# Patient Record
Sex: Female | Born: 1935
Health system: Southern US, Community
[De-identification: ages and names within clinical notes are randomized; demographics above are authoritative.]

## PROBLEM LIST (undated history)

## (undated) DIAGNOSIS — C50412 Malignant neoplasm of upper-outer quadrant of left female breast: Secondary | ICD-10-CM

## (undated) DIAGNOSIS — C50919 Malignant neoplasm of unspecified site of unspecified female breast: Secondary | ICD-10-CM

## (undated) DIAGNOSIS — Z923 Personal history of irradiation: Secondary | ICD-10-CM

## (undated) DIAGNOSIS — H3552 Pigmentary retinal dystrophy: Secondary | ICD-10-CM

## (undated) DIAGNOSIS — I2510 Atherosclerotic heart disease of native coronary artery without angina pectoris: Secondary | ICD-10-CM

## (undated) DIAGNOSIS — R7303 Prediabetes: Secondary | ICD-10-CM

## (undated) DIAGNOSIS — Z951 Presence of aortocoronary bypass graft: Secondary | ICD-10-CM

## (undated) DIAGNOSIS — I341 Nonrheumatic mitral (valve) prolapse: Secondary | ICD-10-CM

## (undated) DIAGNOSIS — H548 Legal blindness, as defined in USA: Secondary | ICD-10-CM

## (undated) DIAGNOSIS — C801 Malignant (primary) neoplasm, unspecified: Secondary | ICD-10-CM

## (undated) DIAGNOSIS — E785 Hyperlipidemia, unspecified: Secondary | ICD-10-CM

## (undated) DIAGNOSIS — I10 Essential (primary) hypertension: Secondary | ICD-10-CM

## (undated) HISTORY — DX: Nonrheumatic mitral (valve) prolapse: I34.1

## (undated) HISTORY — DX: Pigmentary retinal dystrophy: H35.52

## (undated) HISTORY — DX: Essential (primary) hypertension: I10

## (undated) HISTORY — DX: Hyperlipidemia, unspecified: E78.5

## (undated) HISTORY — DX: Personal history of irradiation: Z92.3

## (undated) HISTORY — DX: Presence of aortocoronary bypass graft: Z95.1

## (undated) HISTORY — PX: BREAST SURGERY: SHX581

## (undated) HISTORY — DX: Malignant neoplasm of upper-outer quadrant of left female breast: C50.412

## (undated) HISTORY — DX: Atherosclerotic heart disease of native coronary artery without angina pectoris: I25.10

## (undated) HISTORY — PX: CARDIAC SURGERY: SHX584

## (undated) HISTORY — PX: DOPPLER ECHOCARDIOGRAPHY: SHX263

## (undated) HISTORY — PX: BRAIN SURGERY: SHX531

## (undated) HISTORY — PX: TRANSTHORACIC ECHOCARDIOGRAM: SHX275

---

## 2002-04-27 DIAGNOSIS — Z951 Presence of aortocoronary bypass graft: Secondary | ICD-10-CM

## 2002-04-27 DIAGNOSIS — I2510 Atherosclerotic heart disease of native coronary artery without angina pectoris: Secondary | ICD-10-CM

## 2002-04-27 HISTORY — DX: Presence of aortocoronary bypass graft: Z95.1

## 2002-04-27 HISTORY — DX: Atherosclerotic heart disease of native coronary artery without angina pectoris: I25.10

## 2002-07-21 HISTORY — PX: CORONARY ARTERY BYPASS GRAFT: SHX141

## 2003-08-28 ENCOUNTER — Emergency Department (HOSPITAL_COMMUNITY): Admission: EM | Admit: 2003-08-28 | Discharge: 2003-08-28 | Payer: Self-pay | Admitting: *Deleted

## 2003-10-04 ENCOUNTER — Emergency Department (HOSPITAL_COMMUNITY): Admission: EM | Admit: 2003-10-04 | Discharge: 2003-10-04 | Payer: Self-pay | Admitting: Emergency Medicine

## 2004-02-13 ENCOUNTER — Encounter: Admission: RE | Admit: 2004-02-13 | Discharge: 2004-02-13 | Payer: Self-pay | Admitting: Internal Medicine

## 2004-05-04 ENCOUNTER — Inpatient Hospital Stay (HOSPITAL_COMMUNITY): Admission: EM | Admit: 2004-05-04 | Discharge: 2004-05-05 | Payer: Self-pay | Admitting: Emergency Medicine

## 2004-09-26 ENCOUNTER — Encounter: Admission: RE | Admit: 2004-09-26 | Discharge: 2004-09-26 | Payer: Self-pay | Admitting: Internal Medicine

## 2004-10-01 ENCOUNTER — Encounter: Admission: RE | Admit: 2004-10-01 | Discharge: 2004-10-01 | Payer: Self-pay | Admitting: Internal Medicine

## 2004-10-03 ENCOUNTER — Encounter: Admission: RE | Admit: 2004-10-03 | Discharge: 2004-10-03 | Payer: Self-pay | Admitting: Otolaryngology

## 2005-01-12 ENCOUNTER — Encounter: Admission: RE | Admit: 2005-01-12 | Discharge: 2005-01-12 | Payer: Self-pay | Admitting: Surgery

## 2005-01-14 ENCOUNTER — Ambulatory Visit (HOSPITAL_BASED_OUTPATIENT_CLINIC_OR_DEPARTMENT_OTHER): Admission: RE | Admit: 2005-01-14 | Discharge: 2005-01-14 | Payer: Self-pay | Admitting: Surgery

## 2005-01-14 ENCOUNTER — Ambulatory Visit (HOSPITAL_COMMUNITY): Admission: RE | Admit: 2005-01-14 | Discharge: 2005-01-14 | Payer: Self-pay | Admitting: Surgery

## 2005-01-14 ENCOUNTER — Encounter (INDEPENDENT_AMBULATORY_CARE_PROVIDER_SITE_OTHER): Payer: Self-pay | Admitting: Specialist

## 2005-01-26 ENCOUNTER — Inpatient Hospital Stay (HOSPITAL_COMMUNITY): Admission: EM | Admit: 2005-01-26 | Discharge: 2005-01-31 | Payer: Self-pay | Admitting: Emergency Medicine

## 2005-01-29 ENCOUNTER — Encounter (INDEPENDENT_AMBULATORY_CARE_PROVIDER_SITE_OTHER): Payer: Self-pay | Admitting: Specialist

## 2005-02-05 HISTORY — PX: CHOLECYSTECTOMY: SHX55

## 2005-04-30 ENCOUNTER — Ambulatory Visit: Payer: Self-pay | Admitting: Internal Medicine

## 2005-05-26 ENCOUNTER — Encounter: Admission: RE | Admit: 2005-05-26 | Discharge: 2005-08-24 | Payer: Self-pay | Admitting: Internal Medicine

## 2005-09-14 ENCOUNTER — Encounter: Admission: RE | Admit: 2005-09-14 | Discharge: 2005-09-14 | Payer: Self-pay | Admitting: Internal Medicine

## 2005-10-22 ENCOUNTER — Encounter: Admission: RE | Admit: 2005-10-22 | Discharge: 2005-10-22 | Payer: Self-pay | Admitting: Internal Medicine

## 2006-01-06 ENCOUNTER — Encounter: Admission: RE | Admit: 2006-01-06 | Discharge: 2006-01-06 | Payer: Self-pay | Admitting: Internal Medicine

## 2006-01-08 ENCOUNTER — Ambulatory Visit: Payer: Self-pay | Admitting: Internal Medicine

## 2006-01-21 ENCOUNTER — Encounter: Admission: RE | Admit: 2006-01-21 | Discharge: 2006-01-21 | Payer: Self-pay | Admitting: Internal Medicine

## 2006-02-19 ENCOUNTER — Ambulatory Visit: Payer: Self-pay | Admitting: Internal Medicine

## 2006-03-11 ENCOUNTER — Observation Stay (HOSPITAL_COMMUNITY): Admission: EM | Admit: 2006-03-11 | Discharge: 2006-03-12 | Payer: Self-pay | Admitting: Emergency Medicine

## 2006-03-11 ENCOUNTER — Encounter (INDEPENDENT_AMBULATORY_CARE_PROVIDER_SITE_OTHER): Payer: Self-pay | Admitting: *Deleted

## 2006-11-19 ENCOUNTER — Encounter (INDEPENDENT_AMBULATORY_CARE_PROVIDER_SITE_OTHER): Payer: Self-pay | Admitting: *Deleted

## 2006-11-19 ENCOUNTER — Ambulatory Visit (HOSPITAL_COMMUNITY): Admission: RE | Admit: 2006-11-19 | Discharge: 2006-11-19 | Payer: Self-pay | Admitting: *Deleted

## 2007-02-16 ENCOUNTER — Encounter: Admission: RE | Admit: 2007-02-16 | Discharge: 2007-02-16 | Payer: Self-pay | Admitting: Internal Medicine

## 2007-02-22 ENCOUNTER — Encounter: Admission: RE | Admit: 2007-02-22 | Discharge: 2007-02-22 | Payer: Self-pay | Admitting: Internal Medicine

## 2008-08-07 ENCOUNTER — Ambulatory Visit (HOSPITAL_COMMUNITY): Admission: RE | Admit: 2008-08-07 | Discharge: 2008-08-07 | Payer: Self-pay | Admitting: *Deleted

## 2009-03-29 ENCOUNTER — Encounter: Admission: RE | Admit: 2009-03-29 | Discharge: 2009-03-29 | Payer: Self-pay | Admitting: Internal Medicine

## 2009-04-27 HISTORY — PX: NM MYOCAR PERF WALL MOTION: HXRAD629

## 2009-05-08 ENCOUNTER — Encounter: Admission: RE | Admit: 2009-05-08 | Discharge: 2009-05-08 | Payer: Self-pay | Admitting: Cardiology

## 2009-05-22 ENCOUNTER — Ambulatory Visit (HOSPITAL_COMMUNITY): Admission: RE | Admit: 2009-05-22 | Discharge: 2009-05-22 | Payer: Self-pay | Admitting: Cardiology

## 2009-05-22 HISTORY — PX: CARDIAC CATHETERIZATION: SHX172

## 2010-05-18 ENCOUNTER — Encounter: Payer: Self-pay | Admitting: Internal Medicine

## 2010-05-18 ENCOUNTER — Encounter: Payer: Self-pay | Admitting: Cardiology

## 2010-09-09 NOTE — Op Note (Signed)
NAMEALIZZA, SACRA                 ACCOUNT NO.:  [id number]   MEDICAL RECORD NO.:  [id number]          PATIENT TYPE:  AMB   LOCATION:  ENDO                         FACILITY:  [facility]   PHYSICIAN:  Georgiana Spinner, M.D.    DATE OF BIRTH:  1932-08-15   DATE OF PROCEDURE:  08/07/2008  DATE OF DISCHARGE:                               OPERATIVE REPORT   PROCEDURE:  Colonoscopy.   INDICATIONS:  Constipation, colon cancer screening.   ANESTHESIA:  Fentanyl 100 mcg, Versed 10 mg.   PROCEDURE:  With the patient mildly sedated in the left lateral  decubitus position, the Pentax videoscopic pediatric colonoscope was  inserted in the rectum and passed under direct vision to the cecum,  identified by ileocecal valve and appendiceal orifice both of which were  photographed.  From this point colonoscope was slowly withdrawn taking  circumferential views of colonic mucosa stopping in the rectum which  appeared normal on direct and showed hemorrhoids on retroflexed view.  The endoscope was straightened and withdrawn.  The patient's vital  signs, pulse oximeter remained stable.  The patient tolerated procedure  well without apparent complications.   FINDINGS:  Internal hemorrhoids otherwise unremarkable colonoscopic  examination to cecum.   PLAN:  Consider repeat examination in 5-10 years.           ______________________________  Georgiana Spinner, M.D.     GMO/MEDQ  D:  08/07/2008  T:  08/07/2008  Job:  [id number]

## 2010-09-09 NOTE — Op Note (Signed)
NAMEFELICITE, Olivia Gray                 ACCOUNT NO.:  [id number]   MEDICAL RECORD NO.:  [id number]          PATIENT TYPE:  AMB   LOCATION:  ENDO                         FACILITY:  [facility]   PHYSICIAN:  Georgiana Spinner, M.D.    DATE OF BIRTH:  1932/09/03   DATE OF PROCEDURE:  11/19/2006  DATE OF DISCHARGE:                               OPERATIVE REPORT   PROCEDURE:  Upper endoscopy with biopsy.   INDICATIONS:  Gastroesophageal reflux disease.   ANESTHESIA:  Fentanyl 50 mcg, Versed 4 mg.   PROCEDURE IN DETAIL:  With the patient mildly sedated in the left  lateral decubitus position, the Pentax videoscopic endoscope was  inserted in the mouth and passed under direct vision through the  esophagus which appeared normal except at the squamocolumnar junction  there was an area of esophagitis, small, that was photographed and  biopsied.  We entered into stomach.  The fundus, body, antrum, duodenal  bulb, and second portion of the duodenum were visualized.  From this  point, the endoscope was slowly withdrawn taking circumferential views  of the duodenal mucosa until the endoscope had been pulled back in the  stomach and placed in retroflexion to view the stomach from below and an  incomplete wrap of the GE junction was seen.  The endoscope was  straightened and withdrawn taking circumferential views of the remaining  gastric and esophageal mucosa.  The patient's vital signs and pulse  oximeter remained stable.  The patient tolerated the procedure well  without apparent complications.   FINDINGS:  Loose wrap of the GE junction indicating laxity of the lower  esophageal sphincter and a small amount of esophagitis, biopsied.  Await  biopsy report.  The patient will call me for results and follow-up with  me as an outpatient.           ______________________________  Georgiana Spinner, M.D.     GMO/MEDQ  D:  11/19/2006  T:  11/19/2006  Job:  [id number]

## 2010-09-12 NOTE — Consult Note (Signed)
NAMEALLEXUS, OVENS                 ACCOUNT NO.:  [id number]   MEDICAL RECORD NO.:  [id number]          PATIENT TYPE:  INP   LOCATION:  1830                         FACILITY:  [facility]   PHYSICIAN:  Shirley Friar, MDDATE OF BIRTH:  October 01, 1932   DATE OF CONSULTATION:  01/26/2005  DATE OF DISCHARGE:                                   CONSULTATION   REFERRING PHYSICIAN:  Dr. Brien Few   REASON FOR CONSULT:  Acute pancreatitis.   HISTORY OF PRESENT ILLNESS:  The patient is a 77 year old black female who  has past medical history of hypertension, hyperlipidemia, coronary artery  disease, who was in her usual state of health until last Wednesday when she  reports having acute, sharp, generalized abdominal pain with associated  nausea and vomiting. She states that the pain would at times come and go and  at other times be severe and constant. She vomited several times that  initial day, and then yesterday she vomited five times approximately 2 hours  after eating a small amount of fluid. She reports going to her regular  doctor last week and being placed on Milk of Magnesia. She had no  improvement in her pain and came into the emergency room last night. She  denies hematemesis, melena, hematochezia, coffee-ground emesis, fevers, or  diarrhea. She denies ever having this type of pain before and denies family  history of pancreatitis.   In the emergency room, she was afebrile on presentation with white blood  count of 9. Her total bilirubin was 3.9, ALP 523, AST 923, and ALT 927, with  an amylase of 2465. She had a CAT scan done with preliminary report showing  mild pancreatitis and gallbladder wall thickening without ductal dilatation.  The patient denies previous history of gallstones and she denies alcohol  use. Also, the patient denies any new medicines except being placed on a  pain medicine which she is not sure of the name of.   PAST MEDICAL HISTORY:  1.  Coronary artery disease.  2.   Hyperlipidemia.  3.  Hypertension.  4.  Status post coronary artery bypass graft in 2004.  5.  History of blindness.  6.  Glaucoma and retinitis pigmentosa.  7.  History of internal carotid stenosis.  8.  History of an intraductal papilloma of the breast removed in 2006.   MEDICINES:  1.  Aspirin 325 mg daily.  2.  Lisinopril 20 mg daily.  3.  Zocor 40 mg daily.  4.  Cartia XT.  5.  Cosopt eyedrops.  6.  Alphagan eyedrops.  7.  Pain medicines (unsure of name).  8.  Vitamin A daily.   ALLERGIES:  No known drug allergies.   FAMILY HISTORY:  Denies family history of pancreatitis.   SOCIAL HISTORY:  Denies tobacco, alcohol, or drugs. Married.   PHYSICAL EXAMINATION:  VITAL SIGNS:  Temperature 98, pulse 69, blood  pressure 141/68, O2 saturation is 96% on room air.  GENERAL:  Alert and oriented, no acute distress, pleasant.  HEENT:  Nonicteric sclerae, oropharynx clear.  CHEST:  Clear to auscultation bilaterally.  CARDIOVASCULAR:  Regular rate and rhythm without murmurs.  ABDOMEN:  Epigastric tenderness and right upper quadrant; otherwise,  nontender, positive guarding in epigastric region, soft, nondistended,  positive bowel sounds.  EXTREMITIES:  No edema.   LABORATORY DATA:  CBC:  White blood count 9.2, hemoglobin 12.9, hematocrit  37.9, platelet count 252. INR 1.0. Sodium 139, potassium 3.6, chloride 106,  CO2 24, BUN 7, creatinine 1.0, glucose 190. Total bilirubin 3.9, ALP 523,  AST 923, ALT 927, total protein 7.5, calcium 3.3, amylase 2465, lipase 3534.  Cardiac enzymes:  Troponin 0.06, CK 66, CK-MB 1.   ASSESSMENT:  A 77 year old black female with past medical history as stated  above presents with acute pancreatitis that is concerning for gallstone  and/or biliary tract pancreatitis. She had two Ranson's criteria on  admission with her age - being over 3, and AST being greater than 250  consistent with mild pancreatitis. She is currently hemodynamically stable.   Other etiologies for this acute pancreatitis include hyperlipidemia,  viruses, drugs (medicines, especially she could have had this from the pain  medicines that she was on since NSAIDs have been associated with acute  pancreatitis; unsure at this time which medicine she was on, she is not  aware of the names), genetic mutations although this would be rare, viruses.  Pancreatic divisum is also a possibility. In order to further evaluate her  pancreatitis, I would recommend an MRCP to evaluate for CBD stones since  this is less invasive compared to ERCP to look for CBD stones, and if CBD  stones are present then we may need to consider earlier ERCP. Also, she  needs a dedicated right upper quadrant ultrasound to evaluate her  gallbladder for stones to help with further management and possible surgery  of her gallbladder. In the meantime, I recommend the following:  1.  Aggressive hydration and bowel rest.  2.  MRCP to evaluate for CBD stones as stated above.  3.  Right upper quadrant ultrasound to evaluate for cholelithiasis since it      is highly likely that she has gallstones and ultrasound is a better test      to look for this over the CAT scan. Would recommend this at some point      during this hospitalization.  4.  Check lipids fasting.  5.  Obtain complete medical history in terms of the pain medicines she      reports being placed on.  6.  Genetic mutations such as the SPINK gene has been associated with severe      acute pancreatitis, although would do this as a diagnosis of exclusion      if all other etiologies are negative and if she has a recurrent episode.   Thank you for this consult and please do not hesitate to call me with any  additional questions, and we will follow closely along with you.      Shirley Friar, MD  Electronically Signed     VCS/MEDQ  D:  01/26/2005  T:  01/26/2005  Job:  [id number]

## 2010-09-12 NOTE — H&P (Signed)
Olivia Gray, Olivia Gray                 ACCOUNT NO.:  [id number]   MEDICAL RECORD NO.:  [id number]          PATIENT TYPE:  INP   LOCATION:  1828                         FACILITY:  [facility]   PHYSICIAN:  Danae Chen, M.D.DATE OF BIRTH:  January 21, 1933   DATE OF ADMISSION:  05/04/2004  DATE OF DISCHARGE:                                HISTORY & PHYSICAL   CHIEF COMPLAINT:  Chest pain.   HISTORY OF PRESENT ILLNESS:  The patient is a very pleasant 77 year old  African-American female with a known history of coronary artery disease  status post bypass surgery in March of 2004 in [place], who presents  today with intermittent substernal chest pain, sharp in nature which began  at rest this afternoon. She did report that it radiated somewhat to her mid  back but did not to her arm or her jaw. Was not associated with nausea,  vomiting, or diaphoresis. The patient reports that when she initially had  her bypass, her symptoms prior to being worked up were fatigue and lethargy  and did not have any overt signs of chest pain that had prompted her  evaluation at that time. She does have high cholesterol but is not diabetic  and does not smoke. Family history is significant for a father that had a  myocardial infarction at age 81. She is currently pain free in the emergency  department. Initial set of enzymes are negative and there are no significant  electrocardiogram changes consistent with ischemia.   PAST MEDICAL HISTORY:  Hypertension, dyslipidemia, coronary artery disease  status post coronary artery bypass grafting in March 2004.   PAST SURGICAL HISTORY:  As above, coronary artery bypass grafting,  catheterization.   MEDICATIONS:  Zocor, baby aspirin, Lisinopril, Cardizem (uncertain of the  exact dosages, did not bring her medicine bottles with her).   ALLERGIES:  No known drug allergies.   REVIEW OF SYSTEMS:  Per the history of present illness. Also no recent  weight gain or  weight loss. No nausea, vomiting, diarrhea. No fever, chills,  cough, shortness of breath. Positive for chest pain.   FAMILY HISTORY:  Father with myocardial infarction and known coronary artery  disease at age 40.   SOCIAL HISTORY:  She does not smoke. No alcohol. No drugs. 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 She moved down here with her husband  from [place] about a year and a half ago. She has family in the area. She has  2 grown children.   PHYSICAL EXAMINATION:  VITAL SIGNS:  Temperature 97.8, blood pressure  144/73, pulse 72, respiratory rate 18. O2 saturation is 98% on room air.  HEENT:  Oropharynx is clear. Pupils are equal and reactive.  NECK:  Supple. There are no bruits appreciated. There is no jugular venous  distention.  LUNGS:  Clear to auscultation bilaterally.  HEART:  Rate is regular. Normal S1 and S2. No murmurs.  ABDOMEN:  Soft with active bowel sounds.  EXTREMITIES:  She has 2+ femoral pulses. 2+ dorsalis pedis pulses. She has  no peripheral edema. Extremities are  warm.  NEUROLOGIC:  No focal neurological deficits are noted. She is alert and  oriented.   LABORATORY DATA:  Sodium 141, potassium 3.7, chloride 114, BUN 10, glucose  84, hemoglobin 13.6, creatinine 0.9. Initial myoglobin, CK MB and troponin  first two sets are negative.   Portable chest x-ray showing minimal left lower lobe atelectasis. No edema.  No cardiomegaly.   Electrocardiogram:  No changes consistent with ischemia. No ST segment  elevation depression.   ASSESSMENT/PLAN:  A 77 year old African-American female with known coronary  artery disease who presents with atypical chest pain, not consistent with  angina but given her symptoms and risk factors, she will be admitted for  observation and further evaluation. I will continue with her cardiac enzymes  and place her on aspirin, beta blocker, oxygen, ace inhibitor and Lovenox.  Will continue her statin drug and also add a  proton pump inhibitor. Will go  ahead and schedule carotid Doppler's for in the morning, as I suspect in  talking with the patient, she had reported that Dr. Lucas Mallow, her cardiologist,  had ordered this but was unable to get it obtained because of some  difficulty with arranging it with the technician. I also suspect that she  will get a Cardiolite for further risk verification to evaluate for the  patency of her bypass. Cardiology has been called and are aware and will  come to see her in the morning. Further management per her clinical course.      RLK/MEDQ  D:  05/04/2004  T:  05/04/2004  Job:  [id number]   cc:   Juline Patch, M.D.  [address]  [place], [place] [address]  Fax: [phone]   Jaclyn Prime. Lucas Mallow, M.D.  [address]  [place]  [place] [address]  Fax: [phone]

## 2010-09-12 NOTE — H&P (Signed)
NAMEJELITZA, Olivia Gray                 ACCOUNT NO.:  [id number]   MEDICAL RECORD NO.:  [id number]          PATIENT TYPE:  INP   LOCATION:  1827                         FACILITY:  [facility]   PHYSICIAN:  Della Goo, M.D. DATE OF BIRTH:  [birth year]   DATE OF ADMISSION:  03/11/2006  DATE OF DISCHARGE:                                HISTORY & PHYSICAL   CHIEF COMPLAINT:  Chest pain.   HISTORY OF PRESENT ILLNESS:  This is a 77 year old female with a history of  coronary artery disease status post CABG x5 in 2004 who also has a history  of type 2 diabetes mellitus who presented to the emergency department  secondary to complaints of intermittent substernal chest pain off and on for  the past few days.  The patient reports the pain was lasting approximately a  few minutes and recurring every few hours.  The pain was described as being  a tightness in her chest, substernal in location, rated at an intensity of  6/10, nonradiating, and not associated with symptoms of nausea, vomiting or  diaphoresis or shortness of breath.  The patient did report the pain did  improve with changes in her position.   PAST MEDICAL HISTORY:  1. Coronary artery disease.  2. Coronary coronary artery bypass grafting x5.  3. Diabetes type 2.  4. Hyperlipidemia.  5. Hypertension.  6. Legal blindness secondary to retinitis pigmentosum.   MEDICATIONS:  The dosages need to be verified.  1. Lisinopril 20 mg one p.o. daily.  2. Simvastatin 40 mg one p.o. daily.  3. Cartia XT 180 mg one p.o. daily.  4. Metformin, dose to be verified.  5. Travatan eye drops, dose to be verified and interval      dosing/administration.  6. The patient was previously on Reglan; this has been discontinued.   ALLERGIES:  NO KNOWN DRUG ALLERGIES.   SOCIAL HISTORY:  Married, nonsmoker, nondrinker.  The patient is also a  [employer] and will not take blood or blood products.   REVIEW OF SYSTEMS:  Pertinents are mentioned  above.   PHYSICAL EXAMINATION:  GENERAL:  A pleasant, pleasant 77 year old well-  nourished, well-developed female in no acute distress.  VITAL SIGNS:  Temperature 98.1, blood pressure 130/68, heart rate 57-68 and  respirations 20-22.  O2 saturation is 99-100%.  HEENT:  Normocephalic and atraumatic.  Pupils are equally round and reactive  to light.  Extraocular muscles are intact.  Oropharynx is clear.  NECK:  Supple.  Full range of motion.  No thyromegaly, adenopathy, or  jugular venous distension.  CARDIOVASCULAR:  Regular rate and rhythm.  No murmurs, gallops or rubs.  LUNGS:  Clear to auscultation bilaterally.  ABDOMEN:  Positive bowel sounds, soft, nontender, nondistended.  EXTREMITIES:  Without cyanosis, clubbing or edema.  NEUROLOGIC:  Grossly nonfocal except for the visual deficits.  GENITOURINARY/RECTAL:  Deferred.   LABORATORY STUDIES:  White blood cell count 5.6, hemoglobin 12.3, hematocrit  36.2, platelets 234, neutrophils 48%, lymphocytes 38%.  Sodium 141,  potassium 3.9, chloride 111, BUN 12, creatinine 1.0, bicarbonate 25.8 and  glucose 100.  Initial cardiac enzymes revealed myoglobin of 75.7, CK-MB 1.2,  troponin less than 0.01.   ELECTROCARDIOGRAM:  Normal sinus rhythm with no acute ST-segment changes.   CHEST X-RAY:  No acute disease.   ASSESSMENT:  A 77 year old female with a history of coronary artery disease  and type 2 diabetes mellitus admitted with complaints of chest pain.   ADMITTING DIAGNOSES:  1. Acute chest pain syndrome.  2. Hypertension.  3. Hyperlipidemia.  4. Type 2 diabetes mellitus.  5. History of retinitis pigmentosum and legal blindness.   PLAN:  The patient will be admitted to a telemetry area for a cardiac rule  out.  She will be monitored for cardiac changes.  She will be placed on beta  blocker therapy, nitrates, oxygen, aspirin, ACE inhibitor and Lovenox  therapy.  She will also be placed on GI prophylaxis.  The patient's  metformin  and Travatan medications need dosage verification.  The patient's  regular physician is Dr. Ricki Miller and cardiologist is Dr. Lucas Mallow.      Della Goo, M.D.  Electronically Signed     HJ/MEDQ  D:  03/11/2006  T:  03/11/2006  Job:  [id number]   cc:   Juline Patch, M.D.  Jaclyn Prime. Lucas Mallow, M.D.

## 2010-09-12 NOTE — H&P (Signed)
NAMEFABIHA, Olivia Gray                 ACCOUNT NO.:  [id number]   MEDICAL RECORD NO.:  [id number]          PATIENT TYPE:  INP   LOCATION:  1830                         FACILITY:  [facility]   PHYSICIAN:  Isidor Holts, M.D.  DATE OF BIRTH:  1933-02-15   DATE OF ADMISSION:  01/25/2005  DATE OF DISCHARGE:                                HISTORY & PHYSICAL   PRIMARY CARE PHYSICIAN:  Juline Patch, M.D.   CHIEF COMPLAINT:  Abdominal pain/vomiting for 5 days.   HISTORY OF PRESENT ILLNESS:  This is a 76 year old female.  For past medical  history, see below.  She developed upper abdominal pain on January 21, 2005.  There were no obvious precipitants, and she vomited x2 on that day.  Since then, abdominal pain has been severe, persistent, and fluctuating.  No  appetite.  Denies fever.  Went to see her PMD on January 22, 2005, had an  abdominal x-ray and was prescribed Milk of Magnesia.  She had frequent  stools on January 23, 2005, and on January 24, 2005.  There was no  improvement in pain.  She vomited x4 on January 25, 2005.  The vomitus  contained food debris.  No coffee grounds, no hematemesis.  Spouse called  emergency medical services.   PAST MEDICAL HISTORY:  1.  Hypertension.  2.  Dyslipidemia.  3.  Coronary artery disease; status post CABG on March 2004.  4.  Glaucoma/retinitis pigmentosa/bilateral blindness.  5.  Left 40% to 60% internal carotid artery stenosis confirmed by Doppler on      April 28, 2004.  6.  Status post bilateral ductal excision, January 14, 2005, by Currie Paris, M.D. for intraduct papilloma.   MEDICATIONS:  1.  Aspirin 325 mg p.o. daily.  2.  Lisinopril 20 mg p.o. daily.  3.  Zocor 40 mg p.o. daily.  4.  Vitamin A 1600 International Units daily.  5.  Cartia XT. Query dosage.  6.  Nutraview.  7.  Cosopt eye drops, one drop each eye b.i.d.  8.  Travatan eye drops, one drop each eye nightly.  9.  Alphagan eye drops, one drop each eye  b.i.d.  10. Unspecified pain medications.   ALLERGIES:  No known drug allergies.   REVIEW OF SYSTEMS:  As in HPI/Chief complaint, otherwise negative.   FAMILY HISTORY:  Noncontributary.   SOCIAL HISTORY:  The patient is married.  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  She  is a nonsmoker, nondrinker, and has no history of drug abuse.  She has two  grown children.   PHYSICAL EXAMINATION:  VITAL SIGNS:  Temperature 97.9, pulse 69 per minute  and regular, respiratory rate 16, blood pressure initially 152/70 mmHg,  rechecked 141/68 mmHg.  Pulse oximetry 96% on room air.  GENERAL:  The patient does not appear to be in obvious acute distress, after  Dilaudid was administered in the emergency department.  She is not short of  breath at rest.  She is alert  and communicative.  HEENT:  No frank pallor, no jaundice.  No conjunctival injection.  She does  appear to have early cataracts bilaterally.  Throat appears quite clear.  NECK:  Supple, JVP not seen.  No palpable lymphadenopathy.  No palpable  goiter.  CHEST:  Clear to auscultation, no wheezes and no crackles.  HEART:  Sounds 1 and 2 heard, normal regular, no murmurs.  Median sternotomy  scar is noted.  BREASTS:  Reveals healed irregular scars.  There is no discharge or redness.  ABDOMEN:  Mildly obese, soft, there is right upper quadrant and epigastric  tenderness, no guarding.  No palpable organomegaly.  Normal bowel sounds.  EXTREMITIES:  No pitting edema, palpable peripheral pulses.  MUSCULOSKELETAL:  Quite unremarkable.  NEUROLOGY:  No focal neurologic deficit on gross examination.   LABORATORY DATA:  CBC; white blood cell count 9.3, hemoglobin 12.9,  hematocrit 37.9, platelets 252.  Electrolytes; sodium 139, potassium 3.6,  chloride 106, CO2 24, BUN 7, creatinine 1.0, glucose 190, AST 923, ALT 927,  alkaline phosphatase 523, total bilirubin 3.9.  Urinalysis is  negative.  Amylase is 2465.   Chest x-ray dated January 25, 2005; no acute findings, normal bowel gas  pattern, no free air.   CT of abdomen/pelvis, January 25, 2005; mild pancreatitis.  There is  gallbladder wall thickening, indeterminate 1.8 x 2.4 cm right adrenal  lesion.   ASSESSMENT:  1.  Acute pancreatitis.  The patient has very high amylase levels.  We shall      therefore admit her to stepdown unit, commence bowel rest, manage with      intravenous fluid hydration, proton pump inhibitor, analgesics, and      antiemetics.  We shall also follow amylase, lipase levels, and LFTs.      The patient has significantly elevated transaminases and alkaline      phosphatase raising the possibility of biliary calculi.  However, there      is no ductal dilatation on abdominal CT scan.  We shall check fasting      lipid levels, hold her statins for the moment, and invite GI input.   1.  Hypertension. Suboptimal control.  We shall manage with Catapres TTS      patch for now, since the patient is going to be NPO.   1.  History of coronary artery disease.  She is asymptomatic from this      viewpoint.  We shall cycle cardiac enzymes for completeness.  Further management will depend on the clinical course.      Isidor Holts, M.D.  Electronically Signed     CO/MEDQ  D:  01/26/2005  T:  01/26/2005  Job:  [id number]   cc:   Juline Patch, M.D.  Fax: [phone]

## 2010-09-12 NOTE — Op Note (Signed)
Olivia Gray, Olivia Gray                 ACCOUNT NO.:  [id number]   MEDICAL RECORD NO.:  [id number]          PATIENT TYPE:  INP   LOCATION:  4734                         FACILITY:  [facility]   PHYSICIAN:  Velora Heckler, MD      DATE OF BIRTH:  06/09/1932   DATE OF PROCEDURE:  01/29/2005  DATE OF DISCHARGE:                                 OPERATIVE REPORT   PREOPERATIVE DIAGNOSIS:  Biliary pancreatitis.   POSTOPERATIVE DIAGNOSES:  1.  Acute cholecystitis.  2.  Cholelithiasis.  3.  Biliary pancreatitis.   PROCEDURE:  Laparoscopic cholecystectomy.   SURGEON:  Velora Heckler, MD   ASSISTANT:  Consuello Bossier, MD   ANESTHESIA:  General.   ESTIMATED BLOOD LOSS:  100 mL   PREPARATION:  Betadine.   COMPLICATIONS:  None.   DRAINS:  A #19 Blake drain to bulb suction.   INDICATIONS:  The patient is a 77 year old black female admitted on the  medical service for abdominal pain, nausea and vomiting.  Laboratory studies  showed markedly elevated liver function test and an elevated serum amylase  of 2465.  CT scan of the abdomen and pelvis demonstrated pancreatitis,  gallbladder wall thickening.  The patient was admitted on the medical  service and seen in consultation by Gastroenterology.  The patient's liver  function test and amylase appear to be improving.  Abdominal pain was no  worse.  She was clinically stable.  General surgery was consulted on the  third hospital day.  The patient was seen and evaluated.  The liver function  tests and amylase continued to improve, and the patient's pain became less.  Therefore, she was prepared and brought to the operating room for  cholecystectomy for presumed biliary pancreatitis.   BODY OF REPORT:  Procedure was done in OR #17 at the [facility].  The patient was brought to the operating room and placed in a  supine position on the operating room table.  Following administration of  general anesthesia, the patient was prepped  and draped in the usual strict  aseptic fashion.  After ascertaining that an adequate level of anesthesia  had been obtained, an infraumbilical incision was made with a #15 blade.  Dissection was carried down to the fascia.  Fascia was incised in the  midline and the peritoneal cavity was entered cautiously.  A 0 Vicryl  pursestring suture was placed in the fascia.  An Hasson cannula was  introduced and secured with a pursestring suture.  The abdomen was  insufflated with carbon dioxide.  A laparoscope was introduced and the  abdomen explored.  Operative ports were placed along the right costal margin  in the midline, midclavicular line, and anterior axillary line.  Gallbladder  was exposed.  Gallbladder was markedly intrahepatic.  It is thick-walled,  edematous and indurated, consistent with acute cholecystitis.  There are  dense adhesions to the undersurface of the gallbladder which are taken down  with gentle blunt dissection.  Dissection was begun at the neck of the  gallbladder.  Cystic duct is dissected out along  its length.  A clip is  placed at the neck of the gallbladder.  Cystic duct is quite thick-walled,  inflamed and friable.  Decision is made not to proceed with cholangiography.  The distal cystic duct is doubly clipped and divided with the scissors.  The  cystic artery is controlled with double Ligaclips and divided.  Gallbladder  is then excised from the gallbladder bed using the hook electrocautery for  hemostasis.  A posterior duct of Luschka is identified and clipped with a  Ligaclip.  It is then divided with the electrocautery.  Gallbladder is  markedly intrahepatic.  This requires dissection involving the parenchyma of  the liver.  Cautery was used for hemostasis.  Gallbladder is completely  excised and placed into an EndoCatch bag.  Right upper quadrant is copiously  irrigated with warm saline and good hemostasis is obtained in the  gallbladder bed with electrocautery.   Surgicel is placed in the gallbladder  bed.  A 19-French Blake drain is brought in from the lateral stab wound and  placed beneath the gallbladder bed for drainage.  It is secured to the skin  with a 3-0 nylon suture.  Abdomen is again irrigated and evacuated.  Good  hemostasis is noted.  Ports are removed under direct vision and good  hemostasis is noted at all port sites.  Gallbladder is removed through the  umbilical port without difficulty and the 0 Vicryl pursestring suture tied  securely.  Pneumoperitoneum is released.  Port sites are anesthetized with  local anesthetic.  Wounds are closed with interrupted 4-0 Vicryl  subcuticular sutures and wounds washed and dried and Benzoin and Steri-  Strips are applied and sterile dressings are applied.  The patient is  awakened from anesthesia and brought to the recovery room in stable  condition.  The patient tolerated the procedure well.      Velora Heckler, MD  Electronically Signed     TMG/MEDQ  D:  01/29/2005  T:  01/30/2005  Job:  [id number]   cc:   Juline Patch, M.D.  Fax: [phone]   Shirley Friar, MD  Fax: [phone]   Llana Aliment. Malon Kindle., M.D.  Fax: [phone]

## 2010-09-12 NOTE — Discharge Summary (Signed)
NAMEKADESHA, VIRRUETA                 ACCOUNT NO.:  [id number]   MEDICAL RECORD NO.:  [id number]          PATIENT TYPE:  INP   LOCATION:  4728                         FACILITY:  [facility]   PHYSICIAN:  Elliot Cousin, M.D.    DATE OF BIRTH:  February 26, 1933   DATE OF ADMISSION:  05/04/2004  DATE OF DISCHARGE:  05/05/2004                                 DISCHARGE SUMMARY   DISCHARGE DIAGNOSES:  1.  Substernal chest pain, thought to be noncardiac in origin.  2.  Left 40-60% internal carotid artery stenosis per carotid Dopplers on      May 05, 2004.   SECONDARY DISCHARGE DIAGNOSES:  1.  Coronary artery disease, status post CABG in March 2004.  2.  Hypertension.  3.  Dyslipidemia.  4.  Glaucoma.  5.  Legal blindness.   DISCHARGE MEDICATIONS:  1.  Zocor 40 mg daily.  2.  Aspirin 325 mg daily.  3.  Lisinopril 10 mg daily.  4.  Cardizem 180 mg daily.  5.  Vitamin A 1-2 tabs daily.  6.  Tritan eye drops 1 ggt. each eye daily.  7.  Cosopt eye drops 1 ggt. each eye b.i.d.   DISCHARGE DISPOSITION:  The patient was discharged to home in improved and  stable condition on May 05, 2004.  The patient was advised to follow up  with Dr. Lucas Mallow on Thursday or Friday of this week.   PROCEDURES PERFORMED:  Carotid Dopplers, May 05, 2004.  No  hemodynamically significant ICA stenosis on the right.  Vertebral artery  flow is antegrade on the right.  Left 40-60% ICA stenosis on the left.  Vertebral artery flow is antegrade.   HISTORY OF PRESENT ILLNESS:  The patient is a 77 year old lady with a past  medical history significant for coronary artery disease, status post CABG in  March 2004, in [place], who presented to the hospital on May 04, 2004, with substernal chest pain.  The pain was sharp in nature.  It  occurred at rest.  She did report that the pain radiated somewhat to her mid  back but not to her left arm or her jaw.  There was no associated nausea,  vomiting, or diaphoresis.   When she was evaluated in the emergency  department, she was chest pain free; however, given her past medical  history, she was admitted for further evaluation and management.   HOSPITAL COURSE:  #1 - SUBSTERNAL CHEST PAIN:  The patient was restarted on  her home medications which consisted of Zocor, aspirin, lisinopril,and  Cardizem.  Her EKG on admission revealed no acute changes.  Her chest x-ray  revealed minimal left lower lobe atelectasis.  There was no evidence of  edema or cardiomegaly.  The patient was started empirically on Protonix.  Cardiac enzymes were ordered q.8h. x 3.  The patient's initial cardiac  markers in the emergency department were completely negative.  The cardiac  panel/cardiac enzymes were also completely negative during the hospital  course.  The patient's CK ranged between 87 and 92, and the troponin I was  0.01.  Her TSH was within normal limits at 0.697.  A follow up EKG revealed  a completely normal sinus rhythm with no abnormalities.   During the hospitalization, the patient had no complaints of chest pain or  shortness of breath.  Her telemetry monitor revealed no arrhythmias and no  abnormalities.  The patient did explain that when the chest pain occurred,  she had just taken 3 Beano on an empty stomach.  Shortly afterwards, she  developed the chest pain.  The patient, however, has not had a stress test  since her CABG in March 2004.  The patient's disposition was discussed with  her cardiologist, Dr. Lucas Mallow.  Dr. Lucas Mallow was in agreement that the patient  needed a Cardiolite; however, it could be deferred to the outpatient  setting.  The patient was discharged to home given no symptoms, a normal  EKG, and normal cardiac enzymes.  The patient was advised to continue her  home medications.  The plan is to schedule the patient for an outpatient  Cardiolite study or stress test per Dr. Lucas Mallow.   #2 - LEFT CAROTID ARTERY STENOSIS:  The patient was evaluated for  carotid  bruits heard on exam.  Carotid Dopplers were ordered and revealed no ICA  stenosis on the right; however, there was a left 40-60% stenosis.  The  vertebral artery flow bilaterally was antegrade.  The patient was advised to  continue aspirin 325 mg daily.   DISCHARGE LABORATORY:  WBC 5.3, hemoglobin 11.5, hematocrit 33.8, platelets  208.  Sodium 141, potassium 3.5, chloride 112, CO2 24, glucose 169 repeated  at 92, BUN 12, creatinine 0.8, calcium 8.4, CK 87, CK-MB 1.7, troponin I  0.01, TSH 0.697.      Deni   DF/MEDQ  D:  05/05/2004  T:  05/05/2004  Job:  [id number]   cc:   Juline Patch, M.D.  [address]  [place], [place] [address]  Fax: [phone]   Jaclyn Prime. Lucas Mallow, M.D.  [address]  [place]  [place] [address]  Fax: [phone]

## 2010-09-12 NOTE — Discharge Summary (Signed)
Olivia Gray, Olivia Gray                 ACCOUNT NO.:  [id number]   MEDICAL RECORD NO.:  [id number]          PATIENT TYPE:  INP   LOCATION:  4734                         FACILITY:  [facility]   PHYSICIAN:  Velora Heckler, MD      DATE OF BIRTH:  1932/06/12   DATE OF ADMISSION:  01/25/2005  DATE OF DISCHARGE:  01/31/2005                                 DISCHARGE SUMMARY   REASON FOR ADMISSION:  Abdominal pain, nausea and vomiting.   HISTORY OF PRESENT ILLNESS:  Aneesha Holloran is a pleasant 77 year old black  female from [place], [place]; admitted on the [employer] [facility] for Dr. Ricki Miller on January 25, 2005 for abdominal pain,  nausea, vomiting and dehydration.  Workup revealed findings with acute  biliary pancreatitis.  The patient was seen in consultation by Dr. Shirley Friar from [facility].  She was also seen in consultation  by General Surgery.  The patient had a gradual clinical improvement with  improved abdominal pain, abdominal distention and laboratory studies.  The  patient was then prepared and taken to the operating room on January 29, 2005  for cholecystectomy.   HOSPITAL COURSE:  The patient was taken to the operating room on January 29, 2005.  She underwent laparoscopic cholecystectomy.  Postoperatively the  patient did well.  She had continued improvement in liver function tests.  Abdominal pain gradually resolved.  The patient was started on a clear  liquid diet and advanced to a low-fat diet, which she tolerated well.  She  was prepared for discharge home on the second postoperative day.   DISCHARGE PLANNING:  The patient is discharged home on January 31, 2005 in  good condition, tolerating a low-fat diet and ambulating with assistance.  The patient will be seen back at my office at [facility] in  two weeks.   DISCHARGE MEDICATIONS:  1.  Vicodin as needed for pain.  2.  Augmentin 875 mg b.i.d. for 5 days.   FINAL DIAGNOSIS:   Acute cholecystitis, cholelithiasis, biliary pancreatitis.   CONDITION ON DISCHARGE:  Improved.      Velora Heckler, MD  Electronically Signed     TMG/MEDQ  D:  01/31/2005  T:  01/31/2005  Job:  [id number]   cc:   Juline Patch, M.D.  Fax: [phone]

## 2010-09-12 NOTE — Assessment & Plan Note (Signed)
[facility]                               PULMONARY OFFICE NOTE   KESHAUNA, DEGRAFFENREID                          MRN:          [id number]  DATE:01/08/2006                            DOB:          06-Jul-1932    PROBLEM:  1. Rhinitis.   HISTORY:  She was last here in January and returns now with her husband,  still complaining of mucus from nose and chest.  Cosopt had irritated her  nose in retrospect, but she says she got little better after that was  stopped.  She does admit occasional reflux and gets some cough, but her  primary complaint remains her perception that she has an annoying watery  nose.  This failed to respond to Flonase, Astelin, or ipratropium.  She  never smoked.  Skin testing had been negative by Dr. Stevphen Rochester in the  past.  Her vision is very poor because of retinitis pigmentosa, but her  husband has not seen any blood or anything that looked purulent.  She is on  lisinopril, and we discussed cough as a side-effect.  She does not think  that is a problem.   MEDICATIONS:  1. Lisinopril 20 mg.  2. Cartia XT 180 mg.  3. Simvastatin 40 mg.  4. Ecotrin 325 mg.  5. Metformin.  6. Ipratropium 0.06% nasal spray, used occasionally.   NO MEDICATION ALLERGY.   OBJECTIVE:  VITAL SIGNS:  Weight 154 pounds, BP 122/80, pulse regular 76,  room air saturation 99%.  HEENT:  Conjunctivae were not injected.  Nasal mucosa looked unremarkable,  but she persisted in rubbing at the end of her nose with a Kleenex through  much of our interview.  I could not see anything external or internal to the  nose that looked uncomfortable, and her pharynx was clear.  There was no  adenopathy.  LUNGS:  Clear.  Heart sounds regular without murmur.  She did not cough  while she was with me.   IMPRESSION:  1. Complaints of nasal congestion and sneezing.  I do not know if this      would be a self-inflicted mechanical irritation by now, or whether she  really is reacting to some stimulus.  We talked about finding a      different chemistry to dry it up some, and I suggested a short trial of      Reglan, as her primary complaint is not itching but drip, unresponsive      to antihistamines, nasal steroids, and the ipratropium.  2. Occasional cough.  There are real possibilities that this might reflect      either her lisinopril or occasional reflux/LPR.  I suggested that they      involve Dr. Ricki Miller and his associates in that consideration.   PLAN:  1. Chest x-ray to exclude pulmonary basis for cough complaint.  2. Try Reglan 5 mg AC and at bedtime for impact on nasal complaints.  3. Schedule return in 6 weeks, earlier p.r.n.  Clinton D. Maple Hudson, MD, [facility], FACP   CDY/MedQ  DD:  01/09/2006  DT:  01/10/2006  Job #:  [id number]   cc:   Juline Patch, M.D.

## 2010-09-12 NOTE — Consult Note (Signed)
NAMESHENOA, HATTABAUGH                 ACCOUNT NO.:  [id number]   MEDICAL RECORD NO.:  [id number]          PATIENT TYPE:  INP   LOCATION:  4735                         FACILITY:  [facility]   PHYSICIAN:  Velora Heckler, MD      DATE OF BIRTH:  May 01, 1932   DATE OF CONSULTATION:  01/27/2005  DATE OF DISCHARGE:                                   CONSULTATION   Ms. Jacobs is a 77 year old female who is admitted on January 25, 2005 with a  five-day history of upper abdominal pain, nausea and vomiting.  She was  admitted on January 25, 2005 with pancreatitis, elevated LFTs and gallbladder  ultrasound revealed sludge and small gallstones as well as gallbladder wall  thickening.  The common bile duct was normal and this was confirmed not only  with ultrasound but with MRCP.  We are consulted regarding cholecystectomy.   Lab studies today showed a white count of 11.6, potassium of 3.3.  Her LFTs  are normalizing and her total bilirubin has decreased from 3.9 to 1.8.  Her  amylase and lipase remain elevated in the 700 range but were substantially  elevated on admission to greater than 2000 each.  The urinalysis is  negative.   PAST MEDICAL HISTORY:  1.  Coronary artery disease.  She underwent bypass surgery two years ago in      [place] but is now being followed by Dr. Tammy Sours in [place].  2.  She has had bilateral ductal excisions of the right breast by Dr. Cicero Duck on January 14, 2005 for intraductal papilloma.  3.  She has a history of dyslipidemia.  4.  Hypertension.  5.  Left ICA stenosis (40-60%).  6.  She is legally blind.  7.  She suffers from glaucoma and retinitis pigmentosa.   ALLERGIES:  NO KNOWN DRUG ALLERGIES.   FAMILY HISTORY:  Dad with coronary artery disease.   MEDICATIONS:  Alphagan, Rocephin, Catapres, Trusopt, Lovenox, Reglan,  Protonix, Timolol and Travatan.   SOCIAL HISTORY:  She is married.  She moved from [place] in February 2005.  She denies any tobacco,  alcohol or illicit drug use.  She has two children.   REVIEW OF SYSTEMS:  Otherwise negative.   PHYSICAL EXAMINATION:  T-max 101, blood pressure 150/82, pulse 81,  respirations 20, O2 saturation 97% on room air.  HEENT:  Grossly normal.  She is legally blind.  CHEST:  Clear to auscultation bilaterally.  HEART:  Regular rate and rhythm.  Soft 1/6 systolic murmur.  She does have a  well-healed midsternal scar from her bypass surgery.  ABDOMEN:  Soft, good bowel sounds.  She does have right upper quadrant  tenderness with some epigastric tenderness.  SKIN:  Warm and dry.  She is alert and oriented.  No lower extremity edema.   ASSESSMENT AND PLAN:  1.  Acute pancreatitis.  2.  Acute cholecystitis.  3.  Coronary artery disease.  4.  Dyslipidemia.  5.  Hypertension.  6.  Legally blind, glaucoma, retinitis pigmentosa.  7.  Leukocytosis secondary  to number one and number two.   At this point, the patient is being treated with IV fluids, IV pain  management, IV antibiotics.  She is being kept NPO.  Her LFTs, lipase and  amylase are trending downwards.  She will need a cholecystectomy during this  admission.  Will ask Dr. Gerrit Friends regarding timing with the operative  schedule.      Guy Franco, P.A.      Velora Heckler, MD  Electronically Signed    LB/MEDQ  D:  01/27/2005  T:  01/27/2005  Job:  [id number]   cc:   Jaclyn Prime. Lucas Mallow, M.D.  Fax: [phone]   Shirley Friar, MD  Fax: [phone]   Velora Heckler, MD  [phone] [place]  [place] [address]

## 2010-09-12 NOTE — Discharge Summary (Signed)
NAMEBARRY, CULVERHOUSE                 ACCOUNT NO.:  [id number]   MEDICAL RECORD NO.:  [id number]          PATIENT TYPE:  OBV   LOCATION:  6715                         FACILITY:  [facility]   PHYSICIAN:  Mobolaji B. Bakare, M.D.DATE OF BIRTH:  1932/11/25   DATE OF ADMISSION:  03/10/2006  DATE OF DISCHARGE:  03/12/2006                                 DISCHARGE SUMMARY   PRIMARY CARE PHYSICIAN:  Juline Patch, M.D.   CARDIOLOGIST:  Jaclyn Prime. Lucas Mallow, M.D.   FINAL DIAGNOSIS:  Chest pain, ruled out for myocardial infarction.   SECONDARY DIAGNOSES:  1. Coronary artery disease, status post bypass surgery in 2004.  2. Diabetes mellitus.  3. Hypertension.  4. Hyperlipidemia.  5. Legal blindness due to retinitis pigmentosa.   BRIEF HISTORY:  Ms. Langenderfer is a 77 year old African-American female with  known cardiac history.  She had bypass surgery in 2004.  She has diabetes  and hypertension and hyperlipidemia.  She presented with intermittent  substernal chest pain for some 3 days prior to hospitalization.  This pain  was reported as lasting very few minutes and occurring with no particular  aggravating factors.  It was substernal and nonradiating.  There was no  associated nausea and vomiting, shortness of breath or diaphoresis.  She was  seen in the emergency room with an initial EKG, which was nonspecific for  acute ischemia.  She was then admitted for further evaluation.  Cardiac  markers at the point of care were negative.   HOSPITAL COURSE:  Ms. Maret was admitted to telemetry.  She did not have any  further chest pain during the course of hospitalization.  She was placed on  cardiac medications and nitroglycerin patch.  The chest pain does not sound  typical for angina.  Nevertheless, the patient is diabetic and has multiple  risk factors.  She does have a history of CAD.  She was ruled out for  myocardial infarction with three negative sets of cardiac enzymes.  She will  need to follow up  with Dr. Lucas Mallow in the outpatient setting for a stress  test.   All other medical conditions were stable.  Blood glucose was well-controlled  during the course of hospitalization with continued previous home  medications.   DISCHARGE CONDITION:  Stable.   VITALS ON DISCHARGE:  Temperature 98.1, pulse of 78, respiratory rate of 18,  blood pressure 116/64 with O2 saturations of 98% on room air.   DISCHARGE MEDICATIONS:  1. Lisinopril 20 mg p.o. daily.  2. Zocor 40 mg p.o. daily.  3. Cartia XT 180 mg p.o. daily.  4. Metformin 500 mg p.o. daily.  5. Travatan 0.004% both eyes h.s.  6. Metoclopramide 5 mg p.o. daily.   FOLLOW-UP:  With Dr. Lucas Mallow in the office next Monday.   RECOMMENDATIONS:  Suggest consideration for possible stress test.   PERTINENT LABORATORY DATA:  TSH 1.333.  Sodium 142, potassium 4.0, chloride  110, CO2 26, glucose 92, BUN 8, creatinine of 0.9, calcium 9.8.  White cells  5.3, hemoglobin 13.3, hematocrit 38.4, MCV 89, platelets 242.  Mobolaji B. Corky Downs, M.D.  Electronically Signed     MBB/MEDQ  D:  03/12/2006  T:  03/12/2006  Job:  [id number]   cc:   Jaclyn Prime. Lucas Mallow, M.D.  Juline Patch, M.D.

## 2010-09-12 NOTE — Op Note (Signed)
NAMESHERIDAN, HEW                 ACCOUNT NO.:  [id number]   MEDICAL RECORD NO.:  [id number]          PATIENT TYPE:  AMB   LOCATION:  [facility]                         FACILITY:  [facility]   PHYSICIAN:  Currie Paris, M.D.DATE OF BIRTH:  1932/11/28   DATE OF PROCEDURE:  01/14/2005  DATE OF DISCHARGE:                                 OPERATIVE REPORT   PREOPERATIVE DIAGNOSES:  Bilateral nipple discharge with likely papillomas.   POSTOPERATIVE DIAGNOSES:  Bilateral nipple discharge with likely papillomas.   OPERATION:  Bilateral ductal excision.   SURGEON:  Dr. Jamey Ripa.   ANESTHESIA:  General.   CLINICAL HISTORY:  This is a 77 year old lady with spontaneous nipple  discharge bilaterally. Cannulation had shown markedly dilated ducts with  what looked like some filling defects in both sides. These appear to be  intraductal masses and we discussed excisional biopsy.   DESCRIPTION OF PROCEDURE:  The patient was seen in the holding area and she  had no further questions. We confirmed that we are doing bilateral central  ductal excisions.   She was taken to the operating room and after satisfactory general  anesthesia had been obtained, both breasts were prepped and draped as in a  sterile field. The right breast was covered with a towel. The time-out  occurred.   I initially tried to cannulate the duct producing the most fluid which was  on the left side in the inferior position but I was unable to do so. There  was an old curvilinear circumareolar scar in the inferior aspect of the  breast. I went ahead and opened that scar and subareolarly there was  markedly dilated ducts filled with fluid and what at least one point looked  like some papillomatous type material. I did a wide central excision  basically disconnecting the nipple proper from the breast tissue and taking  the central breast tissue which contained these markedly dilated ducts out  trying to get as much of this out as  possible without completing a  mastectomy by just a central excision.   I spent some time making sure everything was dry. We infiltrated some  Marcaine to help with postoperative analgesia. I closed the breast somewhat  was some 3-0 Vicryl's. I put a pursestring around the nipple to try to evert  it and then closed the skin was some 4-0 Monocryl subcuticular plus  Dermabond.   Instruments and gloves were changed and the left side covered and the right  side exposed. Over here again I tried to cannulate a duct but was unable to  but made a superior circumareolar incision since that was where the majority  of the problem appeared. Again, there was markedly dilated ducts, what  appeared to be some papillomas material in one of them and several cysts and  I again did a complete central excision.   Again we put Marcaine for analgesia. I made sure everything was dry and  closed in layers with 3-0 Vicryl, pursestring of 4-0 Monocryl underneath the  nipple and 4-0 Monocryl subcuticular. The patient tolerated the procedure  well. There  are no operative complications. All counts were correct.      Currie Paris, M.D.  Electronically Signed     CJS/MEDQ  D:  01/14/2005  T:  01/14/2005  Job:  [id number]   cc:   Juline Patch, M.D.  [address]  [place], [place] [address]  Fax: [phone]   Jaclyn Prime. Lucas Mallow, M.D.  [address]  [place]  [place] [address]  Fax: [phone]

## 2010-09-12 NOTE — Assessment & Plan Note (Signed)
[facility]                               PULMONARY OFFICE NOTE   Olivia, Gray                          MRN:          [id number]  DATE:02/19/2006                            DOB:          12/08/1932    PULMONARY/ALLERGY FOLLOW-UP:   PROBLEM LIST:  1. Nonallergic rhinitis.  2. Cough.   HISTORY:  She and her husband return again saying there has been no change  in her sensation that she has to constantly blow her nose because of  excessive nasal mucus.  Reglan made no difference.  She says it got worse  after she moved here from [place] and was best when she lived in [place].  They are going up there to stay for awhile and will pay attention to  symptom change.   MEDICATIONS:  1. Lisinopril 20 mg.  2. Cartia XT 180 mg.  3. Simvastatin 40 mg.  4. Ecotrin 325 mg.  5. Metformin.  6. Ipratropium 0.06% nasal spray.  7. Reglan is discontinued.   She failed to respond to doxepin as an antihistamine (or as an  antidepressant), to Reglan, Flonase, or oral antihistamines.  She is not  having headache, purulent or bloody discharge, itching or sneezing, just a  sensation that she has to blow her nose.  I was going to refer her for an  ENT opinion and we got set to send her to Dr. Annalee Genta.  She and her  husband recognized that they had been at that office before and did not want  to go back there.  Dr. Thurmon Fair office sent her notes from two visits in  2006 when she was referred to them by Kerry Kass, MD, for the same  complaints of chronic rhinitis and nasal discharge.  A CT scan from June  2006 showed some septal deviation and turbinate hypertrophy but no evidence  of obstruction or infection, nothing to suggest a leak of spinal fluid  apparently.  Exam was unremarkable, and his impression was:  Vasomotor  rhinitis, chronic rhinorrhea, and deviated nasal septum with no surgical  problem.   OBJECTIVE:  VITAL SIGNS:  Weight  162 pounds, BP 110/68, pulse regular at 60,  room air saturation 99%.  GENERAL:  Constantly sniffing and wiping at her nose during our  conversation.  The nasal mucosa actually looks normal, not edematous or  inflamed.  I would not be able to appreciate that there was anything wrong.  Her pharynx looks normal.  There is no adenopathy.  LUNGS:  Clear.  CARDIAC:  Heart sounds regular.   IMPRESSION:  Chronic complaints of nasal congestion, nonallergic rhinitis.  I question of this could be a tic or an obsessive-compulsive situation.  We  had previously discussed whether any of her eye drops could be getting into  her nose and causing some irritation.  That could only be assessed by her  ophthalmologist being willing to take her off or switch around medications  for trial, but again I do not see any obvious irritation.   PLAN:  After extended  discussion, I emphasized:   1. Nasal saline lavage.  2. Try Sinofresh as a nasal lavage fluid if they can find it to use b.i.d.      each nostril.  3. I offered [facility] referral if they wished.  4. Environmental information emphasizing a reduction of dust, pollen and      indoor allergy triggers even though skin testing has not shown her to      be allergic according to testing at Dr. Kerry Kass.  5. Blood is sent for a RAST allergy profile screen.  6. Return p.r.n.     Clinton D. Maple Hudson, MD, [facility], FACP    CDY/MedQ  DD: 02/20/2006  DT: 02/22/2006  Job #: [id number]   cc:   Juline Patch, M.D.

## 2011-01-05 ENCOUNTER — Other Ambulatory Visit (INDEPENDENT_AMBULATORY_CARE_PROVIDER_SITE_OTHER): Payer: Self-pay | Admitting: Otolaryngology

## 2011-01-05 DIAGNOSIS — J329 Chronic sinusitis, unspecified: Secondary | ICD-10-CM

## 2011-01-06 ENCOUNTER — Ambulatory Visit
Admission: RE | Admit: 2011-01-06 | Discharge: 2011-01-06 | Disposition: A | Discharge status: Home / Self Care | Payer: Medicare Other | Source: Ambulatory Visit | Attending: Otolaryngology | Admitting: Otolaryngology

## 2011-01-06 DIAGNOSIS — J329 Chronic sinusitis, unspecified: Secondary | ICD-10-CM

## 2011-02-26 ENCOUNTER — Other Ambulatory Visit: Payer: Self-pay | Admitting: Internal Medicine

## 2011-02-26 DIAGNOSIS — Z1231 Encounter for screening mammogram for malignant neoplasm of breast: Secondary | ICD-10-CM

## 2011-03-27 ENCOUNTER — Ambulatory Visit
Admission: RE | Admit: 2011-03-27 | Discharge: 2011-03-27 | Disposition: A | Discharge status: Home / Self Care | Payer: Medicare Other | Source: Ambulatory Visit | Attending: Internal Medicine | Admitting: Internal Medicine

## 2011-03-27 DIAGNOSIS — Z1231 Encounter for screening mammogram for malignant neoplasm of breast: Secondary | ICD-10-CM

## 2011-07-31 ENCOUNTER — Emergency Department (HOSPITAL_COMMUNITY)
Admission: EM | Admit: 2011-07-31 | Discharge: 2011-07-31 | Disposition: A | Discharge status: Home / Self Care | Payer: Medicare Other | Source: Home / Self Care | Attending: Family Medicine | Admitting: Family Medicine

## 2011-07-31 ENCOUNTER — Emergency Department (HOSPITAL_COMMUNITY)
Admission: EM | Admit: 2011-07-31 | Discharge: 2011-07-31 | Disposition: A | Discharge status: Home / Self Care | Payer: Medicare Other | Attending: Emergency Medicine | Admitting: Emergency Medicine

## 2011-07-31 ENCOUNTER — Encounter (HOSPITAL_COMMUNITY): Payer: Self-pay | Admitting: *Deleted

## 2011-07-31 ENCOUNTER — Other Ambulatory Visit: Payer: Self-pay

## 2011-07-31 ENCOUNTER — Encounter (HOSPITAL_COMMUNITY): Payer: Self-pay

## 2011-07-31 ENCOUNTER — Emergency Department (HOSPITAL_COMMUNITY): Payer: Medicare Other

## 2011-07-31 DIAGNOSIS — R079 Chest pain, unspecified: Secondary | ICD-10-CM | POA: Insufficient documentation

## 2011-07-31 DIAGNOSIS — I2510 Atherosclerotic heart disease of native coronary artery without angina pectoris: Secondary | ICD-10-CM | POA: Insufficient documentation

## 2011-07-31 DIAGNOSIS — E119 Type 2 diabetes mellitus without complications: Secondary | ICD-10-CM | POA: Insufficient documentation

## 2011-07-31 DIAGNOSIS — I10 Essential (primary) hypertension: Secondary | ICD-10-CM | POA: Insufficient documentation

## 2011-07-31 DIAGNOSIS — I209 Angina pectoris, unspecified: Secondary | ICD-10-CM

## 2011-07-31 LAB — POCT I-STAT, CHEM 8
Calcium, Ion: 1.21 mmol/L (ref 1.12–1.32)
Chloride: 110 mEq/L (ref 96–112)
Glucose, Bld: 89 mg/dL (ref 70–99)
Hemoglobin: 13.9 g/dL (ref 12.0–15.0)
Sodium: 143 mEq/L (ref 135–145)

## 2011-07-31 LAB — POCT I-STAT TROPONIN I: Troponin i, poc: 0.01 ng/mL (ref 0.00–0.08)

## 2011-07-31 MED ORDER — ASPIRIN 81 MG PO CHEW
324.0000 mg | CHEWABLE_TABLET | Freq: Once | ORAL | Status: AC
  Administered 2011-07-31: 243 mg via ORAL
  Filled 2011-07-31: qty 4

## 2011-07-31 NOTE — ED Provider Notes (Signed)
History     CSN: [id number]  Arrival date & time 07/31/11  1816   First MD Initiated Contact with Patient 07/31/11 1912      Chief Complaint  Patient presents with  . Chest Pain     HPI Pt is here with complaints of fleeting right and left sided chest pain.  Patient states that the pain lasted seconds.  Not associated with other symptoms.  Patient asymptomatic currently. Pt describes pain as sharp, but lasting only seconds in duration. Pt denies SOB/N/V/D. Pt has history of CAD with CABG.  Past Medical History  Diagnosis Date  . Coronary artery disease   . Diabetes mellitus   . Hypertension     Past Surgical History  Procedure Date  . Coronary artery bypass graft     No family history on file.  History  Substance Use Topics  . Smoking status: Never Smoker   . Smokeless tobacco: Not on file  . Alcohol Use: No    OB History    Grav Para Term Preterm Abortions TAB SAB Ect Mult Living                  Review of Systems  All other systems reviewed and are negative.    Allergies  Review of patient's allergies indicates no known allergies.  Home Medications   Current Outpatient Rx  Name Route Sig Dispense Refill  . ASPIRIN 81 MG PO TABS Oral Take 81 mg by mouth daily.    Marland Kitchen CARVEDILOL 25 MG PO TABS Oral Take 25 mg by mouth 2 (two) times daily with a meal.    . LOSARTAN POTASSIUM 100 MG PO TABS Oral Take 100 mg by mouth daily.    Marland Kitchen METFORMIN HCL ER (MOD) 500 MG PO TB24 Oral Take 500 mg by mouth daily with breakfast.    . SIMVASTATIN 40 MG PO TABS Oral Take 40 mg by mouth every evening.      BP 180/84  Pulse 60  Temp(Src) 98 F (36.7 C) (Oral)  Resp 18  Ht 5\' 4"  (1.626 m)  Wt 152 lb (68.947 kg)  BMI 26.09 kg/m2  SpO2 96%  Physical Exam  Nursing note and vitals reviewed. Constitutional: She is oriented to person, place, and time. She appears well-developed and well-nourished. No distress.  HENT:  Head: Normocephalic and atraumatic.  Eyes: Pupils are  equal, round, and reactive to light.  Neck: Normal range of motion.  Cardiovascular: Normal rate and intact distal pulses.         Date: 07/31/2011  Rate: 63  Rhythm: normal sinus rhythm  QRS Axis: normal  Intervals: normal  ST/T Wave abnormalities: normal  Conduction Disutrbances: none  Narrative Interpretation: unremarkable      Pulmonary/Chest: No respiratory distress.  Abdominal: Normal appearance. She exhibits no distension.  Musculoskeletal: Normal range of motion.  Neurological: She is alert and oriented to person, place, and time. No cranial nerve deficit.  Skin: Skin is warm and dry. No rash noted.  Psychiatric: She has a normal mood and affect. Her behavior is normal.    ED Course  Procedures (including critical care time)   Labs Reviewed  POCT I-STAT, CHEM 8  POCT I-STAT TROPONIN I   Dg Chest 2 View  07/31/2011  *RADIOLOGY REPORT*  Clinical Data: Chest pain  CHEST - 2 VIEW  Comparison: 05/08/2009  Findings:  Borderline cardiomegaly.  The patient is status post median sternotomy.  No acute infiltrate or pleural effusion.  No pulmonary  edema.  Bony thorax is stable.  IMPRESSION: No active disease.  No significant change .  Original Report Authenticated By: Natasha Mead, M.D.     1. Chest pain       MDM          Nelia Shi, MD 07/31/11 2250

## 2011-07-31 NOTE — ED Notes (Signed)
Pt. Sent to [place] from urgent care for spec. Care of chest pain,  Pt. Is in NAD and denies any chest pain at present time. Denies any sob or n/v

## 2011-07-31 NOTE — ED Notes (Signed)
Pt states she had 2 episodes of left sided CP that lasted for "a couple seconds".  One episode was last night and one was this morning.  Denied any n/v, diaphoresis, SOB with the episodes.  States she is not currently having any pain.

## 2011-07-31 NOTE — Discharge Instructions (Signed)
Chest Pain (Nonspecific) It is often hard to give a specific diagnosis for the cause of chest pain. There is always a chance that your pain could be related to something serious, such as a heart attack or a blood clot in the lungs. You need to follow up with your caregiver for further evaluation. CAUSES   Heartburn.   Pneumonia or bronchitis.   Anxiety or stress.   Inflammation around your heart (pericarditis) or lung (pleuritis or pleurisy).   A blood clot in the lung.   A collapsed lung (pneumothorax). It can develop suddenly on its own (spontaneous pneumothorax) or from injury (trauma) to the chest.   Shingles infection (herpes zoster virus).  The chest wall is composed of bones, muscles, and cartilage. Any of these can be the source of the pain.  The bones can be bruised by injury.   The muscles or cartilage can be strained by coughing or overwork.   The cartilage can be affected by inflammation and become sore (costochondritis).  DIAGNOSIS  Lab tests or other studies, such as X-rays, electrocardiography, stress testing, or cardiac imaging, may be needed to find the cause of your pain.  TREATMENT   Treatment depends on what may be causing your chest pain. Treatment may include:   Acid blockers for heartburn.   Anti-inflammatory medicine.   Pain medicine for inflammatory conditions.   Antibiotics if an infection is present.   You may be advised to change lifestyle habits. This includes stopping smoking and avoiding alcohol, caffeine, and chocolate.   You may be advised to keep your head raised (elevated) when sleeping. This reduces the chance of acid going backward from your stomach into your esophagus.   Most of the time, nonspecific chest pain will improve within 2 to 3 days with rest and mild pain medicine.  HOME CARE INSTRUCTIONS   If antibiotics were prescribed, take your antibiotics as directed. Finish them even if you start to feel better.   For the next few  days, avoid physical activities that bring on chest pain. Continue physical activities as directed.   Do not smoke.   Avoid drinking alcohol.   Only take over-the-counter or prescription medicine for pain, discomfort, or fever as directed by your caregiver.   Follow your caregiver's suggestions for further testing if your chest pain does not go away.   Keep any follow-up appointments you made. If you do not go to an appointment, you could develop lasting (chronic) problems with pain. If there is any problem keeping an appointment, you must call to reschedule.  SEEK MEDICAL CARE IF:   You think you are having problems from the medicine you are taking. Read your medicine instructions carefully.   Your chest pain does not go away, even after treatment.   You develop a rash with blisters on your chest.  SEEK IMMEDIATE MEDICAL CARE IF:   You have increased chest pain or pain that spreads to your arm, neck, jaw, back, or abdomen.   You develop shortness of breath, an increasing cough, or you are coughing up blood.   You have severe back or abdominal pain, feel nauseous, or vomit.   You develop severe weakness, fainting, or chills.   You have a fever.  THIS IS AN EMERGENCY. Do not wait to see if the pain will go away. Get medical help at once. Call your local emergency services (911 in [place] Do not drive yourself to the hospital. MAKE SURE YOU:   Understand these instructions.     Will watch your condition.   Will get help right away if you are not doing well or get worse.  Document Released: 01/21/2005 Document Revised: 04/02/2011 Document Reviewed: 11/17/2007 [employer] Patient Information 2012 [employer]. 

## 2011-07-31 NOTE — ED Notes (Signed)
Pt is here with complaints of fleeting right and left sided chest pain.  Pt describes pain as sharp, but lasting only seconds in duration.  Pt denies SOB/N/V/D.  Pt has history of CAD with CABG.

## 2011-07-31 NOTE — ED Provider Notes (Signed)
History     CSN: [id number]  Arrival date & time 07/31/11  1626   First MD Initiated Contact with Patient 07/31/11 1629      Chief Complaint  Patient presents with  . Chest Pain    (Consider location/radiation/quality/duration/timing/severity/associated sxs/prior treatment) HPI Comments: 78 year old female with history of diabetes, hypertension and coronary artery disease status post cardiac bypass surgery. Here complaining of sharp intermittent left side chest pain that radiates to the right side lasting few seconds. Had an episode at 11 pm while in bed last night, repeated this morning without diaphoresis or dizziness. Currently asymptomatic. Denies current chest pain, shortness of breath or nausea. No palpitations, leg swelling or PND.    Past Medical History  Diagnosis Date  . Coronary artery disease   . Diabetes mellitus   . Hypertension     Past Surgical History  Procedure Date  . Coronary artery bypass graft     History reviewed. No pertinent family history.  History  Substance Use Topics  . Smoking status: Never Smoker   . Smokeless tobacco: Not on file  . Alcohol Use: No    OB History    Grav Para Term Preterm Abortions TAB SAB Ect Mult Living                  Review of Systems  Constitutional: Negative for fever, chills, diaphoresis and appetite change.  Respiratory: Negative for cough, shortness of breath and wheezing.   Cardiovascular: Positive for chest pain. Negative for palpitations and leg swelling.  Gastrointestinal: Negative for nausea.  Skin: Negative for rash.  Neurological: Negative for dizziness and syncope.    Allergies  Review of patient's allergies indicates no known allergies.  Home Medications   Current Outpatient Rx  Name Route Sig Dispense Refill  . ASPIRIN 81 MG PO TABS Oral Take 81 mg by mouth daily.    Marland Kitchen CARVEDILOL 25 MG PO TABS Oral Take 25 mg by mouth 2 (two) times daily with a meal.    . LOSARTAN POTASSIUM 100 MG PO TABS  Oral Take 100 mg by mouth daily.    Marland Kitchen METFORMIN HCL 500 MG PO TABS Oral Take 500 mg by mouth 2 (two) times daily with a meal.    . SIMVASTATIN 40 MG PO TABS Oral Take 40 mg by mouth every evening.      BP 179/77  Pulse 70  Temp(Src) 98.2 F (36.8 C) (Oral)  Resp 14  SpO2 99%  Physical Exam  Nursing note and vitals reviewed. Constitutional: She is oriented to person, place, and time. She appears well-developed and well-nourished. No distress.  HENT:  Head: Normocephalic and atraumatic.  Eyes: Conjunctivae are normal. Pupils are equal, round, and reactive to light.  Neck: Neck supple. No JVD present.  Cardiovascular: Normal rate and regular rhythm.  Exam reveals no gallop and no friction rub.   Murmur heard.      3-4/6 SEM. No LEE  Pulmonary/Chest: Breath sounds normal. No respiratory distress. She has no wheezes. She has no rales.  Abdominal: Soft. Bowel sounds are normal. She exhibits no distension. There is no tenderness.       No ascites. No Hepato jugular relfex.  Neurological: She is alert and oriented to person, place, and time.  Skin: Skin is warm. No rash noted. She is not diaphoretic.    ED Course  Procedures (including critical care time)  Labs Reviewed - No data to display No results found.   1. Angina pectoris  MDM  78 year old female with history of diabetes, hypertension and coronary artery disease status post cardiac bypass surgery. Here complaining of intermittent left side chest pain that radiates to the right side lasting few seconds. Had an episode at 11 pm while in bed last night, repeated this morning without diaphoresis or dizziness. Currently asymptomatic. EKG with no acute ischemic changes. Vital signs are stable. Decided to transfer to the emergency department via shuttle for further evaluation and management. Patient cardiology group is [facility] cardiology Dr. Clarene Duke. EKG: Rate 62. NSR. No ST changes. No Q waves.          Sharin Grave, MD 08/02/11 1400

## 2011-07-31 NOTE — ED Notes (Signed)
Pt requesting to speak with physician.

## 2011-07-31 NOTE — ED Notes (Signed)
No new rx, pt voiced understanding to f/u with Dr. Clarene Duke

## 2011-10-28 ENCOUNTER — Other Ambulatory Visit: Payer: Self-pay | Admitting: Internal Medicine

## 2011-10-28 DIAGNOSIS — R443 Hallucinations, unspecified: Secondary | ICD-10-CM

## 2011-10-30 ENCOUNTER — Ambulatory Visit
Admission: RE | Admit: 2011-10-30 | Discharge: 2011-10-30 | Disposition: A | Discharge status: Home / Self Care | Payer: Medicare Other | Source: Ambulatory Visit | Attending: Internal Medicine | Admitting: Internal Medicine

## 2011-10-30 DIAGNOSIS — R443 Hallucinations, unspecified: Secondary | ICD-10-CM

## 2012-02-22 ENCOUNTER — Emergency Department (INDEPENDENT_AMBULATORY_CARE_PROVIDER_SITE_OTHER): Payer: Medicare Other

## 2012-02-22 ENCOUNTER — Encounter (HOSPITAL_COMMUNITY): Payer: Self-pay | Admitting: Emergency Medicine

## 2012-02-22 ENCOUNTER — Emergency Department (INDEPENDENT_AMBULATORY_CARE_PROVIDER_SITE_OTHER)
Admission: EM | Admit: 2012-02-22 | Discharge: 2012-02-22 | Disposition: A | Discharge status: Home / Self Care | Payer: Medicare Other | Source: Home / Self Care | Attending: Emergency Medicine | Admitting: Emergency Medicine

## 2012-02-22 DIAGNOSIS — S43109A Unspecified dislocation of unspecified acromioclavicular joint, initial encounter: Secondary | ICD-10-CM

## 2012-02-22 MED ORDER — ACETAMINOPHEN-CODEINE #3 300-30 MG PO TABS: 1.0000 | ORAL_TABLET | ORAL | Status: DC | PRN

## 2012-02-22 NOTE — ED Notes (Signed)
Thumb spica placed by kristin, emt

## 2012-02-22 NOTE — ED Provider Notes (Signed)
Chief Complaint  Patient presents with  . Shoulder Pain    History of Present Illness:   The patient is a 78 year old female who is visually impaired and she misstepped this morning getting out of bed, losing her balance and falling against a bedside table. She hit her head and her left shoulder. She denies any serious head injury. There was no loss of consciousness and she doesn't have a headache, a bump on her head or a bruise. She does have pain in the shoulder however and a bump on the superior shoulder with pain on any movement of the shoulder. She denies any pain radiating down the arm, numbness, tingling, weakness in the arm. She denies any injury to the neck, facial bones, chest, upper lower back, abdomen, extremities, and she denies any numbness or, tingling, or weakness.  Review of Systems:  Other than noted above, the patient denies any of the following symptoms: Systemic:  No fevers or chills. Eye:  No diplopia or blurred vision. ENT:  No headache, facial pain, or bleeding from the nose or ears.  No loose or broken teeth. Neck:  No neck pain or stiffnes. Resp:  No shortness of breath. Cardiac:  No chest pain. No palpitations, dizziness, syncope or fainting. GI:  No abdominal pain. No nausea, vomiting, or diarrhea. GU:  No blood in urine. M-S:  No extremity pain, swelling, bruising, limited ROM, neck or back pain. Neuro:  No headache, loss of consciousness, seizure activity, dizziness, vertigo, paresthesias, numbness, or weakness.  No difficulty with speech or ambulation.   PMFSH:  Past medical history, family history, social history, meds, and allergies were reviewed.  Physical Exam:   Vital signs:  BP 164/89  Pulse 81  Temp 97.7 F (36.5 C) (Oral)  Resp 16  SpO2 100% General:  Alert, oriented and in no distress. Eye:  PERRL, full EOMs. ENT:  No cranial or facial tenderness to palpation. Neck:  No tenderness to palpation.  Full ROM without pain. Heart:  Regular rhythm.   No extrasystoles, gallops, or murmers. Lungs:  No chest wall tenderness to palpation. Breath sounds clear and equal bilaterally.  No wheezes, rales or rhonchi. Abdomen:  Non tender. Back:  Non tender to palpation.  Full ROM without pain. Extremities:  There is obvious deformity over the a.c. joint with tenderness to palpation. The shoulder had a limited range of motion with pain.  Full ROM of all other joints without pain.  Pulses full.  Brisk capillary refill. Neuro:  Alert and oriented times 3.  Cranial nerves intact.  No muscle weakness.  Sensation intact to light touch.  Gait normal. Skin:  No bruising, abrasions, or lacerations.  Radiology:  Dg Shoulder Left  02/22/2012  *RADIOLOGY REPORT*  Clinical Data: Fall.  Pain.  LEFT SHOULDER - 2+ VIEW  Comparison: None.  Findings: There is a left AC joint separation, likely grade 3.  No fracture visualized.  Degenerative changes in the [facility] and glenohumeral joints.  IMPRESSION: Left AC joint separation.   Original Report Authenticated By: Cyndie Chime, M.D.      The images were independently reviewed by myself I concur with the radiologist's findings.  Course in Urgent Care Center:   The patient was placed in a shoulder immobilizer. She was also given a thumb spica splint for the right hand. She had a long-standing history of right thumb pain and her primary care physician had prescribed a thumb spica, however her husband has not been able to locate one  of these in town and requested one today here.  Assessment:  The encounter diagnosis was AC separation.  Plan:   1.  The following meds were prescribed:   New Prescriptions   ACETAMINOPHEN-CODEINE (TYLENOL #3) 300-30 MG PER TABLET    Take 1-2 tablets by mouth every 4 (four) hours as needed for pain.   2.  The patient was instructed in symptomatic care and handouts were given. 3.  The patient was told to return if becoming worse in any way, if no better in 3 or 4 days, and given some red flag  symptoms that would indicate earlier return.  Follow up:  The patient was told to follow up with Dr. Aldean Baker within the next one to 2 days.    Reuben Likes, MD 02/22/12 1414

## 2012-02-22 NOTE — ED Notes (Signed)
Patient reports she was trying to get out of bed this am, miss judged herself ( and has poor eye site).  Patient fell hitting night stand with left side of head and left shoulder.  Left shoulder has been the primary pain site.  No loc.  Unable to raise left arm above head.  Visibly different appearance to the top of left shoulder versus right shoulder.  Radial pulses 2 plus.

## 2012-04-02 ENCOUNTER — Emergency Department (HOSPITAL_COMMUNITY)
Admission: EM | Admit: 2012-04-02 | Discharge: 2012-04-02 | Disposition: A | Discharge status: Home / Self Care | Payer: Medicare Other | Source: Home / Self Care | Attending: Family Medicine | Admitting: Family Medicine

## 2012-04-02 ENCOUNTER — Encounter (HOSPITAL_COMMUNITY): Payer: Self-pay | Admitting: *Deleted

## 2012-04-02 DIAGNOSIS — W19XXXA Unspecified fall, initial encounter: Secondary | ICD-10-CM

## 2012-04-02 DIAGNOSIS — Y92009 Unspecified place in unspecified non-institutional (private) residence as the place of occurrence of the external cause: Secondary | ICD-10-CM

## 2012-04-02 DIAGNOSIS — S40029A Contusion of unspecified upper arm, initial encounter: Secondary | ICD-10-CM

## 2012-04-02 NOTE — ED Provider Notes (Signed)
History     CSN: [id number]  Arrival date & time 04/02/12  1738   First MD Initiated Contact with Patient 04/02/12 1823      Chief Complaint  Patient presents with  . Fall    (Consider location/radiation/quality/duration/timing/severity/associated sxs/prior treatment) Patient is a 78 y.o. female presenting with fall. The history is provided by the patient and the spouse.  Fall The accident occurred 6 to 12 hours ago. The fall occurred while walking. She landed on carpet. There was no blood loss. The point of impact was the head, right shoulder and right hip. The pain is present in the head and right shoulder. The pain is mild. She was ambulatory at the scene. There was no entrapment after the fall. There was no alcohol use involved in the accident. Associated symptoms include visual change. Pertinent negatives include no headaches and no loss of consciousness. Associated symptoms comments: Severe visual impairment leading to repeated falls..    Past Medical History  Diagnosis Date  . Coronary artery disease   . Diabetes mellitus   . Hypertension     Past Surgical History  Procedure Date  . Coronary artery bypass graft     Family History  Problem Relation Age of Onset  . Family history unknown: Yes    History  Substance Use Topics  . Smoking status: Never Smoker   . Smokeless tobacco: Not on file  . Alcohol Use: No    OB History    Grav Para Term Preterm Abortions TAB SAB Ect Mult Living                  Review of Systems  Constitutional: Negative.   HENT: Negative for neck pain.   Cardiovascular: Negative for chest pain.  Gastrointestinal: Negative.   Neurological: Negative for dizziness, loss of consciousness, syncope, speech difficulty, weakness, light-headedness and headaches.    Allergies  Review of patient's allergies indicates no known allergies.  Home Medications   Current Outpatient Rx  Name  Route  Sig  Dispense  Refill  .  ACETAMINOPHEN-CODEINE #3 300-30 MG PO TABS   Oral   Take 1-2 tablets by mouth every 4 (four) hours as needed for pain.   30 tablet   0   . ASPIRIN 81 MG PO TABS   Oral   Take 81 mg by mouth daily.         Marland Kitchen CARVEDILOL 25 MG PO TABS   Oral   Take 25 mg by mouth 2 (two) times daily with a meal.         . ESCITALOPRAM OXALATE 10 MG PO TABS   Oral   Take 10 mg by mouth daily.         Marland Kitchen GABAPENTIN 300 MG PO CAPS   Oral   Take 300 mg by mouth 3 (three) times daily.         Marland Kitchen LOSARTAN POTASSIUM 100 MG PO TABS   Oral   Take 100 mg by mouth daily.         Marland Kitchen METFORMIN HCL ER (MOD) 500 MG PO TB24   Oral   Take 500 mg by mouth daily with breakfast.         . NORTRIPTYLINE HCL 50 MG PO CAPS   Oral   Take 50 mg by mouth at bedtime.         Marland Kitchen SIMVASTATIN 40 MG PO TABS   Oral   Take 40 mg by mouth every evening.  BP 175/71  Pulse 83  Temp 98 F (36.7 C) (Oral)  Resp 18  SpO2 99%  Physical Exam  Nursing note and vitals reviewed. Constitutional: She is oriented to person, place, and time. She appears well-developed and well-nourished.  HENT:  Head: Normocephalic.  Right Ear: External ear normal.  Left Ear: External ear normal.  Mouth/Throat: Oropharynx is clear and moist.       Right parietal soreness, no visible trauma.  Neck: Normal range of motion. Neck supple.  Pulmonary/Chest: She exhibits no tenderness.  Musculoskeletal: Normal range of motion.       Ambulatory, moving all ext .  Neurological: She is alert and oriented to person, place, and time.  Skin:       Contusion to right upper arm.    ED Course  Procedures (including critical care time)  Labs Reviewed - No data to display No results found.   1. Fall at home       MDM          Linna Hoff, MD 04/02/12 [phone]

## 2012-04-02 NOTE — ED Notes (Signed)
Pt reports falling today and landing on right right side hitting shoulder, knee and head around 130 this afternoon

## 2012-11-20 ENCOUNTER — Encounter: Payer: Self-pay | Admitting: *Deleted

## 2012-11-23 ENCOUNTER — Ambulatory Visit: Payer: Medicare Other | Admitting: Cardiology

## 2012-11-23 ENCOUNTER — Encounter: Payer: Self-pay | Admitting: Cardiology

## 2013-04-27 DIAGNOSIS — I341 Nonrheumatic mitral (valve) prolapse: Secondary | ICD-10-CM | POA: Insufficient documentation

## 2013-04-27 HISTORY — DX: Nonrheumatic mitral (valve) prolapse: I34.1

## 2013-05-22 ENCOUNTER — Other Ambulatory Visit: Payer: Self-pay

## 2013-05-22 MED ORDER — CARVEDILOL 3.125 MG PO TABS: 3.1250 mg | ORAL_TABLET | Freq: Two times a day (BID) | ORAL | Status: DC

## 2013-05-22 NOTE — Telephone Encounter (Signed)
Rx was sent to pharmacy electronically. 

## 2013-06-14 ENCOUNTER — Ambulatory Visit: Payer: Medicare Other | Admitting: Internal Medicine

## 2013-06-29 ENCOUNTER — Other Ambulatory Visit: Payer: Self-pay | Admitting: *Deleted

## 2013-06-29 MED ORDER — CARVEDILOL 3.125 MG PO TABS: 3.1250 mg | ORAL_TABLET | Freq: Two times a day (BID) | ORAL | Status: DC

## 2013-06-29 NOTE — Telephone Encounter (Signed)
Refilled  Patient's carvedilol via e-scribe to [facility]. Only enough prescribed until her scheduled  appointment.

## 2013-06-30 ENCOUNTER — Other Ambulatory Visit: Payer: Self-pay | Admitting: *Deleted

## 2013-06-30 MED ORDER — CARVEDILOL 3.125 MG PO TABS: 3.1250 mg | ORAL_TABLET | Freq: Two times a day (BID) | ORAL | Status: DC

## 2013-06-30 NOTE — Telephone Encounter (Signed)
Rx was sent to pharmacy electronically. 

## 2013-07-10 ENCOUNTER — Encounter: Payer: Self-pay | Admitting: Cardiology

## 2013-07-10 ENCOUNTER — Ambulatory Visit (INDEPENDENT_AMBULATORY_CARE_PROVIDER_SITE_OTHER): Payer: Medicare Other | Admitting: Cardiology

## 2013-07-10 VITALS — BP 140/78 | HR 58 | Ht 64.0 in | Wt 159.0 lb

## 2013-07-10 DIAGNOSIS — I10 Essential (primary) hypertension: Secondary | ICD-10-CM

## 2013-07-10 DIAGNOSIS — Z951 Presence of aortocoronary bypass graft: Secondary | ICD-10-CM | POA: Insufficient documentation

## 2013-07-10 DIAGNOSIS — E785 Hyperlipidemia, unspecified: Secondary | ICD-10-CM

## 2013-07-10 DIAGNOSIS — I059 Rheumatic mitral valve disease, unspecified: Secondary | ICD-10-CM

## 2013-07-10 DIAGNOSIS — I341 Nonrheumatic mitral (valve) prolapse: Secondary | ICD-10-CM | POA: Insufficient documentation

## 2013-07-10 DIAGNOSIS — I2510 Atherosclerotic heart disease of native coronary artery without angina pectoris: Secondary | ICD-10-CM

## 2013-07-10 NOTE — Patient Instructions (Signed)
Your physician has requested that you have an echocardiogram. Echocardiography is a painless test that uses sound waves to create images of your heart. It provides your doctor with information about the size and shape of your heart and how well your heart's chambers and valves are working. This procedure takes approximately one hour. There are no restrictions for this procedure. Will call with results  The current medical regimen is effective;  continue present plan and medications..  Your physician wants you to follow-up in 6 month. You will receive a reminder letter in the mail two months in advance. If you don't receive a letter, please call our office to schedule the follow-up appointment.

## 2013-07-10 NOTE — Progress Notes (Signed)
PATIENTAbbigal Gray MRN: [id number]  DOB: 10/01/1932   DOV:07/12/2013 PCP: Tommy Medal, MD  Clinic Note: Chief Complaint  Patient presents with  . Follow-up    Past Due Follow-up From [facility]   HPI: Olivia Gray is a 80 y.o.  female with a PMH below who presents today for what amounts of the 18 month followup for CAD. She last saw Dr. Chase Picket on the 16th of July 2013. She is quite stable at that time anemia no recommendations for changes. Her most recent evaluation started in January 2011 when she had a Myoview was suggestive of ischemia but not noted to have any fruitful data on cardiac catheterization. She was noted to have mild anterior leaflet prolapse of the mitral valve with no MR. She must have fallen through the cracks for establishing followup after Dr. Rex Kras retired.  Interval History: She presents today, relatively stable from a cardiac standpoint. She is relatively active woman who denies any sensation of chest tightness or pressure with rest or exertion. She has no dyspnea with his activities unless she really pushes it hard. She denies any rapid or irregular heartbeats. No PND, orthopnea or edema. No claudication. No melena, hematochezia which.. Mild indigestion sounding symptoms of belching or burping. No TIA or amaurosis fugax symptoms. No syncope or near syncope.  Past Medical History  Diagnosis Date  . CAD, multiple vessel 2004    CABG (in [place])   . S/P CABG x 4 2004    SVG-LAD, SVG-D1, SVG-OM1, SVG-RPDA. (Native LIMA was 100% occluded)  . Hypertension   . Hyperlipidemia LDL goal <70     On statin.  . Diabetes mellitus type II, controlled     Diet controlled  . Retinitis pigmentosa of both eyes      now essentially legally blind.  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 Mitral valve prolapse 02/07/2008    Mild Anterior leaflet prolapse, with mild MR.      Prior Cardiac Evaluation and Past Surgical History: Past Surgical History  Procedure Laterality Date  . Nm myocar perf wall motion  04/2009    EF 76%; mild ischeia basal inferolateral,mid inferoinlateral regions(s), abn pharmacologic nuc test, deffect indicte new ischemia,LV syst.fx normal --> cath showing no obstructive targets.  . Doppler echocardiography  02/07/2008    EF=>55%; LV normal; mild anterior MDP with mild MR.  . Cardiac catheterization  05/22/2009    all grafts patent ,LV systolic fx normal,small -vessel diabetic disease in native vessels   . Coronary artery bypass graft  07/21/2002    in [place] ; SVG-LAD, SVG-D1, SVG-OM1, SVG RPDA.  Marland Kitchen Cholecystectomy  02/05/2005    No Known Allergies  Current Outpatient Prescriptions  Medication Sig Dispense Refill  . acetaminophen-codeine (TYLENOL #3) 300-30 MG per tablet Take 1-2 tablets by mouth every 4 (four) hours as needed for pain.  30 tablet  0  . aspirin 81 MG tablet Take 81 mg by mouth daily.      Marland Kitchen BIOTIN PO Take 1 tablet by mouth daily.      . carvedilol (COREG) 3.125 MG tablet Take 1 tablet (3.125 mg total) by mouth 2 (two) times daily.  60 tablet  0  . cholecalciferol (VITAMIN D) 1000 UNITS tablet Take 1,000 Units by mouth daily.      Marland Kitchen escitalopram (LEXAPRO) 10 MG tablet Take 10 mg by mouth daily.      Marland Kitchen  ipratropium (ATROVENT) 0.06 % nasal spray Place 1 spray into the nose daily.      Marland Kitchen losartan (COZAAR) 100 MG tablet Take 100 mg by mouth daily.      . simvastatin (ZOCOR) 40 MG tablet Take 20 mg by mouth every evening.       . temazepam (RESTORIL) 15 MG capsule Take 1 capsule by mouth at bedtime as needed.       No current facility-administered medications for this visit.    History   Social History Narrative   She is married with 2 children. Does not smoke or drink alcohol. She does routine exercise roughly 3-4 days a week where she follows a   The video. Weights stomach exercises jumping jacks and other  aerobic type exercises.   ROS: A comprehensive Review of Systems - Negative except Occasional lotion edema there has beenthe most notable symptom is the visual hallucinations that she still sees. However they are notably improved from before.  PHYSICAL EXAM BP 140/78  Pulse 58  Ht 5\' 4"  (1.626 m)  Wt 159 lb (72.122 kg)  BMI 27.28 kg/m2 General appearance: alert, cooperative, appears stated age, no distress and Healthy-appearing. Normal mood and affect. Answers questions appropriately. Neck: no adenopathy, no carotid bruit, no JVD, supple, symmetrical, trachea midline and thyroid not enlarged, symmetric, no tenderness/mass/nodules Lungs: clear to auscultation bilaterally, normal percussion bilaterally and Nonlabored, good air movement. Heart: regular rate and rhythm, S1, S2 normal, no rub or gallop and normal apical impulse; soft systolic click with minimal HSM at apex.  Abdomen: soft, non-tender; bowel sounds normal; no masses,  no organomegaly Extremities: extremities normal, atraumatic, no cyanosis or edema Pulses: 2+ and symmetric Skin: Skin color, texture, turgor normal. No rashes or lesions Neurologic: Alert and oriented X 3, normal strength and tone. Normal symmetric reflexes. Normal coordination and gait   Adult ECG Report  Rate: 58 ;  Rhythm: sinus bradycardia  QRS Axis: 5 ;  PR Interval: 156 ;  QRS Duration: 98 ; QTc: 406 - ms  Voltages: Normal  Conduction Disturbances: none  Other Abnormalities: none   Narrative Interpretation: Essentially normal ECG with no gross abnormalities.  Recent Labs: None available  ASSESSMENT / PLAN: CAD, multiple vessel No real recurrent anginal complaints. She is on beta blocker, ARB as well as aspirin. She is on simvastatin,  At this point in time I am not inclined to order a Myoview followup. She would be due for one prior to a one-year followup for now. In the past did have some mild ischemic changes noted on stress test which was probably  related to microvascular ischemia. Her cardiac catheterization did not show ischemia.  Continue current medications.  S/P CABG x 4 Patent grafts by cath in 2011. This was done the setting of abnormal Myoview. She will be due for routine followup evaluation with Myoview within the year which should be 5 years from her last stress test. Based on her lack of symptoms, I do not see the need for stress test presently.  Mitral valve anterior leaflet prolapse She did have mild prolapse and mild regurgitation on echocardiogram a few years ago. She is not having symptoms, and does not have significant exam findings  To suggest worsening prolapse or regurgitation.  I would like to reassess her EF as well as her mitral valve with an echocardiogram. She has not had one in several years.  Hypertension Borderline control today. With a resting heart rate of 58, and reluctant to increase  her carvedilol dose anymore. She is on the high dose of ARB. This point, I would leave medications alone. I am reluctant to add new medication.  Hyperlipidemia LDL goal <70 On simvastatin. Followed by PCP. Unfortunately, do not have current labs.    Orders Placed This Encounter  Procedures  . EKG 12-Lead  . 2D Echocardiogram without contrast    Standing Status: Future     Number of Occurrences:      Standing Expiration Date: 07/10/2014    Scheduling Instructions:     HX CAD , HX CABG, MV  PROLAPSE    Order Specific Question:  Type of Echo    Answer:  Complete    Order Specific Question:  Where should this test be performed    Answer:  [facility]    Order Specific Question:  Reason for exam-Echo    Answer:  Mitral Valve Disorder  424.0    Order Specific Question:  Reason for exam-Echo    Answer:  CAD Native Vessel  414.01    Order Specific Question:  Reason for exam-Echo    Answer:  Chest Pain  786.50    Followup: 6 months  DAVID W. Ellyn Hack, M.D., M.S. Interventional  Cardiology [facility]

## 2013-07-12 ENCOUNTER — Encounter: Payer: Self-pay | Admitting: Cardiology

## 2013-07-12 NOTE — Assessment & Plan Note (Signed)
Borderline control today. With a resting heart rate of 58, and reluctant to increase her carvedilol dose anymore. She is on the high dose of ARB. This point, I would leave medications alone. I am reluctant to add new medication.

## 2013-07-12 NOTE — Assessment & Plan Note (Addendum)
She did have mild prolapse and mild regurgitation on echocardiogram a few years ago. She is not having symptoms, and does not have significant exam findings  To suggest worsening prolapse or regurgitation.  I would like to reassess her EF as well as her mitral valve with an echocardiogram. She has not had one in several years.

## 2013-07-12 NOTE — Assessment & Plan Note (Signed)
On simvastatin. Followed by PCP. Unfortunately, do not have current labs.

## 2013-07-12 NOTE — Assessment & Plan Note (Signed)
No real recurrent anginal complaints. She is on beta blocker, ARB as well as aspirin. She is on simvastatin,  At this point in time I am not inclined to order a Myoview followup. She would be due for one prior to a one-year followup for now. In the past did have some mild ischemic changes noted on stress test which was probably related to microvascular ischemia. Her cardiac catheterization did not show ischemia.  Continue current medications.

## 2013-07-12 NOTE — Assessment & Plan Note (Signed)
Patent grafts by cath in 2011. This was done the setting of abnormal Myoview. She will be due for routine followup evaluation with Myoview within the year which should be 5 years from her last stress test. Based on her lack of symptoms, I do not see the need for stress test presently.

## 2013-07-28 ENCOUNTER — Other Ambulatory Visit: Payer: Self-pay | Admitting: *Deleted

## 2013-07-28 MED ORDER — CARVEDILOL 3.125 MG PO TABS: 3.1250 mg | ORAL_TABLET | Freq: Two times a day (BID) | ORAL | Status: DC

## 2013-07-28 NOTE — Telephone Encounter (Signed)
Rx refill sent to patients pharmacy  

## 2013-07-31 ENCOUNTER — Ambulatory Visit (HOSPITAL_COMMUNITY)
Admission: RE | Admit: 2013-07-31 | Discharge: 2013-07-31 | Disposition: A | Discharge status: Home / Self Care | Payer: Medicare Other | Source: Ambulatory Visit | Attending: Cardiology | Admitting: Cardiology

## 2013-07-31 DIAGNOSIS — Z951 Presence of aortocoronary bypass graft: Secondary | ICD-10-CM

## 2013-07-31 DIAGNOSIS — I059 Rheumatic mitral valve disease, unspecified: Secondary | ICD-10-CM

## 2013-07-31 DIAGNOSIS — I341 Nonrheumatic mitral (valve) prolapse: Secondary | ICD-10-CM

## 2013-07-31 DIAGNOSIS — I2510 Atherosclerotic heart disease of native coronary artery without angina pectoris: Secondary | ICD-10-CM | POA: Insufficient documentation

## 2013-07-31 NOTE — Progress Notes (Signed)
2D Echo Performed 07/31/2013    Robbie Rideaux, RCS  

## 2013-08-01 NOTE — Progress Notes (Signed)
Quick Note:  Echo results: Good news: Pretty normal echocardiogram and normal pump function with no regional wall motion abnormalities -- signs to suggest heart attack.. EF: 55-60%.  Mild MV Prolapse only with mild MR.   Essentially stable.  Leonie Man, MD     ______

## 2013-08-02 ENCOUNTER — Telehealth: Payer: Self-pay | Admitting: *Deleted

## 2013-08-02 NOTE — Telephone Encounter (Signed)
Spoke to husband. He's taking message for his wife.  Echo Result given . Verbalized understanding Recall appointment for 10 /2015

## 2013-08-02 NOTE — Telephone Encounter (Signed)
Left message to call back .  Concerning echo  result  

## 2013-08-02 NOTE — Telephone Encounter (Signed)
Message copied by Raiford Simmonds on Wed Aug 02, 2013 11:20 AM ------      Message from: Leonie Man      Created: Tue Aug 01, 2013  2:53 PM       Echo results:      Good news: Pretty normal echocardiogram and normal pump function with no  regional wall motion abnormalities -- signs to suggest heart attack..      EF: 55-60%.        Mild MV Prolapse only with mild MR.              Essentially stable.            Leonie Man, MD                         ------

## 2013-08-29 ENCOUNTER — Other Ambulatory Visit: Payer: Self-pay

## 2013-08-29 DIAGNOSIS — Z1231 Encounter for screening mammogram for malignant neoplasm of breast: Secondary | ICD-10-CM

## 2013-09-04 ENCOUNTER — Ambulatory Visit
Admission: RE | Admit: 2013-09-04 | Discharge: 2013-09-04 | Disposition: A | Discharge status: Home / Self Care | Payer: Medicare Other | Source: Ambulatory Visit

## 2013-09-04 ENCOUNTER — Encounter (INDEPENDENT_AMBULATORY_CARE_PROVIDER_SITE_OTHER): Payer: Self-pay

## 2013-09-04 DIAGNOSIS — Z1231 Encounter for screening mammogram for malignant neoplasm of breast: Secondary | ICD-10-CM

## 2013-09-06 ENCOUNTER — Other Ambulatory Visit: Payer: Self-pay | Admitting: Internal Medicine

## 2013-09-06 DIAGNOSIS — R928 Other abnormal and inconclusive findings on diagnostic imaging of breast: Secondary | ICD-10-CM

## 2013-09-14 ENCOUNTER — Ambulatory Visit
Admission: RE | Admit: 2013-09-14 | Discharge: 2013-09-14 | Disposition: A | Discharge status: Home / Self Care | Payer: Medicare Other | Source: Ambulatory Visit | Attending: Internal Medicine | Admitting: Internal Medicine

## 2013-09-14 ENCOUNTER — Encounter (INDEPENDENT_AMBULATORY_CARE_PROVIDER_SITE_OTHER): Payer: Self-pay

## 2013-09-14 DIAGNOSIS — R928 Other abnormal and inconclusive findings on diagnostic imaging of breast: Secondary | ICD-10-CM

## 2013-09-19 ENCOUNTER — Other Ambulatory Visit: Payer: Medicare Other

## 2014-02-06 ENCOUNTER — Ambulatory Visit (INDEPENDENT_AMBULATORY_CARE_PROVIDER_SITE_OTHER): Payer: Medicare Other | Admitting: Cardiology

## 2014-02-06 ENCOUNTER — Encounter: Payer: Self-pay | Admitting: Cardiology

## 2014-02-06 VITALS — BP 138/74 | HR 63 | Ht 64.0 in | Wt 156.0 lb

## 2014-02-06 DIAGNOSIS — I341 Nonrheumatic mitral (valve) prolapse: Secondary | ICD-10-CM

## 2014-02-06 DIAGNOSIS — I10 Essential (primary) hypertension: Secondary | ICD-10-CM

## 2014-02-06 DIAGNOSIS — I2510 Atherosclerotic heart disease of native coronary artery without angina pectoris: Secondary | ICD-10-CM

## 2014-02-06 DIAGNOSIS — E785 Hyperlipidemia, unspecified: Secondary | ICD-10-CM

## 2014-02-06 DIAGNOSIS — Z951 Presence of aortocoronary bypass graft: Secondary | ICD-10-CM

## 2014-02-06 NOTE — Patient Instructions (Addendum)
You are doing GREAT. Keep up the exercises & walking.   Your blood pressure, heart rate & EKG all look good.  I think we can move back to 1 x per year visits.  I will see you back in ~1 year.  Leonie Man, MD   Your physician wants you to follow-up in: 1 year. You will receive a reminder letter in the mail two months in advance. If you don't receive a letter, please call our office to schedule the follow-up appointment.

## 2014-02-06 NOTE — Progress Notes (Signed)
PCP: Tommy Medal, MD  Clinic Note: Chief Complaint  Patient presents with  . 6 MONTH VISIT    NO CHEST PAIN , X1 A FEW WEEKS AGO, NO SOB, NO EDEMA- LEGALLY BLIND    HPI: Olivia Gray is a 80 y.o. female with a PMH below who presents today for six-month followup of her CAD and CABG. I last saw her in March, she was doing quite well from cardiac standpoint. Did not make any changes. She had an echocardiogram done in the interim.  EF 55-60%. Normal wall motion. Gr 1 DD, mild MR with late systolic prolapse. Normal central venous and pulmonary arterial pressures  Past Medical History  Diagnosis Date  . CAD, multiple vessel 2004    Myoview 2011: Mild Basal-mid Inferolateral "ischemia", EF ~76?% -- Anormal, but LOW RISK  . S/P CABG x 4 2004    SVG-LAD, SVG-D1, SVG-OM1, SVG-RPDA. (Native LIMA was 100% occluded)  . Essential hypertension   . Hyperlipidemia LDL goal <70     On statin.  . Diabetes mellitus type II, controlled     Diet controlled  . Retinitis pigmentosa of both eyes      now essentially legally blind.  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 Mitral valve prolapse 02/07/2008; April 2015    a) Mild Anterior MVP, with mild MR.;; b) Echo 07/2013: EF 55-60%, Gr 1 DD, no WMA, Mild central MR with late systolic prolapse     Prior Cardiac Evaluation and Procedure History: Procedure Laterality Date  . Nm myocar perf wall motion  04/2009    EF 76%; mild ischeia basal inferolateral,mid inferoinlateral regions(s), abn pharmacologic nuc test, deffect indicte new ischemia,LV syst.fx normal --> cath showing no obstructive targets.  . Doppler echocardiography  02/07/2008    EF=>55%; LV normal; mild anterior MDP with mild MR.  . Cardiac catheterization  05/22/2009    all grafts patent ,LV systolic fx normal,small -vessel diabetic disease in native vessels   . Coronary  artery bypass graft  07/21/2002    in [place] ; SVG-LAD, SVG-D1, SVG-OM1, SVG RPDA.    Interval History: She presents today doing very well. She had one episode of of fleeting tightness in her chest it lasted less than a minute. This occurred 5 weeks ago. She doesn't recall that it was exacerbated by exertion. She has not had any episodes since or prior to this spell. Otherwise no resting or exertional dyspnea or angina. No PND, orthopnea or significant edema. She does have mild lower extremity edema. No report of any rapid heartbeats, or arrhythmias. No syncope/near syncope, or TIA/amaurosis fugax. No melena, hematochezia, hematuria, or epstaxis. No claudication.  ROS: A comprehensive was performed. Review of Systems  Constitutional: Negative for fever.  HENT: Positive for congestion and sore throat. Negative for nosebleeds.   Respiratory: Negative for cough, hemoptysis, shortness of breath and wheezing.   Cardiovascular: Positive for chest pain. Negative for claudication, leg swelling and PND.       Per history of present illness  Gastrointestinal: Negative for constipation, blood in stool and melena.  Genitourinary: Negative for hematuria.  All other systems reviewed and are negative.   Current Outpatient Prescriptions on File Prior to Visit  Medication Sig Dispense Refill  . aspirin 81 MG tablet Take 81 mg by mouth daily.      Marland Kitchen BIOTIN PO Take 1 tablet by mouth daily.      Marland Kitchen  carvedilol (COREG) 3.125 MG tablet Take 1 tablet (3.125 mg total) by mouth 2 (two) times daily.  60 tablet  6  . cholecalciferol (VITAMIN D) 1000 UNITS tablet Take 1,000 Units by mouth daily.      Marland Kitchen escitalopram (LEXAPRO) 10 MG tablet Take 10 mg by mouth daily.      Marland Kitchen ipratropium (ATROVENT) 0.06 % nasal spray Place 1 spray into the nose daily.      Marland Kitchen losartan (COZAAR) 100 MG tablet Take 100 mg by mouth daily.      . simvastatin (ZOCOR) 40 MG tablet Take 20 mg by mouth every evening.        No current  facility-administered medications on file prior to visit.   ALLERGIES REVIEWED IN EPIC -- no change SOCIAL AND FAMILY HISTORY REVIEWED IN EPIC -- no change; she does a lot of walking but nothing organized.  Wt Readings from Last 3 Encounters:  02/06/14 156 lb (70.761 kg)  07/10/13 159 lb (72.122 kg)  07/31/11 152 lb (68.947 kg)   PHYSICAL EXAM BP 138/74  Pulse 63  Ht 5\' 4"  (1.626 m)  Wt 156 lb (70.761 kg)  BMI 26.76 kg/m2 General appearance: alert, cooperative, appears stated age, no distress and Healthy-appearing. Normal mood and affect. Answers questions appropriately.  Neck: no adenopathy, no carotid bruit, no JVD, supple, symmetrical, trachea midline and thyroid not enlarged, symmetric, no tenderness/mass/nodules  Lungs: clear to auscultation bilaterally, normal percussion bilaterally and Nonlabored, good air movement.  Heart: regular rate and rhythm, S1, S2 normal, no rub or gallop and normal apical impulse; soft systolic click with minimal HSM at apex.  Abdomen: soft, non-tender; bowel sounds normal; no masses, no organomegaly  Extremities: extremities normal, atraumatic, no cyanosis or edema  Pulses: 2+ and symmetric  Skin: Skin color, texture, turgor normal. No rashes or lesions  Neurologic: Alert and oriented X 3, normal strength and tone. Normal symmetric reflexes. Normal coordination and gait    Adult ECG Report  Rate: 63 ;  Rhythm: normal sinus rhythm and premature atrial contractions (PAC)  Narrative Interpretation: Otherwise normal EKG with mild NSST changes  Recent Labs:  None available   ASSESSMENT / PLAN: CAD, multiple vessel No issues since her bypass surgery. No active anginal symptoms. She is on a good dose of beta blocker, ARB, statin and aspirin.  Stable regimen. No changes needed.  Mitral valve anterior leaflet prolapse Relatively mild prolapse. Would not need to recheck echocardiogram for another couple years  S/P CABG x 4 He basically for years out  from her last Myoview stress test. With the lack of symptoms, I think is probably reasonable to wait until next year to recheck her Myoview.  Hyperlipidemia LDL goal <70 She is on a statin. Monitored by PCP.  Essential hypertension Upper normal blood pressure today on ARB and beta blocker. I am reluctant to increase the beta blocker dose because her baseline bradycardia. Watch for orthostatic symptoms.    Orders Placed This Encounter  Procedures  . EKG 12-Lead   Meds ordered this encounter  Medications  . zolpidem (AMBIEN) 10 MG tablet    Sig: Take 10 mg by mouth at bedtime as needed.  . TRAVATAN Z 0.004 % SOLN ophthalmic solution    Sig: Place 1 drop into both eyes at bedtime.   . VOLTAREN 1 % GEL    Sig: Apply topically at bedtime and may repeat dose one time if needed.   . montelukast (SINGULAIR) 10 MG tablet  Sig: Take 10 mg by mouth daily.      Followup: One year   Leonie Man, M.D., M.S. Interventional Cardiologist   Pager # [phone]

## 2014-02-08 ENCOUNTER — Encounter: Payer: Self-pay | Admitting: Cardiology

## 2014-02-08 NOTE — Assessment & Plan Note (Signed)
She is on a statin. Monitored by PCP.

## 2014-02-08 NOTE — Assessment & Plan Note (Signed)
He basically for years out from her last Myoview stress test. With the lack of symptoms, I think is probably reasonable to wait until next year to recheck her Myoview.

## 2014-02-08 NOTE — Assessment & Plan Note (Signed)
Relatively mild prolapse. Would not need to recheck echocardiogram for another couple years

## 2014-02-08 NOTE — Assessment & Plan Note (Signed)
No issues since her bypass surgery. No active anginal symptoms. She is on a good dose of beta blocker, ARB, statin and aspirin.  Stable regimen. No changes needed.

## 2014-02-08 NOTE — Assessment & Plan Note (Signed)
Upper normal blood pressure today on ARB and beta blocker. I am reluctant to increase the beta blocker dose because her baseline bradycardia. Watch for orthostatic symptoms.

## 2014-02-23 ENCOUNTER — Other Ambulatory Visit: Payer: Self-pay | Admitting: Cardiology

## 2014-02-23 NOTE — Telephone Encounter (Signed)
Rx was sent to pharmacy electronically. 

## 2014-02-26 ENCOUNTER — Other Ambulatory Visit: Payer: Self-pay | Admitting: Cardiology

## 2014-02-27 NOTE — Telephone Encounter (Signed)
Coreg refilled #60 tablets with 11 refills on 02/23/2014

## 2014-05-29 DIAGNOSIS — M79643 Pain in unspecified hand: Secondary | ICD-10-CM | POA: Diagnosis not present

## 2014-05-29 DIAGNOSIS — M19041 Primary osteoarthritis, right hand: Secondary | ICD-10-CM | POA: Diagnosis not present

## 2014-05-29 DIAGNOSIS — M19042 Primary osteoarthritis, left hand: Secondary | ICD-10-CM | POA: Diagnosis not present

## 2014-06-01 DIAGNOSIS — H4011X1 Primary open-angle glaucoma, mild stage: Secondary | ICD-10-CM | POA: Diagnosis not present

## 2014-07-12 DIAGNOSIS — N390 Urinary tract infection, site not specified: Secondary | ICD-10-CM | POA: Diagnosis not present

## 2014-07-12 DIAGNOSIS — I10 Essential (primary) hypertension: Secondary | ICD-10-CM | POA: Diagnosis not present

## 2014-07-16 DIAGNOSIS — M1990 Unspecified osteoarthritis, unspecified site: Secondary | ICD-10-CM | POA: Diagnosis not present

## 2014-07-16 DIAGNOSIS — G4700 Insomnia, unspecified: Secondary | ICD-10-CM | POA: Diagnosis not present

## 2014-07-16 DIAGNOSIS — Z23 Encounter for immunization: Secondary | ICD-10-CM | POA: Diagnosis not present

## 2014-07-16 DIAGNOSIS — Z0001 Encounter for general adult medical examination with abnormal findings: Secondary | ICD-10-CM | POA: Diagnosis not present

## 2014-07-16 DIAGNOSIS — I2510 Atherosclerotic heart disease of native coronary artery without angina pectoris: Secondary | ICD-10-CM | POA: Diagnosis not present

## 2014-09-10 ENCOUNTER — Encounter: Payer: Self-pay | Admitting: Cardiology

## 2014-09-10 ENCOUNTER — Ambulatory Visit (INDEPENDENT_AMBULATORY_CARE_PROVIDER_SITE_OTHER): Payer: Self-pay | Admitting: Cardiology

## 2014-09-10 VITALS — BP 160/80 | HR 58 | Ht 64.0 in | Wt 151.9 lb

## 2014-09-10 DIAGNOSIS — I341 Nonrheumatic mitral (valve) prolapse: Secondary | ICD-10-CM

## 2014-09-10 DIAGNOSIS — Z951 Presence of aortocoronary bypass graft: Secondary | ICD-10-CM

## 2014-09-10 DIAGNOSIS — I10 Essential (primary) hypertension: Secondary | ICD-10-CM

## 2014-09-10 DIAGNOSIS — I2510 Atherosclerotic heart disease of native coronary artery without angina pectoris: Secondary | ICD-10-CM

## 2014-09-10 DIAGNOSIS — E785 Hyperlipidemia, unspecified: Secondary | ICD-10-CM

## 2014-09-10 DIAGNOSIS — R0789 Other chest pain: Secondary | ICD-10-CM

## 2014-09-10 NOTE — Progress Notes (Signed)
PCP: Tommy Medal, MD  Clinic Note: Chief Complaint  Patient presents with  . Follow-up    some chest tightness no other concerns  . Coronary Artery Disease  . Mitral Valve Prolapse    HPI: Olivia Gray is a 81 y.o. female with a PMH below who presents today for six-month followup of her CAD and CABG. I last saw her in Oct 2015, she was doing quite well from cardiac standpoint. Plan was re-look Myoview this year.   Past Medical History  Diagnosis Date  . CAD, multiple vessel 2004    Myoview 2011: Mild Basal-mid Inferolateral "ischemia", EF ~76?% -- Anormal, but LOW RISK  . S/P CABG x 4 2004    SVG-LAD, SVG-D1, SVG-OM1, SVG-RPDA. (Native LIMA was 100% occluded)  . Essential hypertension   . Hyperlipidemia LDL goal <70     On statin.  . Diabetes mellitus type II, controlled     Diet controlled  . Retinitis pigmentosa of both eyes      now essentially legally blind.  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 Mitral valve prolapse 02/07/2008; April 2015    a) Mild Anterior MVP, with mild MR.;; b) Echo 07/2013: EF 55-60%, Gr 1 DD, no WMA, Mild central MR with late systolic prolapse     Prior Cardiac Evaluation and Procedure History: Past Surgical History  Procedure Laterality Date  . Nm myocar perf wall motion  04/2009    EF 76%; mild ischeia basal inferolateral,mid inferoinlateral regions(s), abn pharmacologic nuc test, deffect indicte new ischemia,LV syst.fx normal --> cath showing no obstructive targets.  . Doppler echocardiography  02/07/2008; April 2015    EF=>55%; LV normal; mild anterior MVP with mild MR.; b) 07/2013: EF 55-60%. Normal wall motion. Gr 1 DD, mild MR with late systolic prolapse. Normal central venous and pulmonary arterial pressures   . Cardiac catheterization  05/22/2009    all grafts patent ,LV systolic fx normal,small -vessel diabetic disease  in native vessels   . Coronary artery bypass graft  07/21/2002    in [place] ; SVG-LAD, SVG-D1, SVG-OM1, SVG RPDA.  Marland Kitchen Cholecystectomy  02/05/2005    Interval History: She notes having some "unusual twinges" in her chest a few weeks ago that concerned her.  Has not had any symptoms since.  Not related to exertion -- was lying down @ night.    Cardiovascular ROS: positive for - chest pain, murmur and gets tired if goes up steps slowly, but can occasionally get sob negative for - dyspnea on exertion, edema, irregular heartbeat, loss of consciousness, orthopnea, palpitations, paroxysmal nocturnal dyspnea, rapid heart rate, shortness of breath or syncope /near syncope, or TIA/amaurosis fugax.   ROS: A comprehensive was performed. Review of Systems  Constitutional: Negative for malaise/fatigue.  HENT: Positive for congestion (constant nasal congestion). Negative for nosebleeds.   Respiratory: Negative for cough and wheezing.   Cardiovascular: Negative for claudication.  Gastrointestinal: Negative for heartburn, blood in stool and melena.  Genitourinary: Negative for hematuria.  Musculoskeletal: Negative for falls.  Neurological: Positive for headaches. Negative for dizziness.  Endo/Heme/Allergies: Bruises/bleeds easily.  Psychiatric/Behavioral: Negative.   All other systems reviewed and are negative.   Current Outpatient Prescriptions on File Prior to Visit  Medication Sig Dispense Refill  . aspirin 81 MG tablet Take 81 mg by mouth daily.    Marland Kitchen BIOTIN PO Take 1 tablet by mouth daily.    Marland Kitchen  carvedilol (COREG) 3.125 MG tablet TAKE 1 TABLET (3.125 MG TOTAL) BY MOUTH 2 (TWO) TIMES DAILY. 60 tablet 11  . cholecalciferol (VITAMIN D) 1000 UNITS tablet Take 1,000 Units by mouth daily.    Marland Kitchen escitalopram (LEXAPRO) 10 MG tablet Take 10 mg by mouth daily.    Marland Kitchen ipratropium (ATROVENT) 0.06 % nasal spray Place 1 spray into the nose daily.    Marland Kitchen losartan (COZAAR) 100 MG tablet Take 100 mg by mouth daily.     . montelukast (SINGULAIR) 10 MG tablet Take 10 mg by mouth daily.     . simvastatin (ZOCOR) 40 MG tablet Take 20 mg by mouth every evening.     . TRAVATAN Z 0.004 % SOLN ophthalmic solution Place 1 drop into both eyes at bedtime.     . VOLTAREN 1 % GEL Apply topically at bedtime and may repeat dose one time if needed.      No current facility-administered medications on file prior to visit.   No Known Allergies History  Substance Use Topics  . Smoking status: Never Smoker   . Smokeless tobacco: Not on file  . Alcohol Use: No   History reviewed. No pertinent family history. non-contributory with a age & co-morbidities.   Wt Readings from Last 3 Encounters:  09/10/14 68.901 kg (151 lb 14.4 oz)  02/06/14 70.761 kg (156 lb)  07/10/13 72.122 kg (159 lb)  *On purpose - has been exercising & diet modification.  PHYSICAL EXAM BP 160/80 mmHg  Pulse 58  Ht 5\' 4"  (1.626 m)  Wt 68.901 kg (151 lb 14.4 oz)  BMI 26.06 kg/m2 General appearance: alert, cooperative, appears stated age, no distress and Healthy-appearing.  Psych: Normal mood and affect. Answers questions appropriately.  Neck: no adenopathy, no carotid bruit, no JVD, supple, symmetrical, trachea midline and thyroid not enlarged, symmetric, no tenderness/mass/nodules  Lungs: CTAB, normal percussion bilaterally and Nonlabored, good air movement.  Heart: regular rate and rhythm, S1&S2 normal, no rub or gallop and normal apical impulse; soft systolic click with minimal HSM at apex.  Abdomen: soft, non-tender; bowel sounds normal; no masses, no organomegaly  Extremities: extremities normal, atraumatic, no cyanosis or edema  Pulses: 2+ and symmetric  Skin: Skin color, texture, turgor normal. No rashes or lesions  Neurologic: Alert and oriented X 3, normal strength and tone. Normal symmetric reflexes. Normal coordination and gait    Adult ECG Report  Not checked  Recent Labs:  None available   ASSESSMENT / PLAN: Problem List  Items Addressed This Visit    CAD, multiple vessel - Primary (Chronic)    Essentially no active cardiac symptoms since her bypass surgery. She has some atypical chest twinges that are probably musculoskeletal in nature and not cardiac.  She is on carvedilol and losartan along with statin. She takes aspirin without Plavix. Plan his relook Myoview which would be 5 years since her last cath.      Relevant Orders   Myocardial Perfusion Imaging   Chest discomfort    Chest discomfort episodes are probably either may be mild PVCs or muscle skeletal symptoms. I think she may have some twinges from her sternal wires. The episodes are not at all associated with any exertion makes him less likely anginal in nature. Despite this we are checking a Myoview to make sure that no new perfusion defects are found.      Relevant Orders   Myocardial Perfusion Imaging   Essential hypertension (Chronic)    Elevated blood pressure today on  ARB and beta blocker. She is on max dose of Cozaar, and with her sinus bradycardia, I'm reluctant to increase her beta blocker dose.  We will see her blood pressure response is during her Myoview. This will give him one other reading to correlate on as she has not had issues with high blood pressures in previous visits. Would probably need to add another agent versus switch her to a more potent ARB if her blood pressure is elevated.      Relevant Orders   Myocardial Perfusion Imaging   Hyperlipidemia LDL goal <70 (Chronic)    On statin with goal of LDL less than 70. I have not seen any recent labs reviewed and are being monitored by her PCP.      Relevant Orders   Myocardial Perfusion Imaging   Mitral valve anterior leaflet prolapse (Chronic)   Relevant Orders   Myocardial Perfusion Imaging   S/P CABG x 4 (Chronic)    Over 5 years status post last cardiac catheterization and 12 years from her CABG. That was for a stress test that showed abnormal inferior lateral  ischemia/infarct. Hopefully there is no new lesions she will not need another repeat catheterization      Relevant Orders   Myocardial Perfusion Imaging      Orders Placed This Encounter  Procedures  . Myocardial Perfusion Imaging    Standing Status: Future     Number of Occurrences:      Standing Expiration Date: 09/10/2015    Order Specific Question:  Type of stress    Answer:  Lexiscan    Order Specific Question:  Patient weight in lbs    Answer:  151   No orders of the defined types were placed in this encounter.     Followup: One year   Leonie Man, M.D., M.S. Interventional Cardiologist   Pager # [phone]

## 2014-09-10 NOTE — Patient Instructions (Signed)
KEEP TAKING YOUR CURRENT MEDICATIONS  Your physician has requested that you have a lexiscan myoview. For further information please visit [email or web address]. Please follow instruction sheet, as given.  Your physician wants you to follow-up in OCT 2016 ([place]) DR HARDING. IF TEST IS ABNORMAL WE WILL SEE YOU SOONER.  You will receive a reminder letter in the mail two months in advance. If you don't receive a letter, please call our office to schedule the follow-up appointment.

## 2014-09-12 ENCOUNTER — Encounter: Payer: Self-pay | Admitting: Cardiology

## 2014-09-12 NOTE — Assessment & Plan Note (Signed)
Over 5 years status post last cardiac catheterization and 12 years from her CABG. That was for a stress test that showed abnormal inferior lateral ischemia/infarct. Hopefully there is no new lesions she will not need another repeat catheterization

## 2014-09-12 NOTE — Assessment & Plan Note (Signed)
Elevated blood pressure today on ARB and beta blocker. She is on max dose of Cozaar, and with her sinus bradycardia, I'm reluctant to increase her beta blocker dose.  We will see her blood pressure response is during her Myoview. This will give him one other reading to correlate on as she has not had issues with high blood pressures in previous visits. Would probably need to add another agent versus switch her to a more potent ARB if her blood pressure is elevated.

## 2014-09-12 NOTE — Assessment & Plan Note (Signed)
Chest discomfort episodes are probably either may be mild PVCs or muscle skeletal symptoms. I think she may have some twinges from her sternal wires. The episodes are not at all associated with any exertion makes him less likely anginal in nature. Despite this we are checking a Myoview to make sure that no new perfusion defects are found.

## 2014-09-12 NOTE — Assessment & Plan Note (Signed)
Essentially no active cardiac symptoms since her bypass surgery. She has some atypical chest twinges that are probably musculoskeletal in nature and not cardiac.  She is on carvedilol and losartan along with statin. She takes aspirin without Plavix. Plan his relook Myoview which would be 5 years since her last cath.

## 2014-09-12 NOTE — Assessment & Plan Note (Signed)
On statin with goal of LDL less than 70. I have not seen any recent labs reviewed and are being monitored by her PCP.

## 2014-09-21 ENCOUNTER — Telehealth (HOSPITAL_COMMUNITY): Payer: Self-pay

## 2014-09-21 NOTE — Telephone Encounter (Signed)
Encounter complete. 

## 2014-09-25 ENCOUNTER — Telehealth (HOSPITAL_COMMUNITY): Payer: Self-pay

## 2014-09-25 NOTE — Telephone Encounter (Signed)
Encounter complete. 

## 2014-09-26 ENCOUNTER — Ambulatory Visit (HOSPITAL_COMMUNITY)
Admission: RE | Admit: 2014-09-26 | Discharge: 2014-09-26 | Disposition: A | Discharge status: Home / Self Care | Payer: Medicare Other | Source: Ambulatory Visit | Attending: Cardiology | Admitting: Cardiology

## 2014-09-26 DIAGNOSIS — I10 Essential (primary) hypertension: Secondary | ICD-10-CM

## 2014-09-26 DIAGNOSIS — E785 Hyperlipidemia, unspecified: Secondary | ICD-10-CM

## 2014-09-26 DIAGNOSIS — I2510 Atherosclerotic heart disease of native coronary artery without angina pectoris: Secondary | ICD-10-CM | POA: Diagnosis not present

## 2014-09-26 DIAGNOSIS — R0789 Other chest pain: Secondary | ICD-10-CM | POA: Diagnosis not present

## 2014-09-26 DIAGNOSIS — I341 Nonrheumatic mitral (valve) prolapse: Secondary | ICD-10-CM

## 2014-09-26 DIAGNOSIS — Z951 Presence of aortocoronary bypass graft: Secondary | ICD-10-CM

## 2014-09-26 HISTORY — PX: NM MYOVIEW LTD: HXRAD82

## 2014-09-26 LAB — MYOCARDIAL PERFUSION IMAGING
CHL CUP NUCLEAR SDS: 2
CHL CUP NUCLEAR SRS: 0
CHL CUP NUCLEAR SSS: 2
CHL CUP STRESS STAGE 1 DBP: 75 mmHg
CHL CUP STRESS STAGE 1 GRADE: 0 %
CHL CUP STRESS STAGE 1 HR: 46 {beats}/min
CHL CUP STRESS STAGE 1 SPEED: 0 mph
CHL CUP STRESS STAGE 2 SPEED: 0 mph
CHL CUP STRESS STAGE 3 GRADE: 0 %
CHL CUP STRESS STAGE 3 SPEED: 0 mph
CHL CUP STRESS STAGE 4 GRADE: 0 %
CHL CUP STRESS STAGE 4 HR: 60 {beats}/min
CSEPPHR: 55 {beats}/min
CSEPPMHR: 39 %
Estimated workload: 1 METS
LV dias vol: 105 mL
LV sys vol: 47 mL
NUC STRESS EF: 55 %
Rest HR: 47 {beats}/min
Stage 1 SBP: 149 mmHg
Stage 2 Grade: 0 %
Stage 2 HR: 46 {beats}/min
Stage 3 HR: 55 {beats}/min
Stage 4 Speed: 0 mph
TID: 1.12

## 2014-09-26 MED ORDER — TECHNETIUM TC 99M SESTAMIBI GENERIC - CARDIOLITE
10.8000 | Freq: Once | INTRAVENOUS | Status: AC | PRN
  Administered 2014-09-26: 11 via INTRAVENOUS

## 2014-09-26 MED ORDER — REGADENOSON 0.4 MG/5ML IV SOLN
0.4000 mg | Freq: Once | INTRAVENOUS | Status: AC
  Administered 2014-09-26: 0.4 mg via INTRAVENOUS

## 2014-09-26 MED ORDER — TECHNETIUM TC 99M SESTAMIBI GENERIC - CARDIOLITE
31.0000 | Freq: Once | INTRAVENOUS | Status: AC | PRN
  Administered 2014-09-26: 31 via INTRAVENOUS

## 2014-09-27 NOTE — Progress Notes (Signed)
Quick Note:  ECG is abnormal; but Images look good. No sign of large Vessel ischemia. Normal function.  Leonie Man, MD  ______

## 2014-09-28 ENCOUNTER — Telehealth: Payer: Self-pay | Admitting: *Deleted

## 2014-09-28 NOTE — Telephone Encounter (Signed)
-----   Message from Leonie Man, MD sent at 09/27/2014  6:47 PM EDT ----- ECG is abnormal; but Images look good.  No sign of large Vessel ischemia. Normal function.  Leonie Man, MD

## 2014-09-28 NOTE — Telephone Encounter (Signed)
Spoke to patient. Result given . Verbalized understanding  

## 2014-10-03 DIAGNOSIS — R350 Frequency of micturition: Secondary | ICD-10-CM | POA: Diagnosis not present

## 2014-10-03 DIAGNOSIS — R351 Nocturia: Secondary | ICD-10-CM | POA: Diagnosis not present

## 2015-01-06 ENCOUNTER — Other Ambulatory Visit: Payer: Self-pay | Admitting: Cardiology

## 2015-01-07 NOTE — Telephone Encounter (Signed)
Rx request sent to pharmacy.  

## 2015-02-09 ENCOUNTER — Other Ambulatory Visit: Payer: Self-pay | Admitting: Cardiology

## 2015-02-11 ENCOUNTER — Telehealth: Payer: Self-pay | Admitting: Cardiology

## 2015-02-11 ENCOUNTER — Ambulatory Visit (INDEPENDENT_AMBULATORY_CARE_PROVIDER_SITE_OTHER): Payer: Medicare Other | Admitting: Cardiology

## 2015-02-11 ENCOUNTER — Encounter: Payer: Self-pay | Admitting: Cardiology

## 2015-02-11 VITALS — BP 178/74 | HR 54 | Ht 64.0 in | Wt 165.0 lb

## 2015-02-11 DIAGNOSIS — R0789 Other chest pain: Secondary | ICD-10-CM

## 2015-02-11 DIAGNOSIS — E785 Hyperlipidemia, unspecified: Secondary | ICD-10-CM | POA: Diagnosis not present

## 2015-02-11 DIAGNOSIS — I10 Essential (primary) hypertension: Secondary | ICD-10-CM | POA: Diagnosis not present

## 2015-02-11 DIAGNOSIS — I2510 Atherosclerotic heart disease of native coronary artery without angina pectoris: Secondary | ICD-10-CM | POA: Diagnosis not present

## 2015-02-11 MED ORDER — NITROGLYCERIN 0.4 MG SL SUBL: 0.4000 mg | SUBLINGUAL_TABLET | SUBLINGUAL | Status: DC | PRN

## 2015-02-11 MED ORDER — IRBESARTAN 300 MG PO TABS: 300.0000 mg | ORAL_TABLET | Freq: Every day | ORAL | Status: DC

## 2015-02-11 NOTE — Telephone Encounter (Signed)
Returned call to patient.She stated she has been having chest pain off and on for the past week.Stated she had chest pain last night.No chest pain at present.Appointment scheduled this afternoon at 2:15 pm with Dr.Harding.Advised if she has any chest pain before appointment she should go to ER.

## 2015-02-11 NOTE — Patient Instructions (Signed)
START IRBESARTAN 300 MG ( AVAPRO) ONE TABLET BY MOUTH DAILY.  YOU ARE HAVING MUSCULOSKELETAL PAIN  NO OTHER CHANGES AT PRESENT.   Your physician wants you to follow-up in April 2017 with Dr Ellyn Hack.  You will receive a reminder letter in the mail two months in advance. If you don't receive a letter, please call our office to schedule the follow-up appointment.

## 2015-02-11 NOTE — Telephone Encounter (Signed)
Per Answering service-Pt Having Chest Pains,please call asap.

## 2015-02-11 NOTE — Progress Notes (Signed)
PCP: [name], MD  Clinic Note: Chief Complaint  Patient presents with  . Chest Pain  . Coronary Artery Disease    f/u for recent myoview  -- MSK R sided CP   HPI: Olivia Gray is a 81 y.o. female with a PMH below who presents today for six-month followup of her CAD and CABG.  I last saw her in May 2016 - ordered Myoview for surveillance. she was doing quite well from cardiac standpoint.   Myoview June 2016: Myocardial perfusion is normal. The study is normal. This is a low risk study. Overall left ventricular systolic function was normal. LV cavity size is normal. The left ventricular ejection fraction is normal (55%). Compared to the prior study, there are changes. - ischemia is no longer present.    Past Medical History  Diagnosis Date  . CAD, multiple vessel 2004    Myoview 2011: Mild Basal-mid Inferolateral "ischemia", EF ~76?% -- Anormal, but LOW RISK  . S/P CABG x 4 2004    SVG-LAD, SVG-D1, SVG-OM1, SVG-RPDA. (Native LIMA was 100% occluded)  . Essential hypertension   . Hyperlipidemia LDL goal <70     On statin.  . Diabetes mellitus type II, controlled ([place])     Diet controlled  . Retinitis pigmentosa of both eyes      now essentially legally blind.  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 Mitral valve prolapse 02/07/2008; April 2015    a) Mild Anterior MVP, with mild MR.;; b) Echo 07/2013: EF 55-60%, Gr 1 DD, no WMA, Mild central MR with late systolic prolapse     Prior Cardiac Evaluation and Procedure History: Past Surgical History  Procedure Laterality Date  . Nm myocar perf wall motion  04/2009    EF 76%; mild ischeia basal inferolateral,mid inferoinlateral regions(s), abn pharmacologic nuc test, deffect indicte new ischemia,LV syst.fx normal --> cath showing no obstructive targets.  . Doppler echocardiography  02/07/2008; April 2015   EF=>55%; LV normal; mild anterior MVP with mild MR.; b) 07/2013: EF 55-60%. Normal wall motion. Gr 1 DD, mild MR with late systolic prolapse. Normal central venous and pulmonary arterial pressures   . Cardiac catheterization  05/22/2009    all grafts patent ,LV systolic fx normal,small -vessel diabetic disease in native vessels   . Coronary artery bypass graft  07/21/2002    in [place] ; SVG-LAD, SVG-D1, SVG-OM1, SVG RPDA.  Marland Kitchen Cholecystectomy  02/05/2005    Interval History: She presents to day as a "work-in" patient for intermittent chest pain.  Pain is mostly @ rest, but can be made worse with certain movements.  -- similar to her twinges noted last visit -but more pronounced   She notes these sharp pinching sensations mostly along the R costo-sternal margin that will radiate to her back.  Episodes last seconds (not > 1 min).  Maybe has 1-2 per week --> most recently was yesterday in AM, was just seated. Despite this, she does not notice discomfort when doing her daily exercises ~30-45 min/day -- situps, light weighs, stretching etc. Symptoms are not associated with dyspnea or diaphoresis & not similar to previous anginal Sx.  Cardiovascular ROS: positive for - chest pain, murmur and gets tired if goes up steps slowly, but can occasionally get sob negative for - dyspnea on exertion, edema, irregular heartbeat, loss of consciousness, orthopnea, palpitations, paroxysmal nocturnal dyspnea, rapid heart rate,  shortness of breath or syncope /near syncope, or TIA/amaurosis fugax.   ROS: A comprehensive was performed. Review of Systems  Constitutional: Negative for malaise/fatigue.  HENT: Positive for congestion (constant nasal congestion). Negative for nosebleeds.   Respiratory: Negative for cough and wheezing.   Cardiovascular: Negative for claudication.  Gastrointestinal: Negative for heartburn, blood in stool and melena.  Genitourinary: Negative for hematuria.  Musculoskeletal: Negative for  falls.  Neurological: Positive for headaches. Negative for dizziness.  Endo/Heme/Allergies: Bruises/bleeds easily.  Psychiatric/Behavioral: Negative.   All other systems reviewed and are negative.   Current Outpatient Prescriptions on File Prior to Visit  Medication Sig Dispense Refill  . aspirin 81 MG tablet Take 81 mg by mouth daily.    Marland Kitchen BIOTIN PO Take 5,000 Units by mouth daily.     . cholecalciferol (VITAMIN D) 1000 UNITS tablet Take 1,000 Units by mouth daily.    Marland Kitchen escitalopram (LEXAPRO) 10 MG tablet Take 10 mg by mouth daily.    Marland Kitchen ipratropium (ATROVENT) 0.06 % nasal spray Place 1 spray into the nose daily.    . simvastatin (ZOCOR) 40 MG tablet Take 20 mg by mouth every evening.     . TRAVATAN Z 0.004 % SOLN ophthalmic solution Place 1 drop into both eyes at bedtime.     . VOLTAREN 1 % GEL Apply topically at bedtime and may repeat dose one time if needed.     . carvedilol (COREG) 3.125 MG tablet Take 1 tablet (3.125 mg total) by mouth 2 (two) times daily. 60 tablet 11   No current facility-administered medications on file prior to visit.   No Known Allergies Social History  Substance Use Topics  . Smoking status: Never Smoker   . Smokeless tobacco: None  . Alcohol Use: No   History reviewed. No pertinent family history. non-contributory with a age & co-morbidities.   Wt Readings from Last 3 Encounters:  02/11/15 165 lb (74.844 kg)  09/26/14 151 lb (68.493 kg)  09/10/14 151 lb 14.4 oz (68.901 kg)  *On purpose - has been exercising & diet modification.  PHYSICAL EXAM BP 178/74 mmHg  Pulse 54  Ht 5\' 4"  (1.626 m)  Wt 165 lb (74.844 kg)  BMI 28.31 kg/m2 General appearance: alert, cooperative, appears stated age, no distress and Healthy-appearing.  Psych: Normal mood and affect. Answers questions appropriately.  Neck: no adenopathy, no carotid bruit, no JVD, supple, symmetrical, trachea midline and thyroid not enlarged, symmetric, no tenderness/mass/nodules  Lungs: CTAB,  normal percussion bilaterally and Nonlabored, good air movement.  Heart: regular rate and rhythm, S1&S2 normal, no rub or gallop and normal apical impulse; soft systolic click with minimal HSM at apex. -- reproducible CP on exam along R side of chest. Abdomen: soft, non-tender; bowel sounds normal; no masses, no organomegaly  Extremities: extremities normal, atraumatic, no cyanosis or edema  Pulses: 2+ and symmetric  Skin: Skin color, texture, turgor normal. No rashes or lesions  Neurologic: Alert and oriented X 3, normal strength and tone. Normal symmetric reflexes. Normal coordination and gait    Adult ECG Report  Sinus Bradycardia - 54 bpm, ~ L Atrial enlargement, LVH with nos-specific ST-T wave abnormality.  Normal Axis, intervals & durations.   Stable EKG with no ischemic changes.  Recent Labs:  None available   ASSESSMENT / PLAN: Problem List Items Addressed This Visit    Musculoskeletal chest pain (Chronic)    Reproducible chest pain - not exertional with negative myoview. Recommend analgesics & heat.  Hyperlipidemia LDL goal <70 (Chronic)    On statin.  Monitored by PCP      Relevant Medications   irbesartan (AVAPRO) 300 MG tablet   nitroGLYCERIN (NITROSTAT) 0.4 MG SL tablet   Other Relevant Orders   EKG 12-Lead (Completed)   Essential hypertension - Primary (Chronic)    Poorly controlled - change ARB to Avapro for improved efficacy.      Relevant Medications   irbesartan (AVAPRO) 300 MG tablet   nitroGLYCERIN (NITROSTAT) 0.4 MG SL tablet   Other Relevant Orders   EKG 12-Lead (Completed)   CAD, multiple vessel (Chronic)    I do not feel that her current symptoms are related to CAD.  Most likely MSK pain & non-anginal.  Relook Myoview without ischemia. On BB & ARB along with statin.  ASA alone.      Relevant Medications   irbesartan (AVAPRO) 300 MG tablet   nitroGLYCERIN (NITROSTAT) 0.4 MG SL tablet   Other Relevant Orders   EKG 12-Lead (Completed)       Orders Placed This Encounter  Procedures  . EKG 12-Lead   Meds ordered this encounter  Medications  . cyanocobalamin 1000 MCG tablet    Sig: Take 100 mcg by mouth daily.  . irbesartan (AVAPRO) 300 MG tablet    Sig: Take 1 tablet (300 mg total) by mouth daily.    Dispense:  30 tablet    Refill:  11  . nitroGLYCERIN (NITROSTAT) 0.4 MG SL tablet    Sig: Place 1 tablet (0.4 mg total) under the tongue every 5 (five) minutes as needed for chest pain.    Dispense:  25 tablet    Refill:  6    Change to Avapro 300 mg; prn NTG  Followup: Spring 2016   Leonie Man, M.D., M.S. Interventional Cardiologist   Pager # [phone]

## 2015-02-11 NOTE — Telephone Encounter (Signed)
Pt c/o of Chest Pain: STAT if CP now or developed within 24 hours  1. Are you having CP right now? No   2. Are you experiencing any other symptoms (ex. SOB, nausea, vomiting, sweating)? Husband did not say   3. How long have you been experiencing CP? Since last night   4. Is your CP continuous or coming and going? Coming and going   5. Have you taken Nitroglycerin? She does not take NTG ?

## 2015-02-13 ENCOUNTER — Encounter: Payer: Self-pay | Admitting: Cardiology

## 2015-02-13 NOTE — Assessment & Plan Note (Addendum)
Poorly controlled - change ARB to Avapro for improved efficacy.

## 2015-02-13 NOTE — Assessment & Plan Note (Signed)
Reproducible chest pain - not exertional with negative myoview. Recommend analgesics & heat.

## 2015-02-13 NOTE — Assessment & Plan Note (Signed)
On statin. Monitored by PCP. 

## 2015-02-13 NOTE — Assessment & Plan Note (Signed)
I do not feel that her current symptoms are related to CAD.  Most likely MSK pain & non-anginal.  Relook Myoview without ischemia. On BB & ARB along with statin.  ASA alone.

## 2015-04-28 DIAGNOSIS — Z923 Personal history of irradiation: Secondary | ICD-10-CM

## 2015-04-28 HISTORY — DX: Personal history of irradiation: Z92.3

## 2015-08-13 ENCOUNTER — Ambulatory Visit (INDEPENDENT_AMBULATORY_CARE_PROVIDER_SITE_OTHER): Payer: Medicare Other | Admitting: Cardiology

## 2015-08-13 ENCOUNTER — Telehealth: Payer: Self-pay | Admitting: *Deleted

## 2015-08-13 VITALS — BP 168/74 | HR 49 | Ht 66.0 in | Wt 150.6 lb

## 2015-08-13 DIAGNOSIS — I10 Essential (primary) hypertension: Secondary | ICD-10-CM

## 2015-08-13 DIAGNOSIS — E785 Hyperlipidemia, unspecified: Secondary | ICD-10-CM | POA: Diagnosis not present

## 2015-08-13 DIAGNOSIS — Z951 Presence of aortocoronary bypass graft: Secondary | ICD-10-CM

## 2015-08-13 DIAGNOSIS — I341 Nonrheumatic mitral (valve) prolapse: Secondary | ICD-10-CM

## 2015-08-13 DIAGNOSIS — I2510 Atherosclerotic heart disease of native coronary artery without angina pectoris: Secondary | ICD-10-CM | POA: Diagnosis not present

## 2015-08-13 DIAGNOSIS — R0789 Other chest pain: Secondary | ICD-10-CM

## 2015-08-13 MED ORDER — AMLODIPINE BESYLATE 5 MG PO TABS: 5.0000 mg | ORAL_TABLET | Freq: Every day | ORAL | Status: DC

## 2015-08-13 NOTE — Progress Notes (Signed)
PCP: [name], MD  Clinic Note: Chief Complaint  Patient presents with  . Follow-up    6 Month, pt denied chest pain and SOB  . Coronary Artery Disease    HPI: Olivia Gray is a 82 y.o. female with a PMH below who presents today for 6 month f/u of her CAD-CABG & HTN IMay 2016 - ordered Myoview for surveillance. she was doing quite well from cardiac standpoint.  Olivia Gray was last seen in Oct 2016 for sharp chest pains.This discomfort was relatively reproducible and thought to be musculoskeletal in nature.  She had just had a Myoview stress test in June that was relatively normal with no evidence of ischemia. Normal EF.  Recent Hospitalizations: None  Studies Reviewed: None  Interval History: Olivia Gray presents today doing fairly well. She really has not had any significant symptoms. Since I saw her in October she is not noted any further chest discomfort. Cardiac review of symptoms as follows:  No chest pain or shortness of breath with rest or exertion. No PND, orthopnea or edema. No palpitations, lightheadedness, dizziness, weakness or syncope/near syncope. No TIA/amaurosis fugax symptoms. No melena, hematochezia, hematuria, or epstaxis. No claudication.  ROS: A comprehensive was performed. Review of Systems  Constitutional: Negative for malaise/fatigue.  HENT: Positive for congestion. Negative for nosebleeds.   Respiratory: Positive for cough (With bad congestion).   Gastrointestinal: Negative for constipation, blood in stool and melena.  Genitourinary: Negative for hematuria.  Musculoskeletal: Positive for back pain and joint pain. Negative for myalgias and falls.  Neurological: Positive for headaches (With nasal congestion). Negative for dizziness.  Endo/Heme/Allergies: Does not bruise/bleed easily.  Psychiatric/Behavioral: Positive for depression (Pretty well-controlled with Lexapro.).  All other systems reviewed and are negative.   Past Medical History    Diagnosis Date  . CAD, multiple vessel 2004    Myoview 2011: Mild Basal-mid Inferolateral "ischemia", EF ~76?% -- Anormal, but LOW RISK  . S/P CABG x 4 2004    SVG-LAD, SVG-D1, SVG-OM1, SVG-RPDA. (Native LIMA was 100% occluded)  . Essential hypertension   . Hyperlipidemia LDL goal <70     On statin.  . Diabetes mellitus type II, controlled ([place])     Diet controlled  . Retinitis pigmentosa of both eyes      now essentially legally blind.  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 Mitral valve prolapse 02/07/2008; April 2015    a) Mild Anterior MVP, with mild MR.;; b) Echo 07/2013: EF 55-60%, Gr 1 DD, no WMA, Mild central MR with late systolic prolapse     Past Surgical History  Procedure Laterality Date  . Nm myocar perf wall motion  04/2009    EF 76%; mild ischeia basal inferolateral,mid inferoinlateral regions(s), abn pharmacologic nuc test, deffect indicte new ischemia,LV syst.fx normal --> cath showing no obstructive targets.  . Doppler echocardiography  02/07/2008; April 2015    EF=>55%; LV normal; mild anterior MVP with mild MR.; b) 07/2013: EF 55-60%. Normal wall motion. Gr 1 DD, mild MR with late systolic prolapse. Normal central venous and pulmonary arterial pressures   . Cardiac catheterization  05/22/2009    all grafts patent ,LV systolic fx normal,small -vessel diabetic disease in native vessels   . Coronary artery bypass graft  07/21/2002    in [place] ; SVG-LAD, SVG-D1, SVG-OM1, SVG RPDA.  Marland Kitchen Cholecystectomy  02/05/2005  . Nm myoview  ltd  June 2016    Normal perfusion. Normal study. LOW RISK. Normal EF (55%). Compared to prior study, no ischemic changes present.   Prior to Admission medications   Medication Sig Start Date End Date Taking? Authorizing Provider  aspirin 81 MG tablet Take 81 mg by mouth daily.   Yes Historical Provider, MD  carvedilol (COREG)  3.125 MG tablet Take 1 tablet (3.125 mg total) by mouth 2 (two) times daily. 02/11/15  Yes Leonie Man, MD  cholecalciferol (VITAMIN D) 1000 UNITS tablet Take 1,000 Units by mouth daily.   Yes Historical Provider, MD  cyanocobalamin 1000 MCG tablet Take 100 mcg by mouth daily.   Yes Historical Provider, MD  escitalopram (LEXAPRO) 10 MG tablet Take 10 mg by mouth daily.   Yes Historical Provider, MD  ipratropium (ATROVENT) 0.06 % nasal spray Place 1 spray into the nose daily. 06/26/13  Yes Historical Provider, MD  irbesartan (AVAPRO) 300 MG tablet Take 1 tablet (300 mg total) by mouth daily. 02/11/15  Yes Leonie Man, MD  latanoprost (XALATAN) 0.005 % ophthalmic solution Place 1 drop into both eyes at bedtime. 08/12/15  Yes Historical Provider, MD  nitroGLYCERIN (NITROSTAT) 0.4 MG SL tablet Place 1 tablet (0.4 mg total) under the tongue every 5 (five) minutes as needed for chest pain. 02/11/15  Yes Leonie Man, MD  simvastatin (ZOCOR) 20 MG tablet Take 10 mg by mouth daily.   Yes Historical Provider, MD  VOLTAREN 1 % GEL Apply topically at bedtime and may repeat dose one time if needed.  01/23/14  Yes Historical Provider, MD   No Known Allergies   Social History   Social History  . Marital Status: Married    Spouse Name: N/A  . Number of Children: N/A  . Years of Education: N/A   Social History Main Topics  . Smoking status: Never Smoker   . Smokeless tobacco: None  . Alcohol Use: No  . Drug Use: No  . Sexual Activity: Yes    Birth Control/ Protection: Post-menopausal   Other Topics Concern  . None   Social History Narrative   She is married with 2 children. Does not smoke or drink alcohol. She does routine exercise roughly 3-4 days a week where she follows a   The video. Weights stomach exercises jumping jacks and other aerobic type exercises.   History reviewed. No pertinent family history.   Wt Readings from Last 3 Encounters:  08/13/15 150 lb 9.6 oz (68.312 kg)    02/11/15 165 lb (74.844 kg)  09/26/14 151 lb (68.493 kg)    PHYSICAL EXAM BP 168/74 mmHg  Pulse 49  Ht 5\' 6"  (1.676 m)  Wt 150 lb 9.6 oz (68.312 kg)  BMI 24.32 kg/m2 General appearance: alert, cooperative, appears stated age, no distress and Healthy-appearing.  Psych: Normal mood and affect. Answers questions appropriately.  Neck: no adenopathy, no carotid bruit, no JVD, supple, symmetrical, trachea midline and thyroid not enlarged, symmetric, no tenderness/mass/nodules  Lungs: CTAB, normal percussion bilaterally and Nonlabored, good air movement.  Heart: regular rate and rhythm, S1&S2 normal, no rub or gallop and normal apical impulse; soft systolic click with minimal HSM at apex. -- reproducible CP on exam along R side of chest. Abdomen: soft, non-tender; bowel sounds normal; no masses, no organomegaly  Extremities: extremities normal, atraumatic, no cyanosis or edema  Pulses: 2+ and symmetric  Skin: Skin color, texture, turgor normal. No rashes or lesions  Neurologic: Alert and oriented X 3, normal  strength and tone. Normal symmetric reflexes. Normal coordination and gait     Adult ECG Report  Rate: 49 ;  Rhythm: sinus bradycardia and Borderline 1 A-V block (PR interval 194). Otherwise nonspecific T-wave inversions.;   Narrative Interpretation: Stable EKG.   Other studies Reviewed: Additional studies/ records that were reviewed today include:  Recent Labs:  PCP  Lab Results  Component Value Date   CREATININE 0.80 07/31/2011   No results found for: CHOL, HDL, LDLCALC, LDLDIRECT, TRIG, CHOLHDL    ASSESSMENT / PLAN: Problem List Items Addressed This Visit    S/P CABG x 4 (Chronic)   Relevant Orders   EKG 12-Lead (Completed)   Musculoskeletal chest pain (Chronic)    Staying active and exercising is crucial. Currently this doesn't seem to be a major issue, unless she is pushing the cart around.      Mitral valve anterior leaflet prolapse (Chronic)    Not  noted significant on echocardiogram. Would need to continue to monitor. No significant exam findings either.      Relevant Medications   simvastatin (ZOCOR) 20 MG tablet   amLODipine (NORVASC) 5 MG tablet   Hyperlipidemia LDL goal <70 (Chronic)    On simvastatin at now reduced dose. Should be due for repeat lipids      Relevant Medications   simvastatin (ZOCOR) 20 MG tablet   amLODipine (NORVASC) 5 MG tablet   Other Relevant Orders   EKG 12-Lead (Completed)   Essential hypertension - Primary (Chronic)    Poorly controlled. I will add amlodipine (starting as a taper) initially for treatment dosing. This can then be followed up either by myself or Tommy Medal, RPH-CCP. Continue Avapro and carvedilol.      Relevant Medications   simvastatin (ZOCOR) 20 MG tablet   amLODipine (NORVASC) 5 MG tablet   Other Relevant Orders   EKG 12-Lead (Completed)   CAD, multiple vessel (Chronic)    Doing fairly well status post CABG. Now had a Myoview stress test with no ischemia. No further musculoskeletal type chest pain. She is on stable low dose of carvedilol on Avapro. She is taking a statin at 10 mg as opposed to 20 mg. She is on aspirin without any other antiplatelet agent.       Relevant Medications   simvastatin (ZOCOR) 20 MG tablet   amLODipine (NORVASC) 5 MG tablet   Other Relevant Orders   EKG 12-Lead (Completed)      Current medicines are reviewed at length with the patient today. (+/- concerns) n/a The following changes have been made:  START AMLODIPINE 5 MG FOR THE FIRST 12 DAYS TAKE 1/2 TABLET DAILY , THEN TAKE 1 TABLET DAILY.  NO OTHER CHANGES WITH CURRENT MEDICATIONS   Your physician recommends that you schedule a follow-up appointment in [place].( OK TO BE SEEN EARLIER ,PATIENT WILL BE OUT OF TOWN @2  MONTHS )  Your physician wants you to follow-up in [place] DR Orma [place].   Studies Ordered:   Orders Placed This  Encounter  Procedures  . EKG 12-Lead      Leonie Man, M.D., M.S. Interventional Cardiologist   Pager # [phone] Phone # [phone] [address]. [place], [place] [address]

## 2015-08-13 NOTE — Telephone Encounter (Signed)
Patient wanted to know if she should continue taking AVAPRO Along with the medication ( amlodipine) that was started today  RN informed patient she is to continue taking both medications  Amlodipine and Avapro Patient verbalized understanding.

## 2015-08-13 NOTE — Patient Instructions (Addendum)
START AMLODIPINE 5 MG FOR THE FIRST 12 DAYS TAKE 1/2 TABLET DAILY , THEN TAKE 1 TABLET DAILY.  NO OTHER CHANGES WITH CURRENT MEDICATIONS   Your physician recommends that you schedule a follow-up appointment in [place].( OK TO BE SEEN EARLIER ,PATIENT WILL BE OUT OF TOWN @2  MONTHS )  Your physician wants you to follow-up in [place] DR [place]. You will receive a reminder letter in the mail two months in advance. If you don't receive a letter, please call our office to schedule the follow-up appointment.  If you need a refill on your cardiac medications before your next appointment, please call your pharmacy.

## 2015-08-13 NOTE — Telephone Encounter (Signed)
Per message left by the patient on the refill voicemail, she has questions about her medications. Please call at [phone]. Thanks, MI

## 2015-08-15 ENCOUNTER — Encounter: Payer: Self-pay | Admitting: Cardiology

## 2015-08-15 NOTE — Assessment & Plan Note (Signed)
Staying active and exercising is crucial. Currently this doesn't seem to be a major issue, unless she is pushing the cart around.

## 2015-08-15 NOTE — Assessment & Plan Note (Signed)
On simvastatin at now reduced dose. Should be due for repeat lipids

## 2015-08-15 NOTE — Assessment & Plan Note (Signed)
Doing fairly well status post CABG. Now had a Myoview stress test with no ischemia. No further musculoskeletal type chest pain. She is on stable low dose of carvedilol on Avapro. She is taking a statin at 10 mg as opposed to 20 mg. She is on aspirin without any other antiplatelet agent.

## 2015-08-15 NOTE — Assessment & Plan Note (Signed)
Not noted significant on echocardiogram. Would need to continue to monitor. No significant exam findings either.

## 2015-08-15 NOTE — Assessment & Plan Note (Addendum)
Poorly controlled. I will add amlodipine (starting as a taper) initially for treatment dosing. This can then be followed up either by myself or Tommy Medal, RPH-CCP. Continue Avapro and carvedilol.

## 2015-09-12 ENCOUNTER — Ambulatory Visit (INDEPENDENT_AMBULATORY_CARE_PROVIDER_SITE_OTHER): Payer: Medicare Other | Admitting: Pharmacist Clinician (PhC)/ Clinical Pharmacy Specialist

## 2015-09-12 ENCOUNTER — Encounter: Payer: Self-pay | Admitting: Pharmacist Clinician (PhC)/ Clinical Pharmacy Specialist

## 2015-09-12 VITALS — BP 162/80 | HR 64 | Ht 66.0 in | Wt 153.0 lb

## 2015-09-12 DIAGNOSIS — I10 Essential (primary) hypertension: Secondary | ICD-10-CM | POA: Diagnosis not present

## 2015-09-12 MED ORDER — AMLODIPINE BESYLATE 10 MG PO TABS: 10.0000 mg | ORAL_TABLET | Freq: Every day | ORAL | Status: DC

## 2015-09-12 NOTE — Assessment & Plan Note (Signed)
Today BP remains elevated at 162/80, with no significant postural change.   Will have her increase amlodipine to 10 mg daily.  Asked that she start doing home BP readings several days per week and record on log.  I will see her back in about 2 months, as she will be in [place] until early July.

## 2015-09-12 NOTE — Progress Notes (Signed)
09/12/2015 Olivia Gray 09-Nov-1932 [id number]   HPI:  Olivia Gray is a 82 y.o. female patient of Dr Ellyn Hack, with a [place] below who presents today for hypertension clinic evaluation.  She was seen by Dr. Ellyn Hack last month and started on amlodipine to help control her BP.  She took 2.5 mg x 2 weeks then increased to 5 mg daily.  She reports no problems with this.  Today she is here with her husband, and tells me they will be leaving soon to spend 4-6 weeks up in [place], [place] with family.  Cardiac Hx: CABG x 4 (2004 in [place]); CAD, HLD, HTN,   Family Hx: father died from MI at 17, mother at 70 from DM; no other significant history  Social Hx: no tobacco or alcohol, drinks only 1 cup of coffee per day  Diet: eats out maybe half of her meals; does add salt when cooking at home, but not at table  Exercise: states she does daily exercises at home - various stretches and such; walks 2-3 days per week  Home BP readings: has newer cuff, but hasn't checked recently  Current antihypertensive medications: amlodipine 5 mg qd, irbesartan 300 mg qd, carvedilol 3.125 mg bid  Intolerances: none  Wt Readings from Last 3 Encounters:  09/12/15 153 lb (69.4 kg)  08/13/15 150 lb 9.6 oz (68.312 kg)  02/11/15 165 lb (74.844 kg)   BP Readings from Last 3 Encounters:  09/12/15 162/80  08/13/15 168/74  02/11/15 178/74   Pulse Readings from Last 3 Encounters:  09/12/15 64  08/13/15 49  02/11/15 54    Current Outpatient Prescriptions  Medication Sig Dispense Refill  . amLODipine (NORVASC) 5 MG tablet Take 1 tablet (5 mg total) by mouth daily. 30 tablet 11  . aspirin 81 MG tablet Take 81 mg by mouth daily.    . carvedilol (COREG) 3.125 MG tablet Take 1 tablet (3.125 mg total) by mouth 2 (two) times daily. 60 tablet 11  . cholecalciferol (VITAMIN D) 1000 UNITS tablet Take 1,000 Units by mouth daily.    . cyanocobalamin 1000 MCG tablet Take 100 mcg by mouth daily.    Marland Kitchen escitalopram (LEXAPRO) 10 MG tablet  Take 10 mg by mouth daily.    Marland Kitchen ipratropium (ATROVENT) 0.06 % nasal spray Place 1 spray into the nose daily.    . irbesartan (AVAPRO) 300 MG tablet Take 1 tablet (300 mg total) by mouth daily. 30 tablet 11  . latanoprost (XALATAN) 0.005 % ophthalmic solution Place 1 drop into both eyes at bedtime.    . nitroGLYCERIN (NITROSTAT) 0.4 MG SL tablet Place 1 tablet (0.4 mg total) under the tongue every 5 (five) minutes as needed for chest pain. 25 tablet 6  . simvastatin (ZOCOR) 20 MG tablet Take 10 mg by mouth daily.    . VOLTAREN 1 % GEL Apply topically at bedtime and may repeat dose one time if needed.      No current facility-administered medications for this visit.    No Known Allergies  Past Medical History  Diagnosis Date  . CAD, multiple vessel 2004    Myoview 2011: Mild Basal-mid Inferolateral "ischemia", EF ~76?% -- Anormal, but LOW RISK  . S/P CABG x 4 2004    SVG-LAD, SVG-D1, SVG-OM1, SVG-RPDA. (Native LIMA was 100% occluded)  . Essential hypertension   . Hyperlipidemia LDL goal <70     On statin.  . Diabetes mellitus type II, controlled ([place])     Diet controlled  .  Retinitis pigmentosa of both eyes      now essentially legally blind.  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 Mitral valve prolapse 02/07/2008; April 2015    a) Mild Anterior MVP, with mild MR.;; b) Echo 07/2013: EF 55-60%, Gr 1 DD, no WMA, Mild central MR with late systolic prolapse     Blood pressure 162/80, pulse 64, height 5\' 6"  (1.676 m), weight 153 lb (69.4 kg).    Tommy Medal PharmD CPP [facility]

## 2015-09-12 NOTE — Patient Instructions (Signed)
Return for a a follow up appointment in 2 months  Your blood pressure today is 162/80  (goal is < 150/90)  Check your blood pressure at home every other day (3-4 times per week) and keep record of the readings.  Take your BP meds as follows:  Increase amlodipine to 10 mg daily, continue with all your other medications  Bring all of your meds, your BP cuff and your record of home blood pressures to your next appointment.  Exercise as you're able, try to walk approximately 30 minutes per day.  Keep salt intake to a minimum, especially watch canned and prepared boxed foods.  Eat more fresh fruits and vegetables and fewer canned items.  Avoid eating in fast food restaurants.    HOW TO TAKE YOUR BLOOD PRESSURE: . Rest 5 minutes before taking your blood pressure. .  Don't smoke or drink caffeinated beverages for at least 30 minutes before. . Take your blood pressure before (not after) you eat. . Sit comfortably with your back supported and both feet on the floor (don't cross your legs). . Elevate your arm to heart level on a table or a desk. . Use the proper sized cuff. It should fit smoothly and snugly around your bare upper arm. There should be enough room to slip a fingertip under the cuff. The bottom edge of the cuff should be 1 inch above the crease of the elbow. . Ideally, take 3 measurements at one sitting and record the average.

## 2015-10-30 ENCOUNTER — Ambulatory Visit: Payer: Medicare Other

## 2015-10-30 DIAGNOSIS — R0989 Other specified symptoms and signs involving the circulatory and respiratory systems: Secondary | ICD-10-CM

## 2015-11-05 ENCOUNTER — Other Ambulatory Visit: Payer: Self-pay | Admitting: Cardiology

## 2015-11-16 ENCOUNTER — Other Ambulatory Visit: Payer: Self-pay | Admitting: Cardiology

## 2015-12-09 ENCOUNTER — Other Ambulatory Visit: Payer: Self-pay | Admitting: Internal Medicine

## 2015-12-09 DIAGNOSIS — Z1231 Encounter for screening mammogram for malignant neoplasm of breast: Secondary | ICD-10-CM

## 2015-12-11 ENCOUNTER — Other Ambulatory Visit: Payer: Self-pay | Admitting: Cardiology

## 2015-12-12 NOTE — Telephone Encounter (Signed)
REFILL 

## 2015-12-16 ENCOUNTER — Inpatient Hospital Stay: Admission: RE | Admit: 2015-12-16 | Payer: Medicare Other | Source: Ambulatory Visit

## 2015-12-17 ENCOUNTER — Ambulatory Visit
Admission: RE | Admit: 2015-12-17 | Discharge: 2015-12-17 | Disposition: A | Discharge status: Home / Self Care | Payer: Medicare Other | Source: Ambulatory Visit | Attending: Internal Medicine | Admitting: Internal Medicine

## 2015-12-17 ENCOUNTER — Other Ambulatory Visit: Payer: Self-pay | Admitting: Internal Medicine

## 2015-12-17 DIAGNOSIS — Z1231 Encounter for screening mammogram for malignant neoplasm of breast: Secondary | ICD-10-CM

## 2015-12-19 ENCOUNTER — Other Ambulatory Visit: Payer: Self-pay | Admitting: Internal Medicine

## 2015-12-19 DIAGNOSIS — R928 Other abnormal and inconclusive findings on diagnostic imaging of breast: Secondary | ICD-10-CM

## 2015-12-24 ENCOUNTER — Ambulatory Visit
Admission: RE | Admit: 2015-12-24 | Discharge: 2015-12-24 | Disposition: A | Discharge status: Home / Self Care | Payer: Medicare Other | Source: Ambulatory Visit | Attending: Internal Medicine | Admitting: Internal Medicine

## 2015-12-24 ENCOUNTER — Other Ambulatory Visit: Payer: Self-pay | Admitting: Internal Medicine

## 2015-12-24 DIAGNOSIS — R229 Localized swelling, mass and lump, unspecified: Principal | ICD-10-CM

## 2015-12-24 DIAGNOSIS — R928 Other abnormal and inconclusive findings on diagnostic imaging of breast: Secondary | ICD-10-CM

## 2015-12-24 DIAGNOSIS — IMO0002 Reserved for concepts with insufficient information to code with codable children: Secondary | ICD-10-CM

## 2015-12-25 ENCOUNTER — Other Ambulatory Visit: Payer: Self-pay | Admitting: Internal Medicine

## 2015-12-25 DIAGNOSIS — IMO0002 Reserved for concepts with insufficient information to code with codable children: Secondary | ICD-10-CM

## 2015-12-25 DIAGNOSIS — R229 Localized swelling, mass and lump, unspecified: Principal | ICD-10-CM

## 2015-12-26 ENCOUNTER — Other Ambulatory Visit: Payer: Self-pay | Admitting: Cardiology

## 2015-12-26 ENCOUNTER — Ambulatory Visit
Admission: RE | Admit: 2015-12-26 | Discharge: 2015-12-26 | Disposition: A | Discharge status: Home / Self Care | Payer: Medicare Other | Source: Ambulatory Visit | Attending: Internal Medicine | Admitting: Internal Medicine

## 2015-12-26 DIAGNOSIS — R229 Localized swelling, mass and lump, unspecified: Principal | ICD-10-CM

## 2015-12-26 DIAGNOSIS — IMO0002 Reserved for concepts with insufficient information to code with codable children: Secondary | ICD-10-CM

## 2015-12-27 ENCOUNTER — Encounter: Payer: Self-pay | Admitting: *Deleted

## 2015-12-27 ENCOUNTER — Telehealth: Payer: Self-pay | Admitting: *Deleted

## 2015-12-27 DIAGNOSIS — C50412 Malignant neoplasm of upper-outer quadrant of left female breast: Secondary | ICD-10-CM

## 2015-12-27 HISTORY — DX: Malignant neoplasm of upper-outer quadrant of left female breast: C50.412

## 2015-12-27 NOTE — Telephone Encounter (Signed)
Confirmed [facility] for 01/01/16 at 1215 .  Instructions and contact information given.

## 2016-01-01 ENCOUNTER — Ambulatory Visit (HOSPITAL_BASED_OUTPATIENT_CLINIC_OR_DEPARTMENT_OTHER): Payer: Medicare Other | Admitting: Hematology and Oncology

## 2016-01-01 ENCOUNTER — Other Ambulatory Visit (HOSPITAL_BASED_OUTPATIENT_CLINIC_OR_DEPARTMENT_OTHER): Payer: Medicare Other

## 2016-01-01 ENCOUNTER — Encounter: Payer: Self-pay | Admitting: Genetic Counselor

## 2016-01-01 ENCOUNTER — Ambulatory Visit: Payer: Medicare Other | Admitting: Physical Therapy

## 2016-01-01 ENCOUNTER — Ambulatory Visit
Admission: RE | Admit: 2016-01-01 | Discharge: 2016-01-01 | Disposition: A | Discharge status: Home / Self Care | Payer: Medicare Other | Source: Ambulatory Visit | Attending: Radiation Oncology | Admitting: Radiation Oncology

## 2016-01-01 DIAGNOSIS — C50412 Malignant neoplasm of upper-outer quadrant of left female breast: Secondary | ICD-10-CM | POA: Diagnosis not present

## 2016-01-01 DIAGNOSIS — Z170 Estrogen receptor positive status [ER+]: Secondary | ICD-10-CM | POA: Diagnosis not present

## 2016-01-01 LAB — CBC WITH DIFFERENTIAL/PLATELET
BASO%: 1.3 % (ref 0.0–2.0)
Basophils Absolute: 0.1 10*3/uL (ref 0.0–0.1)
EOS%: 0.6 % (ref 0.0–7.0)
Eosinophils Absolute: 0 10*3/uL (ref 0.0–0.5)
HEMATOCRIT: 40.4 % (ref 34.8–46.6)
HEMOGLOBIN: 13.3 g/dL (ref 11.6–15.9)
LYMPH#: 1.2 10*3/uL (ref 0.9–3.3)
LYMPH%: 29.9 % (ref 14.0–49.7)
MCH: 29.6 pg (ref 25.1–34.0)
MCHC: 32.9 g/dL (ref 31.5–36.0)
MCV: 89.9 fL (ref 79.5–101.0)
MONO#: 0.5 10*3/uL (ref 0.1–0.9)
MONO%: 11.5 % (ref 0.0–14.0)
NEUT%: 56.7 % (ref 38.4–76.8)
NEUTROS ABS: 2.4 10*3/uL (ref 1.5–6.5)
PLATELETS: 184 10*3/uL (ref 145–400)
RBC: 4.5 10*6/uL (ref 3.70–5.45)
RDW: 13.6 % (ref 11.2–14.5)
WBC: 4.2 10*3/uL (ref 3.9–10.3)

## 2016-01-01 LAB — COMPREHENSIVE METABOLIC PANEL
ALBUMIN: 3.6 g/dL (ref 3.5–5.0)
ALT: 9 U/L (ref 0–55)
ANION GAP: 11 meq/L (ref 3–11)
AST: 17 U/L (ref 5–34)
Alkaline Phosphatase: 84 U/L (ref 40–150)
BILIRUBIN TOTAL: 1.16 mg/dL (ref 0.20–1.20)
BUN: 12 mg/dL (ref 7.0–26.0)
CALCIUM: 9.4 mg/dL (ref 8.4–10.4)
CHLORIDE: 111 meq/L — AB (ref 98–109)
CO2: 20 mEq/L — ABNORMAL LOW (ref 22–29)
CREATININE: 0.9 mg/dL (ref 0.6–1.1)
EGFR: 72 mL/min/{1.73_m2} — ABNORMAL LOW (ref >90)
Glucose: 122 mg/dl (ref 70–140)
Potassium: 4 mEq/L (ref 3.5–5.1)
Sodium: 142 mEq/L (ref 136–145)
TOTAL PROTEIN: 7.6 g/dL (ref 6.4–8.3)

## 2016-01-01 NOTE — Assessment & Plan Note (Signed)
Left breast biopsy 12/26/2015: 1:00: Invasive ductal carcinoma grade 1, DCIS,ER 95%, PR 95%, Ki-67 10%, HER-2 negative ratio 1.12; left breast mass 1:00 position 7 mm size axilla ultrasound negative,  T1BN0 stage IA clinical stage  Pathology and radiology counseling: Discussed with the patient, the details of pathology including the type of breast cancer,the clinical staging, the significance of ER, PR and HER-2/neu receptors and the implications for treatment. After reviewing the pathology in detail, we proceeded to discuss the different treatment options between surgery, radiation, and antiestrogen therapies.  Recommendation: 1. Breast conserving surgery with sentinel lymph node biopsy 2. Genetic counseling because she has 2 sisters with breast cancer 3. +/- Radiation therapy 4. Antiestrogen therapy with anastrozole 1 mg daily 5 years  Return to clinic after surgery to discuss the final pathology report and come up with the final treatment plan.

## 2016-01-01 NOTE — Progress Notes (Signed)
Radiation Oncology         [phone] ________________________________  Initial Outpatient Consultation  Name: Olivia Gray MRN: [id number]  Date: 01/01/2016  DOB: 02-27-1933  LT:[id number],AQTMA, MD  [name], MD   REFERRING PHYSICIAN: [name], MD  DIAGNOSIS: The encounter diagnosis was Breast cancer of upper-outer quadrant of left female breast [facility]).   Clinical T1b grade 1 invasive ductal carcinoma of the left breast (ER/PR +, HER2 -)  HISTORY OF PRESENT ILLNESS::Olivia Gray is a 82 y.o. female who had a screening mammogram on 12/17/15 noting a possible asymmetry in the left breast.  Diagnostic mammogram of the left breast on 12/24/15 showed an irregular mass over the pectoralis muscle. On physical exam, a subtle firm mass was palpated in the 1:00 position of the left breast 8-10 cm from the nipple. Ultrasound at the time showed a 0.7 x 0.7 x 0.7 cm mass with microlobulated and indistinct margins in the 1:00 position 8-10 cm from the nipple in the left breast. Ultrasound of the left axilla was negative for lymphadenopathy.  Biopsy of the left breast on 12/26/15 revealed grade 1 invasive ductal carcinoma with DCIS (ER 95% positive, PR 95% positive, HER2 negative, Ki67 10%).  The patient presents today in multidisciplinary breast clinic to discuss treatment options for the management of her disease.  PREVIOUS RADIATION THERAPY: No  PAST MEDICAL HISTORY:  has a past medical history of Breast cancer of upper-outer quadrant of left female breast ([place]) (12/27/2015); CAD, multiple vessel (2004); Diabetes mellitus type II, controlled ([place]); Essential hypertension; Hyperlipidemia LDL goal <70; Mitral valve prolapse (02/07/2008; April 2015); Retinitis pigmentosa of both eyes; and S/P CABG x 4 (2004).  Legally blind  PAST SURGICAL HISTORY: Past Surgical History:  Procedure Laterality Date  . CARDIAC CATHETERIZATION  05/22/2009   all grafts patent ,LV systolic fx normal,small  -vessel diabetic disease in native vessels   . CHOLECYSTECTOMY  02/05/2005  . CORONARY ARTERY BYPASS GRAFT  07/21/2002   in [place] ; SVG-LAD, SVG-D1, SVG-OM1, SVG RPDA.  . DOPPLER ECHOCARDIOGRAPHY  02/07/2008; April 2015   EF=>55%; LV normal; mild anterior MVP with mild MR.; b) 07/2013: EF 55-60%. Normal wall motion. Gr 1 DD, mild MR with late systolic prolapse. Normal central venous and pulmonary arterial pressures   . NM MYOCAR PERF WALL MOTION  04/2009   EF 76%; mild ischeia basal inferolateral,mid inferoinlateral regions(s), abn pharmacologic nuc test, deffect indicte new ischemia,LV syst.fx normal --> cath showing no obstructive targets.  Marland Kitchen NM MYOVIEW LTD  June 2016   Normal perfusion. Normal study. LOW RISK. Normal EF (55%). Compared to prior study, no ischemic changes present.    FAMILY HISTORY: 2 sisters with a history of breast cancer  SOCIAL HISTORY:  reports that she has never smoked. She has never used smokeless tobacco. She reports that she does not drink alcohol or use drugs. Jehovah's Witness, does not accept blood transfusions  ALLERGIES: Review of patient's allergies indicates no known allergies.  MEDICATIONS:  Current Outpatient Prescriptions  Medication Sig Dispense Refill  . amLODipine (NORVASC) 10 MG tablet Take 1 tablet (10 mg total) by mouth daily. 30 tablet 6  . aspirin 81 MG tablet Take 81 mg by mouth daily.    . carvedilol (COREG) 3.125 MG tablet TAKE 1 TABLET BY MOUTH TWICE A DAY 60 tablet 11  . cholecalciferol (VITAMIN D) 1000 UNITS tablet Take 1,000 Units by mouth daily.    . cyanocobalamin 1000 MCG tablet Take 100 mcg by mouth daily.    Marland Kitchen  escitalopram (LEXAPRO) 10 MG tablet Take 10 mg by mouth daily.    Marland Kitchen ipratropium (ATROVENT) 0.06 % nasal spray Place 1 spray into the nose daily.    . irbesartan (AVAPRO) 300 MG tablet TAKE 1 TABLET EVERY DAY 30 tablet 0  . latanoprost (XALATAN) 0.005 % ophthalmic solution Place 1 drop into both eyes at bedtime.    .  nitroGLYCERIN (NITROSTAT) 0.4 MG SL tablet Place 1 tablet (0.4 mg total) under the tongue every 5 (five) minutes as needed for chest pain. 25 tablet 6  . simvastatin (ZOCOR) 20 MG tablet Take 10 mg by mouth daily.    . VOLTAREN 1 % GEL Apply topically at bedtime and may repeat dose one time if needed.      No current facility-administered medications for this encounter.     REVIEW OF SYSTEMS:  A 15 point review of systems is documented in the electronic medical record. This was obtained by the nursing staff. However, I reviewed this with the patient to discuss relevant findings and make appropriate changes.  Pertinent items noted in HPI and remainder of comprehensive ROS otherwise negative.    PHYSICAL EXAM:  vitals were not taken for this visit.  Vitals with BMI 01/01/2016  Height '5\' 6"'   Weight 151 lbs 10 oz  BMI 51.0  Systolic 258  Diastolic 72  Pulse 63  Respirations 18   Lungs are clear to auscultation bilaterally. Heart has regular rate and rhythm. No palpable cervical, supraclavicular, or axillary adenopathy. Abdomen soft, non-tender, normal bowel sounds.  Long vertical in her sternum from her CABG. Left breast has a band-aid in the UOQ from her recent biopsy. No palpable mass within the left breast, no nipple discharge or bleeding. Right breast, no nipple discharge/bleeding or mass.  ECOG = 0  LABORATORY DATA:  Lab Results  Component Value Date   WBC 4.2 01/01/2016   HGB 13.3 01/01/2016   HCT 40.4 01/01/2016   MCV 89.9 01/01/2016   PLT 184 01/01/2016   NEUTROABS 2.4 01/01/2016   Lab Results  Component Value Date   NA 142 01/01/2016   K 4.0 01/01/2016   CL 110 07/31/2011   CO2 20 (L) 01/01/2016   GLUCOSE 122 01/01/2016   CREATININE 0.9 01/01/2016   CALCIUM 9.4 01/01/2016      RADIOGRAPHY: Mm Digital Diagnostic Unilat L  Result Date: 12/26/2015 CLINICAL DATA:  Status post ultrasound-guided core needle biopsy of a 7 mm mass in the 1 o'clock position of the left  breast. EXAM: DIAGNOSTIC LEFT MAMMOGRAM POST ULTRASOUND BIOPSY COMPARISON:  Previous exam(s). FINDINGS: Mammographic images were obtained following ultrasound guided biopsy of the recently demonstrated 7 mm mass in the 1 o'clock position of the left breast. A coil shaped biopsy marker clip is overlying the mass on the true lateral view. This could not be included in the craniocaudal projection. IMPRESSION: Appropriate clip deployment in the true lateral projection. Since this cannot be included in the craniocaudal projection due to its far posterior location, needle localization, if necessary, would need to be performed with ultrasound guidance. Final Assessment: Post Procedure Mammograms for Marker Placement Electronically Signed   By: Claudie Revering M.D.   On: 12/26/2015 09:32   US Breast Ltd Uni Left Inc Axilla  Result Date: 12/24/2015 CLINICAL DATA:  Possible asymmetry left breast identified superiorly on the MLO view of the recent screening mammogram. The patient's husband is present with her today. EXAM: DIGITAL DIAGNOSTIC LEFT MAMMOGRAM WITH CAD ULTRASOUND LEFT BREAST COMPARISON:  12/17/2015 and earlier priors ACR Breast Density Category b: There are scattered areas of fibroglandular density. FINDINGS: Focal spot compression view of the superior left breast, including tomography confirms an irregular mass projecting over the pectoralis muscle. This mass is also seen on the 90 degree lateral view. Mammographic images were processed with CAD. On physical exam, I do palpate a subtle firm mass in the 1 o'clock position of the left breast 8 to 10 cm from the nipple. Targeted ultrasound is performed, showing a rounded mass with microlobulated and indistinct margins at 1 o'clock position 8-10 cm from nipple that measures 7 x 7 x 7 mm. The mass is heterogeneously hypoechoic. No definite internal vascular flow. Ultrasound of the left axilla is negative for lymphadenopathy. IMPRESSION: 7 mm mass 1 o'clock position  left breast. The imaging appearances are suspicious for malignancy. RECOMMENDATION: Ultrasound-guided biopsy is recommended, and this was discussed with the patient and her husband today. The patient's husband requested that I contact Dr. Doristine Devoid office by telephone to advise them of the scheduled biopsy. I spoke with Levada Dy by telephone on 12/24/2015 after speaking with the patient; Dr. Doristine Devoid office is aware that the patient is scheduled for an ultrasound-guided left breast biopsy at 7:30 a.m. on Thursday December 26, 2015. I have discussed the findings and recommendations with the patient. Results were also provided in writing at the conclusion of the visit. If applicable, a reminder letter will be sent to the patient regarding the next appointment. BI-RADS CATEGORY  4: Suspicious. Electronically Signed   By: Curlene Dolphin M.D.   On: 12/24/2015 16:52   Mm Diag Breast Tomo Uni Left  Result Date: 12/24/2015 CLINICAL DATA:  Possible asymmetry left breast identified superiorly on the MLO view of the recent screening mammogram. The patient's husband is present with her today. EXAM: DIGITAL DIAGNOSTIC LEFT MAMMOGRAM WITH CAD ULTRASOUND LEFT BREAST COMPARISON:  12/17/2015 and earlier priors ACR Breast Density Category b: There are scattered areas of fibroglandular density. FINDINGS: Focal spot compression view of the superior left breast, including tomography confirms an irregular mass projecting over the pectoralis muscle. This mass is also seen on the 90 degree lateral view. Mammographic images were processed with CAD. On physical exam, I do palpate a subtle firm mass in the 1 o'clock position of the left breast 8 to 10 cm from the nipple. Targeted ultrasound is performed, showing a rounded mass with microlobulated and indistinct margins at 1 o'clock position 8-10 cm from nipple that measures 7 x 7 x 7 mm. The mass is heterogeneously hypoechoic. No definite internal vascular flow. Ultrasound of the left  axilla is negative for lymphadenopathy. IMPRESSION: 7 mm mass 1 o'clock position left breast. The imaging appearances are suspicious for malignancy. RECOMMENDATION: Ultrasound-guided biopsy is recommended, and this was discussed with the patient and her husband today. The patient's husband requested that I contact Dr. Doristine Devoid office by telephone to advise them of the scheduled biopsy. I spoke with Levada Dy by telephone on 12/24/2015 after speaking with the patient; Dr. Doristine Devoid office is aware that the patient is scheduled for an ultrasound-guided left breast biopsy at 7:30 a.m. on Thursday December 26, 2015. I have discussed the findings and recommendations with the patient. Results were also provided in writing at the conclusion of the visit. If applicable, a reminder letter will be sent to the patient regarding the next appointment. BI-RADS CATEGORY  4: Suspicious. Electronically Signed   By: Curlene Dolphin M.D.   On: 12/24/2015 16:52   Mm Screening  Breast Tomo Bilateral  Result Date: 12/18/2015 CLINICAL DATA:  Screening. Prior benign excisional biopsies of both breasts in 2006. EXAM: 2D DIGITAL SCREENING BILATERAL MAMMOGRAM WITH CAD AND ADJUNCT TOMO COMPARISON:  Previous exam(s). ACR Breast Density Category b: There are scattered areas of fibroglandular density. FINDINGS: In the left breast, a possible asymmetry warrants further evaluation. In the right breast, no findings suspicious for malignancy. Images were processed with CAD. IMPRESSION: Further evaluation is suggested for possible asymmetry in the left breast. RECOMMENDATION: 3D diagnostic mammogram and possibly ultrasound of the left breast. (Code:[id number]) The patient will be contacted regarding the findings, and additional imaging will be scheduled. BI-RADS CATEGORY  0: Incomplete. Need additional imaging evaluation and/or prior mammograms for comparison. Electronically Signed   By: Evangeline Dakin M.D.   On: 12/18/2015 11:33   [place] Lt Breast Bx  W Loc Dev 1st Lesion Img Bx Spec US Guide  Result Date: 12/26/2015 CLINICAL DATA:  7 mm mass in the 1 o'clock position of the left breast at recent mammography and ultrasound. EXAM: ULTRASOUND GUIDED LEFT BREAST CORE NEEDLE BIOPSY COMPARISON:  Previous exam(s). FINDINGS: I met with the patient and we discussed the procedure of ultrasound-guided biopsy, including benefits and alternatives. We discussed the high likelihood of a successful procedure. We discussed the risks of the procedure, including infection, bleeding, tissue injury, clip migration, and inadequate sampling. Informed written consent was given. The usual time-out protocol was performed immediately prior to the procedure. Using sterile technique and 1% Lidocaine as local anesthetic, under direct ultrasound visualization, a 12 gauge spring-loaded device was used to perform biopsy of the recently demonstrated 7 mm mass in the 1 o'clock position of the left breast using a caudal approach. At the conclusion of the procedure a coil shaped tissue marker clip was deployed into the biopsy cavity. Follow up 2 view mammogram was performed and dictated separately. IMPRESSION: Ultrasound guided biopsy of a 7 mm mass in the 1 o'clock position of the left breast. No apparent complications. Electronically Signed   By: Claudie Revering M.D.   On: 12/26/2015 09:29      IMPRESSION: Clinical T1b grade 1 invasive ductal carcinoma of the left breast (ER/PR +, HER2 -)  I spoke to the patient today regarding her diagnosis and options for treatment. We discussed the equivalence in terms of survival and local failure between mastectomy and breast conservation. We discussed the role of radiation in decreasing local failures in patients who undergo lumpectomy. We discussed the process of simulation and the placement tattoos. We discussed 4-6 weeks of treatment as an outpatient. We discussed the possibility of asymptomatic lung damage. We discussed the low likelihood of  secondary malignancies. We discussed the possible side effects including but not limited to skin redness, fatigue, permanent skin darkening, and breast swelling.  We discussed the use of cardiac sparing with deep inspiration breath hold if needed.  The patient has good prognosis at this point and will proceed with definitive breast surgery. Given her excellent prognosis and age, would would likely not require adjuvant radiation and hormone therapy. However, she will make that decision after surgery.  PLAN: The patient may have genetics, but will likely not affect her management. She will proceed with a left lumpectomy. The patient will be seen in a post-operative setting to discuss her treatment options.   ------------------------------------------------  Blair Promise, PhD, MD  This document serves as a record of services personally performed by Gery Pray, MD. It was created on his behalf by  Darcus Austin, a trained medical scribe. The creation of this record is based on the scribe's personal observations and the provider's statements to them. This document has been checked and approved by the attending provider.

## 2016-01-01 NOTE — Progress Notes (Signed)
[place] NOTE  Patient Care Team: [name], MD as PCP - General (Internal Medicine) [name], MD as Consulting Physician (General Surgery) Nicholas Lose, MD as Consulting Physician (Hematology and Oncology) Gery Pray, MD as Consulting Physician (Radiation Oncology)  CHIEF COMPLAINTS/PURPOSE OF CONSULTATION:  Newly diagnosed breast cancer  HISTORY OF PRESENTING ILLNESS:  Olivia Gray 82 y.o. female is here because of recent diagnosis of left breast cancer. Patient had a previous benign breast biopsies in 2006. She had a routine screening mammogram that detected an abnormality in the left breast which measured 7 mm by ultrasound. She underwent ultrasound guided biopsy which came back as invasive ductal carcinoma grade 1 that was ER/PR positive HER-2 negative with a Ki-67 of 10%. She was presented this morning in the multidisciplinary tumor board and she is here today to discuss treatment plan. Patient is legally blind. She has had previous heart disease and had a triple bypass surgery. She is accompanied by her husband who is her primary caregiver.  I reviewed her records extensively and collaborated the history with the patient.  SUMMARY OF ONCOLOGIC HISTORY:   Breast cancer of upper-outer quadrant of left female breast ([place])   12/26/2015 Initial Diagnosis    Left breast biopsy 1:00: Invasive ductal carcinoma grade 1, DCIS,ER 95%, PR 95%, Ki-67 10%, HER-2 negative ratio 1.12; left breast mass 1:00 position 7 mm size axilla ultrasound negative, T1 BN 0 stage IA clinical stage       MEDICAL HISTORY:  Past Medical History:  Diagnosis Date  . Breast cancer of upper-outer quadrant of left female breast ([place]) 12/27/2015  . CAD, multiple vessel 2004   Myoview 2011: Mild Basal-mid Inferolateral "ischemia", EF ~76?% -- Anormal, but LOW RISK  . Diabetes mellitus type II, controlled ([place])    Diet controlled  . Essential hypertension   . Hyperlipidemia LDL  goal <70    On statin.  . Mitral valve prolapse 02/07/2008; April 2015   a) Mild Anterior MVP, with mild MR.;; b) Echo 07/2013: EF 55-60%, Gr 1 DD, no WMA, Mild central MR with late systolic prolapse   . Retinitis pigmentosa of both eyes     now essentially legally blind.  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 S/P CABG x 4 2004   SVG-LAD, SVG-D1, SVG-OM1, SVG-RPDA. (Native LIMA was 100% occluded)    SURGICAL HISTORY: Past Surgical History:  Procedure Laterality Date  . CARDIAC CATHETERIZATION  05/22/2009   all grafts patent ,LV systolic fx normal,small -vessel diabetic disease in native vessels   . CHOLECYSTECTOMY  02/05/2005  . CORONARY ARTERY BYPASS GRAFT  07/21/2002   in [place] ; SVG-LAD, SVG-D1, SVG-OM1, SVG RPDA.  . DOPPLER ECHOCARDIOGRAPHY  02/07/2008; April 2015   EF=>55%; LV normal; mild anterior MVP with mild MR.; b) 07/2013: EF 55-60%. Normal wall motion. Gr 1 DD, mild MR with late systolic prolapse. Normal central venous and pulmonary arterial pressures   . NM MYOCAR PERF WALL MOTION  04/2009   EF 76%; mild ischeia basal inferolateral,mid inferoinlateral regions(s), abn pharmacologic nuc test, deffect indicte new ischemia,LV syst.fx normal --> cath showing no obstructive targets.  Marland Kitchen NM MYOVIEW LTD  June 2016   Normal perfusion. Normal study. LOW RISK. Normal EF (55%). Compared to prior study, no ischemic changes present.    SOCIAL HISTORY: Social History   Social History  . Marital  status: Married    Spouse name: N/A  . Number of children: N/A  . Years of education: N/A   Occupational History  . Not on file.   Social History Main Topics  . Smoking status: Never Smoker  . Smokeless tobacco: Not on file  . Alcohol use No  . Drug use: No  . Sexual activity: Yes    Birth control/ protection: Post-menopausal   Other Topics Concern  . Not on file    Social History Narrative   She is married with 2 children. Does not smoke or drink alcohol. She does routine exercise roughly 3-4 days a week where she follows a   The video. Weights stomach exercises jumping jacks and other aerobic type exercises.    FAMILY HISTORY: No family history on file.  ALLERGIES:  has No Known Allergies.  MEDICATIONS:  Current Outpatient Prescriptions  Medication Sig Dispense Refill  . amLODipine (NORVASC) 10 MG tablet Take 1 tablet (10 mg total) by mouth daily. 30 tablet 6  . aspirin 81 MG tablet Take 81 mg by mouth daily.    . carvedilol (COREG) 3.125 MG tablet TAKE 1 TABLET BY MOUTH TWICE A DAY 60 tablet 11  . cholecalciferol (VITAMIN D) 1000 UNITS tablet Take 1,000 Units by mouth daily.    . cyanocobalamin 1000 MCG tablet Take 100 mcg by mouth daily.    Marland Kitchen escitalopram (LEXAPRO) 10 MG tablet Take 10 mg by mouth daily.    Marland Kitchen ipratropium (ATROVENT) 0.06 % nasal spray Place 1 spray into the nose daily.    . irbesartan (AVAPRO) 300 MG tablet TAKE 1 TABLET EVERY DAY 30 tablet 0  . latanoprost (XALATAN) 0.005 % ophthalmic solution Place 1 drop into both eyes at bedtime.    . nitroGLYCERIN (NITROSTAT) 0.4 MG SL tablet Place 1 tablet (0.4 mg total) under the tongue every 5 (five) minutes as needed for chest pain. 25 tablet 6  . simvastatin (ZOCOR) 20 MG tablet Take 10 mg by mouth daily.    . VOLTAREN 1 % GEL Apply topically at bedtime and may repeat dose one time if needed.      No current facility-administered medications for this visit.     REVIEW OF SYSTEMS:   Constitutional: Denies fevers, chills or abnormal night sweats Eyes: blindness Ears, nose, mouth, throat, and face: Denies mucositis or sore throat Respiratory: Denies cough, dyspnea or wheezes Cardiovascular: Denies palpitation, chest discomfort or lower extremity swelling Gastrointestinal:  Denies nausea, heartburn or change in bowel habits Skin: Denies abnormal skin rashes Lymphatics: Denies  new lymphadenopathy or easy bruising Neurological:Denies numbness, tingling or new weaknesses Behavioral/Psych: Mood is stable, no new changes  Breast:  Denies any palpable lumps or discharge All other systems were reviewed with the patient and are negative.  PHYSICAL EXAMINATION: ECOG PERFORMANCE STATUS: 1 - Symptomatic but completely ambulatory  Vitals:   01/01/16 1305  BP: (!) 155/72  Pulse: 63  Resp: 18  Temp: 98.5 F (36.9 C)   Filed Weights   01/01/16 1305  Weight: 151 lb 9.6 oz (68.8 kg)    GENERAL:alert, no distress and comfortable SKIN: skin color, texture, turgor are normal, no rashes or significant lesions EYES: normal, conjunctiva are pink and non-injected, sclera clear, blindness OROPHARYNX:no exudate, no erythema and lips, buccal mucosa, and tongue normal  NECK: supple, thyroid normal size, non-tender, without nodularity LYMPH:  no palpable lymphadenopathy in the cervical, axillary or inguinal LUNGS: clear to auscultation and percussion with normal breathing effort HEART: regular rate &  rhythm and no murmurs and no lower extremity edema ABDOMEN:abdomen soft, non-tender and normal bowel sounds Musculoskeletal:no cyanosis of digits and no clubbing  PSYCH: alert & oriented x 3 with fluent speech NEURO: no focal motor/sensory deficits BREAST: No palpable nodules in breast. No palpable axillary or supraclavicular lymphadenopathy (exam performed in the presence of a chaperone)   LABORATORY DATA:  I have reviewed the data as listed Lab Results  Component Value Date   WBC 4.2 01/01/2016   HGB 13.3 01/01/2016   HCT 40.4 01/01/2016   MCV 89.9 01/01/2016   PLT 184 01/01/2016   Lab Results  Component Value Date   NA 142 01/01/2016   K 4.0 01/01/2016   CL 110 07/31/2011   CO2 20 (L) 01/01/2016    RADIOGRAPHIC STUDIES: I have personally reviewed the radiological reports and agreed with the findings in the report.  ASSESSMENT AND PLAN:  Breast cancer of  upper-outer quadrant of left female breast ([place]) Left breast biopsy 12/26/2015: 1:00: Invasive ductal carcinoma grade 1, DCIS,ER 95%, PR 95%, Ki-67 10%, HER-2 negative ratio 1.12; left breast mass 1:00 position 7 mm size axilla ultrasound negative,  T1BN0 stage IA clinical stage  Pathology and radiology counseling: Discussed with the patient, the details of pathology including the type of breast cancer,the clinical staging, the significance of ER, PR and HER-2/neu receptors and the implications for treatment. After reviewing the pathology in detail, we proceeded to discuss the different treatment options between surgery, radiation, and antiestrogen therapies.  Recommendation: 1. Breast conserving surgery with sentinel lymph node biopsy 2. Genetic counseling because she has 2 sisters with breast cancer 3. +/- Radiation therapy 4. Antiestrogen therapy with anastrozole 1 mg daily 5 years  Patient may choose between radiation and antiestrogen therapy.We'll make a final decision after receiving the final pathology report. Return to clinic after surgery to discuss the final pathology report and come up with the final treatment plan.  All questions were answered. The patient knows to call the clinic with any problems, questions or concerns.    Rulon Eisenmenger, MD 01/01/16

## 2016-01-02 ENCOUNTER — Telehealth: Payer: Self-pay | Admitting: Cardiology

## 2016-01-02 ENCOUNTER — Encounter: Payer: Self-pay | Admitting: General Practice

## 2016-01-02 NOTE — Telephone Encounter (Signed)
New Message  Pt husband call requesting to speak with RN. Pt husbands states pt received an automated call today reminding pt of her appt, but states pt does not have an appt. Pt husband states this is concerning him because pt is having a lot of health issues. And would like to speak to RN about the inconvenience of the call. Please call back to discuss

## 2016-01-02 NOTE — Progress Notes (Signed)
[place] Psychosocial Distress Screening Spiritual Care  Shadowed by counseling intern Rosana Fret, met with [facility] and her husband in [facility] to introduce [place] team/resources, reviewing distress screen per protocol.  The patient scored a 10 on the Psychosocial Distress Thermometer which indicates severe distress. Also assessed for distress and other psychosocial needs.   ONCBCN DISTRESS SCREENING 01/02/2016  Screening Type Initial Screening  Distress experienced in past week (1-10) 10  Physical Problem type (No Data)  Referral to support programs Yes   Per pt, she was very distressed about dx prior to [facility].  Couple found the very welcoming team support and clinical information reassuring and relieving.  Per couple, they feel very cared for and supported.  They are Jehovah's Witnesses and thus do not want blood products as part of tx.  For Ms Alred, an additional stressor is facing this dx/tx on top of the loss of her vision.  Their biggest question for chaplain and counseling intern was whether or not they should tell others about dx/tx.  Couple came to clinic with differing perspectives.  Together we explored common reasons why patients and families choose to share (and not to share) health updates.  Normalized desire for privacy and desire for connection.  Included suggestions for how to manage selective sharing of information and how to communicate personal needs and preferences to others.  Couple found this discussion very helpful and relieving.  Follow up needed: No. Couple reports no other needs at this time and is aware of ongoing [place] team/programming availability, but please also page if needs arise/circumstances change.  Thank you.   [place], [place], [facility] Pager [phone] Voicemail [phone]

## 2016-01-02 NOTE — Telephone Encounter (Signed)
Discussed with Michele Rockers and advised patient and husband that I was not sure who called Apologized for the inconvenience

## 2016-01-03 ENCOUNTER — Telehealth: Payer: Self-pay | Admitting: Cardiovascular Disease

## 2016-01-03 NOTE — Telephone Encounter (Signed)
Surgical Clearance routed to MD Claiborne Billings.

## 2016-01-03 NOTE — Telephone Encounter (Signed)
New message   Request for surgical clearance:  1. What type of surgery is being performed? Breast surgery - left    2. When is this surgery scheduled? Pending   3. Are there any medications that need to be held prior to surgery and how long? Per MD    4. Name of physician performing surgery? Dr. Donne Hazel   5. What is your office phone and fax number? [phone] Joylene Igo [phone]

## 2016-01-07 ENCOUNTER — Telehealth: Payer: Self-pay | Admitting: *Deleted

## 2016-01-07 NOTE — Telephone Encounter (Signed)
Called to discuss [name] from 01/01/16. Denies questions or concerns regarding dx or treatment care plan. Encourage pt to call with needs. Received verbal understanding. Contact information provided.

## 2016-01-07 NOTE — Telephone Encounter (Signed)
Follow up        Calling to get update on clearance for breast surgery.  Will Dr Claiborne Billings clear her?

## 2016-01-07 NOTE — Telephone Encounter (Signed)
Clearance routed to MD Harding-primary cardiologist. Olivia Gray with [facility] at [facility] and verbalized understanding.

## 2016-01-09 ENCOUNTER — Other Ambulatory Visit: Payer: Self-pay | Admitting: General Surgery

## 2016-01-09 ENCOUNTER — Other Ambulatory Visit: Payer: Self-pay | Admitting: Cardiology

## 2016-01-09 DIAGNOSIS — C50912 Malignant neoplasm of unspecified site of left female breast: Secondary | ICD-10-CM

## 2016-01-09 NOTE — Telephone Encounter (Signed)
Preoperative Cardiac Evaluation Note I last saw the patient in April 2017: She was doing well without any major symptoms. Last Myoview was in June 2016 is nonischemic.  PREOPERATIVE CARDIAC RISK ASSESSMENT   Revised Cardiac Risk Index:  High Risk Surgery: no; breast cancer surgery  Defined as Intraperitoneal, intrathoracic or suprainguinal vascular  Active CAD: no; no active angina  CHF: no; has not had CHF symptoms  Cerebrovascular Disease: no;   Diabetes: yes; On Insulin: no  CKD (Cr >~ 2): no;   Total: 0 Estimated Risk of Adverse Outcome: LOW  Estimated Risk of MI, PE, VF/VT (Cardiac Arrest), Complete Heart Block: <1%   ACC/AHA Guidelines for "Clearance":  Step 1 - Need for Emergency Surgery: No:   If Yes - go straight to OR with perioperative surveillance  Step 2 - Active Cardiac Conditions (Unstable Angina, Decompensated HF, Significant  Arrhytmias - Complete HB, Mobitz II, Symptomatic VT or SVT, Severe Aortic Stenosis - mean gradient > 40 mmHg, Valve area < 1.0 cm2):   No:   If Yes - Evaluate & Treat per ACC/AHA Guidelines  Step 3 -  Low Risk Surgery: Yes  If Yes --> proceed to OR  If No --> Step 4  Step 4 - Functional Capacity >= 4 METS without symptoms: Yes  If Yes --> proceed to OR  If No --> Step 5   She should be fine to proceed with her surgery without any further cardiac evaluation. She is low risk despite her history.   Glenetta Hew, MD Glenetta Hew, M.D., M.S. Interventional Cardiologist   Pager # [phone] Phone # [phone] [address] [place], [place] [address]

## 2016-01-09 NOTE — Telephone Encounter (Signed)
Clearance faxed via Epic to # provided.  

## 2016-01-13 ENCOUNTER — Other Ambulatory Visit: Payer: Self-pay | Admitting: General Surgery

## 2016-01-13 DIAGNOSIS — C50912 Malignant neoplasm of unspecified site of left female breast: Secondary | ICD-10-CM

## 2016-01-22 ENCOUNTER — Encounter (HOSPITAL_BASED_OUTPATIENT_CLINIC_OR_DEPARTMENT_OTHER): Payer: Self-pay | Admitting: *Deleted

## 2016-01-28 ENCOUNTER — Ambulatory Visit
Admission: RE | Admit: 2016-01-28 | Discharge: 2016-01-28 | Disposition: A | Discharge status: Home / Self Care | Payer: Medicare Other | Source: Ambulatory Visit | Attending: General Surgery | Admitting: General Surgery

## 2016-01-28 ENCOUNTER — Other Ambulatory Visit: Payer: Self-pay | Admitting: General Surgery

## 2016-01-28 DIAGNOSIS — C50912 Malignant neoplasm of unspecified site of left female breast: Secondary | ICD-10-CM

## 2016-01-28 NOTE — Progress Notes (Signed)
Pt and husband instructed To drink Boost drink by 6:45 am on day of surgery with teach back method.

## 2016-01-29 ENCOUNTER — Encounter (HOSPITAL_BASED_OUTPATIENT_CLINIC_OR_DEPARTMENT_OTHER)
Admission: RE | Disposition: A | Discharge status: Home / Self Care | Payer: Self-pay | Source: Ambulatory Visit | Attending: General Surgery

## 2016-01-29 ENCOUNTER — Ambulatory Visit (HOSPITAL_BASED_OUTPATIENT_CLINIC_OR_DEPARTMENT_OTHER)
Admission: RE | Admit: 2016-01-29 | Discharge: 2016-01-29 | Disposition: A | Discharge status: Home / Self Care | Payer: Medicare Other | Source: Ambulatory Visit | Attending: General Surgery | Admitting: General Surgery

## 2016-01-29 ENCOUNTER — Encounter (HOSPITAL_BASED_OUTPATIENT_CLINIC_OR_DEPARTMENT_OTHER): Payer: Self-pay | Admitting: Certified Registered"

## 2016-01-29 ENCOUNTER — Ambulatory Visit (HOSPITAL_BASED_OUTPATIENT_CLINIC_OR_DEPARTMENT_OTHER): Payer: Medicare Other | Admitting: Certified Registered"

## 2016-01-29 ENCOUNTER — Ambulatory Visit
Admission: RE | Admit: 2016-01-29 | Discharge: 2016-01-29 | Disposition: A | Discharge status: Home / Self Care | Payer: Medicare Other | Source: Ambulatory Visit | Attending: General Surgery | Admitting: General Surgery

## 2016-01-29 DIAGNOSIS — C50912 Malignant neoplasm of unspecified site of left female breast: Secondary | ICD-10-CM | POA: Diagnosis present

## 2016-01-29 DIAGNOSIS — I110 Hypertensive heart disease with heart failure: Secondary | ICD-10-CM | POA: Diagnosis not present

## 2016-01-29 DIAGNOSIS — Z803 Family history of malignant neoplasm of breast: Secondary | ICD-10-CM | POA: Insufficient documentation

## 2016-01-29 DIAGNOSIS — Z79899 Other long term (current) drug therapy: Secondary | ICD-10-CM | POA: Diagnosis not present

## 2016-01-29 DIAGNOSIS — C773 Secondary and unspecified malignant neoplasm of axilla and upper limb lymph nodes: Secondary | ICD-10-CM | POA: Insufficient documentation

## 2016-01-29 DIAGNOSIS — E7800 Pure hypercholesterolemia, unspecified: Secondary | ICD-10-CM | POA: Insufficient documentation

## 2016-01-29 DIAGNOSIS — F329 Major depressive disorder, single episode, unspecified: Secondary | ICD-10-CM | POA: Insufficient documentation

## 2016-01-29 DIAGNOSIS — I509 Heart failure, unspecified: Secondary | ICD-10-CM | POA: Insufficient documentation

## 2016-01-29 DIAGNOSIS — Z170 Estrogen receptor positive status [ER+]: Secondary | ICD-10-CM | POA: Insufficient documentation

## 2016-01-29 DIAGNOSIS — I2510 Atherosclerotic heart disease of native coronary artery without angina pectoris: Secondary | ICD-10-CM | POA: Insufficient documentation

## 2016-01-29 DIAGNOSIS — Z7982 Long term (current) use of aspirin: Secondary | ICD-10-CM | POA: Insufficient documentation

## 2016-01-29 DIAGNOSIS — C50412 Malignant neoplasm of upper-outer quadrant of left female breast: Secondary | ICD-10-CM | POA: Insufficient documentation

## 2016-01-29 DIAGNOSIS — C50919 Malignant neoplasm of unspecified site of unspecified female breast: Secondary | ICD-10-CM

## 2016-01-29 DIAGNOSIS — Z951 Presence of aortocoronary bypass graft: Secondary | ICD-10-CM | POA: Diagnosis not present

## 2016-01-29 HISTORY — DX: Prediabetes: R73.03

## 2016-01-29 HISTORY — PX: BREAST LUMPECTOMY WITH RADIOACTIVE SEED LOCALIZATION: SHX6424

## 2016-01-29 HISTORY — DX: Malignant neoplasm of unspecified site of unspecified female breast: C50.919

## 2016-01-29 HISTORY — PX: BREAST LUMPECTOMY: SHX2

## 2016-01-29 SURGERY — BREAST LUMPECTOMY WITH RADIOACTIVE SEED LOCALIZATION
Anesthesia: General | Site: Breast | Laterality: Left

## 2016-01-29 MED ORDER — ACETAMINOPHEN 500 MG PO TABS
1000.0000 mg | ORAL_TABLET | ORAL | Status: AC
  Administered 2016-01-29: 1000 mg via ORAL

## 2016-01-29 MED ORDER — SUCCINYLCHOLINE CHLORIDE 200 MG/10ML IV SOSY
PREFILLED_SYRINGE | INTRAVENOUS | Status: AC
  Filled 2016-01-29: qty 10

## 2016-01-29 MED ORDER — MIDAZOLAM HCL 2 MG/2ML IJ SOLN: 1.0000 mg | INTRAMUSCULAR | Status: DC | PRN

## 2016-01-29 MED ORDER — ATROPINE SULFATE 0.4 MG/ML IJ SOLN
INTRAMUSCULAR | Status: DC | PRN
  Administered 2016-01-29: 0.4 mg via INTRAVENOUS

## 2016-01-29 MED ORDER — CHLORHEXIDINE GLUCONATE CLOTH 2 % EX PADS
6.0000 | MEDICATED_PAD | Freq: Once | CUTANEOUS | Status: DC
Start: 2016-01-29 — End: 2016-01-29

## 2016-01-29 MED ORDER — FENTANYL CITRATE (PF) 100 MCG/2ML IJ SOLN: 25.0000 ug | INTRAMUSCULAR | Status: DC | PRN

## 2016-01-29 MED ORDER — DEXAMETHASONE SODIUM PHOSPHATE 4 MG/ML IJ SOLN
INTRAMUSCULAR | Status: DC | PRN
  Administered 2016-01-29: 4 mg via INTRAVENOUS

## 2016-01-29 MED ORDER — LIDOCAINE 2% (20 MG/ML) 5 ML SYRINGE
INTRAMUSCULAR | Status: DC | PRN
  Administered 2016-01-29: 60 mg via INTRAVENOUS

## 2016-01-29 MED ORDER — CEFAZOLIN SODIUM-DEXTROSE 2-4 GM/100ML-% IV SOLN
2.0000 g | INTRAVENOUS | Status: AC
  Administered 2016-01-29: 2 g via INTRAVENOUS

## 2016-01-29 MED ORDER — FENTANYL CITRATE (PF) 100 MCG/2ML IJ SOLN
50.0000 ug | INTRAMUSCULAR | Status: DC | PRN
  Administered 2016-01-29: 100 ug via INTRAVENOUS

## 2016-01-29 MED ORDER — FENTANYL CITRATE (PF) 100 MCG/2ML IJ SOLN
INTRAMUSCULAR | Status: AC
  Filled 2016-01-29: qty 2

## 2016-01-29 MED ORDER — GABAPENTIN 300 MG PO CAPS
ORAL_CAPSULE | ORAL | Status: AC
  Filled 2016-01-29: qty 1

## 2016-01-29 MED ORDER — ONDANSETRON HCL 4 MG/2ML IJ SOLN
INTRAMUSCULAR | Status: DC | PRN
Start: 2016-01-29 — End: 2016-01-29
  Administered 2016-01-29: 4 mg via INTRAVENOUS

## 2016-01-29 MED ORDER — GLYCOPYRROLATE 0.2 MG/ML IJ SOLN: 0.2000 mg | Freq: Once | INTRAMUSCULAR | Status: DC | PRN

## 2016-01-29 MED ORDER — ACETAMINOPHEN 500 MG PO TABS
ORAL_TABLET | ORAL | Status: AC
  Filled 2016-01-29: qty 2

## 2016-01-29 MED ORDER — SCOPOLAMINE 1 MG/3DAYS TD PT72: 1.0000 | MEDICATED_PATCH | Freq: Once | TRANSDERMAL | Status: DC | PRN

## 2016-01-29 MED ORDER — ONDANSETRON HCL 4 MG/2ML IJ SOLN
INTRAMUSCULAR | Status: AC
  Filled 2016-01-29: qty 2

## 2016-01-29 MED ORDER — LACTATED RINGERS IV SOLN
INTRAVENOUS | Status: DC
  Administered 2016-01-29: 10 mL/h via INTRAVENOUS

## 2016-01-29 MED ORDER — BUPIVACAINE HCL (PF) 0.25 % IJ SOLN
INTRAMUSCULAR | Status: DC | PRN
  Administered 2016-01-29: 9 mL

## 2016-01-29 MED ORDER — GABAPENTIN 300 MG PO CAPS
300.0000 mg | ORAL_CAPSULE | ORAL | Status: AC
  Administered 2016-01-29: 300 mg via ORAL

## 2016-01-29 MED ORDER — HYDROCODONE-ACETAMINOPHEN 10-325 MG PO TABS: 1.0000 | ORAL_TABLET | Freq: Four times a day (QID) | ORAL | 0 refills | Status: DC | PRN

## 2016-01-29 MED ORDER — DEXAMETHASONE SODIUM PHOSPHATE 10 MG/ML IJ SOLN
INTRAMUSCULAR | Status: AC
  Filled 2016-01-29: qty 1

## 2016-01-29 MED ORDER — LIDOCAINE 2% (20 MG/ML) 5 ML SYRINGE
INTRAMUSCULAR | Status: AC
  Filled 2016-01-29: qty 5

## 2016-01-29 MED ORDER — CEFAZOLIN SODIUM-DEXTROSE 2-4 GM/100ML-% IV SOLN
INTRAVENOUS | Status: AC
  Filled 2016-01-29: qty 100

## 2016-01-29 MED ORDER — SUCCINYLCHOLINE 20MG/ML (10ML) SYRINGE FOR MEDFUSION PUMP - OPTIME
INTRAMUSCULAR | Status: DC | PRN
  Administered 2016-01-29: 80 mg via INTRAVENOUS

## 2016-01-29 MED ORDER — PROPOFOL 10 MG/ML IV BOLUS
INTRAVENOUS | Status: DC | PRN
  Administered 2016-01-29: 100 mg via INTRAVENOUS

## 2016-01-29 SURGICAL SUPPLY — 47 items
ADH SKN CLS APL DERMABOND .7 (GAUZE/BANDAGES/DRESSINGS) ×1
BINDER BREAST LRG (GAUZE/BANDAGES/DRESSINGS) IMPLANT
BLADE SURG 15 STRL LF DISP TIS (BLADE) ×1 IMPLANT
BLADE SURG 15 STRL SS (BLADE) ×3
CHLORAPREP W/TINT 26ML (MISCELLANEOUS) ×3 IMPLANT
CLIP TI WIDE RED SMALL 6 (CLIP) ×2 IMPLANT
CLOSURE WOUND 1/2 X4 (GAUZE/BANDAGES/DRESSINGS) ×1
COVER BACK TABLE 60X90IN (DRAPES) ×3 IMPLANT
COVER MAYO STAND STRL (DRAPES) ×3 IMPLANT
COVER PROBE W GEL 5X96 (DRAPES) ×3 IMPLANT
DERMABOND ADVANCED (GAUZE/BANDAGES/DRESSINGS) ×2
DERMABOND ADVANCED .7 DNX12 (GAUZE/BANDAGES/DRESSINGS) ×1 IMPLANT
DEVICE DUBIN W/COMP PLATE 8390 (MISCELLANEOUS) ×3 IMPLANT
DRAPE LAPAROSCOPIC ABDOMINAL (DRAPES) ×3 IMPLANT
DRAPE UTILITY XL STRL (DRAPES) ×3 IMPLANT
ELECT COATED BLADE 2.86 ST (ELECTRODE) ×3 IMPLANT
ELECT REM PT RETURN 9FT ADLT (ELECTROSURGICAL) ×3
ELECTRODE REM PT RTRN 9FT ADLT (ELECTROSURGICAL) ×1 IMPLANT
GLOVE BIO SURGEON STRL SZ7 (GLOVE) ×6 IMPLANT
GLOVE BIO SURGEON STRL SZ7.5 (GLOVE) ×2 IMPLANT
GLOVE BIOGEL PI IND STRL 7.5 (GLOVE) ×1 IMPLANT
GLOVE BIOGEL PI IND STRL 8 (GLOVE) IMPLANT
GLOVE BIOGEL PI INDICATOR 7.5 (GLOVE) ×2
GLOVE BIOGEL PI INDICATOR 8 (GLOVE) ×2
GOWN STRL REUS W/ TWL LRG LVL3 (GOWN DISPOSABLE) ×2 IMPLANT
GOWN STRL REUS W/ TWL XL LVL3 (GOWN DISPOSABLE) IMPLANT
GOWN STRL REUS W/TWL LRG LVL3 (GOWN DISPOSABLE) ×3
GOWN STRL REUS W/TWL XL LVL3 (GOWN DISPOSABLE) ×3
HEMOSTAT ARISTA ABSORB 3G PWDR (MISCELLANEOUS) ×2 IMPLANT
KIT MARKER MARGIN INK (KITS) ×3 IMPLANT
NDL HYPO 25X1 1.5 SAFETY (NEEDLE) ×1 IMPLANT
NEEDLE HYPO 25X1 1.5 SAFETY (NEEDLE) ×3 IMPLANT
PACK BASIN DAY SURGERY FS (CUSTOM PROCEDURE TRAY) ×3 IMPLANT
PENCIL BUTTON HOLSTER BLD 10FT (ELECTRODE) ×3 IMPLANT
SLEEVE SCD COMPRESS KNEE MED (MISCELLANEOUS) ×3 IMPLANT
SPONGE LAP 4X18 X RAY DECT (DISPOSABLE) ×3 IMPLANT
STRIP CLOSURE SKIN 1/2X4 (GAUZE/BANDAGES/DRESSINGS) ×2 IMPLANT
SUT MNCRL AB 4-0 PS2 18 (SUTURE) ×3 IMPLANT
SUT VIC AB 2-0 SH 27 (SUTURE) ×3
SUT VIC AB 2-0 SH 27XBRD (SUTURE) ×1 IMPLANT
SUT VIC AB 3-0 SH 27 (SUTURE) ×3
SUT VIC AB 3-0 SH 27X BRD (SUTURE) ×1 IMPLANT
SYR CONTROL 10ML LL (SYRINGE) ×3 IMPLANT
TOWEL OR 17X24 6PK STRL BLUE (TOWEL DISPOSABLE) ×3 IMPLANT
TOWEL OR NON WOVEN STRL DISP B (DISPOSABLE) ×3 IMPLANT
TUBE CONNECTING 20'X1/4 (TUBING)
TUBE CONNECTING 20X1/4 (TUBING) IMPLANT

## 2016-01-29 NOTE — Discharge Instructions (Signed)
[facility] °Office Phone Number [phone] ° °POST OP INSTRUCTIONS ° °Always review your discharge instruction sheet given to you by the facility where your surgery was performed. ° °IF YOU HAVE DISABILITY OR FAMILY LEAVE FORMS, YOU MUST BRING THEM TO THE OFFICE FOR PROCESSING.  DO NOT GIVE THEM TO YOUR DOCTOR. ° °1. A prescription for pain medication may be given to you upon discharge.  Take your pain medication as prescribed, if needed.  If narcotic pain medicine is not needed, then you may take acetaminophen (Tylenol), naprosyn (Alleve) or ibuprofen (Advil) as needed. °2. Take your usually prescribed medications unless otherwise directed °3. If you need a refill on your pain medication, please contact your pharmacy.  They will contact our office to request authorization.  Prescriptions will not be filled after 5pm or on week-ends. °4. You should eat very light the first 24 hours after surgery, such as soup, crackers, pudding, etc.  Resume your normal diet the day after surgery. °5. Most patients will experience some swelling and bruising in the breast.  Ice packs and a good support bra will help.  Wear the breast binder provided or a sports bra for 72 hours day and night.  After that wear a sports bra during the day until you return to the office. Swelling and bruising can take several days to resolve.  °6. It is common to experience some constipation if taking pain medication after surgery.  Increasing fluid intake and taking a stool softener will usually help or prevent this problem from occurring.  A mild laxative (Milk of Magnesia or Miralax) should be taken according to package directions if there are no bowel movements after 48 hours. °7. Unless discharge instructions indicate otherwise, you may remove your bandages 48 hours after surgery and you may shower at that time.  You may have steri-strips (small skin tapes) in place directly over the incision.  These strips should be left on the  skin for 7-10 days and will come off on their own.  If your surgeon used skin glue on the incision, you may shower in 24 hours.  The glue will flake off over the next 2-3 weeks.  Any sutures or staples will be removed at the office during your follow-up visit. °8. ACTIVITIES:  You may resume regular daily activities (gradually increasing) beginning the next day.  Wearing a good support bra or sports bra minimizes pain and swelling.  You may have sexual intercourse when it is comfortable. °a. You may drive when you no longer are taking prescription pain medication, you can comfortably wear a seatbelt, and you can safely maneuver your car and apply brakes. °b. RETURN TO WORK:  ______________________________________________________________________________________ °9. You should see your doctor in the office for a follow-up appointment approximately two weeks after your surgery.  Your doctor’s nurse will typically make your follow-up appointment when she calls you with your pathology report.  Expect your pathology report 3-4 business days after your surgery.  You may call to check if you do not hear from us after three days. °10. OTHER INSTRUCTIONS: _______________________________________________________________________________________________ _____________________________________________________________________________________________________________________________________ °_____________________________________________________________________________________________________________________________________ °_____________________________________________________________________________________________________________________________________ ° °WHEN TO CALL DR WAKEFIELD: °1. Fever over 101.0 °2. Nausea and/or vomiting. °3. Extreme swelling or bruising. °4. Continued bleeding from incision. °5. Increased pain, redness, or drainage from the incision. ° °The clinic staff is available to answer your questions during regular  business hours.  Please don’t hesitate to call and ask to speak to one of the nurses for clinical concerns.  If   you have a medical emergency, go to the nearest emergency room or call 911.  A surgeon from [facility] is always on call at the hospital. ° °For further questions, please visit [email or web address] ° ° ° °Post Anesthesia Home Care Instructions ° °Activity: °Get plenty of rest for the remainder of the day. A responsible adult should stay with you for 24 hours following the procedure.  °For the next 24 hours, DO NOT: °-Drive a car °-Operate machinery °-Drink alcoholic beverages °-Take any medication unless instructed by your physician °-Make any legal decisions or sign important papers. ° °Meals: °Start with liquid foods such as gelatin or soup. Progress to regular foods as tolerated. Avoid greasy, spicy, heavy foods. If nausea and/or vomiting occur, drink only clear liquids until the nausea and/or vomiting subsides. Call your physician if vomiting continues. ° °Special Instructions/Symptoms: °Your throat may feel dry or sore from the anesthesia or the breathing tube placed in your throat during surgery. If this causes discomfort, gargle with warm salt water. The discomfort should disappear within 24 hours. ° °If you had a scopolamine patch placed behind your ear for the management of post- operative nausea and/or vomiting: ° °1. The medication in the patch is effective for 72 hours, after which it should be removed.  Wrap patch in a tissue and discard in the trash. Wash hands thoroughly with soap and water. °2. You may remove the patch earlier than 72 hours if you experience unpleasant side effects which may include dry mouth, dizziness or visual disturbances. °3. Avoid touching the patch. Wash your hands with soap and water after contact with the patch. °  ° °

## 2016-01-29 NOTE — Op Note (Signed)
Preoperative diagnosis: Left breast cancer, clinical stage 1 Postoperative diagnosis: same as above Procedure: Left breast seed guided lumpectomy  Surgeon Dr Serita Grammes Anes general  EBL: minimal Comps none Specimen:  1. Leftbreast marked with paint 2. Additional medial, lateral and superior marginsmarked short superior, long lateral, double deep Sponge count correct at completion dispo to recovery stable  Indications: This is an 65 yof with newly diagnosed left breast cancer. We discussed options and decided to proceed with left lumpectomy.   Procedure: After informed consent was obtained the patient was taken to the OR.  She was given anitibiotics. SCDs were in place. She was prepped and draped in the standard sterile surgical fashion. A timeout was performed. I then located the seedin the llateral left breast. I infiltrated marcaine and made a curvilinear incision in the uoq near the axilla.  I then removed the seed with an attempt to get a clear margin. This was passed off the table after marking with paint. I then removed an additional lateral, medial and superior margin and marked as above. I did confirm removal of theclipand seedwith mammography.I placed clips in the cavity. I obtained hemostasis.I placed arista in the entire cavity. I closed the breast tissue with 2-0 vicryl. I closed the skin with 3-0 vicryl and 4-0 monocryl. Glue and steristrips were placed A breast binder was placed. She was extubated and transferred to recovery stable

## 2016-01-29 NOTE — H&P (Signed)
82 yof seen in consult from Dr Teryl Lucy. she has no prior breast history and underwent screening mm. she is visually impaired. she is here with her husband today. she had no mass or dc. she has family history in her two sisters at age 80 and age 49. the left breast had a mass on her screening mm. on [place] there is a 7x7x7 mm mass and [place] of axilla is negative. this underwent biopsy that shows grade I IDC with DCIS that is er/pr positive, her 2 negative and Ki is 10%. she has history of cabg in 2004 and sees cardiology here.    Other Problems Conni Slipper, RN; 01/01/2016 10:43 AM) Arthritis Congestive Heart Failure Depression High blood pressure Hypercholesterolemia Lump In Breast  Past Surgical History Conni Slipper, RN; 01/01/2016 10:43 AM) Breast Biopsy Left. Gallbladder Surgery - Open  Diagnostic Studies History Conni Slipper, RN; 01/01/2016 10:43 AM) Colonoscopy 1-5 years ago Mammogram within last year Pap Smear 1-5 years ago  Medication History Conni Slipper, RN; 01/01/2016 8:12 AM) No Current Medications Medications Reconciled  Social History Conni Slipper, RN; 01/01/2016 10:43 AM) Caffeine use Coffee, Tea. No alcohol use No drug use Tobacco use Never smoker.  Family History Conni Slipper, RN; 01/01/2016 10:43 AM) Breast Cancer Sister. Diabetes Mellitus Mother, Sister. Heart Disease Father. Hypertension Mother.  Pregnancy / Birth History Conni Slipper, RN; 01/01/2016 10:43 AM) Age at menarche 78 years. Age of menopause 81-55 Gravida 2 Length (months) of breastfeeding 7-12 Maternal age 64-35 Para 2    Review of Systems Conni Slipper RN; 01/01/2016 10:43 AM) General Not Present- Appetite Loss, Chills, Fatigue, Fever, Night Sweats, Weight Gain and Weight Loss. Skin Not Present- Change in Wart/Mole, Dryness, Hives, Jaundice, New Lesions, Non-Healing Wounds, Rash and Ulcer. HEENT Not Present- Earache, Hearing Loss, Hoarseness, Nose Bleed, Oral Ulcers,  Ringing in the Ears, Seasonal Allergies, Sinus Pain, Sore Throat, Visual Disturbances, Wears glasses/contact lenses and Yellow Eyes. Breast Present- Breast Mass. Not Present- Breast Pain, Nipple Discharge and Skin Changes. Cardiovascular Not Present- Chest Pain, Difficulty Breathing Lying Down, Leg Cramps, Palpitations, Rapid Heart Rate, Shortness of Breath and Swelling of Extremities. Gastrointestinal Not Present- Abdominal Pain, Bloating, Bloody Stool, Change in Bowel Habits, Chronic diarrhea, Constipation, Difficulty Swallowing, Excessive gas, Gets full quickly at meals, Hemorrhoids, Indigestion, Nausea, Rectal Pain and Vomiting. Female Genitourinary Present- Frequency and Nocturia. Not Present- Painful Urination, Pelvic Pain and Urgency. Neurological Not Present- Decreased Memory, Fainting, Headaches, Numbness, Seizures, Tingling, Tremor, Trouble walking and Weakness. Psychiatric Present- Depression and Frequent crying. Not Present- Anxiety, Bipolar, Change in Sleep Pattern and Fearful. Endocrine Not Present- Cold Intolerance, Excessive Hunger, Hair Changes, Heat Intolerance, Hot flashes and New Diabetes. Hematology Not Present- Blood Thinners, Easy Bruising, Excessive bleeding, Gland problems, HIV and Persistent Infections.   Physical Exam [name] MD; 01/01/2016 5:32 PM) General Mental Status-Alert. Orientation-Oriented X3.  Chest and Lung Exam Chest and lung exam reveals -on auscultation, normal breath sounds, no adventitious sounds and normal vocal resonance.  Breast Nipples-No Discharge. Breast Lump-No Palpable Breast Mass.  Cardiovascular Cardiovascular examination reveals -normal heart sounds, regular rate and rhythm with no murmurs.  Lymphatic Head & Neck  General Head & Neck Lymphatics: Bilateral - Description - Normal. Axillary  General Axillary Region: Bilateral - Description - Normal. Note: no [place] adenopathy   Assessment & Plan [name] MD; 01/01/2016 5:36 PM) BREAST CANCER OF UPPER-OUTER QUADRANT OF LEFT FEMALE BREAST (C50.412) Story: Left breast seed guided lumpectomy We discussed the staging and pathophysiology  of breast cancer. We discussed all of the different options for treatment for breast cancer including surgery, chemotherapy, radiation therapy, Herceptin, and antiestrogen therapy. At [facility] we decided she does not need a sentinel node biopsy in conjunction with Dr Sondra Come and Dr Lindi Adie. We discussed the options for treatment of the breast cancer which included lumpectomy versus a mastectomy. We discussed the performance of the lumpectomy with radioactive seed placement. We discussed a 5% chance of a positive margin requiring reexcision in the operating room. We also discussed that she will need radiation therapy if she undergoes lumpectomy. We discussed the mastectomy (removal of whole breast) and the postoperative care for that as well. Mastectomy can be followed by reconstruction. The decision for lumpectomy vs mastectomy has no impact on decision for chemotherapy. Most mastectomy patients will not need radiation therapy. We discussed that there is no difference in her survival whether she undergoes lumpectomy with radiation therapy or antiestrogen therapy versus a mastectomy. There is also no real difference between her recurrence in the breast. We discussed the risks of operation including bleeding, infection, possible reoperation. She understands her further therapy will be based on what her stages at the time of her operation.

## 2016-01-29 NOTE — Anesthesia Procedure Notes (Signed)
Procedure Name: Intubation Date/Time: 01/29/2016 9:38 AM Performed by: Baxter Flattery Pre-anesthesia Checklist: Patient identified, Emergency Drugs available, Suction available and Patient being monitored Patient Re-evaluated:Patient Re-evaluated prior to inductionOxygen Delivery Method: Circle system utilized Preoxygenation: Pre-oxygenation with 100% oxygen Intubation Type: IV induction Ventilation: Mask ventilation without difficulty Laryngoscope Size: Miller and 2 Grade View: Grade I Tube type: Oral Number of attempts: 1 Airway Equipment and Method: Stylet Placement Confirmation: ETT inserted through vocal cords under direct vision,  positive ETCO2 and breath sounds checked- equal and bilateral Secured at: 21 cm Tube secured with: Tape Dental Injury: Teeth and Oropharynx as per pre-operative assessment

## 2016-01-29 NOTE — Anesthesia Preprocedure Evaluation (Addendum)
Anesthesia Evaluation  Patient identified by MRN, date of birth, ID band Patient awake    Reviewed: Allergy & Precautions, H&P , NPO status , Patient's Chart, lab work & pertinent test results  Airway Mallampati: III  TM Distance: >3 FB Neck ROM: Full    Dental no notable dental hx. (+) Upper Dentures, Dental Advisory Given   Pulmonary neg pulmonary ROS,    Pulmonary exam normal breath sounds clear to auscultation       Cardiovascular hypertension, Pt. on medications and Pt. on home beta blockers + CAD   Rhythm:Regular Rate:Normal     Neuro/Psych negative neurological ROS  negative psych ROS   GI/Hepatic negative GI ROS, Neg liver ROS,   Endo/Other  negative endocrine ROS  Renal/GU negative Renal ROS  negative genitourinary   Musculoskeletal   Abdominal   Peds  Hematology negative hematology ROS (+)   Anesthesia Other Findings   Reproductive/Obstetrics negative OB ROS                            Anesthesia Physical Anesthesia Plan  ASA: III  Anesthesia Plan: General   Post-op Pain Management:    Induction: Intravenous  Airway Management Planned: LMA  Additional Equipment:   Intra-op Plan:   Post-operative Plan: Extubation in OR  Informed Consent: I have reviewed the patients History and Physical, chart, labs and discussed the procedure including the risks, benefits and alternatives for the proposed anesthesia with the patient or authorized representative who has indicated his/her understanding and acceptance.   Dental advisory given  Plan Discussed with: CRNA  Anesthesia Plan Comments:         Anesthesia Quick Evaluation

## 2016-01-29 NOTE — Anesthesia Postprocedure Evaluation (Signed)
Anesthesia Post Note  Patient: Olivia Gray  Procedure(s) Performed: Procedure(s) (LRB): LEFT BREAST LUMPECTOMY WITH RADIOACTIVE SEED LOCALIZATION (Left)  Patient location during evaluation: PACU Anesthesia Type: General Level of consciousness: awake and alert Pain management: pain level controlled Vital Signs Assessment: post-procedure vital signs reviewed and stable Respiratory status: spontaneous breathing, nonlabored ventilation and respiratory function stable Cardiovascular status: blood pressure returned to baseline and stable Postop Assessment: no signs of nausea or vomiting Anesthetic complications: no    Last Vitals:  Vitals:   01/29/16 1045 01/29/16 1102  BP:  (!) 117/55  Pulse: (!) 55 (!) 53  Resp: 14 18  Temp:  36.4 C    Last Pain:  Vitals:   01/29/16 1102  TempSrc:   PainSc: 0-No pain                 Lexie Koehl,W. EDMOND

## 2016-01-29 NOTE — Transfer of Care (Signed)
Immediate Anesthesia Transfer of Care Note  Patient: Olivia Gray  Procedure(s) Performed: Procedure(s): LEFT BREAST LUMPECTOMY WITH RADIOACTIVE SEED LOCALIZATION (Left)  Patient Location: PACU  Anesthesia Type:General  Level of Consciousness: awake, alert  and patient cooperative  Airway & Oxygen Therapy: Patient Spontanous Breathing and Patient connected to face mask oxygen  Post-op Assessment: Report given to RN, Post -op Vital signs reviewed and stable and Patient moving all extremities  Post vital signs: Reviewed and stable  Last Vitals:  Vitals:   01/29/16 1021 01/29/16 1022  BP:    Pulse: 61 60  Resp: 11 14  Temp:      Last Pain:  Vitals:   01/29/16 0820  TempSrc: Oral         Complications: No apparent anesthesia complications

## 2016-01-29 NOTE — Interval H&P Note (Signed)
History and Physical Interval Note:  01/29/2016 9:16 AM  Olivia Gray  has presented today for surgery, with the diagnosis of LEFT BREAST CANCER  The various methods of treatment have been discussed with the patient and family. After consideration of risks, benefits and other options for treatment, the patient has consented to  Procedure(s): LEFT BREAST LUMPECTOMY WITH RADIOACTIVE SEED LOCALIZATION (Left) as a surgical intervention .  The patient's history has been reviewed, patient examined, no change in status, stable for surgery.  I have reviewed the patient's chart and labs.  Questions were answered to the patient's satisfaction.     [name]

## 2016-01-31 ENCOUNTER — Encounter (HOSPITAL_BASED_OUTPATIENT_CLINIC_OR_DEPARTMENT_OTHER): Payer: Self-pay | Admitting: General Surgery

## 2016-02-04 ENCOUNTER — Telehealth: Payer: Self-pay | Admitting: *Deleted

## 2016-02-04 NOTE — Telephone Encounter (Signed)
Received call from family member asking about appointment dates and times. Gave him the patient's schedule. He stated,"well, we have some family issues going on and we may need to cancel these appointments and come next week. Informed patient that the doctor is already full for next week. It's best to keep this appointment and cancel the ones for Thursday. He stated,"I'll call scheduling and they can tell me a date and time to see the doctor next week. They can tell me but you can't." Informed him that scheduling won't be able to give him a date and time, all they do is cancel the appointment and notify Dr.Gudena. Dr.Gudena is in charge of his own schedule, no nurse or scheduler has the authority to schedule without his knowing. He said,"I'll call later if I decide to cancel the appointment." I offered to connect him to scheduling but he refused.

## 2016-02-05 ENCOUNTER — Encounter: Payer: Self-pay | Admitting: Hematology and Oncology

## 2016-02-05 ENCOUNTER — Ambulatory Visit (HOSPITAL_BASED_OUTPATIENT_CLINIC_OR_DEPARTMENT_OTHER): Payer: Medicare Other | Admitting: Hematology and Oncology

## 2016-02-05 DIAGNOSIS — Z170 Estrogen receptor positive status [ER+]: Secondary | ICD-10-CM

## 2016-02-05 DIAGNOSIS — C50412 Malignant neoplasm of upper-outer quadrant of left female breast: Secondary | ICD-10-CM

## 2016-02-05 NOTE — Progress Notes (Signed)
Patient Care Team: [name], MD as PCP - General (Internal Medicine) [name], MD as Consulting Physician (General Surgery) Nicholas Lose, MD as Consulting Physician (Hematology and Oncology) Gery Pray, MD as Consulting Physician (Radiation Oncology)  DIAGNOSIS: No matching staging information was found for the patient.  SUMMARY OF ONCOLOGIC HISTORY:   Breast cancer of upper-outer quadrant of left female breast ([place])   12/26/2015 Initial Diagnosis    Left breast biopsy 1:00: Invasive ductal carcinoma grade 1, DCIS,ER 95%, PR 95%, Ki-67 10%, HER-2 negative ratio 1.12; left breast mass 1:00 position 7 mm size axilla ultrasound negative, T1 BN 0 stage IA clinical stage      01/29/2016 Surgery    Left lumpectomy: IDC with DCIS, 1 cm, margins negative, fibrocystic changes,1/2 lymph nodes positive ( intramammary nodes)T1bN1 stage II a       CHIEF COMPLIANT: follow-up after recent left lumpectomy  INTERVAL HISTORY: Olivia Gray is a 82 year old with above-mentioned history left breast cancer treated with lumpectomy and is here today to discuss the pathology report. She is healing very well from the surgery. She has mild discomfort at the site of surgery. Denies any fevers or chills. Patient is visually impaired.  REVIEW OF SYSTEMS:   Constitutional: Denies fevers, chills or abnormal weight loss Eyes: Denies blurriness of vision Ears, nose, mouth, throat, and face: Denies mucositis or sore throat Respiratory: Denies cough, dyspnea or wheezes Cardiovascular: Denies palpitation, chest discomfort Gastrointestinal:  Denies nausea, heartburn or change in bowel habits Skin: Denies abnormal skin rashes Lymphatics: Denies new lymphadenopathy or easy bruising Neurological:Denies numbness, tingling or new weaknesses Behavioral/Psych: Mood is stable, no new changes  Extremities: No lower extremity edema Breast:  denies any pain or lumps or nodules in either breasts All other  systems were reviewed with the patient and are negative.  I have reviewed the past medical history, past surgical history, social history and family history with the patient and they are unchanged from previous note.  ALLERGIES:  has No Known Allergies.  MEDICATIONS:  Current Outpatient Prescriptions  Medication Sig Dispense Refill  . amLODipine (NORVASC) 10 MG tablet Take 1 tablet (10 mg total) by mouth daily. 30 tablet 6  . aspirin 81 MG tablet Take 81 mg by mouth daily.    . carvedilol (COREG) 3.125 MG tablet TAKE 1 TABLET BY MOUTH TWICE A DAY 60 tablet 11  . cholecalciferol (VITAMIN D) 1000 UNITS tablet Take 1,000 Units by mouth daily.    . cyanocobalamin 1000 MCG tablet Take 100 mcg by mouth daily.    Marland Kitchen escitalopram (LEXAPRO) 10 MG tablet Take 10 mg by mouth daily.    Marland Kitchen HYDROcodone-acetaminophen (NORCO) 10-325 MG tablet Take 1 tablet by mouth every 6 (six) hours as needed. 10 tablet 0  . ipratropium (ATROVENT) 0.06 % nasal spray Place 1 spray into the nose daily.    . irbesartan (AVAPRO) 300 MG tablet Take 1 tablet (300 mg total) by mouth daily. 30 tablet 11  . latanoprost (XALATAN) 0.005 % ophthalmic solution Place 1 drop into both eyes at bedtime.    . nitroGLYCERIN (NITROSTAT) 0.4 MG SL tablet Place 1 tablet (0.4 mg total) under the tongue every 5 (five) minutes as needed for chest pain. 25 tablet 6  . simvastatin (ZOCOR) 20 MG tablet Take 10 mg by mouth daily.     No current facility-administered medications for this visit.     PHYSICAL EXAMINATION: ECOG PERFORMANCE STATUS: 1 - Symptomatic but completely ambulatory  Vitals:   02/05/16  1037  BP: (!) 140/52  Pulse: (!) 52  Resp: 18  Temp: 97.8 F (36.6 C)   Filed Weights   02/05/16 1037  Weight: 153 lb (69.4 kg)    GENERAL:alert, no distress and comfortable SKIN: skin color, texture, turgor are normal, no rashes or significant lesions EYES: normal, Conjunctiva are pink and non-injected, sclera clear OROPHARYNX:no  exudate, no erythema and lips, buccal mucosa, and tongue normal  NECK: supple, thyroid normal size, non-tender, without nodularity LYMPH:  no palpable lymphadenopathy in the cervical, axillary or inguinal LUNGS: clear to auscultation and percussion with normal breathing effort HEART: regular rate & rhythm and no murmurs and no lower extremity edema ABDOMEN:abdomen soft, non-tender and normal bowel sounds MUSCULOSKELETAL:no cyanosis of digits and no clubbing  NEURO: alert & oriented x 3 with fluent speech, no focal motor/sensory deficits EXTREMITIES: No lower extremity edema  LABORATORY DATA:  I have reviewed the data as listed   Chemistry      Component Value Date/Time   NA 142 01/01/2016 1240   K 4.0 01/01/2016 1240   CL 110 07/31/2011 2102   CO2 20 (L) 01/01/2016 1240   BUN 12.0 01/01/2016 1240   CREATININE 0.9 01/01/2016 1240      Component Value Date/Time   CALCIUM 9.4 01/01/2016 1240   ALKPHOS 84 01/01/2016 1240   AST 17 01/01/2016 1240   ALT 9 01/01/2016 1240   BILITOT 1.16 01/01/2016 1240       Lab Results  Component Value Date   WBC 4.2 01/01/2016   HGB 13.3 01/01/2016   HCT 40.4 01/01/2016   MCV 89.9 01/01/2016   PLT 184 01/01/2016   NEUTROABS 2.4 01/01/2016     ASSESSMENT & PLAN:  Breast cancer of upper-outer quadrant of left female breast ([place]) Left lumpectomy 01/29/2016: IDC with DCIS, 1 cm, margins negative, fibrocystic changes,1/2 lymph nodes positive ( intramammary nodes)T1bN1 stage II a  Pathology counseling: I discussed the final pathology report of the patient provided  a copy of this report. I discussed the margins as well as lymph node surgeries. We also discussed the final staging along with previously performed ER/PR and HER-2/neu testing.  Recommendation: 1. Adjuvant radiation therapy 2. Followed by adjuvant antiestrogen therapy.  I strongly recommended that she receive adjuvant radiation based upon positive lymph node. She is not a  candidate for chemotherapy based on her age.  I will see her back in 3 months to start her on antiestrogen therapy.   No orders of the defined types were placed in this encounter.  The patient has a good understanding of the overall plan. she agrees with it. she will call with any problems that may develop before the next visit here.   Rulon Eisenmenger, MD 02/05/16

## 2016-02-05 NOTE — Assessment & Plan Note (Signed)
Left lumpectomy 01/29/2016: IDC with DCIS, 1 cm, margins negative, fibrocystic changes,1/2 lymph nodes positive ( intramammary nodes)T1bN1 stage II a  Pathology counseling: I discussed the final pathology report of the patient provided  a copy of this report. I discussed the margins as well as lymph node surgeries. We also discussed the final staging along with previously performed ER/PR and HER-2/neu testing.  Recommendation: 1. Adjuvant radiation therapy 2. Followed by adjuvant antiestrogen therapy.  I strongly recommended that she receive adjuvant radiation based upon positive lymph node. She is not a candidate for chemotherapy based on her age.  I will see her back in 3 months to start her on antiestrogen therapy.

## 2016-02-06 ENCOUNTER — Other Ambulatory Visit: Payer: Medicare Other

## 2016-02-06 ENCOUNTER — Encounter: Payer: Medicare Other | Admitting: Genetic Counselor

## 2016-02-07 NOTE — Addendum Note (Signed)
Addended by: Cheree Ditto on: 02/07/2016 11:48 AM   Modules accepted: Orders

## 2016-02-11 ENCOUNTER — Encounter: Payer: Self-pay | Admitting: Radiation Oncology

## 2016-02-11 NOTE — Progress Notes (Signed)
Location of Breast Cancer: Breast cancer of upper-outer quadrant of left female breast   Histology per Pathology Report:   01/29/16 Diagnosis 1. Breast, lumpectomy, Left with seed - INVASIVE AND IN SITU DUCTAL CARCINOMA, 1 CM. - MARGINS NOT INVOLVED. - FIBROCYSTIC CHANGES WITH CALCIFICATIONS. - PREVIOUS BIOPSY SITE. 2. Breast, excision, Left additional superior margin - BENIGN BREAST TISSUE. - NO EVIDENCE OF MALIGNANCY. - FINAL SUPERIOR MARGIN CLEAR. 3. Breast, excision, Left additional medial margin - BENIGN BREAST TISSUE WITH FIBROCYSTIC CHANGES. - NO EVIDENCE OF MALIGNANCY. - FINAL MEDIAL MARGIN CLEAR. 4. Breast, excision, Left additional lateral margin - FIBROFATTY BREAST TISSUE. - METASTATIC CARCINOMA IN ONE OF TWO LYMPH NODES (1/2). - FINAL LATERAL MARGIN CLEAR.  12/26/15 Diagnosis Breast, left, needle core biopsy, 1 o'clock INVASIVE DUCTAL CARCINOMA, GRADE 1 DUCTAL CARCINOMA IN SITU IS PRESENT  Receptor Status: ER(95%), PR (95%), Her2-neu (negative), Ki-(10%)  Did patient present with symptoms (if so, please note symptoms) or was this found on screening mammography?: screening mammogram  Past/Anticipated interventions by surgeon, if any: 01/29/16 - Procedure: LEFT BREAST LUMPECTOMY WITH RADIOACTIVE SEED LOCALIZATION;  Surgeon: [name], MD  Past/Anticipated interventions by medical oncology, if any: adjuvant antiestrogen therapy to follow radiation  Lymphedema issues, if any:  no  Pain issues, if any:  no   OB Gyn history: patient was 14 years with first menses.  She was 84 with birth of first child.  She has 2 children.  She has not used hormone therapy.  SAFETY ISSUES:  Prior radiation? no  Pacemaker/ICD? no  Possible current pregnancy?no  Is the patient on methotrexate? no  Current Complaints / other details:  Patient is here with her husband.  She is legaly blind.  She also said that 2 of her sisters also had breast cancer.  BP (!) 145/65 (BP  Location: Right Arm, Patient Position: Sitting)   Pulse (!) 58   Temp 98.2 F (36.8 C) (Oral)   Ht _0  (1.626 m)   Wt 151 lb 12.8 oz (68.9 kg)   SpO2 98%   BMI 26.06 kg/m    Wt Readings from Last 3 Encounters:  02/12/16 151 lb 12.8 oz (68.9 kg)  02/05/16 153 lb (69.4 kg)  01/29/16 151 lb (68.5 kg)

## 2016-02-12 ENCOUNTER — Encounter: Payer: Self-pay | Admitting: Radiation Oncology

## 2016-02-12 ENCOUNTER — Ambulatory Visit
Admission: RE | Admit: 2016-02-12 | Discharge: 2016-02-12 | Disposition: A | Discharge status: Home / Self Care | Payer: Medicare Other | Source: Ambulatory Visit | Attending: Radiation Oncology | Admitting: Radiation Oncology

## 2016-02-12 DIAGNOSIS — R7303 Prediabetes: Secondary | ICD-10-CM | POA: Diagnosis not present

## 2016-02-12 DIAGNOSIS — E785 Hyperlipidemia, unspecified: Secondary | ICD-10-CM | POA: Insufficient documentation

## 2016-02-12 DIAGNOSIS — C50412 Malignant neoplasm of upper-outer quadrant of left female breast: Secondary | ICD-10-CM | POA: Insufficient documentation

## 2016-02-12 DIAGNOSIS — I341 Nonrheumatic mitral (valve) prolapse: Secondary | ICD-10-CM | POA: Insufficient documentation

## 2016-02-12 DIAGNOSIS — Z7982 Long term (current) use of aspirin: Secondary | ICD-10-CM | POA: Insufficient documentation

## 2016-02-12 DIAGNOSIS — H3552 Pigmentary retinal dystrophy: Secondary | ICD-10-CM | POA: Diagnosis not present

## 2016-02-12 DIAGNOSIS — Z803 Family history of malignant neoplasm of breast: Secondary | ICD-10-CM | POA: Insufficient documentation

## 2016-02-12 DIAGNOSIS — I10 Essential (primary) hypertension: Secondary | ICD-10-CM | POA: Diagnosis not present

## 2016-02-12 DIAGNOSIS — Z170 Estrogen receptor positive status [ER+]: Secondary | ICD-10-CM | POA: Insufficient documentation

## 2016-02-12 DIAGNOSIS — Z951 Presence of aortocoronary bypass graft: Secondary | ICD-10-CM | POA: Insufficient documentation

## 2016-02-12 DIAGNOSIS — I2510 Atherosclerotic heart disease of native coronary artery without angina pectoris: Secondary | ICD-10-CM | POA: Diagnosis not present

## 2016-02-12 DIAGNOSIS — Z510 Encounter for antineoplastic radiation therapy: Secondary | ICD-10-CM | POA: Diagnosis not present

## 2016-02-12 DIAGNOSIS — Z79899 Other long term (current) drug therapy: Secondary | ICD-10-CM | POA: Diagnosis not present

## 2016-02-12 NOTE — Progress Notes (Signed)
Please see the Nurse Progress Note in the MD Initial Consult Encounter for this patient. 

## 2016-02-12 NOTE — Progress Notes (Signed)
Radiation Oncology         [phone] ________________________________  Re-evaluation note  Name: Olivia Gray MRN: [id number]  Date: 02/12/2016  DOB: 04-01-1933  SW:[id number],GYKZL, MD  Olivia Lose, MD   REFERRING PHYSICIAN: Nicholas Lose, MD, [name]  DIAGNOSIS: IDC with DCIS, 1 cm, margins negative, fibrocystic changes,1/2 lymph nodes positive ( intramammary nodes)T1bN1 stage II a  HISTORY OF PRESENT ILLNESS::Olivia Gray is an 82 year old female with the interval history as follows:    Breast cancer of upper-outer quadrant of left female breast ([place])   12/26/2015 Initial Diagnosis    Left breast biopsy 1:00: Invasive ductal carcinoma grade 1, DCIS,ER 95%, PR 95%, Ki-67 10%, HER-2 negative ratio 1.12; left breast mass 1:00 position 7 mm size axilla ultrasound negative, T1 BN 0 stage IA clinical stage     01/29/2016 Surgery    Left lumpectomy: IDC with DCIS, 1 cm, margins negative, fibrocystic changes,1/2 lymph nodes positive ( intramammary nodes)T1bN1 stage II a      She is currently being seen by Dr. Lindi Adie in medical oncology, and has been kindly referred to me for her considerations for radiotherapy.  The patient has had previous benign breast biopsies in 2006.   She has been doing very well since her surgery. She only took 1 prescription pain medication. She denies any obvious swelling in her left arm or shoulder. She denies any changes in range of movement in her left arm.    Of note: the patient is visually impaired   PREVIOUS RADIATION THERAPY: No  PAST MEDICAL HISTORY:  has a past medical history of Breast cancer of upper-outer quadrant of left female breast ([place]) (12/27/2015); CAD, multiple vessel (2004); Essential hypertension; Hyperlipidemia LDL goal <70; Mitral valve prolapse (02/07/2008; April 2015); Pre-diabetes; Retinitis pigmentosa of both eyes; and S/P CABG x 4 (2004).    PAST SURGICAL HISTORY: Past Surgical History:  Procedure Laterality  Date  . BREAST LUMPECTOMY WITH RADIOACTIVE SEED LOCALIZATION Left 01/29/2016   Procedure: LEFT BREAST LUMPECTOMY WITH RADIOACTIVE SEED LOCALIZATION;  Surgeon: [name], MD;  Location: [place] [facility];  Service: General;  Laterality: Left;  . BREAST SURGERY Bilateral    cyst removal  . CARDIAC CATHETERIZATION  05/22/2009   all grafts patent ,LV systolic fx normal,small -vessel diabetic disease in native vessels   . CHOLECYSTECTOMY  02/05/2005  . CORONARY ARTERY BYPASS GRAFT  07/21/2002   in [place] ; SVG-LAD, SVG-D1, SVG-OM1, SVG RPDA.  . DOPPLER ECHOCARDIOGRAPHY  02/07/2008; April 2015   EF=>55%; LV normal; mild anterior MVP with mild MR.; b) 07/2013: EF 55-60%. Normal wall motion. Gr 1 DD, mild MR with late systolic prolapse. Normal central venous and pulmonary arterial pressures   . NM MYOCAR PERF WALL MOTION  04/2009   EF 76%; mild ischeia basal inferolateral,mid inferoinlateral regions(s), abn pharmacologic nuc test, deffect indicte new ischemia,LV syst.fx normal --> cath showing no obstructive targets.  Marland Kitchen NM MYOVIEW LTD  June 2016   Normal perfusion. Normal study. LOW RISK. Normal EF (55%). Compared to prior study, no ischemic changes present.    FAMILY HISTORY: family history includes Breast cancer in her sister and sister.  SOCIAL HISTORY:  reports that she has never smoked. She has never used smokeless tobacco. She reports that she does not drink alcohol or use drugs.  ALLERGIES: Review of patient's allergies indicates no known allergies.  MEDICATIONS:  Current Outpatient Prescriptions  Medication Sig Dispense Refill  . amLODipine (NORVASC) 10 MG tablet Take 1 tablet (  10 mg total) by mouth daily. 30 tablet 6  . aspirin 81 MG tablet Take 81 mg by mouth daily.    . carvedilol (COREG) 3.125 MG tablet TAKE 1 TABLET BY MOUTH TWICE A DAY 60 tablet 11  . cholecalciferol (VITAMIN D) 1000 UNITS tablet Take 1,000 Units by mouth daily.    . cyanocobalamin 1000 MCG tablet  Take 100 mcg by mouth daily.    Marland Kitchen escitalopram (LEXAPRO) 10 MG tablet Take 10 mg by mouth daily.    Marland Kitchen ipratropium (ATROVENT) 0.06 % nasal spray Place 1 spray into the nose daily.    . irbesartan (AVAPRO) 300 MG tablet Take 1 tablet (300 mg total) by mouth daily. 30 tablet 11  . latanoprost (XALATAN) 0.005 % ophthalmic solution Place 1 drop into both eyes at bedtime.    . simvastatin (ZOCOR) 20 MG tablet Take 10 mg by mouth daily.    . diclofenac sodium (VOLTAREN) 1 % GEL APPLY TO AFFECTED AREA 4 TIMES A DAY AS NEEDED  5  . HYDROcodone-acetaminophen (NORCO) 10-325 MG tablet Take 1 tablet by mouth every 6 (six) hours as needed. (Patient not taking: Reported on 02/12/2016) 10 tablet 0  . nitroGLYCERIN (NITROSTAT) 0.4 MG SL tablet Place 1 tablet (0.4 mg total) under the tongue every 5 (five) minutes as needed for chest pain. (Patient not taking: Reported on 02/12/2016) 25 tablet 6   No current facility-administered medications for this encounter.     REVIEW OF SYSTEMS:  A 15 point review of systems is documented in the electronic medical record. This was obtained by the nursing staff. However, I reviewed this with the patient to discuss relevant findings and make appropriate changes.    The patient reports that she has not been having any pain in her left breast. The patient denies numbness in her arms bilaterally and also denies numbness along the chest wall. The patient reports that she does not sleep well at night, but this is not related to her diagnosis.     PHYSICAL EXAM:  height is _0  (1.626 m) and weight is 151 lb 12.8 oz (68.9 kg). Her oral temperature is 98.2 F (36.8 C). Her blood pressure is 145/65 (abnormal) and her pulse is 58 (abnormal). Her oxygen saturation is 98%.   General: Alert and oriented, in no acute distress Left breast is healing well, there's a scar on the upper-outer quadrant of the left breast.  No nipple discharge or bleeding, no signs of infection.    ECOG =  1  LABORATORY DATA:  Lab Results  Component Value Date   WBC 4.2 01/01/2016   HGB 13.3 01/01/2016   HCT 40.4 01/01/2016   MCV 89.9 01/01/2016   PLT 184 01/01/2016   NEUTROABS 2.4 01/01/2016   Lab Results  Component Value Date   NA 142 01/01/2016   K 4.0 01/01/2016   CL 110 07/31/2011   CO2 20 (L) 01/01/2016   GLUCOSE 122 01/01/2016   CREATININE 0.9 01/01/2016   CALCIUM 9.4 01/01/2016      RADIOGRAPHY: Mm Breast Surgical Specimen  Result Date: 01/29/2016 CLINICAL DATA:  82 year old female status post left lumpectomy EXAM: SPECIMEN RADIOGRAPH OF THE LEFT BREAST COMPARISON:  Previous exam(s). FINDINGS: Status post excision of the left breast. The radioactive seed and coil shaped biopsy marker clip are present, completely intact, and were marked for pathology. Findings were communicated to the operating room via telephone at the time of interpretation. IMPRESSION: Specimen radiograph of the left breast. Electronically Signed  By: Pamelia Hoit M.D.   On: 01/29/2016 10:01   Mm Digital Diagnostic Unilat L  Result Date: 02/11/2016 CLINICAL DATA:  Patient status post ultrasound-guided radioactive seed placement within a left breast mass. EXAM: DIAGNOSTIC LEFT MAMMOGRAM POST ULTRASOUND-GUIDED RADIOACTIVE SEED PLACEMENT COMPARISON:  Previous exam(s). FINDINGS: Mammographic images were obtained following ultrasound-guided radioactive seed placement. These demonstrate radioactive seed to be located next to the biopsy marking clip in appropriate position within the upper-outer left breast. IMPRESSION: Appropriate location of the radioactive seed. Final Assessment: Post Procedure Mammograms for Seed Placement Electronically Signed   By: Lovey Newcomer M.D.   On: 02/11/2016 11:20   [place] Lt Radioactive Seed Loc  Result Date: 02/11/2016 CLINICAL DATA:  Patient with recent diagnosis of left breast invasive ductal carcinoma for preoperative localization. EXAM: ULTRASOUND GUIDED RADIOACTIVE SEED  LOCALIZATION OF THE LEFT BREAST COMPARISON:  Previous exam(s). FINDINGS: Patient presents for radioactive seed localization prior to left breast lumpectomy. I met with the patient and we discussed the procedure of seed localization including benefits and alternatives. We discussed the high likelihood of a successful procedure. We discussed the risks of the procedure including infection, bleeding, tissue injury and further surgery. We discussed the low dose of radioactivity involved in the procedure. Informed, written consent was given. The usual time-out protocol was performed immediately prior to the procedure. Using ultrasound guidance, sterile technique, 1% lidocaine and an I-125 radioactive seed, mass within the upper-outer left breast was localized using a lateral approach. The follow-up mammogram images confirm the seed in the expected location and were marked for Dr. Donne Hazel. Follow-up survey of the patient confirms presence of the radioactive seed. Order number of I-125 seed:  [id number]. Total activity: 6.578 millicuries reference Date: 01/22/2016 The patient tolerated the procedure well and was released from the [facility]. She was given instructions regarding seed removal. IMPRESSION: Radioactive seed localization left breast. No apparent complications. Electronically Signed   By: Lovey Newcomer M.D.   On: 02/11/2016 11:31      IMPRESSION: Anyssa Sharpless is an 82 year old African American female with stage II invasive ductal carcinoma of the left breast.  At the time of her surgery she was found to have two lymph nodes within the lumpectomy specimen, one of which revealed metastatic spread.  We spoke with the patient and her husband how this changes the stage and prognosis, and would recommend radiation as part of her therapy.  We discussed consideration for axillary dissection, but feel that a better approach would be to cover the axilla concurrently with breast radiation.     PLAN: Today, I talked to  the patient and family about the findings and work-up thus far.  We discussed the natural history of breast cancer and general treatment, highlighting the role of radiotherapy in the management.  We discussed the available radiation techniques, and focused on the details of logistics and delivery.  We reviewed the anticipated acute and late sequelae associated with radiation in this setting.  The patient was encouraged to ask questions that I answered to the best of my ability. The patient is leaning towards receiving radiation treatment, but has not made her final decision.  We will tentatively schedule the patient for simulation approximately five weeks post op, and will cancel this appointment should the patient decide not to proceed with radiotherapy.  She does wish to proceed with hormonal therapy as part of her adjuvant treatment, and will meet with Dr. Lindi Adie again at a later date.    ------------------------------------------------  Blair Promise, PhD, MD        This document serves as a record of services personally performed by Gery Pray, MD. It was created on his behalf by Truddie Hidden, a trained medical scribe. The creation of this record is based on the scribe's personal observations and the provider's statements to them. This document has been checked and approved by the attending provider.

## 2016-03-03 ENCOUNTER — Ambulatory Visit
Admission: RE | Admit: 2016-03-03 | Discharge: 2016-03-03 | Disposition: A | Discharge status: Home / Self Care | Payer: Medicare Other | Source: Ambulatory Visit | Attending: Radiation Oncology | Admitting: Radiation Oncology

## 2016-03-03 DIAGNOSIS — Z510 Encounter for antineoplastic radiation therapy: Secondary | ICD-10-CM | POA: Diagnosis not present

## 2016-03-03 DIAGNOSIS — C50412 Malignant neoplasm of upper-outer quadrant of left female breast: Secondary | ICD-10-CM

## 2016-03-03 DIAGNOSIS — J300 Vasomotor rhinitis: Secondary | ICD-10-CM | POA: Insufficient documentation

## 2016-03-03 DIAGNOSIS — Z170 Estrogen receptor positive status [ER+]: Principal | ICD-10-CM

## 2016-03-03 NOTE — Progress Notes (Signed)
  Radiation Oncology         [phone]) [phone] ________________________________  Name: Olivia Gray MRN: [id number]  Date: 03/03/2016  DOB: [birth year]  SIMULATION AND TREATMENT PLANNING NOTE    ICD-9-CM ICD-10-CM   1. Malignant neoplasm of upper-outer quadrant of left breast in female, estrogen receptor positive ([place]) 174.4 C50.412    V86.0 Z17.0     DIAGNOSIS: Stage IIa (pT1b, pN1a) invasive ductal carcinoma with DCIS (ER/PR +, HER2 -)  NARRATIVE:  The patient was brought to the [facility].  Identity was confirmed.  All relevant records and images related to the planned course of therapy were reviewed.  The patient freely provided informed written consent to proceed with treatment after reviewing the details related to the planned course of therapy. The consent form was witnessed and verified by the simulation staff.  Then, the patient was set-up in a stable reproducible  supine position for radiation therapy.  CT images were obtained.  Surface markings were placed.  The CT images were loaded into the planning software.  Then the target and avoidance structures were contoured.  Treatment planning then occurred.  The radiation prescription was entered and confirmed.  Then, I designed and supervised the construction of a total of 5 medically necessary complex treatment devices.  I have requested : 3D Simulation  I have requested a DVH of the following structures: heart, lungs, lumpectomy cavity.  I have ordered: dose calc.  PLAN:  The patient will receive 60.4 Gy in 33 fractions, 50.4 Gy with initial setup then 10 Gy boost to lumpectomy cavity.  -----------------------------------     Blair Promise, PhD, MD  This document serves as a record of services personally performed by Gery Pray, MD. It was created on his behalf by Darcus Austin, a trained medical scribe. The creation of this record is based on the scribe's personal observations and the provider's statements to them. This  document has been checked and approved by the attending provider.

## 2016-03-08 NOTE — Progress Notes (Signed)
  Radiation Oncology         [phone] ________________________________  Name: Olivia Gray MRN: [id number]  Date: 03/03/2016  DOB: 10-27-1932  Optical Surface Tracking Plan:  Since intensity modulated radiotherapy (IMRT) and 3D conformal radiation treatment methods are predicated on accurate and precise positioning for treatment, intrafraction motion monitoring is medically necessary to ensure accurate and safe treatment delivery.  The ability to quantify intrafraction motion without excessive ionizing radiation dose can only be performed with optical surface tracking. Accordingly, surface imaging offers the opportunity to obtain 3D measurements of patient position throughout IMRT and 3D treatments without excessive radiation exposure.  I am ordering optical surface tracking for this patient's upcoming course of radiotherapy. ________________________________  Gery Pray, MD 03/08/2016 3:00 PM    Reference:   Ursula Alert, J, et al. Surface imaging-based analysis of intrafraction motion for breast radiotherapy patients.Journal of [employer], n. 6, nov. 2014. ISSN [id number].   Available at: <http://www.jacmp.org/index.php/jacmp/article/view/4957>.

## 2016-03-09 DIAGNOSIS — Z510 Encounter for antineoplastic radiation therapy: Secondary | ICD-10-CM | POA: Diagnosis not present

## 2016-03-10 ENCOUNTER — Telehealth: Payer: Self-pay | Admitting: Cardiology

## 2016-03-10 ENCOUNTER — Ambulatory Visit
Admission: RE | Admit: 2016-03-10 | Discharge: 2016-03-10 | Disposition: A | Discharge status: Home / Self Care | Payer: Medicare Other | Source: Ambulatory Visit | Attending: Radiation Oncology | Admitting: Radiation Oncology

## 2016-03-10 DIAGNOSIS — Z510 Encounter for antineoplastic radiation therapy: Secondary | ICD-10-CM | POA: Diagnosis not present

## 2016-03-10 DIAGNOSIS — C50412 Malignant neoplasm of upper-outer quadrant of left female breast: Secondary | ICD-10-CM

## 2016-03-10 DIAGNOSIS — Z170 Estrogen receptor positive status [ER+]: Principal | ICD-10-CM

## 2016-03-10 NOTE — Telephone Encounter (Signed)
[name] says he would like to speak to you about his wife's appointment tomorrow.Please call as soon as you can.

## 2016-03-10 NOTE — Progress Notes (Signed)
  Radiation Oncology         (336) [phone] ________________________________  Name: Olivia Gray MRN: [id number]  Date: 03/10/2016  DOB: Oct 13, 1932  Simulation Verification Note    ICD-9-CM ICD-10-CM   1. Malignant neoplasm of upper-outer quadrant of left breast in female, estrogen receptor positive ([facility]) 174.4 C50.412    V86.0 Z17.0     Status: outpatient  NARRATIVE: The patient was brought to the treatment unit and placed in the planned treatment position. The clinical setup was verified. Then port films were obtained and uploaded to the radiation oncology medical record software.  The treatment beams were carefully compared against the planned radiation fields. The position location and shape of the radiation fields was reviewed. They targeted volume of tissue appears to be appropriately covered by the radiation beams. Organs at risk appear to be excluded as planned.  Based on my personal review, I approved the simulation verification. The patient's treatment will proceed as planned.  -----------------------------------  Blair Promise, PhD, MD

## 2016-03-10 NOTE — Telephone Encounter (Signed)
Spoke to husband.  Husband was concerned if patient needs any further clearance for radiation treatment. He wanted to know if the oncologist had discuss with Dr Ellyn Hack.  RN informed patient that oncologist will only get in touch with cardiologist if symptoms arise after treatment started.  will defer to Dr Ellyn Hack AND CALL HUSBAND BACK.

## 2016-03-11 ENCOUNTER — Encounter: Payer: Self-pay | Admitting: *Deleted

## 2016-03-11 ENCOUNTER — Ambulatory Visit
Admission: RE | Admit: 2016-03-11 | Discharge: 2016-03-11 | Disposition: A | Discharge status: Home / Self Care | Payer: Medicare Other | Source: Ambulatory Visit | Attending: Radiation Oncology | Admitting: Radiation Oncology

## 2016-03-11 ENCOUNTER — Ambulatory Visit: Payer: Medicare Other | Admitting: Cardiology

## 2016-03-11 DIAGNOSIS — Z510 Encounter for antineoplastic radiation therapy: Secondary | ICD-10-CM | POA: Diagnosis not present

## 2016-03-11 NOTE — Telephone Encounter (Signed)
Late entry  spoke to husband earlier this morning.  per Dr Ellyn Hack, patient may reschedule appointment for today to another time  Due to patient starting radiation therapy.  If patientt is having certain kind of chemotherapy they mayaffect her heart recommend doing a pre echo prior to treatment and post echo  To see if any changes occur , but not necessary need for radiation. The oncologist is best suited to know what medications may require further evaluation.     Patient and husband decided to reschedule /cancel appointment for today. Verbalized understanding.  Letter mailed to patient with new appointment time in [place]

## 2016-03-12 ENCOUNTER — Ambulatory Visit
Admission: RE | Admit: 2016-03-12 | Discharge: 2016-03-12 | Disposition: A | Discharge status: Home / Self Care | Payer: Medicare Other | Source: Ambulatory Visit | Attending: Radiation Oncology | Admitting: Radiation Oncology

## 2016-03-12 DIAGNOSIS — Z510 Encounter for antineoplastic radiation therapy: Secondary | ICD-10-CM | POA: Diagnosis not present

## 2016-03-13 ENCOUNTER — Ambulatory Visit
Admission: RE | Admit: 2016-03-13 | Discharge: 2016-03-13 | Disposition: A | Discharge status: Home / Self Care | Payer: Medicare Other | Source: Ambulatory Visit | Attending: Radiation Oncology | Admitting: Radiation Oncology

## 2016-03-13 DIAGNOSIS — Z510 Encounter for antineoplastic radiation therapy: Secondary | ICD-10-CM | POA: Diagnosis not present

## 2016-03-15 ENCOUNTER — Ambulatory Visit
Admission: RE | Admit: 2016-03-15 | Discharge: 2016-03-15 | Disposition: A | Discharge status: Home / Self Care | Payer: Medicare Other | Source: Ambulatory Visit | Attending: Radiation Oncology | Admitting: Radiation Oncology

## 2016-03-15 DIAGNOSIS — Z510 Encounter for antineoplastic radiation therapy: Secondary | ICD-10-CM | POA: Diagnosis not present

## 2016-03-16 ENCOUNTER — Ambulatory Visit
Admission: RE | Admit: 2016-03-16 | Discharge: 2016-03-16 | Disposition: A | Discharge status: Home / Self Care | Payer: Medicare Other | Source: Ambulatory Visit | Attending: Radiation Oncology | Admitting: Radiation Oncology

## 2016-03-16 DIAGNOSIS — Z510 Encounter for antineoplastic radiation therapy: Secondary | ICD-10-CM | POA: Diagnosis not present

## 2016-03-17 ENCOUNTER — Telehealth: Payer: Self-pay | Admitting: Oncology

## 2016-03-17 ENCOUNTER — Inpatient Hospital Stay
Admission: RE | Admit: 2016-03-17 | Discharge: 2016-03-17 | Disposition: A | Discharge status: Home / Self Care | Payer: Self-pay | Source: Ambulatory Visit | Attending: Radiation Oncology | Admitting: Radiation Oncology

## 2016-03-17 ENCOUNTER — Ambulatory Visit
Admission: RE | Admit: 2016-03-17 | Discharge: 2016-03-17 | Disposition: A | Discharge status: Home / Self Care | Payer: Medicare Other | Source: Ambulatory Visit | Attending: Radiation Oncology | Admitting: Radiation Oncology

## 2016-03-17 ENCOUNTER — Encounter: Payer: Self-pay | Admitting: Radiation Oncology

## 2016-03-17 VITALS — BP 141/74 | HR 52 | Temp 98.2°F | Ht 64.0 in | Wt 150.8 lb

## 2016-03-17 DIAGNOSIS — C50412 Malignant neoplasm of upper-outer quadrant of left female breast: Secondary | ICD-10-CM

## 2016-03-17 DIAGNOSIS — Z170 Estrogen receptor positive status [ER+]: Principal | ICD-10-CM

## 2016-03-17 DIAGNOSIS — Z510 Encounter for antineoplastic radiation therapy: Secondary | ICD-10-CM | POA: Diagnosis not present

## 2016-03-17 MED ORDER — RADIAPLEXRX EX GEL
Freq: Once | CUTANEOUS | Status: AC
  Administered 2016-03-17: 15:00:00 via TOPICAL

## 2016-03-17 MED ORDER — ALRA NON-METALLIC DEODORANT (RAD-ONC)
1.0000 "application " | Freq: Once | TOPICAL | Status: AC
  Administered 2016-03-17: 1 via TOPICAL

## 2016-03-17 NOTE — Progress Notes (Signed)
Pt here for patient teaching.  Pt given Radiation and You booklet, skin care instructions, Alra deodorant and Radiaplex gel.  Reviewed areas of pertinence such as fatigue, skin changes, breast tenderness and breast swelling . Pt able to give teach back of to pat skin and use unscented/gentle soap,apply Radiaplex bid and avoid applying anything to skin within 4 hours of treatment. Pt demonstrated understanding and verbalizes understanding of information given and will contact nursing with any questions or concerns.          

## 2016-03-17 NOTE — Telephone Encounter (Signed)
Just let me know when rescheduled.  [place]

## 2016-03-17 NOTE — Progress Notes (Signed)
  Radiation Oncology         (336) [phone] ________________________________  Name: Olivia Gray MRN: [id number]  Date: 03/17/2016  DOB: [birth year]  Weekly Radiation Therapy Management    ICD-9-CM ICD-10-CM   1. Malignant neoplasm of upper-outer quadrant of left breast in female, estrogen receptor positive ([facility]) 174.4 C50.412    V86.0 Z17.0      Current Dose: 10.8 Gy     Planned Dose:  50.4 Gy  Narrative . . . . . . . . The patient presents for routine under treatment assessment.  The patient has completed 6 fractions to her left breast. She denies pain or fatigue.                                    The patient is without complaint.                                 Set-up films were reviewed.                                 The chart was checked. Physical Findings. . .  height is 5\' 4"  (1.626 m) and weight is 150 lb 12.8 oz (68.4 kg). Her oral temperature is 98.2 F (36.8 C). Her blood pressure is 141/74 (abnormal) and her pulse is 52 (abnormal). Her oxygen saturation is 97%. . Weight essentially stable.  No significant changes. Mild hyperpigmentation changes in the left breast. Impression . . . . . . . The patient is tolerating radiation. Plan . . . . . . . . . . . . Continue treatment as planned.  ________________________________   Blair Promise, PhD, MD  This document serves as a record of services personally performed by Gery Pray, MD. It was created on his behalf by Maryla Morrow, a trained medical scribe. The creation of this record is based on the scribe's personal observations and the provider's statements to them. This document has been checked and approved by the attending provider.

## 2016-03-17 NOTE — Telephone Encounter (Signed)
error 

## 2016-03-17 NOTE — Progress Notes (Signed)
Olivia Gray has completed 6 fractions to her left breast.  She denies having pain or fatigue.  The skin on her left breast is intact.  BP (!) 141/74 (BP Location: Right Arm, Patient Position: Sitting)   Pulse (!) 52   Temp 98.2 F (36.8 C) (Oral)   Ht 5\' 4"  (1.626 m)   Wt 150 lb 12.8 oz (68.4 kg)   SpO2 97%   BMI 25.88 kg/m    Wt Readings from Last 3 Encounters:  03/17/16 150 lb 12.8 oz (68.4 kg)  02/12/16 151 lb 12.8 oz (68.9 kg)  02/05/16 153 lb (69.4 kg)

## 2016-03-18 ENCOUNTER — Ambulatory Visit
Admission: RE | Admit: 2016-03-18 | Discharge: 2016-03-18 | Disposition: A | Discharge status: Home / Self Care | Payer: Medicare Other | Source: Ambulatory Visit | Attending: Radiation Oncology | Admitting: Radiation Oncology

## 2016-03-18 DIAGNOSIS — Z510 Encounter for antineoplastic radiation therapy: Secondary | ICD-10-CM | POA: Diagnosis not present

## 2016-03-20 ENCOUNTER — Ambulatory Visit: Payer: Medicare Other

## 2016-03-20 ENCOUNTER — Telehealth: Payer: Self-pay | Admitting: *Deleted

## 2016-03-20 NOTE — Telephone Encounter (Signed)
  Oncology Nurse Navigator Documentation  Navigator Location: [facility] (03/20/16 1100)   )Navigator Encounter Type: Telephone (03/20/16 1100) Telephone: Outgoing Call (03/20/16 1100)     Surgery Date: 01/29/16 (03/20/16 1100) Genetic Counseling Date:  (None) (03/20/16 1100) Genetic Counseling Type:  (None) (03/20/16 1100) Plastic Surgery Consult Date:  (None) (03/20/16 1100) Multidisiplinary Clinic Date: 01/01/16 (03/20/16 1100) Multidisiplinary Clinic Type: Breast (03/20/16 1100) Treatment Initiated Date: 01/29/16 (03/20/16 1100) Patient Visit Type: [id number] (03/20/16 1100) Treatment Phase: First Radiation Tx (03/20/16 1100) Barriers/Navigation Needs: No barriers at this time;No Questions;No Needs (03/20/16 1100)   Interventions: None required (03/20/16 1100)  Relate doing well with xrt. Denies needs or quesitons                    Time Spent with Patient: 15 (03/20/16 1100)

## 2016-03-23 ENCOUNTER — Ambulatory Visit
Admission: RE | Admit: 2016-03-23 | Discharge: 2016-03-23 | Disposition: A | Discharge status: Home / Self Care | Payer: Medicare Other | Source: Ambulatory Visit | Attending: Radiation Oncology | Admitting: Radiation Oncology

## 2016-03-23 DIAGNOSIS — Z510 Encounter for antineoplastic radiation therapy: Secondary | ICD-10-CM | POA: Diagnosis not present

## 2016-03-24 ENCOUNTER — Ambulatory Visit
Admission: RE | Admit: 2016-03-24 | Discharge: 2016-03-24 | Disposition: A | Discharge status: Home / Self Care | Payer: Medicare Other | Source: Ambulatory Visit | Attending: Radiation Oncology | Admitting: Radiation Oncology

## 2016-03-24 ENCOUNTER — Ambulatory Visit: Admission: RE | Admit: 2016-03-24 | Payer: Medicare Other | Source: Ambulatory Visit | Admitting: Radiation Oncology

## 2016-03-24 DIAGNOSIS — Z510 Encounter for antineoplastic radiation therapy: Secondary | ICD-10-CM | POA: Diagnosis not present

## 2016-03-25 ENCOUNTER — Ambulatory Visit
Admission: RE | Admit: 2016-03-25 | Discharge: 2016-03-25 | Disposition: A | Discharge status: Home / Self Care | Payer: Medicare Other | Source: Ambulatory Visit | Attending: Radiation Oncology | Admitting: Radiation Oncology

## 2016-03-25 DIAGNOSIS — Z510 Encounter for antineoplastic radiation therapy: Secondary | ICD-10-CM | POA: Diagnosis not present

## 2016-03-26 ENCOUNTER — Ambulatory Visit
Admission: RE | Admit: 2016-03-26 | Discharge: 2016-03-26 | Disposition: A | Discharge status: Home / Self Care | Payer: Medicare Other | Source: Ambulatory Visit | Attending: Radiation Oncology | Admitting: Radiation Oncology

## 2016-03-26 DIAGNOSIS — Z510 Encounter for antineoplastic radiation therapy: Secondary | ICD-10-CM | POA: Diagnosis not present

## 2016-03-27 ENCOUNTER — Ambulatory Visit
Admission: RE | Admit: 2016-03-27 | Discharge: 2016-03-27 | Disposition: A | Discharge status: Home / Self Care | Payer: Medicare Other | Source: Ambulatory Visit | Attending: Radiation Oncology | Admitting: Radiation Oncology

## 2016-03-27 DIAGNOSIS — Z510 Encounter for antineoplastic radiation therapy: Secondary | ICD-10-CM | POA: Diagnosis not present

## 2016-03-30 ENCOUNTER — Ambulatory Visit
Admission: RE | Admit: 2016-03-30 | Discharge: 2016-03-30 | Disposition: A | Discharge status: Home / Self Care | Payer: Medicare Other | Source: Ambulatory Visit | Attending: Radiation Oncology | Admitting: Radiation Oncology

## 2016-03-30 DIAGNOSIS — Z510 Encounter for antineoplastic radiation therapy: Secondary | ICD-10-CM | POA: Diagnosis not present

## 2016-03-31 ENCOUNTER — Ambulatory Visit
Admission: RE | Admit: 2016-03-31 | Discharge: 2016-03-31 | Disposition: A | Discharge status: Home / Self Care | Payer: Medicare Other | Source: Ambulatory Visit | Attending: Radiation Oncology | Admitting: Radiation Oncology

## 2016-03-31 ENCOUNTER — Encounter: Payer: Self-pay | Admitting: Radiation Oncology

## 2016-03-31 VITALS — BP 174/69 | HR 54 | Temp 98.0°F | Ht 64.0 in | Wt 152.2 lb

## 2016-03-31 DIAGNOSIS — Z510 Encounter for antineoplastic radiation therapy: Secondary | ICD-10-CM | POA: Diagnosis not present

## 2016-03-31 DIAGNOSIS — C50412 Malignant neoplasm of upper-outer quadrant of left female breast: Secondary | ICD-10-CM

## 2016-03-31 DIAGNOSIS — Z170 Estrogen receptor positive status [ER+]: Principal | ICD-10-CM

## 2016-03-31 NOTE — Progress Notes (Signed)
Melanni Rumpel has completed 14 fractions to her left breast.  She denies having pain.  She has noticed an increase in fatigue.  She is using radiaplex.  The skin on her left shoulder and breast has hyperpigmentation.    BP (!) 174/69 (BP Location: Right Arm, Patient Position: Sitting)   Pulse (!) 54   Temp 98 F (36.7 C) (Oral)   Ht 5\' 4"  (1.626 m)   Wt 152 lb 3.2 oz (69 kg)   SpO2 100%   BMI 26.13 kg/m    Wt Readings from Last 3 Encounters:  03/31/16 152 lb 3.2 oz (69 kg)  03/17/16 150 lb 12.8 oz (68.4 kg)  02/12/16 151 lb 12.8 oz (68.9 kg)

## 2016-03-31 NOTE — Progress Notes (Signed)
  Radiation Oncology         (336) [phone] ________________________________  Name: Olivia Gray MRN: [id number]  Date: 03/31/2016  DOB: 31-Jul-1932  Weekly Radiation Therapy Management    ICD-9-CM ICD-10-CM   1. Malignant neoplasm of upper-outer quadrant of left breast in female, estrogen receptor positive ([facility]) 174.4 C50.412    V86.0 Z17.0      Current Dose: 25.2 Gy     Planned Dose:  50.4 Gy  Narrative . . . . . . . . The patient presents for routine under treatment assessment.  The patient has completed 14 fractions to her left breast. She denies pain. The patient reports she has noticed an increase in fatigue. She is using radiaplex, and denies any skin complaints.                                    The patient is without complaint.                                 Set-up films were reviewed.                                 The chart was checked. Physical Findings. . .  height is 5\' 4"  (1.626 m) and weight is 152 lb 3.2 oz (69 kg). Her oral temperature is 98 F (36.7 C). Her blood pressure is 174/69 (abnormal) and her pulse is 54 (abnormal). Her oxygen saturation is 100%.  Weight essentially stable.  No significant changes. Slight hyperpigmentation changes in the left breast. Impression . . . . . . . The patient is tolerating radiation. Plan . . . . . . . . . . . . Continue treatment as planned.  ________________________________   Blair Promise, PhD, MD  This document serves as a record of services personally performed by Gery Pray, MD. It was created on his behalf by Maryla Morrow, a trained medical scribe. The creation of this record is based on the scribe's personal observations and the provider's statements to them. This document has been checked and approved by the attending provider.

## 2016-04-01 ENCOUNTER — Ambulatory Visit
Admission: RE | Admit: 2016-04-01 | Discharge: 2016-04-01 | Disposition: A | Discharge status: Home / Self Care | Payer: Medicare Other | Source: Ambulatory Visit | Attending: Radiation Oncology | Admitting: Radiation Oncology

## 2016-04-01 DIAGNOSIS — Z510 Encounter for antineoplastic radiation therapy: Secondary | ICD-10-CM | POA: Diagnosis not present

## 2016-04-02 ENCOUNTER — Ambulatory Visit
Admission: RE | Admit: 2016-04-02 | Discharge: 2016-04-02 | Disposition: A | Discharge status: Home / Self Care | Payer: Medicare Other | Source: Ambulatory Visit | Attending: Radiation Oncology | Admitting: Radiation Oncology

## 2016-04-02 DIAGNOSIS — Z510 Encounter for antineoplastic radiation therapy: Secondary | ICD-10-CM | POA: Diagnosis not present

## 2016-04-03 ENCOUNTER — Ambulatory Visit
Admission: RE | Admit: 2016-04-03 | Discharge: 2016-04-03 | Disposition: A | Discharge status: Home / Self Care | Payer: Medicare Other | Source: Ambulatory Visit | Attending: Radiation Oncology | Admitting: Radiation Oncology

## 2016-04-03 DIAGNOSIS — Z510 Encounter for antineoplastic radiation therapy: Secondary | ICD-10-CM | POA: Diagnosis not present

## 2016-04-06 ENCOUNTER — Ambulatory Visit
Admission: RE | Admit: 2016-04-06 | Discharge: 2016-04-06 | Disposition: A | Discharge status: Home / Self Care | Payer: Medicare Other | Source: Ambulatory Visit | Attending: Radiation Oncology | Admitting: Radiation Oncology

## 2016-04-06 DIAGNOSIS — Z510 Encounter for antineoplastic radiation therapy: Secondary | ICD-10-CM | POA: Diagnosis not present

## 2016-04-07 ENCOUNTER — Ambulatory Visit
Admission: RE | Admit: 2016-04-07 | Discharge: 2016-04-07 | Disposition: A | Discharge status: Home / Self Care | Payer: Medicare Other | Source: Ambulatory Visit | Attending: Radiation Oncology | Admitting: Radiation Oncology

## 2016-04-07 ENCOUNTER — Encounter: Payer: Self-pay | Admitting: Radiation Oncology

## 2016-04-07 VITALS — BP 122/64 | HR 50 | Temp 98.7°F | Resp 20 | Wt 151.6 lb

## 2016-04-07 DIAGNOSIS — Z510 Encounter for antineoplastic radiation therapy: Secondary | ICD-10-CM | POA: Diagnosis not present

## 2016-04-07 DIAGNOSIS — C50412 Malignant neoplasm of upper-outer quadrant of left female breast: Secondary | ICD-10-CM

## 2016-04-07 DIAGNOSIS — Z170 Estrogen receptor positive status [ER+]: Principal | ICD-10-CM

## 2016-04-07 NOTE — Progress Notes (Signed)
  Radiation Oncology         (336) [phone] ________________________________  Name: Olivia Gray MRN: [id number]  Date: 04/07/2016  DOB: 01-Apr-1933  Weekly Radiation Therapy Management    ICD-9-CM ICD-10-CM   1. Malignant neoplasm of upper-outer quadrant of left breast in female, estrogen receptor positive ([facility]) 174.4 C50.412    V86.0 Z17.0      Current Dose: 34.2 Gy     Planned Dose:  50.4 Gy  Narrative . . . . . . . . The patient presents for routine under treatment assessment.  The patient has completed 19 fractions to her left breast. Weight and vitals stable. She reports fatigue.  The patient reports tenderness to the left breast, with some itchyness. She reports a good appetite. Using radiaplex bid as directed.                                    The patient is without complaint.                                 Set-up films were reviewed.                                 The chart was checked. Physical Findings. . .  weight is 151 lb 9.6 oz (68.8 kg). Her oral temperature is 98.7 F (37.1 C). Her blood pressure is 122/64 and her pulse is 50 (abnormal). Her respiration is 20.  Weight essentially stable.  No significant changes. Heart regular in rate and rhythm. Lungs clear to auscultation bilaterally. Some slight hyperpigmentation changes in the left breast.  Impression . . . . . . . The patient is tolerating radiation. Plan . . . . . . . . . . . . Continue treatment as planned.  ________________________________   Blair Promise, PhD, MD  This document serves as a record of services personally performed by Gery Pray, MD. It was created on his behalf by Maryla Morrow, a trained medical scribe. The creation of this record is based on the scribe's personal observations and the provider's statements to them. This document has been checked and approved by the attending provider.

## 2016-04-07 NOTE — Progress Notes (Signed)
Weekly rad tzxs left breast, 19/28  Completed, vitals stable, wnl, thinning  Skin, under left inframmary  ,tender there, fold, tanning under axilla and breast, using radiaplex bid, appetite good, fatigued stated BP 122/64 (BP Location: Right Arm, Patient Position: Sitting, Cuff Size: Normal)   Pulse (!) 50   Temp 98.7 F (37.1 C) (Oral)   Resp 20   Wt 151 lb 9.6 oz (68.8 kg)   BMI 26.02 kg/m   Wt Readings from Last 3 Encounters:  04/07/16 151 lb 9.6 oz (68.8 kg)  03/31/16 152 lb 3.2 oz (69 kg)  03/17/16 150 lb 12.8 oz (68.4 kg)

## 2016-04-08 ENCOUNTER — Ambulatory Visit
Admission: RE | Admit: 2016-04-08 | Discharge: 2016-04-08 | Disposition: A | Discharge status: Home / Self Care | Payer: Medicare Other | Source: Ambulatory Visit | Attending: Radiation Oncology | Admitting: Radiation Oncology

## 2016-04-08 DIAGNOSIS — Z510 Encounter for antineoplastic radiation therapy: Secondary | ICD-10-CM | POA: Diagnosis not present

## 2016-04-09 ENCOUNTER — Ambulatory Visit
Admission: RE | Admit: 2016-04-09 | Discharge: 2016-04-09 | Disposition: A | Discharge status: Home / Self Care | Payer: Medicare Other | Source: Ambulatory Visit | Attending: Radiation Oncology | Admitting: Radiation Oncology

## 2016-04-09 DIAGNOSIS — Z510 Encounter for antineoplastic radiation therapy: Secondary | ICD-10-CM | POA: Diagnosis not present

## 2016-04-10 ENCOUNTER — Ambulatory Visit
Admission: RE | Admit: 2016-04-10 | Discharge: 2016-04-10 | Disposition: A | Discharge status: Home / Self Care | Payer: Medicare Other | Source: Ambulatory Visit | Attending: Radiation Oncology | Admitting: Radiation Oncology

## 2016-04-10 DIAGNOSIS — Z510 Encounter for antineoplastic radiation therapy: Secondary | ICD-10-CM | POA: Diagnosis not present

## 2016-04-13 ENCOUNTER — Ambulatory Visit
Admission: RE | Admit: 2016-04-13 | Discharge: 2016-04-13 | Disposition: A | Discharge status: Home / Self Care | Payer: Medicare Other | Source: Ambulatory Visit | Attending: Radiation Oncology | Admitting: Radiation Oncology

## 2016-04-13 DIAGNOSIS — Z510 Encounter for antineoplastic radiation therapy: Secondary | ICD-10-CM | POA: Diagnosis not present

## 2016-04-14 ENCOUNTER — Ambulatory Visit
Admission: RE | Admit: 2016-04-14 | Discharge: 2016-04-14 | Disposition: A | Discharge status: Home / Self Care | Payer: Medicare Other | Source: Ambulatory Visit | Attending: Radiation Oncology | Admitting: Radiation Oncology

## 2016-04-14 ENCOUNTER — Encounter: Payer: Self-pay | Admitting: Radiation Oncology

## 2016-04-14 VITALS — BP 139/75 | HR 55 | Temp 97.9°F | Ht 64.0 in | Wt 152.6 lb

## 2016-04-14 DIAGNOSIS — Z510 Encounter for antineoplastic radiation therapy: Secondary | ICD-10-CM | POA: Diagnosis not present

## 2016-04-14 DIAGNOSIS — C50412 Malignant neoplasm of upper-outer quadrant of left female breast: Secondary | ICD-10-CM | POA: Insufficient documentation

## 2016-04-14 DIAGNOSIS — Z170 Estrogen receptor positive status [ER+]: Secondary | ICD-10-CM | POA: Insufficient documentation

## 2016-04-14 MED ORDER — BIAFINE EX EMUL
Freq: Once | CUTANEOUS | Status: AC
  Administered 2016-04-14: 15:00:00 via TOPICAL

## 2016-04-14 NOTE — Progress Notes (Signed)
  Radiation Oncology         (336) [phone] ________________________________  Name: Olivia Gray MRN: [id number]  Date: 04/14/2016  DOB: 1932-05-12  Weekly Radiation Therapy Management    ICD-9-CM ICD-10-CM   1. Malignant neoplasm of upper-outer quadrant of left breast in female, estrogen receptor positive ([facility]) 174.4 C50.412 topical emolient (BIAFINE) emulsion   V86.0 Z17.0      Current Dose: 43.2 Gy     Planned Dose:  60.4 Gy  Narrative . . . . . . . . The patient presents for routine under treatment assessment.  Aleli Beed has completed 24 fractions to her left breast.  She denies having pain.  She reports having fatigue after treatments.  She is using radiaplex BID.  The skin on her left breast, axilla and shoulder has hyperpigmentation.  She reports itching under her arm and was given biafine to try.                                    The patient is without complaint.                                 Set-up films were reviewed.                                 The chart was checked. Physical Findings. . .  height is 5\' 4"  (1.626 m) and weight is 152 lb 9.6 oz (69.2 kg). Her oral temperature is 97.9 F (36.6 C). Her blood pressure is 139/75 and her pulse is 55 (abnormal). Her oxygen saturation is 100%.  Weight essentially stable.  No significant changes. Heart regular in rate and rhythm. Lungs clear to auscultation bilaterally. Some hyperpigmentation changes in the left breast. Some peeling in the inframammary fold, but no moist desquamation. Impression . . . . . . . The patient is tolerating radiation. Plan . . . . . . . . . . . . Continue treatment as planned.  ________________________________   Blair Promise, PhD, MD  This document serves as a record of services personally performed by Gery Pray, MD. It was created on his behalf by Darcus Austin, a trained medical scribe. The creation of this record is based on the scribe's personal observations and the provider's statements to  them. This document has been checked and approved by the attending provider.

## 2016-04-14 NOTE — Progress Notes (Signed)
Olivia Gray has completed 24 fractions to her left breast.  She denies having pain.  She reports having fatigue after treatments.  She is using radiaplex BID.  The skin on her left breast, axilla and shoulder has hyperpigmentation.  She reports having itching under her arm and been given biafine to try.  BP 139/75 (BP Location: Right Arm, Patient Position: Sitting)   Pulse (!) 55   Temp 97.9 F (36.6 C) (Oral)   Ht 5\' 4"  (1.626 m)   Wt 152 lb 9.6 oz (69.2 kg)   SpO2 100%   BMI 26.19 kg/m    Wt Readings from Last 3 Encounters:  04/14/16 152 lb 9.6 oz (69.2 kg)  04/07/16 151 lb 9.6 oz (68.8 kg)  03/31/16 152 lb 3.2 oz (69 kg)

## 2016-04-15 ENCOUNTER — Ambulatory Visit
Admission: RE | Admit: 2016-04-15 | Discharge: 2016-04-15 | Disposition: A | Discharge status: Home / Self Care | Payer: Medicare Other | Source: Ambulatory Visit | Attending: Radiation Oncology | Admitting: Radiation Oncology

## 2016-04-15 DIAGNOSIS — Z510 Encounter for antineoplastic radiation therapy: Secondary | ICD-10-CM | POA: Diagnosis not present

## 2016-04-16 ENCOUNTER — Ambulatory Visit
Admission: RE | Admit: 2016-04-16 | Discharge: 2016-04-16 | Disposition: A | Discharge status: Home / Self Care | Payer: Medicare Other | Source: Ambulatory Visit | Attending: Radiation Oncology | Admitting: Radiation Oncology

## 2016-04-16 DIAGNOSIS — Z510 Encounter for antineoplastic radiation therapy: Secondary | ICD-10-CM | POA: Diagnosis not present

## 2016-04-17 ENCOUNTER — Ambulatory Visit
Admission: RE | Admit: 2016-04-17 | Discharge: 2016-04-17 | Disposition: A | Discharge status: Home / Self Care | Payer: Medicare Other | Source: Ambulatory Visit | Attending: Radiation Oncology | Admitting: Radiation Oncology

## 2016-04-17 DIAGNOSIS — Z510 Encounter for antineoplastic radiation therapy: Secondary | ICD-10-CM | POA: Diagnosis not present

## 2016-04-21 ENCOUNTER — Ambulatory Visit
Admission: RE | Admit: 2016-04-21 | Discharge: 2016-04-21 | Disposition: A | Discharge status: Home / Self Care | Payer: Medicare Other | Source: Ambulatory Visit | Attending: Radiation Oncology | Admitting: Radiation Oncology

## 2016-04-21 ENCOUNTER — Encounter: Payer: Self-pay | Admitting: Radiation Oncology

## 2016-04-21 VITALS — BP 127/61 | HR 50 | Temp 97.8°F | Ht 64.0 in | Wt 151.2 lb

## 2016-04-21 DIAGNOSIS — Z170 Estrogen receptor positive status [ER+]: Principal | ICD-10-CM

## 2016-04-21 DIAGNOSIS — Z510 Encounter for antineoplastic radiation therapy: Secondary | ICD-10-CM | POA: Diagnosis not present

## 2016-04-21 DIAGNOSIS — C50412 Malignant neoplasm of upper-outer quadrant of left female breast: Secondary | ICD-10-CM

## 2016-04-21 NOTE — Progress Notes (Signed)
   Weekly Management Note:  Outpatient    ICD-9-CM ICD-10-CM   1. Malignant neoplasm of upper-outer quadrant of left breast in female, estrogen receptor positive ([facility]) 174.4 C50.412    V86.0 Z17.0     Current Dose:  50.4 Gy  Projected Dose: 50.4 Gy + boost   Narrative:  The patient presents for routine under treatment assessment.  CBCT/MVCT images/Port film x-rays were reviewed.  The chart was checked.   Olivia Gray presents for her 28th fraction of radiation to her Left Breast/ Axilla. She denies pain. She does report fatigue at times. She has hyperpigmentation to her Left Breast. She has areas of peeling to her left axilla and underneath her Left Breast. There is no drainage present. She is using Radiaplex/ Biafine to her Left Breast area. She has also been using hydrocortisone cream to these areas because of itching. She was given several samples of neosporin to use over the areas that are peeling.   Physical Findings:  height is 5\' 4"  (1.626 m) and weight is 151 lb 3.2 oz (68.6 kg). Her temperature is 97.8 F (36.6 C). Her blood pressure is 127/61 and her pulse is 50 (abnormal). Her oxygen saturation is 100%.   Wt Readings from Last 3 Encounters:  04/21/16 151 lb 3.2 oz (68.6 kg)  04/14/16 152 lb 9.6 oz (69.2 kg)  04/07/16 151 lb 9.6 oz (68.8 kg)   Patch of hyperpgimentation over her left upper back and along the left lower neck, and throughout the breast and axilla on the left. Very superficial peeling in the inframammary fold.  Impression:  The patient is tolerating radiotherapy.  Plan:  Continue radiotherapy as planned. She will begin using neosporin over the areas of peeling skin.   ________________________________   Eppie Gibson, M.D.   This document serves as a record of services personally performed by Eppie Gibson, MD. It was created on her behalf by Arlyce Harman, a trained medical scribe. The creation of this record is based on the scribe's personal observations and  the provider's statements to them. This document has been checked and approved by the attending provider.

## 2016-04-21 NOTE — Progress Notes (Signed)
Olivia Gray presents for her 28th fraction of radiation to her Left Breast/ Axilla. She denies pain. She does report fatigue at times. She has hyperpigmentation to her Left Breast. She has areas of peeling to her left axilla and underneath her Left Breast. There is no drainage present. She is using Radiaplex/ Biafine to her Left Breast area. She has also been using hydrocortisone cream to these areas because of itching. She was given several samples of neosporin to use over the areas that are peeling.   BP 127/61   Pulse (!) 50   Temp 97.8 F (36.6 C)   Ht 5\' 4"  (1.626 m)   Wt 151 lb 3.2 oz (68.6 kg)   SpO2 100% Comment: room air  BMI 25.95 kg/m    Wt Readings from Last 3 Encounters:  04/21/16 151 lb 3.2 oz (68.6 kg)  04/14/16 152 lb 9.6 oz (69.2 kg)  04/07/16 151 lb 9.6 oz (68.8 kg)

## 2016-04-22 ENCOUNTER — Ambulatory Visit
Admission: RE | Admit: 2016-04-22 | Discharge: 2016-04-22 | Disposition: A | Discharge status: Home / Self Care | Payer: Medicare Other | Source: Ambulatory Visit | Attending: Radiation Oncology | Admitting: Radiation Oncology

## 2016-04-22 DIAGNOSIS — Z510 Encounter for antineoplastic radiation therapy: Secondary | ICD-10-CM | POA: Diagnosis not present

## 2016-04-23 ENCOUNTER — Ambulatory Visit
Admission: RE | Admit: 2016-04-23 | Discharge: 2016-04-23 | Disposition: A | Discharge status: Home / Self Care | Payer: Medicare Other | Source: Ambulatory Visit | Attending: Radiation Oncology | Admitting: Radiation Oncology

## 2016-04-23 DIAGNOSIS — Z510 Encounter for antineoplastic radiation therapy: Secondary | ICD-10-CM | POA: Diagnosis not present

## 2016-04-24 ENCOUNTER — Ambulatory Visit
Admission: RE | Admit: 2016-04-24 | Discharge: 2016-04-24 | Disposition: A | Discharge status: Home / Self Care | Payer: Medicare Other | Source: Ambulatory Visit | Attending: Radiation Oncology | Admitting: Radiation Oncology

## 2016-04-24 DIAGNOSIS — Z510 Encounter for antineoplastic radiation therapy: Secondary | ICD-10-CM | POA: Diagnosis not present

## 2016-04-28 ENCOUNTER — Encounter: Payer: Self-pay | Admitting: Radiation Oncology

## 2016-04-28 ENCOUNTER — Ambulatory Visit
Admission: RE | Admit: 2016-04-28 | Discharge: 2016-04-28 | Disposition: A | Discharge status: Home / Self Care | Payer: Medicare Other | Source: Ambulatory Visit | Attending: Radiation Oncology | Admitting: Radiation Oncology

## 2016-04-28 VITALS — BP 153/67 | HR 49 | Temp 97.9°F | Ht 64.0 in | Wt 151.0 lb

## 2016-04-28 DIAGNOSIS — Z510 Encounter for antineoplastic radiation therapy: Secondary | ICD-10-CM | POA: Diagnosis not present

## 2016-04-28 DIAGNOSIS — Z170 Estrogen receptor positive status [ER+]: Secondary | ICD-10-CM | POA: Insufficient documentation

## 2016-04-28 DIAGNOSIS — C50412 Malignant neoplasm of upper-outer quadrant of left female breast: Secondary | ICD-10-CM | POA: Insufficient documentation

## 2016-04-28 MED ORDER — RADIAPLEXRX EX GEL
Freq: Once | CUTANEOUS | Status: AC
  Administered 2016-04-28: 14:00:00 via TOPICAL

## 2016-04-28 NOTE — Progress Notes (Signed)
Olivia Gray has completed 32 fractions to her left breast.  She denies having pain.  Her heart rate was low today at 49.  She does take Coreg.  She is using radiaplex and biafine.  She has been provided with a refill of radiaplex.  She reports having fatigue.  The skin on her left breast has hyperpigmentation and peeling under her arm and under her breast.  She also has a peeling on her left shoulder.  She is applying neosporin to the peeling areas.  BP (!) 153/67 (BP Location: Right Arm, Patient Position: Sitting)   Pulse (!) 49   Temp 97.9 F (36.6 C) (Oral)   Ht 5\' 4"  (1.626 m)   Wt 151 lb (68.5 kg)   SpO2 100%   BMI 25.92 kg/m    Wt Readings from Last 3 Encounters:  04/28/16 151 lb (68.5 kg)  04/21/16 151 lb 3.2 oz (68.6 kg)  04/14/16 152 lb 9.6 oz (69.2 kg)

## 2016-04-28 NOTE — Progress Notes (Signed)
  Radiation Oncology         (336) [phone] ________________________________  Name: Olivia Gray MRN: [id number]  Date: 04/28/2016  DOB: 1932/09/11  Weekly Radiation Therapy Management    ICD-9-CM ICD-10-CM   1. Malignant neoplasm of upper-outer quadrant of left breast in female, estrogen receptor positive ([facility]) 174.4 C50.412 hyaluronate sodium (RADIAPLEXRX) gel   V86.0 Z17.0      Current Dose: 57.6 Gy     Planned Dose:  60.4 Gy  Narrative . . . . . . . . The patient presents for routine under treatment assessment.  Cataleah Beron has completed 32 fractions to her left breast.  She denies having pain.  Her heart rate was low today at 49.  She does take Coreg.  She is using radiaplex and biafine.  She has been provided with a refill of radiaplex.  She reports having fatigue.  The skin on her left breast has hyperpigmentation and peeling under her arm and under her breast.  She also has a peeling on her left shoulder.  She is applying neosporin to the peeling areas. Reports some pruritus.                                  Set-up films were reviewed.                                 The chart was checked. Physical Findings. . .  height is 5\' 4"  (1.626 m) and weight is 151 lb (68.5 kg). Her oral temperature is 97.9 F (36.6 C). Her blood pressure is 153/67 (abnormal) and her pulse is 49 (abnormal). Her oxygen saturation is 100%.  Weight essentially stable.  No significant changes. Heart regular in rate and rhythm. Lungs clear to auscultation bilaterally. Hyperpigmentation changes in the treatment area, no skin breakdown. Impression . . . . . . . The patient is tolerating radiation. Plan . . . . . . . . . . . . Continue treatment as planned. Last treatment tomorrow . The patient will follow up on 06/11/16. ________________________________   Blair Promise, PhD, MD  This document serves as a record of services personally performed by Gery Pray, MD. It was created on his behalf by Darcus Austin, a  trained medical scribe. The creation of this record is based on the scribe's personal observations and the provider's statements to them. This document has been checked and approved by the attending provider.

## 2016-04-29 ENCOUNTER — Encounter: Payer: Self-pay | Admitting: *Deleted

## 2016-04-29 ENCOUNTER — Ambulatory Visit
Admission: RE | Admit: 2016-04-29 | Discharge: 2016-04-29 | Disposition: A | Discharge status: Home / Self Care | Payer: Medicare Other | Source: Ambulatory Visit | Attending: Radiation Oncology | Admitting: Radiation Oncology

## 2016-04-29 DIAGNOSIS — Z510 Encounter for antineoplastic radiation therapy: Secondary | ICD-10-CM | POA: Diagnosis not present

## 2016-05-02 ENCOUNTER — Other Ambulatory Visit: Payer: Self-pay | Admitting: Cardiology

## 2016-05-04 ENCOUNTER — Encounter: Payer: Self-pay | Admitting: Radiation Oncology

## 2016-05-04 NOTE — Progress Notes (Signed)
  Radiation Oncology         (336) [phone] ________________________________  Name: Olivia Gray MRN: [id number]  Date: 05/04/2016  DOB: Mar 25, 1933  End of Treatment Note  Diagnosis:  Malignant neoplasm of upper-outer quadrant of left breast in female, estrogen receptor positive ([place])  Indication for treatment:  Curative, breast conservation and elective coverage of the axillary region     Radiation treatment dates:  03/11/16-04/29/16  Site/dose:  1)Left breast/axilla / 50.4 Gy in 28 fractions   2) Left breast boost / 10 Gy in 5 fractions  Beams/energy:   1) 3D / 10X, 15X, 6X    2) Isodose plan/ 15X, 6X  Narrative: The patient tolerated radiation treatment relatively well.  During treatment, the patient exhibited hyperpigmentation in the treatment area with no pain.   Plan: The patient has completed radiation treatment. The patient will return to radiation oncology clinic for routine followup in one month. I advised them to call or return sooner if they have any questions or concerns related to their recovery or treatment.  -----------------------------------  Blair Promise, PhD, MD  This document serves as a record of services personally performed by Gery Pray, MD. It was created on his behalf by Bethann Humble, a trained medical scribe. The creation of this record is based on the scribe's personal observations and the provider's statements to them. This document has been checked and approved by the attending provider.

## 2016-05-06 NOTE — Assessment & Plan Note (Signed)
Left lumpectomy 01/29/2016: IDC with DCIS, 1 cm, margins negative, fibrocystic changes,1/2 lymph nodes positive ( intramammary nodes)T1bN1 stage II a  Adjuvant radiation therapy completed 04/29/16  Treatment Plan: adjuvant antiestrogen therapy with Letrozole 2.5 mg daily start 05/07/16   Letrozole Counseling: We discussed the risks and benefits of anti-estrogen therapy with aromatase inhibitors. These include but not limited to insomnia, hot flashes, mood changes, vaginal dryness, bone density loss, and weight gain. We strongly believe that the benefits far outweigh the risks. Patient understands these risks and consented to starting treatment. Planned treatment duration is 5 yrs.  RTC in 3 months

## 2016-05-07 ENCOUNTER — Ambulatory Visit (HOSPITAL_BASED_OUTPATIENT_CLINIC_OR_DEPARTMENT_OTHER): Payer: Medicare Other | Admitting: Hematology and Oncology

## 2016-05-07 ENCOUNTER — Encounter: Payer: Self-pay | Admitting: Hematology and Oncology

## 2016-05-07 DIAGNOSIS — C50412 Malignant neoplasm of upper-outer quadrant of left female breast: Secondary | ICD-10-CM | POA: Diagnosis not present

## 2016-05-07 DIAGNOSIS — Z170 Estrogen receptor positive status [ER+]: Secondary | ICD-10-CM

## 2016-05-07 DIAGNOSIS — M810 Age-related osteoporosis without current pathological fracture: Secondary | ICD-10-CM | POA: Diagnosis not present

## 2016-05-07 MED ORDER — LETROZOLE 2.5 MG PO TABS: 2.5000 mg | ORAL_TABLET | Freq: Every day | ORAL | 3 refills | Status: DC

## 2016-05-07 NOTE — Progress Notes (Signed)
Patient Care Team: [name], MD as PCP - General (Internal Medicine) [name], MD as Consulting Physician (General Surgery) Nicholas Lose, MD as Consulting Physician (Hematology and Oncology) Gery Pray, MD as Consulting Physician (Radiation Oncology)  DIAGNOSIS:  Encounter Diagnoses  Name Primary?  . Malignant neoplasm of upper-outer quadrant of left breast in female, estrogen receptor positive ([place])   . Age-related osteoporosis without current pathological fracture     SUMMARY OF ONCOLOGIC HISTORY:   Breast cancer of upper-outer quadrant of left female breast ([place])   12/26/2015 Initial Diagnosis    Left breast biopsy 1:00: Invasive ductal carcinoma grade 1, DCIS,ER 95%, PR 95%, Ki-67 10%, HER-2 negative ratio 1.12; left breast mass 1:00 position 7 mm size axilla ultrasound negative, T1 BN 0 stage IA clinical stage      01/29/2016 Surgery    Left lumpectomy: IDC with DCIS, 1 cm, margins negative, fibrocystic changes,1/2 lymph nodes positive ( intramammary nodes)T1bN1 stage II a      03/11/2016 - 04/29/2016 Radiation Therapy    Adj XRT      05/07/2016 -  Anti-estrogen oral therapy    Letrozole 2.5 mg daily along with Prolia every 6 months       CHIEF COMPLIANT: Follow-up after radiation therapy  INTERVAL HISTORY: Olivia Gray is a 83 year old with above-mentioned history of left breast cancer treated with lumpectomy followed by radiation and is here today to start antiestrogen therapy with letrozole. She had tolerated radiation fairly well. 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  REVIEW OF SYSTEMS:   Constitutional: Denies fevers, chills or abnormal weight loss Eyes: Loss of vision Ears, nose, mouth, throat, and face: Denies mucositis or sore throat Respiratory: Denies cough, dyspnea or wheezes Cardiovascular: Denies palpitation, chest discomfort Gastrointestinal:  Denies nausea, heartburn or change in bowel habits Skin: Denies abnormal skin  rashes Lymphatics: Denies new lymphadenopathy or easy bruising Neurological:Denies numbness, tingling or new weaknesses Behavioral/Psych: Mood is stable, no new changes  Extremities: No lower extremity edema Breast:  Radiation dermatitis All other systems were reviewed with the patient and are negative.  I have reviewed the past medical history, past surgical history, social history and family history with the patient and they are unchanged from previous note.  ALLERGIES:  has No Known Allergies.  MEDICATIONS:  Current Outpatient Prescriptions  Medication Sig Dispense Refill  . amLODipine (NORVASC) 10 MG tablet TAKE 1 TABLET BY MOUTH EVERY DAY 30 tablet 1  . aspirin 81 MG tablet Take 81 mg by mouth daily.    . carvedilol (COREG) 3.125 MG tablet TAKE 1 TABLET BY MOUTH TWICE A DAY 60 tablet 11  . cholecalciferol (VITAMIN D) 1000 UNITS tablet Take 1,000 Units by mouth daily.    . cyanocobalamin 1000 MCG tablet Take 100 mcg by mouth daily.    . diclofenac sodium (VOLTAREN) 1 % GEL APPLY TO AFFECTED AREA 4 TIMES A DAY AS NEEDED  5  . emollient (BIAFINE) cream Apply topically 2 (two) times daily.    Marland Kitchen escitalopram (LEXAPRO) 10 MG tablet Take 10 mg by mouth daily.    . hyaluronate sodium (RADIAPLEXRX) GEL Apply 1 application topically 2 (two) times daily.    Marland Kitchen ipratropium (ATROVENT) 0.06 % nasal spray Place 1 spray into the nose daily.    . irbesartan (AVAPRO) 300 MG tablet Take 1 tablet (300 mg total) by mouth daily. 30 tablet 11  . latanoprost (XALATAN) 0.005 % ophthalmic solution Place 1 drop into both eyes at bedtime.    Marland Kitchen  letrozole (FEMARA) 2.5 MG tablet Take 1 tablet (2.5 mg total) by mouth daily. 90 tablet 3  . nitroGLYCERIN (NITROSTAT) 0.4 MG SL tablet Place 1 tablet (0.4 mg total) under the tongue every 5 (five) minutes as needed for chest pain. 25 tablet 6  . non-metallic deodorant (ALRA) MISC Apply 1 application topically daily as needed.    . simvastatin (ZOCOR) 20 MG tablet Take  10 mg by mouth daily.     No current facility-administered medications for this visit.     PHYSICAL EXAMINATION: ECOG PERFORMANCE STATUS: 1 - Symptomatic but completely ambulatory  Vitals:   05/07/16 1054  BP: (!) 127/53  Pulse: 78  Resp: 18  Temp: 97.7 F (36.5 C)   Filed Weights   05/07/16 1054  Weight: 154 lb (69.9 kg)    GENERAL:alert, no distress and comfortable SKIN: skin color, texture, turgor are normal, no rashes or significant lesions EYES: Blindness OROPHARYNX:no exudate, no erythema and lips, buccal mucosa, and tongue normal  NECK: supple, thyroid normal size, non-tender, without nodularity LYMPH:  no palpable lymphadenopathy in the cervical, axillary or inguinal LUNGS: clear to auscultation and percussion with normal breathing effort HEART: regular rate & rhythm and no murmurs and no lower extremity edema ABDOMEN:abdomen soft, non-tender and normal bowel sounds MUSCULOSKELETAL:no cyanosis of digits and no clubbing  NEURO: alert & oriented x 3 with fluent speech, no focal motor/sensory deficits EXTREMITIES: No lower extremity edema  LABORATORY DATA:  I have reviewed the data as listed   Chemistry      Component Value Date/Time   NA 142 01/01/2016 1240   K 4.0 01/01/2016 1240   CL 110 07/31/2011 2102   CO2 20 (L) 01/01/2016 1240   BUN 12.0 01/01/2016 1240   CREATININE 0.9 01/01/2016 1240      Component Value Date/Time   CALCIUM 9.4 01/01/2016 1240   ALKPHOS 84 01/01/2016 1240   AST 17 01/01/2016 1240   ALT 9 01/01/2016 1240   BILITOT 1.16 01/01/2016 1240       Lab Results  Component Value Date   WBC 4.2 01/01/2016   HGB 13.3 01/01/2016   HCT 40.4 01/01/2016   MCV 89.9 01/01/2016   PLT 184 01/01/2016   NEUTROABS 2.4 01/01/2016    ASSESSMENT & PLAN:  Breast cancer of upper-outer quadrant of left female breast ([place]) Left lumpectomy 01/29/2016: IDC with DCIS, 1 cm, margins negative, fibrocystic changes,1/2 lymph nodes positive ( intramammary  nodes)T1bN1 stage II a  Adjuvant radiation therapy completed 04/29/16  Treatment Plan: adjuvant antiestrogen therapy with Letrozole 2.5 mg daily start 05/07/16 Osteoporosis: Patient has a prescription for Fosamax, 8 pills. She will finish those pills and then she will go on Prolia every 6 months. This is by her preference.  Letrozole Counseling: We discussed the risks and benefits of anti-estrogen therapy with aromatase inhibitors. These include but not limited to insomnia, hot flashes, mood changes, vaginal dryness, bone density loss, and weight gain. We strongly believe that the benefits far outweigh the risks. Patient understands these risks and consented to starting treatment. Planned treatment duration is 5 yrs.  RTC in 3 months for follow-up to discuss letrozole toxicities as well as to start Prolia injections.  I spent 25 minutes talking to the patient of which more than half was spent in counseling and coordination of care.  No orders of the defined types were placed in this encounter.  The patient has a good understanding of the overall plan. she agrees with it. she will  call with any problems that may develop before the next visit here.   Rulon Eisenmenger, MD 05/07/16

## 2016-05-22 ENCOUNTER — Ambulatory Visit (INDEPENDENT_AMBULATORY_CARE_PROVIDER_SITE_OTHER): Payer: Medicare Other | Admitting: Cardiology

## 2016-05-22 ENCOUNTER — Encounter: Payer: Self-pay | Admitting: Cardiology

## 2016-05-22 VITALS — BP 134/65 | HR 59 | Ht 64.0 in | Wt 152.0 lb

## 2016-05-22 DIAGNOSIS — I341 Nonrheumatic mitral (valve) prolapse: Secondary | ICD-10-CM | POA: Diagnosis not present

## 2016-05-22 DIAGNOSIS — I2510 Atherosclerotic heart disease of native coronary artery without angina pectoris: Secondary | ICD-10-CM

## 2016-05-22 DIAGNOSIS — I10 Essential (primary) hypertension: Secondary | ICD-10-CM | POA: Diagnosis not present

## 2016-05-22 DIAGNOSIS — E785 Hyperlipidemia, unspecified: Secondary | ICD-10-CM | POA: Diagnosis not present

## 2016-05-22 NOTE — Progress Notes (Signed)
PCP: [name], MD  Clinic Note: Chief Complaint  Patient presents with  . Follow-up    6 months; No Sx.  - has had Br Ca Rx - lumpecotmy, Xrt & now PO Chemo  . Coronary Artery Disease    HPI: Olivia Gray is a 83 y.o. female with a PMH below who presents today for delayed six-month follow-up for CAD-CABG. Last Myoview was 2016.  Olivia Gray was last seen on 08/13/2015  Recent Hospitalizations:  -She is currently undergoing Oral Chemo (Letrozole 2.5 mg daily along with Prolia every 6 months) after XRT for L Br Ca after Lumpectomy on Oct 2017.  Studies Reviewed: No recent studies  Interval History: Olivia Gray actually is doing pretty well overall from a cardiac standpoint. She notes an occasional skipped beats, nothing significant. She had a slip and fall a little while ago when she lost her balance, but was not related all to dizziness. She is now starting to do activity with calisthenics light weights and walking stairs. She denies any cardiac symptoms:   No chest pain or shortness of breath with rest or exertion. No PND, orthopnea or edema. No palpitations, lightheadedness, dizziness, weakness or syncope/near syncope. No TIA/amaurosis fugax symptoms. No claudication.  ROS: A comprehensive was performed. Review of Systems  Constitutional: Positive for malaise/fatigue (Gradually start (me back after her cancer treatments.).  HENT: Negative for nosebleeds.   Respiratory: Negative for shortness of breath.   Cardiovascular:       Per history of present illness  Gastrointestinal: Positive for heartburn (Depending on what she eats). Negative for abdominal pain, blood in stool, melena, nausea and vomiting.  Genitourinary: Negative for hematuria.  Musculoskeletal: Positive for joint pain.  Neurological: Negative for dizziness and focal weakness.  All other systems reviewed and are negative.    Past Medical History:  Diagnosis Date  . Breast cancer of upper-outer quadrant of  left female breast ([place]) 12/27/2015  . CAD, multiple vessel 2004   Myoview 2011: Mild Basal-mid Inferolateral "ischemia", EF ~76?% -- Anormal, but LOW RISK  . Essential hypertension   . Hyperlipidemia LDL goal <70    On statin.  . Mitral valve prolapse 02/07/2008; April 2015   a) Mild Anterior MVP, with mild MR.;; b) Echo 07/2013: EF 55-60%, Gr 1 DD, no WMA, Mild central MR with late systolic prolapse   . Pre-diabetes   . Retinitis pigmentosa of both eyes     now essentially legally blind.  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 S/P CABG x 4 2004   SVG-LAD, SVG-D1, SVG-OM1, SVG-RPDA. (Native LIMA was 100% occluded)    Past Surgical History:  Procedure Laterality Date  . BREAST LUMPECTOMY WITH RADIOACTIVE SEED LOCALIZATION Left 01/29/2016   Procedure: LEFT BREAST LUMPECTOMY WITH RADIOACTIVE SEED LOCALIZATION;  Surgeon: [name], MD;  Location: [facility];  Service: General;  Laterality: Left;  . BREAST SURGERY Bilateral    cyst removal  . CARDIAC CATHETERIZATION  05/22/2009   all grafts patent ,LV systolic fx normal,small -vessel diabetic disease in native vessels   . CHOLECYSTECTOMY  02/05/2005  . CORONARY ARTERY BYPASS GRAFT  07/21/2002   in [place] ; SVG-LAD, SVG-D1, SVG-OM1, SVG RPDA.  . DOPPLER ECHOCARDIOGRAPHY  02/07/2008; April 2015   EF=>55%; LV normal; mild anterior MVP with mild MR.; b) 07/2013: EF 55-60%. Normal wall motion. Gr 1 DD, mild MR  with late systolic prolapse. Normal central venous and pulmonary arterial pressures   . NM MYOCAR PERF WALL MOTION  04/2009   EF 76%; mild ischeia basal inferolateral,mid inferoinlateral regions(s), abn pharmacologic nuc test, deffect indicte new ischemia,LV syst.fx normal --> cath showing no obstructive targets.  Marland Kitchen NM MYOVIEW LTD  June 2016   Normal perfusion. Normal study. LOW RISK. Normal EF (55%).  Compared to prior study, no ischemic changes present.    Current Meds  Medication Sig  . alendronate (FOSAMAX) 70 MG tablet Take 70 mg by mouth once a week. Take with a full glass of water on an empty stomach.  Marland Kitchen amLODipine (NORVASC) 10 MG tablet TAKE 1 TABLET BY MOUTH EVERY DAY  . aspirin 81 MG tablet Take 81 mg by mouth daily.  . carvedilol (COREG) 3.125 MG tablet TAKE 1 TABLET BY MOUTH TWICE A DAY  . cholecalciferol (VITAMIN D) 1000 UNITS tablet Take 1,000 Units by mouth daily.  . cyanocobalamin 1000 MCG tablet Take 100 mcg by mouth daily.  . diclofenac sodium (VOLTAREN) 1 % GEL APPLY TO AFFECTED AREA 4 TIMES A DAY AS NEEDED  . emollient (BIAFINE) cream Apply topically 2 (two) times daily.  Marland Kitchen escitalopram (LEXAPRO) 10 MG tablet Take 10 mg by mouth daily.  . hyaluronate sodium (RADIAPLEXRX) GEL Apply 1 application topically 2 (two) times daily.  Marland Kitchen ipratropium (ATROVENT) 0.06 % nasal spray Place 1 spray into the nose daily.  . irbesartan (AVAPRO) 300 MG tablet Take 1 tablet (300 mg total) by mouth daily.  Marland Kitchen latanoprost (XALATAN) 0.005 % ophthalmic solution Place 1 drop into both eyes at bedtime.  Marland Kitchen letrozole (FEMARA) 2.5 MG tablet Take 1 tablet (2.5 mg total) by mouth daily.  . nitroGLYCERIN (NITROSTAT) 0.4 MG SL tablet Place 1 tablet (0.4 mg total) under the tongue every 5 (five) minutes as needed for chest pain.  . non-metallic deodorant Jethro Poling) MISC Apply 1 application topically daily as needed.  . simvastatin (ZOCOR) 20 MG tablet Take 10 mg by mouth daily.    No Known Allergies  Social History   Social History  . Marital status: Married    Spouse name: N/A  . Number of children: 2  . Years of education: N/A   Social History Main Topics  . Smoking status: Never Smoker  . Smokeless tobacco: Never Used  . Alcohol use No  . Drug use: No  . Sexual activity: Yes    Birth control/ protection: Post-menopausal   Other Topics Concern  . None   Social History Narrative   She is  married with 2 children. Does not smoke or drink alcohol. She does routine exercise roughly 3-4 days a week where she follows a   The video. Weights stomach exercises jumping jacks and other aerobic type exercises.    family history includes Breast cancer in her sister and sister.  Wt Readings from Last 3 Encounters:  05/22/16 68.9 kg (152 lb)  05/07/16 69.9 kg (154 lb)  04/28/16 68.5 kg (151 lb)    PHYSICAL EXAM BP 134/65   Pulse (!) 59   Ht 5\' 4"  (1.626 m)   Wt 68.9 kg (152 lb)   BMI 26.09 kg/m  General appearance: alert, cooperative, appears stated age, no distress and Healthy-appearing.  Psych: Normal mood and affect. Answers questions appropriately.  Neck: no adenopathy, no carotid bruit, no JVD, supple, symmetrical, trachea midline and thyroid not enlarged, symmetric, no tenderness/mass/nodules  Lungs: CTAB, normal percussion bilaterally and Nonlabored, good air movement.  Heart: regular rate and rhythm, S1&S2 normal, no rub or gallop and normal apical impulse; soft systolic click with minimal HSM at apex.  Abdomen: soft, non-tender; bowel sounds normal; no masses, no organomegaly  Extremities: extremities normal, atraumatic, no cyanosis or edema  Pulses: 2+ and symmetric  Skin: Skin color, texture, turgor normal. No rashes or lesions  Neurologic: Alert and oriented X 3, normal strength and tone. Normal symmetric reflexes. Normal coordination and gait    Adult ECG Report  Rate: 59 ;  Rhythm: normal sinus rhythm and premature atrial contractions (PAC); nonspecific ST and T-T-wave abnormality. Otherwise normal.  Narrative Interpretation: Stable EKG   Other studies Reviewed: Additional studies/ records that were reviewed today include:  Recent Labs:   Lab Results  Component Value Date   CREATININE 0.9 01/01/2016   BUN 12.0 01/01/2016   NA 142 01/01/2016   K 4.0 01/01/2016   CL 110 07/31/2011   CO2 20 (L) 01/01/2016   No results found for: CHOL, HDL, LDLCALC,  LDLDIRECT, TRIG, CHOLHDL   ASSESSMENT / PLAN: Problem List Items Addressed This Visit    Coronary artery disease involving native heart without angina pectoris - Primary (Chronic)    Almost 3 years post CABG. Follow-up Myoview was negative for ischemia. Otherwise seems to doing fine from a cardiac standpoint. She is actually recovering from her breast cancer treatment as well. Starting to get energy back. Plan: Continue low-dose beta blocker along with ARB and calcium channel blocker. With resting heart rate of 59 bpm, cannot further titrate beta blocker.  She is on a statin and aspirin without additional antiplatelet therapy.      Relevant Orders   EKG 12-Lead   Essential hypertension (Chronic)    Well-controlled today. Continue to monitor.      Relevant Orders   EKG 12-Lead   Mitral valve anterior leaflet prolapse (Chronic)    Not really noted on follow-up echo. Nothing significant on exam either.      Relevant Orders   EKG 12-Lead   Hyperlipidemia LDL goal <70 (Chronic)    On statin. I have not seen in follow-up labs for her. My understanding is of there being followed by PCP.         Current medicines are reviewed at length with the patient today. (+/- concerns) n/a The following changes have been made: n/a  Patient Instructions  No change with current treatment.   Your physician wants you to follow-up in 12 months with Dr Ellyn Hack.  You will receive a reminder letter in the mail two months in advance. If you don't receive a letter, please call our office to schedule the follow-up appointment.   If you need a refill on your cardiac medications before your next appointment, please call your pharmacy.    Studies Ordered:   Orders Placed This Encounter  Procedures  . EKG 12-Lead      Glenetta Hew, M.D., M.S. Interventional Cardiologist   Pager # [phone] Phone # [phone] [address] [place], [place] [address]

## 2016-05-22 NOTE — Patient Instructions (Signed)
No change with current treatment.   Your physician wants you to follow-up in 12 months with Dr Ellyn Hack.  You will receive a reminder letter in the mail two months in advance. If you don't receive a letter, please call our office to schedule the follow-up appointment.   If you need a refill on your cardiac medications before your next appointment, please call your pharmacy.

## 2016-05-24 ENCOUNTER — Encounter: Payer: Self-pay | Admitting: Cardiology

## 2016-05-24 NOTE — Assessment & Plan Note (Signed)
Not really noted on follow-up echo. Nothing significant on exam either.

## 2016-05-24 NOTE — Assessment & Plan Note (Signed)
On statin. I have not seen in follow-up labs for her. My understanding is of there being followed by PCP.

## 2016-05-24 NOTE — Assessment & Plan Note (Signed)
Almost 3 years post CABG. Follow-up Myoview was negative for ischemia. Otherwise seems to doing fine from a cardiac standpoint. She is actually recovering from her breast cancer treatment as well. Starting to get energy back. Plan: Continue low-dose beta blocker along with ARB and calcium channel blocker. With resting heart rate of 59 bpm, cannot further titrate beta blocker.  She is on a statin and aspirin without additional antiplatelet therapy.

## 2016-05-24 NOTE — Assessment & Plan Note (Signed)
Well-controlled today. Continue to monitor.

## 2016-05-26 ENCOUNTER — Telehealth: Payer: Self-pay | Admitting: Hematology and Oncology

## 2016-05-26 NOTE — Telephone Encounter (Signed)
[facility] appt in June 2018. appt letter mailed to pt 1/30

## 2016-06-03 ENCOUNTER — Telehealth: Payer: Self-pay | Admitting: Emergency Medicine

## 2016-06-03 NOTE — Telephone Encounter (Signed)
Patient called with questions and concerns regarding a letter and calendar that they received in the mail about her appointment on 10/22/16 for suviviorship. Discussed with patient what this appointment is for and advised that they can discuss this in more detail when they see Dr Lindi Adie. Patient verbalized understanding.

## 2016-06-05 DIAGNOSIS — J310 Chronic rhinitis: Secondary | ICD-10-CM | POA: Insufficient documentation

## 2016-06-10 ENCOUNTER — Encounter: Payer: Self-pay | Admitting: Oncology

## 2016-06-11 ENCOUNTER — Ambulatory Visit
Admission: RE | Admit: 2016-06-11 | Discharge: 2016-06-11 | Disposition: A | Discharge status: Home / Self Care | Payer: Medicare Other | Source: Ambulatory Visit | Attending: Radiation Oncology | Admitting: Radiation Oncology

## 2016-06-11 ENCOUNTER — Encounter: Payer: Self-pay | Admitting: Radiation Oncology

## 2016-06-11 DIAGNOSIS — C50412 Malignant neoplasm of upper-outer quadrant of left female breast: Secondary | ICD-10-CM | POA: Diagnosis not present

## 2016-06-11 DIAGNOSIS — Z170 Estrogen receptor positive status [ER+]: Secondary | ICD-10-CM | POA: Diagnosis present

## 2016-06-11 DIAGNOSIS — Z7982 Long term (current) use of aspirin: Secondary | ICD-10-CM | POA: Insufficient documentation

## 2016-06-11 NOTE — Progress Notes (Signed)
Radiation Oncology         [phone] ________________________________  Name: Olivia Gray MRN: [id number]  Date: 06/11/2016  DOB: 1933-04-01    Follow-Up Visit Note  CC: Olivia Sheller, MD  Olivia Bookbinder, MD    ICD-9-CM ICD-10-CM   1. Malignant neoplasm of upper-outer quadrant of left breast in female, estrogen receptor positive ([place]) 174.4 C50.412    V86.0 Z17.0     Diagnosis:   Malignant neoplasm of upper-outer quadrant of left breast in female, estrogen receptor positive ([place])  Interval Since Last Radiation:  1 months ;  50.4 Gy in 28 fractions to the Left breast / axilla with a 10 Gy in 5 fraction Left breast boost 03/11/16-04/29/16  Narrative:  The patient returns today for routine follow-up.  She denies having pain. She reports having fatigue in the mornings. She is take Femara. Nursing notes, the skin on her left breast has hyperpigmentation. She denies swelling in her left hand or arm. She reports mild intermittent pain in her left breast.                              ALLERGIES:  has No Known Allergies.  Meds: Current Outpatient Prescriptions  Medication Sig Dispense Refill  . amLODipine (NORVASC) 10 MG tablet TAKE 1 TABLET BY MOUTH EVERY DAY 30 tablet 1  . aspirin 81 MG tablet Take 81 mg by mouth daily.    . carvedilol (COREG) 3.125 MG tablet TAKE 1 TABLET BY MOUTH TWICE A DAY 60 tablet 11  . cholecalciferol (VITAMIN D) 1000 UNITS tablet Take 1,000 Units by mouth daily.    . cyanocobalamin 1000 MCG tablet Take 100 mcg by mouth daily.    . diclofenac sodium (VOLTAREN) 1 % GEL APPLY TO AFFECTED AREA 4 TIMES A DAY AS NEEDED  5  . emollient (BIAFINE) cream Apply topically 2 (two) times daily.    Marland Kitchen escitalopram (LEXAPRO) 10 MG tablet Take 10 mg by mouth daily.    Marland Kitchen ipratropium (ATROVENT) 0.06 % nasal spray Place 1 spray into the nose daily.    . irbesartan (AVAPRO) 300 MG tablet Take 1 tablet (300 mg total) by mouth daily. 30 tablet 11  . latanoprost (XALATAN)  0.005 % ophthalmic solution Place 1 drop into both eyes at bedtime.    Marland Kitchen letrozole (FEMARA) 2.5 MG tablet Take 1 tablet (2.5 mg total) by mouth daily. 90 tablet 3  . nitroGLYCERIN (NITROSTAT) 0.4 MG SL tablet Place 1 tablet (0.4 mg total) under the tongue every 5 (five) minutes as needed for chest pain. 25 tablet 6  . simvastatin (ZOCOR) 20 MG tablet Take 10 mg by mouth daily.    Marland Kitchen alendronate (FOSAMAX) 70 MG tablet Take 70 mg by mouth once a week. Take with a full glass of water on an empty stomach.    . hyaluronate sodium (RADIAPLEXRX) GEL Apply 1 application topically 2 (two) times daily.    . non-metallic deodorant Jethro Poling) MISC Apply 1 application topically daily as needed.     No current facility-administered medications for this encounter.     Physical Findings: The patient is in no acute distress. Patient is alert and oriented.  height is 5\' 4"  (1.626 m) and weight is 152 lb (68.9 kg). Her oral temperature is 98.3 F (36.8 C). Her blood pressure is 137/64 and her pulse is 92. Her oxygen saturation is 98%. .  No significant changes. Lungs are clear to auscultation bilaterally. Heart  has regular rate and rhythm. No palpable cervical, supraclavicular, or axillary adenopathy. Abdomen soft, non-tender, normal bowel sounds. Hyperpigmentation changes in the left breast with no skin breakdown. Skin is well healed.  Lab Findings: Lab Results  Component Value Date   WBC 4.2 01/01/2016   HGB 13.3 01/01/2016   HCT 40.4 01/01/2016   MCV 89.9 01/01/2016   PLT 184 01/01/2016    Radiographic Findings: No results found.  Impression:  The patient is recovering from the effects of radiation. No evidence of recurrence on clinical exam.  Plan:  She will return for follow up in 6 months.  -----------------------------------  Blair Promise, PhD, MD  This document serves as a record of services personally performed by Gery Pray, MD. It was created on his behalf by Arlyce Harman, a trained  medical scribe. The creation of this record is based on the scribe's personal observations and the provider's statements to them. This document has been checked and approved by the attending provider.

## 2016-06-11 NOTE — Progress Notes (Signed)
Olivia Gray is here for follow up after treatment to her left breast.  She denies having pain.  She reports having fatigue in the mornings.  She is taking Femara.  The skin on her left breast has hyperpigmentation.  BP 137/64 (BP Location: Right Arm, Patient Position: Sitting)   Pulse 92   Temp 98.3 F (36.8 C) (Oral)   Ht 5\' 4"  (1.626 m)   Wt 152 lb (68.9 kg)   SpO2 98%   BMI 26.09 kg/m    Wt Readings from Last 3 Encounters:  06/11/16 152 lb (68.9 kg)  05/22/16 152 lb (68.9 kg)  05/07/16 154 lb (69.9 kg)

## 2016-06-15 ENCOUNTER — Emergency Department (HOSPITAL_COMMUNITY)
Admission: EM | Admit: 2016-06-15 | Discharge: 2016-06-15 | Disposition: A | Discharge status: Home / Self Care | Payer: Medicare Other | Attending: Emergency Medicine | Admitting: Emergency Medicine

## 2016-06-15 ENCOUNTER — Ambulatory Visit (HOSPITAL_COMMUNITY)
Admission: EM | Admit: 2016-06-15 | Discharge: 2016-06-15 | Disposition: A | Discharge status: Home / Self Care | Payer: Medicare Other

## 2016-06-15 ENCOUNTER — Emergency Department (HOSPITAL_COMMUNITY): Payer: Medicare Other

## 2016-06-15 ENCOUNTER — Encounter (HOSPITAL_COMMUNITY): Payer: Self-pay | Admitting: *Deleted

## 2016-06-15 DIAGNOSIS — Z955 Presence of coronary angioplasty implant and graft: Secondary | ICD-10-CM | POA: Insufficient documentation

## 2016-06-15 DIAGNOSIS — Z7982 Long term (current) use of aspirin: Secondary | ICD-10-CM | POA: Diagnosis not present

## 2016-06-15 DIAGNOSIS — Z853 Personal history of malignant neoplasm of breast: Secondary | ICD-10-CM | POA: Insufficient documentation

## 2016-06-15 DIAGNOSIS — I10 Essential (primary) hypertension: Secondary | ICD-10-CM | POA: Insufficient documentation

## 2016-06-15 DIAGNOSIS — Z951 Presence of aortocoronary bypass graft: Secondary | ICD-10-CM | POA: Diagnosis not present

## 2016-06-15 DIAGNOSIS — S2241XA Multiple fractures of ribs, right side, initial encounter for closed fracture: Secondary | ICD-10-CM | POA: Diagnosis not present

## 2016-06-15 DIAGNOSIS — Y92009 Unspecified place in unspecified non-institutional (private) residence as the place of occurrence of the external cause: Secondary | ICD-10-CM | POA: Insufficient documentation

## 2016-06-15 DIAGNOSIS — Y9302 Activity, running: Secondary | ICD-10-CM | POA: Insufficient documentation

## 2016-06-15 DIAGNOSIS — Y999 Unspecified external cause status: Secondary | ICD-10-CM | POA: Insufficient documentation

## 2016-06-15 DIAGNOSIS — S299XXA Unspecified injury of thorax, initial encounter: Secondary | ICD-10-CM | POA: Diagnosis present

## 2016-06-15 DIAGNOSIS — W19XXXA Unspecified fall, initial encounter: Secondary | ICD-10-CM

## 2016-06-15 DIAGNOSIS — W1789XA Other fall from one level to another, initial encounter: Secondary | ICD-10-CM | POA: Insufficient documentation

## 2016-06-15 DIAGNOSIS — I2510 Atherosclerotic heart disease of native coronary artery without angina pectoris: Secondary | ICD-10-CM | POA: Insufficient documentation

## 2016-06-15 DIAGNOSIS — Z79899 Other long term (current) drug therapy: Secondary | ICD-10-CM | POA: Insufficient documentation

## 2016-06-15 HISTORY — DX: Legal blindness, as defined in USA: H54.8

## 2016-06-15 LAB — I-STAT TROPONIN, ED: Troponin i, poc: 0 ng/mL (ref 0.00–0.08)

## 2016-06-15 MED ORDER — OXYCODONE-ACETAMINOPHEN 5-325 MG PO TABS: 1.0000 | ORAL_TABLET | Freq: Four times a day (QID) | ORAL | 0 refills | Status: AC | PRN

## 2016-06-15 MED ORDER — OXYCODONE-ACETAMINOPHEN 5-325 MG PO TABS
1.0000 | ORAL_TABLET | Freq: Once | ORAL | Status: AC
  Administered 2016-06-15: 1 via ORAL
  Filled 2016-06-15: qty 1

## 2016-06-15 NOTE — ED Triage Notes (Signed)
Pt states that on Sun she fell out of bed while dreaming that she was running.  She landed on her R side and states R rib pain.  Bruise to R lat abdomen, although pt denies pain to that site.

## 2016-06-15 NOTE — ED Provider Notes (Signed)
[facility] DEPT Provider Note   CSN: [id number] Arrival date & time: 06/15/16  1827     History   Chief Complaint Chief Complaint  Patient presents with  . Fall  . Chest Pain    rib pain    HPI Olivia Gray is a 83 y.o. female. Patient is an 83 year old female with history as below who presents with right-sided chest wall pain. Patient reports she fell out of her bed last night onto her right side. Height of the fall was approximately 3 feet. She reports having a vivid dream that she was running and then somehow fell out of the bed. Her pain is been getting progressively worse since that time. Her pain is isolated to a pinpoint area on her right chest wall. She denies any shortness of breath, but it is painful to take a deep breath. She denies hitting her head, losing consciousness, or any other complaints at this time.  HPI  Past Medical History:  Diagnosis Date  . Breast cancer of upper-outer quadrant of left female breast ([place]) 12/27/2015  . CAD, multiple vessel 2004   Myoview 2011: Mild Basal-mid Inferolateral "ischemia", EF ~76?% -- Anormal, but LOW RISK  . Essential hypertension   . History of radiation therapy 03/11/16-04/29/16   left breast/axilla 50.4 Gy in 28 fractions, boost of 10 Gy in 5 fractions  . Hyperlipidemia LDL goal <70    On statin.  . Legally blind   . Mitral valve prolapse 02/07/2008; April 2015   a) Mild Anterior MVP, with mild MR.;; b) Echo 07/2013: EF 55-60%, Gr 1 DD, no WMA, Mild central MR with late systolic prolapse   . Pre-diabetes   . Retinitis pigmentosa of both eyes     now essentially legally blind.  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 S/P CABG x 4 2004   SVG-LAD, SVG-D1, SVG-OM1, SVG-RPDA. (Native LIMA was 100% occluded)    Patient Active Problem List   Diagnosis Date Noted  . Osteoporosis 05/07/2016  . Breast cancer of  upper-outer quadrant of left female breast ([place]) 12/27/2015  . Musculoskeletal chest pain 09/10/2014  . S/P CABG x 4   . Essential hypertension     Class: Chronic  . Hyperlipidemia LDL goal <70   . Mitral valve anterior leaflet prolapse   . Coronary artery disease involving native heart without angina pectoris 05/14/2002    Past Surgical History:  Procedure Laterality Date  . BREAST LUMPECTOMY WITH RADIOACTIVE SEED LOCALIZATION Left 01/29/2016   Procedure: LEFT BREAST LUMPECTOMY WITH RADIOACTIVE SEED LOCALIZATION;  Surgeon: [name], MD;  Location: [facility];  Service: General;  Laterality: Left;  . BREAST SURGERY Bilateral    cyst removal  . CARDIAC CATHETERIZATION  05/22/2009   all grafts patent ,LV systolic fx normal,small -vessel diabetic disease in native vessels   . CHOLECYSTECTOMY  02/05/2005  . CORONARY ARTERY BYPASS GRAFT  07/21/2002   in [place] ; SVG-LAD, SVG-D1, SVG-OM1, SVG RPDA.  . DOPPLER ECHOCARDIOGRAPHY  02/07/2008; April 2015   EF=>55%; LV normal; mild anterior MVP with mild MR.; b) 07/2013: EF 55-60%. Normal wall motion. Gr 1 DD, mild MR with late systolic prolapse. Normal central venous and pulmonary arterial pressures   . NM MYOCAR PERF WALL MOTION  04/2009   EF 76%; mild ischeia basal inferolateral,mid inferoinlateral regions(s), abn pharmacologic nuc test, deffect indicte new ischemia,LV syst.fx  normal --> cath showing no obstructive targets.  Marland Kitchen NM MYOVIEW LTD  June 2016   Normal perfusion. Normal study. LOW RISK. Normal EF (55%). Compared to prior study, no ischemic changes present.    OB History    No data available       Home Medications    Prior to Admission medications   Medication Sig Start Date End Date Taking? Authorizing Provider  alendronate (FOSAMAX) 70 MG tablet Take 70 mg by mouth once a week. Take with a full glass of water on an empty stomach.   Yes Historical Provider, MD  amLODipine (NORVASC) 10 MG tablet TAKE 1  TABLET BY MOUTH EVERY DAY 05/04/16  Yes Leonie Man, MD  aspirin 81 MG tablet Take 81 mg by mouth daily.   Yes Historical Provider, MD  carvedilol (COREG) 3.125 MG tablet TAKE 1 TABLET BY MOUTH TWICE A DAY 12/27/15  Yes Leonie Man, MD  cholecalciferol (VITAMIN D) 1000 UNITS tablet Take 1,000 Units by mouth daily.   Yes Historical Provider, MD  cyanocobalamin 1000 MCG tablet Take 100 mcg by mouth daily.   Yes Historical Provider, MD  diclofenac sodium (VOLTAREN) 1 % GEL APPLY TO AFFECTED AREA 4 TIMES A DAY AS NEEDED 12/10/15  Yes Historical Provider, MD  emollient (BIAFINE) cream Apply topically 2 (two) times daily.   Yes Historical Provider, MD  escitalopram (LEXAPRO) 10 MG tablet Take 10 mg by mouth daily.   Yes Historical Provider, MD  ipratropium (ATROVENT) 0.06 % nasal spray Place 1 spray into the nose daily. 06/26/13  Yes Historical Provider, MD  irbesartan (AVAPRO) 300 MG tablet Take 1 tablet (300 mg total) by mouth daily. 01/10/16  Yes Leonie Man, MD  latanoprost (XALATAN) 0.005 % ophthalmic solution Place 1 drop into both eyes at bedtime. 08/12/15  Yes Historical Provider, MD  letrozole (FEMARA) 2.5 MG tablet Take 1 tablet (2.5 mg total) by mouth daily. 05/07/16  Yes Nicholas Lose, MD  nitroGLYCERIN (NITROSTAT) 0.4 MG SL tablet Place 1 tablet (0.4 mg total) under the tongue every 5 (five) minutes as needed for chest pain. 02/11/15  Yes Leonie Man, MD  non-metallic deodorant Jethro Poling) MISC Apply 1 application topically daily as needed.   Yes Historical Provider, MD  simvastatin (ZOCOR) 20 MG tablet Take 10 mg by mouth daily.   Yes Historical Provider, MD  oxyCODONE-acetaminophen (PERCOCET/ROXICET) 5-325 MG tablet Take 1 tablet by mouth every 6 (six) hours as needed for moderate pain or severe pain. 06/15/16 06/18/16  Clifton James, MD    Family History Family History  Problem Relation Age of Onset  . Breast cancer Sister   . Breast cancer Sister     Social History Social  History  Substance Use Topics  . Smoking status: Never Smoker  . Smokeless tobacco: Never Used  . Alcohol use No     Allergies   Patient has no known allergies.   Review of Systems Review of Systems  Constitutional: Negative for chills and fever.  HENT: Negative for ear pain and sore throat.   Eyes: Negative for pain and visual disturbance.  Respiratory: Negative for cough and shortness of breath.   Cardiovascular: Positive for chest pain. Negative for palpitations.  Gastrointestinal: Negative for abdominal pain and vomiting.  Genitourinary: Negative for dysuria and hematuria.  Musculoskeletal: Negative for arthralgias and back pain.  Skin: Negative for color change and rash.  Neurological: Negative for seizures and syncope.  All other systems reviewed and are negative.  Physical Exam Updated Vital Signs BP 185/74   Pulse 65   Temp 98.5 F (36.9 C) (Oral)   Resp 16   Ht 5\' 4"  (1.626 m)   Wt 67.6 kg   SpO2 99%   BMI 25.58 kg/m   Physical Exam  Constitutional: She is oriented to person, place, and time. She appears well-developed and well-nourished. No distress.  HENT:  Head: Normocephalic and atraumatic.  Eyes: Conjunctivae are normal.  Neck: Neck supple.  Cardiovascular: Normal rate and regular rhythm.   No murmur heard. Pulmonary/Chest: Effort normal and breath sounds normal. No respiratory distress. She has no wheezes. She has no rales. She exhibits tenderness (Tender to right side of chest wall in mid-axillary line).  Abdominal: Soft. There is no tenderness.  Musculoskeletal: She exhibits no edema.  Neurological: She is alert and oriented to person, place, and time. Coordination normal.  Legally blind  Skin: Skin is warm and dry.  Psychiatric: She has a normal mood and affect.  Nursing note and vitals reviewed.    ED Treatments / Results  Labs (all labs ordered are listed, but only abnormal results are displayed) Labs Reviewed  Randolm Idol, ED     EKG  EKG Interpretation None       Radiology Dg Ribs Unilateral W/chest Right  Result Date: 06/15/2016 CLINICAL DATA:  83 y/o F; status post fall on right side with right-sided rib pain. EXAM: RIGHT RIBS AND CHEST - 3+ VIEW COMPARISON:  07/31/2011 chest radiograph. FINDINGS: Low lung volumes accentuate pulmonary markings. Linear opacities in lung bases compatible with atelectasis. No pleural effusion. No pneumothorax. Post median sternotomy with wires in alignment. Cholecystectomy clips project over right upper quadrant. Right eighth and ninth anterolateral mildly displaced rib fractures. IMPRESSION: Right eighth and ninth anterolateral mildly displaced acute rib fractures. Electronically Signed   By: Kristine Garbe M.D.   On: 06/15/2016 19:31    Procedures Procedures (including critical care time)  Medications Ordered in ED Medications  oxyCODONE-acetaminophen (PERCOCET/ROXICET) 5-325 MG per tablet 1 tablet (1 tablet Oral Given 06/15/16 2135)     Initial Impression / Assessment and Plan / ED Course  I have reviewed the triage vital signs and the nursing notes.  Pertinent labs & imaging results that were available during my care of the patient were reviewed by me and considered in my medical decision making (see chart for details).    Pt is a very pleasant 83 yo female with hx as above who presents 1 day after she fell out of her bed onto the floor, landing on her right side. Initially had mild right sided chest pain but it has worsened today. Denies hitting head or any other injury. XR revealed R 8-9 rib fractures. Trop negative. Pain is well-controlled here with Percocet. I had extensive discussion with she and her husband about admission vs. Discharge and pt really wants to go home. She was given ICS and instructed on use. Pulling 500cc here. I discussed return precautions in detail and plan is to f/u with PCP in 1-2 days and return here immediately if symptoms worsen or  if she has any trouble breathing. Pt and husband express agreement. Pt d/c in stable condition.  Final Clinical Impressions(s) / ED Diagnoses   Final diagnoses:  Closed fracture of multiple ribs of right side, initial encounter  Fall in home, initial encounter    New Prescriptions New Prescriptions   OXYCODONE-ACETAMINOPHEN (PERCOCET/ROXICET) 5-325 MG TABLET    Take 1 tablet by mouth every 6 (  six) hours as needed for moderate pain or severe pain.     Clifton James, MD 06/15/16 [place], MD 06/16/16 [phone]

## 2016-06-15 NOTE — ED Notes (Signed)
Introduced self in lobby.  Offered warm blankets and explanations for delays in rooming.

## 2016-07-10 ENCOUNTER — Ambulatory Visit (INDEPENDENT_AMBULATORY_CARE_PROVIDER_SITE_OTHER): Payer: Self-pay | Admitting: Family

## 2016-07-13 ENCOUNTER — Encounter (INDEPENDENT_AMBULATORY_CARE_PROVIDER_SITE_OTHER): Payer: Self-pay | Admitting: Orthopaedic Surgery

## 2016-07-13 ENCOUNTER — Ambulatory Visit (INDEPENDENT_AMBULATORY_CARE_PROVIDER_SITE_OTHER): Payer: Medicare Other | Admitting: Orthopaedic Surgery

## 2016-07-13 DIAGNOSIS — S43102A Unspecified dislocation of left acromioclavicular joint, initial encounter: Secondary | ICD-10-CM | POA: Insufficient documentation

## 2016-07-13 NOTE — Progress Notes (Signed)
Office Visit Note   Patient: Olivia Gray           Date of Birth: 1932-05-03           MRN: [id number] Visit Date: 07/13/2016              Requested by: [name], MD [address], [place], [place] [address] PCP: [name], MD   Assessment & Plan: Visit Diagnoses:  1. Separation of left acromioclavicular joint, type 5, initial encounter     Plan: Recommend continue symptomatic treatment. Patient is doing well with her function. I would expect this to continue to improve. Follow up with me as needed. Questions encouraged and answered.  Follow-Up Instructions: Return if symptoms worsen or fail to improve.   Orders:  No orders of the defined types were placed in this encounter.  No orders of the defined types were placed in this encounter.     Procedures: No procedures performed   Clinical Data: No additional findings.   Subjective: Chief Complaint  Patient presents with  . Left Shoulder - Pain    Patient is a very pleasant 83 year old female who is legally blind comes in with acute on chronic left acromioclavicular separation from a fall recently. She initially had significant pain but now it's much better and it's only 1 out of 10 pain. She does have some crepitus with elevation of the arm. She has a chronic acromioclavicular separation that was treated nonoperatively. She has pain only when elevating her arm. Pain does not radiate.    Review of Systems  Constitutional: Negative.   HENT: Negative.   Eyes: Negative.   Respiratory: Negative.   Cardiovascular: Negative.   Endocrine: Negative.   Musculoskeletal: Negative.   Neurological: Negative.   Hematological: Negative.   Psychiatric/Behavioral: Negative.   All other systems reviewed and are negative.    Objective: Vital Signs: There were no vitals taken for this visit.  Physical Exam  Constitutional: She is oriented to person, place, and time. She appears well-developed and  well-nourished.  HENT:  Head: Normocephalic and atraumatic.  Eyes: EOM are normal.  Neck: Neck supple.  Pulmonary/Chest: Effort normal.  Abdominal: Soft.  Neurological: She is alert and oriented to person, place, and time.  Skin: Skin is warm. Capillary refill takes less than 2 seconds.  Psychiatric: She has a normal mood and affect. Her behavior is normal. Judgment and thought content normal.  Nursing note and vitals reviewed.   Ortho Exam Left shoulder exam shows a prominence of the distal clavicle. The skin is not tenting. She's neurovascularly intact. Her range of motion and function are essentially normal. She has a little bit of crepitus with movement of the shoulder at the acromioclavicular joint. Specialty Comments:  No specialty comments available.  Imaging: No results found.   PMFS History: Patient Active Problem List   Diagnosis Date Noted  . Separation of left acromioclavicular joint, type 5 07/13/2016  . Osteoporosis 05/07/2016  . Breast cancer of upper-outer quadrant of left female breast ([facility]) 12/27/2015  . Musculoskeletal chest pain 09/10/2014  . S/P CABG x 4   . Essential hypertension     Class: Chronic  . Hyperlipidemia LDL goal <70   . Mitral valve anterior leaflet prolapse   . Coronary artery disease involving native heart without angina pectoris 05/14/2002   Past Medical History:  Diagnosis Date  . Breast cancer of upper-outer quadrant of left female breast ([facility]) 12/27/2015  . CAD, multiple vessel 2004  Myoview 2011: Mild Basal-mid Inferolateral "ischemia", EF ~76?% -- Anormal, but LOW RISK  . Essential hypertension   . History of radiation therapy 03/11/16-04/29/16   left breast/axilla 50.4 Gy in 28 fractions, boost of 10 Gy in 5 fractions  . Hyperlipidemia LDL goal <70    On statin.  . Legally blind   . Mitral valve prolapse 02/07/2008; April 2015   a) Mild Anterior MVP, with mild MR.;; b) Echo 07/2013: EF 55-60%, Gr 1 DD, no WMA, Mild central MR  with late systolic prolapse   . Pre-diabetes   . Retinitis pigmentosa of both eyes     now essentially legally blind.  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 S/P CABG x 4 2004   SVG-LAD, SVG-D1, SVG-OM1, SVG-RPDA. (Native LIMA was 100% occluded)    Family History  Problem Relation Age of Onset  . Breast cancer Sister   . Breast cancer Sister     Past Surgical History:  Procedure Laterality Date  . BREAST LUMPECTOMY WITH RADIOACTIVE SEED LOCALIZATION Left 01/29/2016   Procedure: LEFT BREAST LUMPECTOMY WITH RADIOACTIVE SEED LOCALIZATION;  Surgeon: [name], MD;  Location: [facility];  Service: General;  Laterality: Left;  . BREAST SURGERY Bilateral    cyst removal  . CARDIAC CATHETERIZATION  05/22/2009   all grafts patent ,LV systolic fx normal,small -vessel diabetic disease in native vessels   . CHOLECYSTECTOMY  02/05/2005  . CORONARY ARTERY BYPASS GRAFT  07/21/2002   in [place] ; SVG-LAD, SVG-D1, SVG-OM1, SVG RPDA.  . DOPPLER ECHOCARDIOGRAPHY  02/07/2008; April 2015   EF=>55%; LV normal; mild anterior MVP with mild MR.; b) 07/2013: EF 55-60%. Normal wall motion. Gr 1 DD, mild MR with late systolic prolapse. Normal central venous and pulmonary arterial pressures   . NM MYOCAR PERF WALL MOTION  04/2009   EF 76%; mild ischeia basal inferolateral,mid inferoinlateral regions(s), abn pharmacologic nuc test, deffect indicte new ischemia,LV syst.fx normal --> cath showing no obstructive targets.  Marland Kitchen NM MYOVIEW LTD  June 2016   Normal perfusion. Normal study. LOW RISK. Normal EF (55%). Compared to prior study, no ischemic changes present.   Social History   Occupational History  . Not on file.   Social History Main Topics  . Smoking status: Never Smoker  . Smokeless tobacco: Never Used  . Alcohol use No  . Drug use: No  . Sexual activity: Yes     Birth control/ protection: Post-menopausal

## 2016-07-19 ENCOUNTER — Other Ambulatory Visit: Payer: Self-pay | Admitting: Cardiology

## 2016-07-20 ENCOUNTER — Telehealth: Payer: Self-pay | Admitting: Oncology

## 2016-07-20 NOTE — Telephone Encounter (Signed)
Patient called and said she noticed that her left breast is larger than her right about a week ago.  She denies having any pain in her left breast and said it is not warm to the touch.  She said it is a little red.  Advised her that we will call her back if we need to move up her follow up appointment.

## 2016-07-20 NOTE — Telephone Encounter (Signed)
Called Olivia Gray back to see if she can come for follow up on Thursday.  She said that is a day of worship and would like an appointment tomorrow if possible.  Scheduled appointment for 3 :30 pm on 07/21/16.

## 2016-07-21 ENCOUNTER — Ambulatory Visit
Admission: RE | Admit: 2016-07-21 | Discharge: 2016-07-21 | Disposition: A | Discharge status: Home / Self Care | Payer: Medicare Other | Source: Ambulatory Visit | Attending: Radiation Oncology | Admitting: Radiation Oncology

## 2016-07-21 ENCOUNTER — Encounter: Payer: Self-pay | Admitting: Radiation Oncology

## 2016-07-21 DIAGNOSIS — C50412 Malignant neoplasm of upper-outer quadrant of left female breast: Secondary | ICD-10-CM | POA: Insufficient documentation

## 2016-07-21 DIAGNOSIS — Z7982 Long term (current) use of aspirin: Secondary | ICD-10-CM | POA: Diagnosis not present

## 2016-07-21 DIAGNOSIS — Z170 Estrogen receptor positive status [ER+]: Secondary | ICD-10-CM | POA: Insufficient documentation

## 2016-07-21 DIAGNOSIS — Z79811 Long term (current) use of aromatase inhibitors: Secondary | ICD-10-CM | POA: Insufficient documentation

## 2016-07-21 NOTE — Progress Notes (Signed)
Radiation Oncology         [phone] ________________________________  Name: Olivia Gray MRN: [id number]  Date: 07/21/2016  DOB: 18-Oct-1932     Follow-Up Visit Note  CC: [name], MD  [name], MD    ICD-9-CM ICD-10-CM   1. Malignant neoplasm of upper-outer quadrant of left breast in female, estrogen receptor positive ([facility]) 174.4 C50.412    V86.0 Z17.0     Diagnosis: Stage IIA (pT1b, pN1a) grade 1 invasive ductal carcinoma of the left breast (ER+,PR+,HER2-)  Interval Since Last Radiation: 3 months   03/11/16-04/29/16: 50.4 Gy in 28 fractions to the Left breast / axilla with a 10 Gy in 5 fraction Left breast boost   Narrative:  The patient returns today for routine follow-up. She is with her husband. She reports a 1 week history swelling of the left arm and left breast and this worried her and made her present today. She reports that she fell out of bed about a month ago and broke 2 ribs (Right 8-9 rib fractures).  She said she is not having very much pain now and takes Advil as needed.  She is currently taking Femara.  The skin on her left breast has hyperpigmentation and appears slightly swollen.  She continues to use biafine. The patient is on Letrozole. For bone health, Dr. Lindi Adie started the patient with 8 pills of Fosamax and then she will have Prolia every 6 months.  ALLERGIES:  has No Known Allergies.  Meds: Current Outpatient Prescriptions  Medication Sig Dispense Refill  . alendronate (FOSAMAX) 70 MG tablet Take 70 mg by mouth once a week. Take with a full glass of water on an empty stomach.    Marland Kitchen amLODipine (NORVASC) 10 MG tablet TAKE 1 TABLET BY MOUTH EVERY DAY 30 tablet 9  . aspirin 81 MG tablet Take 81 mg by mouth daily.    . carvedilol (COREG) 3.125 MG tablet TAKE 1 TABLET BY MOUTH TWICE A DAY 60 tablet 11  . cholecalciferol (VITAMIN D) 1000 UNITS tablet Take 1,000 Units by mouth daily.    . cyanocobalamin 1000 MCG tablet Take 100 mcg by mouth  daily.    . diclofenac sodium (VOLTAREN) 1 % GEL APPLY TO AFFECTED AREA 4 TIMES A DAY AS NEEDED  5  . diphenhydrAMINE (BENADRYL) 25 MG tablet Take 25 mg by mouth every 6 (six) hours as needed.    Marland Kitchen escitalopram (LEXAPRO) 10 MG tablet Take 10 mg by mouth daily.    Marland Kitchen ibuprofen (ADVIL,MOTRIN) 100 MG/5ML suspension Take 200 mg by mouth every 4 (four) hours as needed.    Marland Kitchen ipratropium (ATROVENT) 0.06 % nasal spray Place 1 spray into the nose daily.    . irbesartan (AVAPRO) 300 MG tablet Take 1 tablet (300 mg total) by mouth daily. 30 tablet 11  . latanoprost (XALATAN) 0.005 % ophthalmic solution Place 1 drop into both eyes at bedtime.    Marland Kitchen letrozole (FEMARA) 2.5 MG tablet Take 1 tablet (2.5 mg total) by mouth daily. 90 tablet 3  . nitroGLYCERIN (NITROSTAT) 0.4 MG SL tablet Place 1 tablet (0.4 mg total) under the tongue every 5 (five) minutes as needed for chest pain. 25 tablet 6  . non-metallic deodorant (ALRA) MISC Apply 1 application topically daily as needed.    . simvastatin (ZOCOR) 20 MG tablet Take 10 mg by mouth daily.    Marland Kitchen emollient (BIAFINE) cream Apply topically 2 (two) times daily.     No current facility-administered medications for this encounter.  Physical Findings: The patient is in no acute distress. Patient is alert and oriented.  height is _0  (1.626 m) and weight is 147 lb (66.7 kg). Her oral temperature is 97.9 F (36.6 C). Her blood pressure is 143/60 (abnormal) and her pulse is 57 (abnormal). Her oxygen saturation is 100%.   Lungs are clear to auscultation bilaterally. Heart has regular rate and rhythm. No palpable cervical, supraclavicular, or axillary adenopathy. Right breast no mass, nipple discharge/bleeding. Left breast swelling and hyperpigmentation changes, no sign of infection, no nipple discharge or bleeding. No palpable mass  Lab Findings: Lab Results  Component Value Date   WBC 4.2 01/01/2016   HGB 13.3 01/01/2016   HCT 40.4 01/01/2016   MCV 89.9  01/01/2016   PLT 184 01/01/2016    Radiographic Findings: No results found.  Impression:  The patient is recovering from the effects of radiation. No evidence of recurrence on clinical exam. Swelling of the left breast likely related to her radiation treatments. I gave her advice on exercise and massage therapy of the left arm and breast. She does not wish to proceed with physical therapy at this time.  Plan:  She will return for follow up in August 2018. She is scheduled to follow up with Dr. Lindi Adie on 08/03/16. -----------------------------------  Blair Promise, PhD, MD  This document serves as a record of services personally performed by Gery Pray, MD. It was created on his behalf by Darcus Austin, a trained medical scribe. The creation of this record is based on the scribe's personal observations and the provider's statements to them. This document has been checked and approved by the attending provider.

## 2016-07-21 NOTE — Progress Notes (Addendum)
Olivia Gray is here for follow up.  She reports that she fell out of bed about a month ago and broke 2 ribs.  She said she is not having very much pain now and takes Advil as needed.  She is currently taking Femara.  The skin on her left breast has hyperpigmentation and appears slightly swollen.  She continues to use biafine.  BP (!) 143/60 (BP Location: Right Arm, Patient Position: Sitting)   Pulse (!) 57   Temp 97.9 F (36.6 C) (Oral)   Ht 5\' 4"  (1.626 m)   Wt 147 lb (66.7 kg)   SpO2 100%   BMI 25.23 kg/m    Wt Readings from Last 3 Encounters:  07/21/16 147 lb (66.7 kg)  06/15/16 149 lb (67.6 kg)  06/11/16 152 lb (68.9 kg)

## 2016-07-31 ENCOUNTER — Other Ambulatory Visit: Payer: Self-pay | Admitting: Emergency Medicine

## 2016-07-31 DIAGNOSIS — C50412 Malignant neoplasm of upper-outer quadrant of left female breast: Secondary | ICD-10-CM

## 2016-07-31 DIAGNOSIS — Z170 Estrogen receptor positive status [ER+]: Principal | ICD-10-CM

## 2016-08-03 ENCOUNTER — Ambulatory Visit (HOSPITAL_BASED_OUTPATIENT_CLINIC_OR_DEPARTMENT_OTHER): Payer: Medicare Other | Admitting: Hematology and Oncology

## 2016-08-03 ENCOUNTER — Ambulatory Visit: Payer: Medicare Other

## 2016-08-03 ENCOUNTER — Encounter: Payer: Self-pay | Admitting: Hematology and Oncology

## 2016-08-03 ENCOUNTER — Other Ambulatory Visit (HOSPITAL_BASED_OUTPATIENT_CLINIC_OR_DEPARTMENT_OTHER): Payer: Medicare Other

## 2016-08-03 DIAGNOSIS — Z170 Estrogen receptor positive status [ER+]: Secondary | ICD-10-CM

## 2016-08-03 DIAGNOSIS — M810 Age-related osteoporosis without current pathological fracture: Secondary | ICD-10-CM

## 2016-08-03 DIAGNOSIS — C50412 Malignant neoplasm of upper-outer quadrant of left female breast: Secondary | ICD-10-CM

## 2016-08-03 LAB — CBC WITH DIFFERENTIAL/PLATELET
BASO%: 0.9 % (ref 0.0–2.0)
Basophils Absolute: 0 10*3/uL (ref 0.0–0.1)
EOS%: 2.3 % (ref 0.0–7.0)
Eosinophils Absolute: 0.1 10*3/uL (ref 0.0–0.5)
HCT: 35.8 % (ref 34.8–46.6)
HGB: 12.2 g/dL (ref 11.6–15.9)
LYMPH%: 23.6 % (ref 14.0–49.7)
MCH: 30.3 pg (ref 25.1–34.0)
MCHC: 34.1 g/dL (ref 31.5–36.0)
MCV: 88.8 fL (ref 79.5–101.0)
MONO#: 0.4 10*3/uL (ref 0.1–0.9)
MONO%: 10.6 % (ref 0.0–14.0)
NEUT#: 2.2 10*3/uL (ref 1.5–6.5)
NEUT%: 62.6 % (ref 38.4–76.8)
Platelets: 177 10*3/uL (ref 145–400)
RBC: 4.03 10*6/uL (ref 3.70–5.45)
RDW: 13 % (ref 11.2–14.5)
WBC: 3.5 10*3/uL — ABNORMAL LOW (ref 3.9–10.3)
lymph#: 0.8 10*3/uL — ABNORMAL LOW (ref 0.9–3.3)

## 2016-08-03 LAB — COMPREHENSIVE METABOLIC PANEL
ALT: 14 U/L (ref 0–55)
AST: 19 U/L (ref 5–34)
Albumin: 3.5 g/dL (ref 3.5–5.0)
Alkaline Phosphatase: 82 U/L (ref 40–150)
Anion Gap: 10 mEq/L (ref 3–11)
BUN: 11.9 mg/dL (ref 7.0–26.0)
CO2: 23 mEq/L (ref 22–29)
Calcium: 9.2 mg/dL (ref 8.4–10.4)
Chloride: 110 mEq/L — ABNORMAL HIGH (ref 98–109)
Creatinine: 1 mg/dL (ref 0.6–1.1)
EGFR: 59 mL/min/{1.73_m2} — ABNORMAL LOW (ref >90)
Glucose: 102 mg/dl (ref 70–140)
Potassium: 4 mEq/L (ref 3.5–5.1)
Sodium: 142 mEq/L (ref 136–145)
Total Bilirubin: 1.33 mg/dL — ABNORMAL HIGH (ref 0.20–1.20)
Total Protein: 7 g/dL (ref 6.4–8.3)

## 2016-08-03 MED ORDER — DENOSUMAB 60 MG/ML ~~LOC~~ SOLN: 60.0000 mg | Freq: Once | SUBCUTANEOUS | Status: DC

## 2016-08-03 NOTE — Assessment & Plan Note (Signed)
Left lumpectomy 01/29/2016: IDC with DCIS, 1 cm, margins negative, fibrocystic changes,1/2 lymph nodes positive ( intramammary nodes)T1bN1 stage II a  Adjuvant radiation therapy completed 04/29/16  Treatment Plan: adjuvant antiestrogen therapy with Letrozole 2.5 mg daily started 05/07/16 Osteoporosis: Starting Prolia today  Letrozole toxicities:  Return to clinic in 6 months for follow-up and Prolia

## 2016-08-03 NOTE — Progress Notes (Signed)
Patient Care Team: [name], MD as PCP - General (Internal Medicine) [name], MD as Consulting Physician (General Surgery) Nicholas Lose, MD as Consulting Physician (Hematology and Oncology) Gery Pray, MD as Consulting Physician (Radiation Oncology)  DIAGNOSIS:  Encounter Diagnosis  Name Primary?  . Malignant neoplasm of upper-outer quadrant of left breast in female, estrogen receptor positive ([place])     SUMMARY OF ONCOLOGIC HISTORY:   Breast cancer of upper-outer quadrant of left female breast ([place])   12/26/2015 Initial Diagnosis    Left breast biopsy 1:00: Invasive ductal carcinoma grade 1, DCIS,ER 95%, PR 95%, Ki-67 10%, HER-2 negative ratio 1.12; left breast mass 1:00 position 7 mm size axilla ultrasound negative, T1 BN 0 stage IA clinical stage      01/29/2016 Surgery    Left lumpectomy: IDC with DCIS, 1 cm, margins negative, fibrocystic changes,1/2 lymph nodes positive ( intramammary nodes)T1bN1 stage II a      03/11/2016 - 04/29/2016 Radiation Therapy    Adj XRT      05/07/2016 -  Anti-estrogen oral therapy    Letrozole 2.5 mg daily along with Prolia every 6 months       CHIEF COMPLIANT: Follow-up on letrozole therapy  INTERVAL HISTORY: Olivia Gray is a 83 year old with above-mentioned history of left breast cancer treated with lumpectomy and radiation and is currently on letrozole therapy since January 2018. She is tolerating letrozole extremely well. She denies any hot flashes or myalgias. Patient has vision impairment. She has forgotten to take Fosamax for the osteoporosis. She will now start to take those medications. Because of this we will holding back on Prolia for today.  REVIEW OF SYSTEMS:   Constitutional: Denies fevers, chills or abnormal weight loss Eyes: Denies blurriness of vision Ears, nose, mouth, throat, and face: Denies mucositis or sore throat, vision impairment Respiratory: Denies cough, dyspnea or wheezes Cardiovascular: Denies  palpitation, chest discomfort Gastrointestinal:  Denies nausea, heartburn or change in bowel habits Skin: Denies abnormal skin rashes Lymphatics: Denies new lymphadenopathy or easy bruising Neurological:Denies numbness, tingling or new weaknesses Behavioral/Psych: Mood is stable, no new changes  Extremities: No lower extremity edema Breast:  denies any pain or lumps or nodules in either breasts All other systems were reviewed with the patient and are negative.  I have reviewed the past medical history, past surgical history, social history and family history with the patient and they are unchanged from previous note.  ALLERGIES:  has No Known Allergies.  MEDICATIONS:  Current Outpatient Prescriptions  Medication Sig Dispense Refill  . alendronate (FOSAMAX) 70 MG tablet Take 70 mg by mouth once a week. Take with a full glass of water on an empty stomach.    Marland Kitchen amLODipine (NORVASC) 10 MG tablet TAKE 1 TABLET BY MOUTH EVERY DAY 30 tablet 9  . aspirin 81 MG tablet Take 81 mg by mouth daily.    . carvedilol (COREG) 3.125 MG tablet TAKE 1 TABLET BY MOUTH TWICE A DAY 60 tablet 11  . cholecalciferol (VITAMIN D) 1000 UNITS tablet Take 1,000 Units by mouth daily.    . cyanocobalamin 1000 MCG tablet Take 100 mcg by mouth daily.    . diclofenac sodium (VOLTAREN) 1 % GEL APPLY TO AFFECTED AREA 4 TIMES A DAY AS NEEDED  5  . diphenhydrAMINE (BENADRYL) 25 MG tablet Take 25 mg by mouth every 6 (six) hours as needed.    Marland Kitchen emollient (BIAFINE) cream Apply topically 2 (two) times daily.    Marland Kitchen escitalopram (LEXAPRO) 10 MG  tablet Take 10 mg by mouth daily.    Marland Kitchen ibuprofen (ADVIL,MOTRIN) 100 MG/5ML suspension Take 200 mg by mouth every 4 (four) hours as needed.    Marland Kitchen ipratropium (ATROVENT) 0.06 % nasal spray Place 1 spray into the nose daily.    . irbesartan (AVAPRO) 300 MG tablet Take 1 tablet (300 mg total) by mouth daily. 30 tablet 11  . latanoprost (XALATAN) 0.005 % ophthalmic solution Place 1 drop into both  eyes at bedtime.    Marland Kitchen letrozole (FEMARA) 2.5 MG tablet Take 1 tablet (2.5 mg total) by mouth daily. 90 tablet 3  . nitroGLYCERIN (NITROSTAT) 0.4 MG SL tablet Place 1 tablet (0.4 mg total) under the tongue every 5 (five) minutes as needed for chest pain. 25 tablet 6  . non-metallic deodorant (ALRA) MISC Apply 1 application topically daily as needed.    . simvastatin (ZOCOR) 20 MG tablet Take 10 mg by mouth daily.     No current facility-administered medications for this visit.     PHYSICAL EXAMINATION: ECOG PERFORMANCE STATUS: 1 - Symptomatic but completely ambulatory  Vitals:   08/03/16 1356  BP: (!) 136/57  Pulse: 60  Resp: 18  Temp: 98 F (36.7 C)   Filed Weights   08/03/16 1356  Weight: 148 lb 3.2 oz (67.2 kg)    GENERAL:alert, no distress and comfortable SKIN: skin color, texture, turgor are normal, no rashes or significant lesions EYES:Vision impairment  OROPHARYNX:no exudate, no erythema and lips, buccal mucosa, and tongue normal  NECK: supple, thyroid normal size, non-tender, without nodularity LYMPH:  no palpable lymphadenopathy in the cervical, axillary or inguinal LUNGS: clear to auscultation and percussion with normal breathing effort HEART: regular rate & rhythm and no murmurs and no lower extremity edema ABDOMEN:abdomen soft, non-tender and normal bowel sounds MUSCULOSKELETAL:no cyanosis of digits and no clubbing  NEURO: alert & oriented x 3 with fluent speech, no focal motor/sensory deficits EXTREMITIES: No lower extremity edema  LABORATORY DATA:  I have reviewed the data as listed   Chemistry      Component Value Date/Time   NA 142 01/01/2016 1240   K 4.0 01/01/2016 1240   CL 110 07/31/2011 2102   CO2 20 (L) 01/01/2016 1240   BUN 12.0 01/01/2016 1240   CREATININE 0.9 01/01/2016 1240      Component Value Date/Time   CALCIUM 9.4 01/01/2016 1240   ALKPHOS 84 01/01/2016 1240   AST 17 01/01/2016 1240   ALT 9 01/01/2016 1240   BILITOT 1.16 01/01/2016  1240       Lab Results  Component Value Date   WBC 3.5 (L) 08/03/2016   HGB 12.2 08/03/2016   HCT 35.8 08/03/2016   MCV 88.8 08/03/2016   PLT 177 08/03/2016   NEUTROABS 2.2 08/03/2016    ASSESSMENT & PLAN:  Breast cancer of upper-outer quadrant of left female breast ([place]) Left lumpectomy 01/29/2016: IDC with DCIS, 1 cm, margins negative, fibrocystic changes,1/2 lymph nodes positive ( intramammary nodes)T1bN1 stage II a  Adjuvant radiation therapy completed 04/29/16  Treatment Plan: adjuvant antiestrogen therapy with Letrozole 2.5 mg daily started 05/07/16 Osteoporosis: Patient forgot to take Fosamax that she had at home. She will plan to use up all the Fosamax that she has before starting the Prolia injections. We will cancel today's Prolia injection.  Letrozole toxicities: Denies any hot flashes or myalgias.  Return to clinic in 6 months for follow-up and Prolia   I spent 25 minutes talking to the patient of which more than  half was spent in counseling and coordination of care.  No orders of the defined types were placed in this encounter.  The patient has a good understanding of the overall plan. she agrees with it. she will call with any problems that may develop before the next visit here.   Rulon Eisenmenger, MD 08/03/16

## 2016-08-03 NOTE — Progress Notes (Signed)
Prolia held today per Dr. Geralyn Flash note

## 2016-08-03 NOTE — Patient Instructions (Signed)

## 2016-10-22 ENCOUNTER — Encounter: Payer: Medicare Other | Admitting: Adult Health

## 2016-10-28 ENCOUNTER — Other Ambulatory Visit: Payer: Self-pay | Admitting: Cardiology

## 2016-11-09 ENCOUNTER — Other Ambulatory Visit: Payer: Self-pay | Admitting: Cardiology

## 2016-12-17 ENCOUNTER — Encounter: Payer: Self-pay | Admitting: Radiation Oncology

## 2016-12-17 ENCOUNTER — Ambulatory Visit
Admission: RE | Admit: 2016-12-17 | Discharge: 2016-12-17 | Disposition: A | Discharge status: Home / Self Care | Payer: Medicare Other | Source: Ambulatory Visit | Attending: Radiation Oncology | Admitting: Radiation Oncology

## 2016-12-17 DIAGNOSIS — Z7982 Long term (current) use of aspirin: Secondary | ICD-10-CM | POA: Insufficient documentation

## 2016-12-17 DIAGNOSIS — Z79899 Other long term (current) drug therapy: Secondary | ICD-10-CM | POA: Insufficient documentation

## 2016-12-17 DIAGNOSIS — Z79811 Long term (current) use of aromatase inhibitors: Secondary | ICD-10-CM | POA: Diagnosis not present

## 2016-12-17 DIAGNOSIS — Z7983 Long term (current) use of bisphosphonates: Secondary | ICD-10-CM | POA: Insufficient documentation

## 2016-12-17 DIAGNOSIS — C50412 Malignant neoplasm of upper-outer quadrant of left female breast: Secondary | ICD-10-CM | POA: Diagnosis not present

## 2016-12-17 DIAGNOSIS — Z923 Personal history of irradiation: Secondary | ICD-10-CM | POA: Diagnosis not present

## 2016-12-17 DIAGNOSIS — Z170 Estrogen receptor positive status [ER+]: Secondary | ICD-10-CM | POA: Diagnosis not present

## 2016-12-17 NOTE — Progress Notes (Signed)
Olivia Gray is here today for follow up to radiation treatment that completed on 04/29/2016.  Patient denies having any pain, decrease in appetite, fatigue.  Patient denies using any creams, lotions to the left breast.  Patient skin to the left breast is lightening and returning to normal.    Vitals:   12/17/16 1054  BP: (!) 125/55  Pulse: (!) 56  Resp: 18  Temp: 98.1 F (36.7 C)  TempSrc: Oral  SpO2: 100%  Weight: 142 lb 9.6 oz (64.7 kg)   Wt Readings from Last 3 Encounters:  12/17/16 142 lb 9.6 oz (64.7 kg)  08/03/16 148 lb 3.2 oz (67.2 kg)  07/21/16 147 lb (66.7 kg)

## 2016-12-17 NOTE — Progress Notes (Signed)
Radiation Oncology         [phone] ________________________________  Name: Olivia Gray MRN: [id number]  Date: 12/17/2016  DOB: 11-16-1932     Follow-Up Visit Note  CC: [name], MD  [name], MD    ICD-10-CM   1. Malignant neoplasm of upper-outer quadrant of left breast in female, estrogen receptor positive [facility]) C50.412    Z17.0     Diagnosis: Stage IIA (pT1b, pN1a) grade 1 invasive ductal carcinoma of the left breast (ER+,PR+,HER2-)  Interval Since Last Radiation: 7.5 months   03/11/16-04/29/16: 50.4 Gy in 28 fractions to the Left breast / axilla with a 10 Gy in 5 fraction Left breast boost   Narrative:  The patient returns today for routine follow-up. Patient denies having any pain, decrease in appetite, or fatigue. Patient denies using any creams or lotions to the left breast. Patient skin to the left breast is lightening and returning to normal. She denies any problems with swelling or pain in the left arm since radiation. She reports occasional pain in the left breast but, nothing constant. She denies discharge from the breast. She denies cough or any other problems. She is scheduled to see Dr. Lindi Adie on 02/04/2017.   ALLERGIES:  has No Known Allergies.  Meds: Current Outpatient Prescriptions  Medication Sig Dispense Refill  . amLODipine (NORVASC) 10 MG tablet TAKE 1 TABLET BY MOUTH EVERY DAY 30 tablet 9  . aspirin 81 MG tablet Take 81 mg by mouth daily.    . carvedilol (COREG) 3.125 MG tablet TAKE 1 TABLET BY MOUTH TWICE A DAY 60 tablet 4  . cholecalciferol (VITAMIN D) 1000 UNITS tablet Take 1,000 Units by mouth daily.    . cyanocobalamin 1000 MCG tablet Take 100 mcg by mouth daily.    . diclofenac sodium (VOLTAREN) 1 % GEL APPLY TO AFFECTED AREA 4 TIMES A DAY AS NEEDED  5  . diphenhydrAMINE (BENADRYL) 25 MG tablet Take 25 mg by mouth every 6 (six) hours as needed.    Marland Kitchen escitalopram (LEXAPRO) 10 MG tablet Take 10 mg by mouth daily.    Marland Kitchen ibuprofen  (ADVIL,MOTRIN) 100 MG/5ML suspension Take 200 mg by mouth every 4 (four) hours as needed.    Marland Kitchen ipratropium (ATROVENT) 0.06 % nasal spray Place 1 spray into the nose daily.    . irbesartan (AVAPRO) 300 MG tablet TAKE 1 TABLET (300 MG TOTAL) BY MOUTH DAILY. 30 tablet 1  . latanoprost (XALATAN) 0.005 % ophthalmic solution Place 1 drop into both eyes at bedtime.    Marland Kitchen letrozole (FEMARA) 2.5 MG tablet Take 1 tablet (2.5 mg total) by mouth daily. 90 tablet 3  . nitroGLYCERIN (NITROSTAT) 0.4 MG SL tablet Place 1 tablet (0.4 mg total) under the tongue every 5 (five) minutes as needed for chest pain. 25 tablet 6  . simvastatin (ZOCOR) 20 MG tablet Take 10 mg by mouth daily.    Marland Kitchen alendronate (FOSAMAX) 70 MG tablet Take 70 mg by mouth once a week. Take with a full glass of water on an empty stomach.    . emollient (BIAFINE) cream Apply topically 2 (two) times daily.    . non-metallic deodorant Jethro Poling) MISC Apply 1 application topically daily as needed.     No current facility-administered medications for this encounter.     Physical Findings: The patient is in no acute distress. Patient is alert and oriented. Patient is legally blind.   weight is 142 lb 9.6 oz (64.7 kg). Her oral temperature is 98.1  F (36.7 C). Her blood pressure is 125/55 (abnormal) and her pulse is 56 (abnormal). Her respiration is 18 and oxygen saturation is 100%.   Lungs are clear to auscultation bilaterally. Heart has regular rate and rhythm. No palpable cervical, supraclavicular, or axillary adenopathy.  Right breast no mass, nipple discharge/bleeding. Left breast continues to having some mild swelling and hyperpigmentation changes, no sign of infection, no nipple discharge or bleeding. No palpable mass.  Lab Findings: Lab Results  Component Value Date   WBC 3.5 (L) 08/03/2016   HGB 12.2 08/03/2016   HCT 35.8 08/03/2016   MCV 88.8 08/03/2016   PLT 177 08/03/2016    Radiographic Findings: No results found.  Impression:    No evidence of recurrence on clinical exam. She continues to have some mild swelling and hyperpigmentation changes in the left breast.   Plan:  She will follow up in radiation oncology as needed. She will continue to follow-up regularly in medical oncology and surgery. She is scheduled to follow up with Dr. Lindi Adie on 02/04/17. -----------------------------------  Blair Promise, PhD, MD  This document serves as a record of services personally performed by Gery Pray, MD. It was created on his behalf by Arlyce Harman, a trained medical scribe. The creation of this record is based on the scribe's personal observations and the provider's statements to them. This document has been checked and approved by the attending provider.

## 2016-12-30 ENCOUNTER — Other Ambulatory Visit: Payer: Self-pay | Admitting: Cardiology

## 2016-12-31 NOTE — Telephone Encounter (Signed)
REFILL 

## 2017-02-04 ENCOUNTER — Ambulatory Visit (HOSPITAL_BASED_OUTPATIENT_CLINIC_OR_DEPARTMENT_OTHER): Payer: Medicare Other

## 2017-02-04 ENCOUNTER — Telehealth: Payer: Self-pay | Admitting: Hematology and Oncology

## 2017-02-04 ENCOUNTER — Other Ambulatory Visit (HOSPITAL_BASED_OUTPATIENT_CLINIC_OR_DEPARTMENT_OTHER): Payer: Medicare Other

## 2017-02-04 ENCOUNTER — Ambulatory Visit (HOSPITAL_BASED_OUTPATIENT_CLINIC_OR_DEPARTMENT_OTHER): Payer: Medicare Other | Admitting: Hematology and Oncology

## 2017-02-04 ENCOUNTER — Other Ambulatory Visit: Payer: Self-pay | Admitting: *Deleted

## 2017-02-04 VITALS — BP 142/62 | HR 59 | Temp 98.5°F | Resp 18 | Ht 64.0 in | Wt 145.6 lb

## 2017-02-04 DIAGNOSIS — C50412 Malignant neoplasm of upper-outer quadrant of left female breast: Secondary | ICD-10-CM

## 2017-02-04 DIAGNOSIS — M810 Age-related osteoporosis without current pathological fracture: Secondary | ICD-10-CM

## 2017-02-04 DIAGNOSIS — Z170 Estrogen receptor positive status [ER+]: Secondary | ICD-10-CM

## 2017-02-04 LAB — CBC WITH DIFFERENTIAL/PLATELET
BASO%: 0.6 % (ref 0.0–2.0)
Basophils Absolute: 0 10*3/uL (ref 0.0–0.1)
EOS%: 1.2 % (ref 0.0–7.0)
Eosinophils Absolute: 0 10*3/uL (ref 0.0–0.5)
HEMATOCRIT: 36.1 % (ref 34.8–46.6)
HEMOGLOBIN: 11.9 g/dL (ref 11.6–15.9)
LYMPH#: 0.8 10*3/uL — AB (ref 0.9–3.3)
LYMPH%: 19.5 % (ref 14.0–49.7)
MCH: 30.1 pg (ref 25.1–34.0)
MCHC: 32.9 g/dL (ref 31.5–36.0)
MCV: 91.3 fL (ref 79.5–101.0)
MONO#: 0.5 10*3/uL (ref 0.1–0.9)
MONO%: 12.5 % (ref 0.0–14.0)
NEUT%: 66.2 % (ref 38.4–76.8)
NEUTROS ABS: 2.6 10*3/uL (ref 1.5–6.5)
Platelets: 166 10*3/uL (ref 145–400)
RBC: 3.95 10*6/uL (ref 3.70–5.45)
RDW: 13.7 % (ref 11.2–14.5)
WBC: 4 10*3/uL (ref 3.9–10.3)

## 2017-02-04 LAB — COMPREHENSIVE METABOLIC PANEL
ALBUMIN: 3.6 g/dL (ref 3.5–5.0)
ALK PHOS: 85 U/L (ref 40–150)
ALT: 14 U/L (ref 0–55)
AST: 17 U/L (ref 5–34)
Anion Gap: 7 mEq/L (ref 3–11)
BUN: 16.7 mg/dL (ref 7.0–26.0)
CALCIUM: 9.1 mg/dL (ref 8.4–10.4)
CO2: 24 mEq/L (ref 22–29)
CREATININE: 0.9 mg/dL (ref 0.6–1.1)
Chloride: 112 mEq/L — ABNORMAL HIGH (ref 98–109)
EGFR: >60 mL/min/{1.73_m2} (ref >60)
GLUCOSE: 98 mg/dL (ref 70–140)
Potassium: 4.2 mEq/L (ref 3.5–5.1)
SODIUM: 143 meq/L (ref 136–145)
Total Bilirubin: 0.89 mg/dL (ref 0.20–1.20)
Total Protein: 7.1 g/dL (ref 6.4–8.3)

## 2017-02-04 MED ORDER — LETROZOLE 2.5 MG PO TABS: 2.5000 mg | ORAL_TABLET | Freq: Every day | ORAL | 3 refills | Status: DC

## 2017-02-04 MED ORDER — DENOSUMAB 60 MG/ML ~~LOC~~ SOLN
60.0000 mg | Freq: Once | SUBCUTANEOUS | Status: AC
  Administered 2017-02-04: 60 mg via SUBCUTANEOUS
  Filled 2017-02-04: qty 1

## 2017-02-04 NOTE — Assessment & Plan Note (Addendum)
Left lumpectomy 01/29/2016: IDC with DCIS, 1 cm, margins negative, fibrocystic changes,1/2 lymph nodes positive ( intramammary nodes)T1bN1 stage II a  Adjuvant radiation therapy completed 04/29/16  Treatment Plan: adjuvant antiestrogen therapy with Letrozole 2.5 mg daily started 05/07/16 Osteoporosis: Starting Prolia today  Letrozole toxicities: Occasional hot flashes but denies any problems Return to clinic in 6 months for follow-up and Prolia

## 2017-02-04 NOTE — Patient Instructions (Signed)

## 2017-02-04 NOTE — Progress Notes (Signed)
Patient Care Team: [name], MD as PCP - General (Internal Medicine) [name], MD as Consulting Physician (General Surgery) Nicholas Lose, MD as Consulting Physician (Hematology and Oncology) Gery Pray, MD as Consulting Physician (Radiation Oncology) Delice Bison Charlestine Massed, NP as Nurse Practitioner (Hematology and Oncology)  DIAGNOSIS:  Encounter Diagnosis  Name Primary?  . Malignant neoplasm of upper-outer quadrant of left breast in female, estrogen receptor positive ([place]) Yes    SUMMARY OF ONCOLOGIC HISTORY:   Breast cancer of upper-outer quadrant of left female breast ([place])   12/26/2015 Initial Diagnosis    Left breast biopsy 1:00: Invasive ductal carcinoma grade 1, DCIS,ER 95%, PR 95%, Ki-67 10%, HER-2 negative ratio 1.12; left breast mass 1:00 position 7 mm size axilla ultrasound negative, T1 BN 0 stage IA clinical stage      01/29/2016 Surgery    Left lumpectomy: IDC with DCIS, 1 cm, margins negative, fibrocystic changes,1/2 lymph nodes positive ( intramammary nodes)T1bN1 stage II a      03/11/2016 - 04/29/2016 Radiation Therapy    Adj XRT (kinard): 1)Left breast/axilla / 50.4 Gy in 28 fractions  2) Left breast boost / 10 Gy in 5 fractions.        05/07/2016 -  Anti-estrogen oral therapy    Letrozole 2.5 mg daily along with Prolia every 6 months       CHIEF COMPLIANT: Follow-up on letrozole therapy  INTERVAL HISTORY: Olivia Gray is a 83 year old African American lady with above-mentioned history left breast cancer treated with lumpectomy radiation and is currently on letrozole therapy and appears to be tolerating it fairly well. She has very occasional hot flashes. We have discussed the role of bisphosphonate therapy and she was supposed to start Fosamax but she has not been taking it because she is afraid of side effects from the treatment. She is here today to receive Prolia injection.  REVIEW OF SYSTEMS:   Constitutional: Denies fevers, chills or  abnormal weight loss Eyes: Denies blurriness of vision Ears, nose, mouth, throat, and face: Denies mucositis or sore throat Respiratory: Denies cough, dyspnea or wheezes Cardiovascular: Denies palpitation, chest discomfort Gastrointestinal:  Denies nausea, heartburn or change in bowel habits Skin: Denies abnormal skin rashes Lymphatics: Denies new lymphadenopathy or easy bruising Neurological:Denies numbness, tingling or new weaknesses Behavioral/Psych: Mood is stable, no new changes  Extremities: No lower extremity edema  All other systems were reviewed with the patient and are negative.  I have reviewed the past medical history, past surgical history, social history and family history with the patient and they are unchanged from previous note.  ALLERGIES:  has No Known Allergies.  MEDICATIONS:  Current Outpatient Prescriptions  Medication Sig Dispense Refill  . amLODipine (NORVASC) 10 MG tablet TAKE 1 TABLET BY MOUTH EVERY DAY 30 tablet 9  . aspirin 81 MG tablet Take 81 mg by mouth daily.    . carvedilol (COREG) 3.125 MG tablet TAKE 1 TABLET BY MOUTH TWICE A DAY 60 tablet 4  . cholecalciferol (VITAMIN D) 1000 UNITS tablet Take 1,000 Units by mouth daily.    . cyanocobalamin 1000 MCG tablet Take 100 mcg by mouth daily.    . diclofenac sodium (VOLTAREN) 1 % GEL APPLY TO AFFECTED AREA 4 TIMES A DAY AS NEEDED  5  . diphenhydrAMINE (BENADRYL) 25 MG tablet Take 25 mg by mouth every 6 (six) hours as needed.    Marland Kitchen emollient (BIAFINE) cream Apply topically 2 (two) times daily.    Marland Kitchen escitalopram (LEXAPRO) 10 MG tablet Take  10 mg by mouth daily.    Marland Kitchen ibuprofen (ADVIL,MOTRIN) 100 MG/5ML suspension Take 200 mg by mouth every 4 (four) hours as needed.    Marland Kitchen ipratropium (ATROVENT) 0.06 % nasal spray Place 1 spray into the nose daily.    . irbesartan (AVAPRO) 300 MG tablet TAKE 1 TABLET (300 MG TOTAL) BY MOUTH DAILY. 30 tablet 1  . latanoprost (XALATAN) 0.005 % ophthalmic solution Place 1 drop into  both eyes at bedtime.    Marland Kitchen letrozole (FEMARA) 2.5 MG tablet Take 1 tablet (2.5 mg total) by mouth daily. 90 tablet 3  . nitroGLYCERIN (NITROSTAT) 0.4 MG SL tablet Place 1 tablet (0.4 mg total) under the tongue every 5 (five) minutes as needed for chest pain. 25 tablet 6  . non-metallic deodorant (ALRA) MISC Apply 1 application topically daily as needed.    . simvastatin (ZOCOR) 20 MG tablet Take 10 mg by mouth daily.     No current facility-administered medications for this visit.    Facility-Administered Medications Ordered in Other Visits  Medication Dose Route Frequency Provider Last Rate Last Dose  . denosumab (PROLIA) injection 60 mg  60 mg Subcutaneous Once Nicholas Lose, MD        PHYSICAL EXAMINATION: ECOG PERFORMANCE STATUS: 1 - Symptomatic but completely ambulatory  Vitals:   02/04/17 1316  BP: (!) 142/62  Pulse: (!) 59  Resp: 18  Temp: 98.5 F (36.9 C)  SpO2: 99%   Filed Weights   02/04/17 1316  Weight: 145 lb 9.6 oz (66 kg)    GENERAL:alert, no distress and comfortable SKIN: skin color, texture, turgor are normal, no rashes or significant lesions EYES:blindness OROPHARYNX:no exudate, no erythema and lips, buccal mucosa, and tongue normal  NECK: supple, thyroid normal size, non-tender, without nodularity LYMPH:  no palpable lymphadenopathy in the cervical, axillary or inguinal LUNGS: clear to auscultation and percussion with normal breathing effort HEART: regular rate & rhythm and no murmurs and no lower extremity edema ABDOMEN:abdomen soft, non-tender and normal bowel sounds MUSCULOSKELETAL:no cyanosis of digits and no clubbing  NEURO: alert & oriented x 3 with fluent speech, no focal motor/sensory deficits EXTREMITIES: No lower extremity edema  LABORATORY DATA:  I have reviewed the data as listed   Chemistry      Component Value Date/Time   NA 143 02/04/2017 1258   K 4.2 02/04/2017 1258   CL 110 07/31/2011 2102   CO2 24 02/04/2017 1258   BUN 16.7  02/04/2017 1258   CREATININE 0.9 02/04/2017 1258      Component Value Date/Time   CALCIUM 9.1 02/04/2017 1258   ALKPHOS 85 02/04/2017 1258   AST 17 02/04/2017 1258   ALT 14 02/04/2017 1258   BILITOT 0.89 02/04/2017 1258       Lab Results  Component Value Date   WBC 4.0 02/04/2017   HGB 11.9 02/04/2017   HCT 36.1 02/04/2017   MCV 91.3 02/04/2017   PLT 166 02/04/2017   NEUTROABS 2.6 02/04/2017    ASSESSMENT & PLAN:  Breast cancer of upper-outer quadrant of left female breast ([place]) Left lumpectomy 01/29/2016: IDC with DCIS, 1 cm, margins negative, fibrocystic changes,1/2 lymph nodes positive ( intramammary nodes)T1bN1 stage II a  Adjuvant radiation therapy completed 04/29/16  Treatment Plan: adjuvant antiestrogen therapy with Letrozole 2.5 mg daily started 05/07/16 Osteoporosis: Starting Prolia today  Letrozole toxicities: Occasional hot flashes but denies any problems Return to clinic in 6 months for follow-up and Prolia   I spent 25 minutes talking to the  patient of which more than half was spent in counseling and coordination of care.  Orders Placed This Encounter  Procedures  . CBC with Differential    Standing Status:   Future    Standing Expiration Date:   02/04/2018  . Comprehensive metabolic panel    Standing Status:   Future    Standing Expiration Date:   02/04/2018   The patient has a good understanding of the overall plan. she agrees with it. she will call with any problems that may develop before the next visit here.   Rulon Eisenmenger, MD 02/04/17

## 2017-02-04 NOTE — Telephone Encounter (Signed)
Gave patient avs report and appointments for April 2019.  °

## 2017-02-24 ENCOUNTER — Other Ambulatory Visit: Payer: Self-pay | Admitting: Cardiology

## 2017-02-24 NOTE — Telephone Encounter (Signed)
REFILL 

## 2017-04-08 ENCOUNTER — Other Ambulatory Visit: Payer: Self-pay | Admitting: Cardiology

## 2017-04-28 ENCOUNTER — Other Ambulatory Visit: Payer: Self-pay | Admitting: Cardiology

## 2017-05-04 ENCOUNTER — Other Ambulatory Visit: Payer: Self-pay

## 2017-05-04 MED ORDER — AMLODIPINE BESYLATE 10 MG PO TABS: 10.0000 mg | ORAL_TABLET | Freq: Every day | ORAL | 4 refills | Status: DC

## 2017-05-06 ENCOUNTER — Other Ambulatory Visit: Payer: Self-pay

## 2017-05-06 MED ORDER — AMLODIPINE BESYLATE 10 MG PO TABS: 10.0000 mg | ORAL_TABLET | Freq: Every day | ORAL | 0 refills | Status: DC

## 2017-05-06 NOTE — Telephone Encounter (Signed)
Rx(s) sent to pharmacy electronically.  

## 2017-05-24 ENCOUNTER — Encounter: Payer: Self-pay | Admitting: Cardiology

## 2017-05-24 ENCOUNTER — Ambulatory Visit: Payer: Medicare Other | Admitting: Cardiology

## 2017-05-24 VITALS — BP 146/68 | HR 52 | Ht 64.0 in | Wt 146.8 lb

## 2017-05-24 DIAGNOSIS — I2510 Atherosclerotic heart disease of native coronary artery without angina pectoris: Secondary | ICD-10-CM | POA: Diagnosis not present

## 2017-05-24 DIAGNOSIS — Z951 Presence of aortocoronary bypass graft: Secondary | ICD-10-CM | POA: Diagnosis not present

## 2017-05-24 DIAGNOSIS — E785 Hyperlipidemia, unspecified: Secondary | ICD-10-CM

## 2017-05-24 DIAGNOSIS — I10 Essential (primary) hypertension: Secondary | ICD-10-CM

## 2017-05-24 DIAGNOSIS — I341 Nonrheumatic mitral (valve) prolapse: Secondary | ICD-10-CM | POA: Diagnosis not present

## 2017-05-24 NOTE — Progress Notes (Signed)
PCP: [name], MD,  Sees the PA Marc Morgans)  Clinic Note: Chief Complaint  Patient presents with  . Follow-up    No complaints  . Coronary Artery Disease    HPI: Olivia Gray is a 84 y.o. female with a PMH below who presents today for delayed six-month follow-up for CAD-CABG. Last Myoview was 2016.  Olivia Gray was last seen on Jan 2018 - doing well.    Recent Hospitalizations:  -She is currently undergoing Oral Chemo (Letrozole 2.5 mg daily along with Prolia every 6 months) after XRT for L Br Ca after Lumpectomy on Oct 2017.  Studies Reviewed: No recent studies  Interval History: Olivia Gray actually is doing pretty well overall from a cardiac standpoint.  She is limited as far as any exercise or activity goes because of her essential blindness.  However for what she is able to do, she denies any chest tightness or pressure with rest or exertion.  No significant exertional dyspnea unless she overdoes it.  She is limited right now mild left leg hip/knee pain for which she is going to see orthopedics.  -She is still trying to her calisthenic exercises and tries to walk stairs.  She denies any rapid irregular heartbeats palpitations --no further sensation of palpitations..  No syncope/near syncope or TIA/amaurosis fugax. No PND, orthopnea with minimal edema. No claudication.   ROS: A comprehensive was performed. Review of Systems  Constitutional: Negative for malaise/fatigue.  HENT: Negative for nosebleeds.   Respiratory: Negative for shortness of breath.   Cardiovascular: Negative for leg swelling.       Per history of present illness  Gastrointestinal: Positive for heartburn (Depending on what she eats). Negative for abdominal pain, blood in stool, melena, nausea and vomiting.  Genitourinary: Negative for hematuria.  Musculoskeletal: Positive for joint pain (R hip&leg).  Neurological: Negative for dizziness and focal weakness.  All other systems reviewed and are  negative.    Past Medical History:  Diagnosis Date  . Breast cancer of upper-outer quadrant of left female breast ([facility]) 12/27/2015  . CAD, multiple vessel 2004   Myoview 2011: Mild Basal-mid Inferolateral "ischemia", EF ~76?% -- Anormal, but LOW RISK  . Essential hypertension   . History of radiation therapy 03/11/16-04/29/16   left breast/axilla 50.4 Gy in 28 fractions, boost of 10 Gy in 5 fractions  . Hyperlipidemia LDL goal <70    On statin.  . Legally blind   . Mitral valve prolapse 02/07/2008; April 2015   a) Mild Anterior MVP, with mild MR.;; b) Echo 07/2013: EF 55-60%, Gr 1 DD, no WMA, Mild central MR with late systolic prolapse   . Pre-diabetes   . Retinitis pigmentosa of both eyes     now essentially legally blind.  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 S/P CABG x 4 2004   SVG-LAD, SVG-D1, SVG-OM1, SVG-RPDA. (Native LIMA was 100% occluded)    Past Surgical History:  Procedure Laterality Date  . BREAST LUMPECTOMY WITH RADIOACTIVE SEED LOCALIZATION Left 01/29/2016   Procedure: LEFT BREAST LUMPECTOMY WITH RADIOACTIVE SEED LOCALIZATION;  Surgeon: [name], MD;  Location: [facility];  Service: General;  Laterality: Left;  . BREAST SURGERY Bilateral    cyst removal  . CARDIAC CATHETERIZATION  05/22/2009   all grafts patent ,LV systolic fx normal,small -vessel diabetic disease in native vessels   . CHOLECYSTECTOMY  02/05/2005  .  CORONARY ARTERY BYPASS GRAFT  07/21/2002   in [place] ; SVG-LAD, SVG-D1, SVG-OM1, SVG RPDA.  . DOPPLER ECHOCARDIOGRAPHY  02/07/2008; April 2015   EF=>55%; LV normal; mild anterior MVP with mild MR.; b) 07/2013: EF 55-60%. Normal wall motion. Gr 1 DD, mild MR with late systolic prolapse. Normal central venous and pulmonary arterial pressures   . NM MYOCAR PERF WALL MOTION  04/2009   EF 76%; mild ischeia basal  inferolateral,mid inferoinlateral regions(s), abn pharmacologic nuc test, deffect indicte new ischemia,LV syst.fx normal --> cath showing no obstructive targets.  Marland Kitchen NM MYOVIEW LTD  June 2016   Normal perfusion. Normal study. LOW RISK. Normal EF (55%). Compared to prior study, no ischemic changes present.    Current Meds  Medication Sig  . amLODipine (NORVASC) 10 MG tablet Take 1 tablet (10 mg total) by mouth daily. Please keep your appointment with Dr Ellyn Hack scheduled for 05/24/17 for future refills.  Marland Kitchen aspirin 81 MG tablet Take 81 mg by mouth daily.  . carvedilol (COREG) 3.125 MG tablet TAKE 1 TABLET BY MOUTH TWICE A DAY  . cholecalciferol (VITAMIN D) 1000 UNITS tablet Take 1,000 Units by mouth daily.  . cyanocobalamin 1000 MCG tablet Take 100 mcg by mouth daily.  . diclofenac sodium (VOLTAREN) 1 % GEL APPLY TO AFFECTED AREA 4 TIMES A DAY AS NEEDED  . diphenhydrAMINE (BENADRYL) 25 MG tablet Take 25 mg by mouth every 6 (six) hours as needed.  Marland Kitchen emollient (BIAFINE) cream Apply topically 2 (two) times daily.  Marland Kitchen escitalopram (LEXAPRO) 10 MG tablet Take 10 mg by mouth daily.  Marland Kitchen ibuprofen (ADVIL,MOTRIN) 100 MG/5ML suspension Take 200 mg by mouth every 4 (four) hours as needed.  Marland Kitchen ipratropium (ATROVENT) 0.06 % nasal spray Place 1 spray into the nose daily.  . irbesartan (AVAPRO) 300 MG tablet TAKE 1 TABLET (300 MG TOTAL) BY MOUTH DAILY.  Marland Kitchen latanoprost (XALATAN) 0.005 % ophthalmic solution Place 1 drop into both eyes at bedtime.  Marland Kitchen letrozole (FEMARA) 2.5 MG tablet Take 1 tablet (2.5 mg total) by mouth daily.  . nitroGLYCERIN (NITROSTAT) 0.4 MG SL tablet Place 1 tablet (0.4 mg total) under the tongue every 5 (five) minutes as needed for chest pain.  . non-metallic deodorant Jethro Poling) MISC Apply 1 application topically daily as needed.  . simvastatin (ZOCOR) 20 MG tablet Take 10 mg by mouth daily.    No Known Allergies  Social History   Socioeconomic History  . Marital status: Married    Spouse  name: None  . Number of children: 2  . Years of education: None  . Highest education level: None  Social Needs  . Financial resource strain: None  . Food insecurity - worry: None  . Food insecurity - inability: None  . Transportation needs - medical: None  . Transportation needs - non-medical: None  Occupational History  . None  Tobacco Use  . Smoking status: Never Smoker  . Smokeless tobacco: Never Used  Substance and Sexual Activity  . Alcohol use: No  . Drug use: No  . Sexual activity: Yes    Birth control/protection: Post-menopausal  Other Topics Concern  . None  Social History Narrative   She is married with 2 children. Does not smoke or drink alcohol. She does routine exercise roughly 3-4 days a week where she follows a   The video. Weights stomach exercises jumping jacks and other aerobic type exercises.    family history includes Breast cancer in her sister and sister.  Wt  Readings from Last 3 Encounters:  05/24/17 146 lb 12.8 oz (66.6 kg)  02/04/17 145 lb 9.6 oz (66 kg)  12/17/16 142 lb 9.6 oz (64.7 kg)    PHYSICAL EXAM BP (!) 146/68   Pulse (!) 52   Ht 5\' 4"  (1.626 m)   Wt 146 lb 12.8 oz (66.6 kg)   BMI 25.20 kg/m   Physical Exam  Constitutional: She is oriented to person, place, and time. She appears well-developed and well-nourished. No distress.  HENT:  Head: Normocephalic and atraumatic.  Eyes:  Legally blind.  Neck: No hepatojugular reflux and no JVD present. Carotid bruit is not present.  Cardiovascular: Normal rate, regular rhythm, S1 normal, S2 normal and normal pulses.  No extrasystoles are present. PMI is not displaced. Exam reveals no gallop and no friction rub.  Murmur heard.  Holosystolic murmur is present with a grade of 1/6 at the upper left sternal border and lower left sternal border radiating to the axilla. Associated with Soft systolic click Pulmonary/Chest: Effort normal and breath sounds normal. No respiratory distress. She has no  wheezes. She has no rales.  Abdominal: Soft. Bowel sounds are normal. She exhibits no distension. There is no tenderness.  Musculoskeletal: Normal range of motion. She exhibits no edema.  Neurological: She is alert and oriented to person, place, and time.  Psychiatric: She has a normal mood and affect. Her behavior is normal. Judgment and thought content normal.  Nursing note and vitals reviewed.   Adult ECG Report  Rate: 52;  Rhythm: sinus bradycardia and Normal axis, intervals and durations; nonspecific ST and T-T-wave abnormality. Otherwise normal.  Narrative Interpretation: Stable EKG   Other studies Reviewed: Additional studies/ records that were reviewed today include:  Recent Labs:   Lab Results  Component Value Date   CREATININE 0.9 02/04/2017   BUN 16.7 02/04/2017   NA 143 02/04/2017   K 4.2 02/04/2017   CL 110 07/31/2011   CO2 24 02/04/2017   No results found for: CHOL, HDL, LDLCALC, LDLDIRECT, TRIG, CHOLHDL Lipids last checked by PCP ~ 11/2016.   ASSESSMENT / PLAN: Problem List Items Addressed This Visit    Coronary artery disease involving native heart without angina pectoris - Primary (Chronic)    History of CABG back in 2004 Nonischemic Myoview in 2016. Remains on aspirin, statin as well as beta-blocker, ARB and amlodipine.  No recurrent anginal symptoms.  No heart failure symptoms.  With resting bradycardia, cannot further titrate beta-blocker.  Plan: Continue current medications. She has indicated that she would probably forego doing a screening stress test next time around, and only evaluate if symptoms occur.      Relevant Orders   EKG 12-Lead (Completed)   Essential hypertension (Chronic)    Borderline elevated today.  Probably well within the range for permissive hypertension given her age to avoid orthostasis.  She is on 3 medications including amlodipine, low-dose carvedilol and high-dose Avapro.      Relevant Orders   EKG 12-Lead (Completed)    Hyperlipidemia LDL goal <70 (Chronic)    Labs are monitored by PCP.  She is on reduced dose of simvastatin while on Norvasc.  I have not seen recent labs, but she indicates they were checked roughly back in August.  If we need any further lipid control, would probably be better to switch to a different statin which would allow higher doses and not having an interaction with a calcium channel blocker.      Mitral valve anterior leaflet  prolapse (Chronic)    Minimal prolapse on echo.  Nothing to be concerned about.  No significant MR.      S/P CABG x 4 (Chronic)    14 years post CABG, doing well.  Plan at this point would be to avoid screening ischemic evaluation unless symptoms warrant.      Relevant Orders   EKG 12-Lead (Completed)      Current medicines are reviewed at length with the patient today. (+/- concerns) n/a The following changes have been made: n/a  Patient Instructions  NO CHANGES WITH CURRENT MEDICATION     Your physician wants you to follow-up in [place] Takila Kronberg.You will receive a reminder letter in the mail two months in advance. If you don't receive a letter, please call our office to schedule the follow-up appointment.    If you need a refill on your cardiac medications before your next appointment, please call your pharmacy.    Studies Ordered:   Orders Placed This Encounter  Procedures  . EKG 12-Lead      Glenetta Hew, M.D., M.S. Interventional Cardiologist   Pager # [phone] Phone # [phone] [address] [place], [place] [address]

## 2017-05-24 NOTE — Patient Instructions (Signed)
NO CHANGES WITH CURRENT MEDICATION     Your physician wants you to follow-up in [place].You will receive a reminder letter in the mail two months in advance. If you don't receive a letter, please call our office to schedule the follow-up appointment.    If you need a refill on your cardiac medications before your next appointment, please call your pharmacy.

## 2017-05-25 ENCOUNTER — Encounter: Payer: Self-pay | Admitting: Cardiology

## 2017-05-25 NOTE — Assessment & Plan Note (Signed)
Borderline elevated today.  Probably well within the range for permissive hypertension given her age to avoid orthostasis.  She is on 3 medications including amlodipine, low-dose carvedilol and high-dose Avapro.

## 2017-05-25 NOTE — Assessment & Plan Note (Signed)
History of CABG back in 2004 Nonischemic Myoview in 2016. Remains on aspirin, statin as well as beta-blocker, ARB and amlodipine.  No recurrent anginal symptoms.  No heart failure symptoms.  With resting bradycardia, cannot further titrate beta-blocker.  Plan: Continue current medications. She has indicated that she would probably forego doing a screening stress test next time around, and only evaluate if symptoms occur.

## 2017-05-25 NOTE — Assessment & Plan Note (Signed)
Labs are monitored by PCP.  She is on reduced dose of simvastatin while on Norvasc.  I have not seen recent labs, but she indicates they were checked roughly back in August.  If we need any further lipid control, would probably be better to switch to a different statin which would allow higher doses and not having an interaction with a calcium channel blocker.

## 2017-05-25 NOTE — Assessment & Plan Note (Signed)
14 years post CABG, doing well.  Plan at this point would be to avoid screening ischemic evaluation unless symptoms warrant.

## 2017-05-25 NOTE — Assessment & Plan Note (Signed)
Minimal prolapse on echo.  Nothing to be concerned about.  No significant MR.

## 2017-06-01 DIAGNOSIS — M545 Low back pain, unspecified: Secondary | ICD-10-CM | POA: Insufficient documentation

## 2017-07-20 ENCOUNTER — Other Ambulatory Visit: Payer: Self-pay | Admitting: Cardiology

## 2017-07-28 ENCOUNTER — Ambulatory Visit: Payer: Medicare Other | Admitting: Podiatry

## 2017-08-01 ENCOUNTER — Other Ambulatory Visit: Payer: Self-pay | Admitting: Cardiology

## 2017-08-03 ENCOUNTER — Other Ambulatory Visit: Payer: Self-pay | Admitting: Hematology and Oncology

## 2017-08-09 ENCOUNTER — Telehealth: Payer: Self-pay | Admitting: Hematology and Oncology

## 2017-08-09 ENCOUNTER — Inpatient Hospital Stay: Payer: Medicare Other

## 2017-08-09 ENCOUNTER — Inpatient Hospital Stay: Payer: Medicare Other | Attending: Hematology and Oncology | Admitting: Hematology and Oncology

## 2017-08-09 DIAGNOSIS — C50412 Malignant neoplasm of upper-outer quadrant of left female breast: Secondary | ICD-10-CM

## 2017-08-09 DIAGNOSIS — Z923 Personal history of irradiation: Secondary | ICD-10-CM | POA: Diagnosis not present

## 2017-08-09 DIAGNOSIS — Z170 Estrogen receptor positive status [ER+]: Secondary | ICD-10-CM | POA: Insufficient documentation

## 2017-08-09 DIAGNOSIS — Z79811 Long term (current) use of aromatase inhibitors: Secondary | ICD-10-CM | POA: Diagnosis not present

## 2017-08-09 DIAGNOSIS — Z79899 Other long term (current) drug therapy: Secondary | ICD-10-CM | POA: Insufficient documentation

## 2017-08-09 DIAGNOSIS — Z7982 Long term (current) use of aspirin: Secondary | ICD-10-CM | POA: Insufficient documentation

## 2017-08-09 DIAGNOSIS — M810 Age-related osteoporosis without current pathological fracture: Secondary | ICD-10-CM

## 2017-08-09 LAB — COMPREHENSIVE METABOLIC PANEL
ALBUMIN: 3.6 g/dL (ref 3.5–5.0)
ALT: 12 U/L (ref 0–55)
AST: 17 U/L (ref 5–34)
Alkaline Phosphatase: 62 U/L (ref 40–150)
Anion gap: 6 (ref 3–11)
BILIRUBIN TOTAL: 1.1 mg/dL (ref 0.2–1.2)
BUN: 11 mg/dL (ref 7–26)
CHLORIDE: 111 mmol/L — AB (ref 98–109)
CO2: 24 mmol/L (ref 22–29)
CREATININE: 0.93 mg/dL (ref 0.60–1.10)
Calcium: 9.6 mg/dL (ref 8.4–10.4)
GFR calc Af Amer: >60 mL/min (ref >60)
GFR, EST NON AFRICAN AMERICAN: 56 mL/min — AB (ref >60)
GLUCOSE: 90 mg/dL (ref 70–140)
Potassium: 4.1 mmol/L (ref 3.5–5.1)
Sodium: 141 mmol/L (ref 136–145)
Total Protein: 7.1 g/dL (ref 6.4–8.3)

## 2017-08-09 LAB — CBC WITH DIFFERENTIAL/PLATELET
BASOS ABS: 0 10*3/uL (ref 0.0–0.1)
BASOS PCT: 0 %
EOS ABS: 0.1 10*3/uL (ref 0.0–0.5)
Eosinophils Relative: 2 %
HEMATOCRIT: 35.6 % (ref 34.8–46.6)
Hemoglobin: 12 g/dL (ref 11.6–15.9)
Lymphocytes Relative: 30 %
Lymphs Abs: 1.1 10*3/uL (ref 0.9–3.3)
MCH: 30.3 pg (ref 25.1–34.0)
MCHC: 33.7 g/dL (ref 31.5–36.0)
MCV: 89.9 fL (ref 79.5–101.0)
MONO ABS: 0.6 10*3/uL (ref 0.1–0.9)
Monocytes Relative: 17 %
NEUTROS ABS: 1.9 10*3/uL (ref 1.5–6.5)
Neutrophils Relative %: 51 %
PLATELETS: 189 10*3/uL (ref 145–400)
RBC: 3.96 MIL/uL (ref 3.70–5.45)
RDW: 13 % (ref 11.2–14.5)
WBC: 3.6 10*3/uL — ABNORMAL LOW (ref 3.9–10.3)

## 2017-08-09 MED ORDER — LETROZOLE 2.5 MG PO TABS: 2.5000 mg | ORAL_TABLET | Freq: Every day | ORAL | 3 refills | Status: DC

## 2017-08-09 MED ORDER — DENOSUMAB 60 MG/ML ~~LOC~~ SOLN: 60.0000 mg | Freq: Once | SUBCUTANEOUS | Status: DC

## 2017-08-09 NOTE — Assessment & Plan Note (Signed)
Left lumpectomy 01/29/2016: IDC with DCIS, 1 cm, margins negative, fibrocystic changes,1/2 lymph nodes positive ( intramammary nodes)T1bN1 stage II a  Adjuvant radiation therapy completed 04/29/16  Treatment Plan: adjuvant antiestrogen therapy with Letrozole 2.5 mg daily started 05/07/16 Osteoporosis: On Prolia started 02/04/2017 along with calcium and vitamin D  Letrozole toxicities: Occasional hot flashes but denies any problems  Breast cancer surveillance: 1.  Breast exam 08/09/2017: Benign 2. mammograms   Return to clinic in 6 months for follow-up and Prolia

## 2017-08-09 NOTE — Telephone Encounter (Signed)
Gave patient AVs and calendar of upcoming April 2020 appointments.  °

## 2017-08-09 NOTE — Progress Notes (Signed)
Patient was not arrived to Injection Appointment. Pt's named called out in [place], no family member or patient was found. Will attempt to reschedule for Prolia injection.

## 2017-08-09 NOTE — Progress Notes (Signed)
 Patient Care Team: Mackenzie, Brian, MD as PCP - General (Internal Medicine) Wakefield, Matthew, MD as Consulting Physician (General Surgery) Gudena, Vinay, MD as Consulting Physician (Hematology and Oncology) Kinard, James, MD as Consulting Physician (Radiation Oncology) Causey, Lindsey Cornetto, NP as Nurse Practitioner (Hematology and Oncology)  DIAGNOSIS:  Encounter Diagnosis  Name Primary?  . Malignant neoplasm of upper-outer quadrant of left breast in female, estrogen receptor positive (HCC)     SUMMARY OF ONCOLOGIC HISTORY:   Breast cancer of upper-outer quadrant of left female breast ([facility])   12/26/2015 Initial Diagnosis    Left breast biopsy 1:00: Invasive ductal carcinoma grade 1, DCIS,ER 95%, PR 95%, Ki-67 10%, HER-2 negative ratio 1.12; left breast mass 1:00 position 7 mm size axilla ultrasound negative, T1 BN 0 stage IA clinical stage      01/29/2016 Surgery    Left lumpectomy: IDC with DCIS, 1 cm, margins negative, fibrocystic changes,1/2 lymph nodes positive ( intramammary nodes)T1bN1 stage II a      03/11/2016 - 04/29/2016 Radiation Therapy    Adj XRT (kinard): 1)Left breast/axilla / 50.4 Gy in 28 fractions  2) Left breast boost / 10 Gy in 5 fractions.        05/07/2016 -  Anti-estrogen oral therapy    Letrozole 2.5 mg daily along with Prolia every 6 months       CHIEF COMPLIANT: Follow-up on letrozole therapy  INTERVAL HISTORY: Olivia Gray is a 84-year-old with above-mentioned history of left breast cancer treated with lumpectomy and radiation is currently on letrozole therapy.  Because of osteoporosis was started on Prolia.  She did not tolerate it fairly well.  Does not have any major side effects to letrozole therapy.  Denies any lumps or nodules in the breast  REVIEW OF SYSTEMS:   Constitutional: Denies fevers, chills or abnormal weight loss Eyes: Denies blurriness of vision Ears, nose, mouth, throat, and face: Denies mucositis or sore throat Respiratory:  Denies cough, dyspnea or wheezes Cardiovascular: Denies palpitation, chest discomfort Gastrointestinal:  Denies nausea, heartburn or change in bowel habits Skin: Denies abnormal skin rashes Lymphatics: Denies new lymphadenopathy or easy bruising Neurological:Denies numbness, tingling or new weaknesses Behavioral/Psych: Mood is stable, no new changes  Extremities: No lower extremity edema All other systems were reviewed with the patient and are negative.  I have reviewed the past medical history, past surgical history, social history and family history with the patient and they are unchanged from previous note.  ALLERGIES:  has No Known Allergies.  MEDICATIONS:  Current Outpatient Medications  Medication Sig Dispense Refill  . amLODipine (NORVASC) 10 MG tablet TAKE 1 TABLET BY MOUTH EVERY DAY 30 tablet 1  . aspirin 81 MG tablet Take 81 mg by mouth daily.    . carvedilol (COREG) 3.125 MG tablet TAKE 1 TABLET BY MOUTH TWICE A DAY 60 tablet 4  . cholecalciferol (VITAMIN D) 1000 UNITS tablet Take 1,000 Units by mouth daily.    . cyanocobalamin 1000 MCG tablet Take 100 mcg by mouth daily.    . diclofenac sodium (VOLTAREN) 1 % GEL APPLY TO AFFECTED AREA 4 TIMES A DAY AS NEEDED  5  . diphenhydrAMINE (BENADRYL) 25 MG tablet Take 25 mg by mouth every 6 (six) hours as needed.    . emollient (BIAFINE) cream Apply topically 2 (two) times daily.    . escitalopram (LEXAPRO) 10 MG tablet Take 10 mg by mouth daily.    . ibuprofen (ADVIL,MOTRIN) 100 MG/5ML suspension Take 200 mg by mouth every 4 (  four) hours as needed.    . ipratropium (ATROVENT) 0.06 % nasal spray Place 1 spray into the nose daily.    . irbesartan (AVAPRO) 300 MG tablet TAKE 1 TABLET (300 MG TOTAL) BY MOUTH DAILY. 30 tablet 9  . latanoprost (XALATAN) 0.005 % ophthalmic solution Place 1 drop into both eyes at bedtime.    . letrozole (FEMARA) 2.5 MG tablet Take 1 tablet (2.5 mg total) by mouth daily. 90 tablet 3  . nitroGLYCERIN  (NITROSTAT) 0.4 MG SL tablet Place 1 tablet (0.4 mg total) under the tongue every 5 (five) minutes as needed for chest pain. 25 tablet 6  . non-metallic deodorant (ALRA) MISC Apply 1 application topically daily as needed.    . simvastatin (ZOCOR) 20 MG tablet Take 10 mg by mouth daily.     No current facility-administered medications for this visit.     PHYSICAL EXAMINATION: ECOG PERFORMANCE STATUS: 1 - Symptomatic but completely ambulatory  There were no vitals filed for this visit. There were no vitals filed for this visit.  GENERAL:alert, no distress and comfortable SKIN: skin color, texture, turgor are normal, no rashes or significant lesions EYES: normal, Conjunctiva are pink and non-injected, sclera clear OROPHARYNX:no exudate, no erythema and lips, buccal mucosa, and tongue normal  NECK: supple, thyroid normal size, non-tender, without nodularity LYMPH:  no palpable lymphadenopathy in the cervical, axillary or inguinal LUNGS: clear to auscultation and percussion with normal breathing effort HEART: regular rate & rhythm and no murmurs and no lower extremity edema ABDOMEN:abdomen soft, non-tender and normal bowel sounds MUSCULOSKELETAL:no cyanosis of digits and no clubbing  NEURO: alert & oriented x 3 with fluent speech, no focal motor/sensory deficits EXTREMITIES: No lower extremity edema BREAST: No palpable masses or nodules in either right or left breasts. No palpable axillary supraclavicular or infraclavicular adenopathy no breast tenderness or nipple discharge. (exam performed in the presence of a chaperone)  LABORATORY DATA:  I have reviewed the data as listed CMP Latest Ref Rng & Units 02/04/2017 08/03/2016 01/01/2016  Glucose 70 - 140 mg/dl 98 102 122  BUN 7.0 - 26.0 mg/dL 16.7 11.9 12.0  Creatinine 0.6 - 1.1 mg/dL 0.9 1.0 0.9  Sodium 136 - 145 mEq/L 143 142 142  Potassium 3.5 - 5.1 mEq/L 4.2 4.0 4.0  Chloride 96 - 112 mEq/L - - -  CO2 22 - 29 mEq/L 24 23 20(L)  Calcium  8.4 - 10.4 mg/dL 9.1 9.2 9.4  Total Protein 6.4 - 8.3 g/dL 7.1 7.0 7.6  Total Bilirubin 0.20 - 1.20 mg/dL 0.89 1.33(H) 1.16  Alkaline Phos 40 - 150 U/L 85 82 84  AST 5 - 34 U/L 17 19 17  ALT 0 - 55 U/L 14 14 9    Lab Results  Component Value Date   WBC 4.0 02/04/2017   HGB 11.9 02/04/2017   HCT 36.1 02/04/2017   MCV 91.3 02/04/2017   PLT 166 02/04/2017   NEUTROABS 2.6 02/04/2017    ASSESSMENT & PLAN:  Breast cancer of upper-outer quadrant of left female breast ([facility]) Left lumpectomy 01/29/2016: IDC with DCIS, 1 cm, margins negative, fibrocystic changes,1/2 lymph nodes positive ( intramammary nodes)T1bN1 stage II a  Adjuvant radiation therapy completed 04/29/16  Treatment Plan: adjuvant antiestrogen therapy with Letrozole 2.5 mg daily started 05/07/16 Osteoporosis: On Prolia started 02/04/2017 along with calcium and vitamin D  Letrozole toxicities: Occasional hot flashes but denies any problems  Breast cancer surveillance: 1.  Breast exam 08/09/2017: Benign 2. mammograms   Return   to clinic in 6 months for follow-up and Prolia      No orders of the defined types were placed in this encounter.  The patient has a good understanding of the overall plan. she agrees with it. she will call with any problems that may develop before the next visit here.   Viinay K Gudena, MD 08/09/17    

## 2017-08-17 ENCOUNTER — Ambulatory Visit: Payer: Medicare Other | Admitting: Sports Medicine

## 2017-08-17 ENCOUNTER — Encounter: Payer: Self-pay | Admitting: Sports Medicine

## 2017-08-17 VITALS — BP 122/59 | HR 61 | Resp 16

## 2017-08-17 DIAGNOSIS — I739 Peripheral vascular disease, unspecified: Secondary | ICD-10-CM

## 2017-08-17 DIAGNOSIS — M79675 Pain in left toe(s): Secondary | ICD-10-CM

## 2017-08-17 DIAGNOSIS — L600 Ingrowing nail: Secondary | ICD-10-CM

## 2017-08-17 NOTE — Progress Notes (Signed)
Subjective: Olivia Gray is a 84 y.o. female patient seen today in office with complaint of mildly painful ingrowing left 1st medial hallux nail, unable to trim. Patient denies history of Diabetes or Neuropathy. Patient states that in [place] she used to get nail trimmed every month. Patient has no other pedal complaints at this time.   Review of Systems  All other systems reviewed and are negative.  Patient Active Problem List   Diagnosis Date Noted  . Separation of left acromioclavicular joint, type 5 07/13/2016  . Osteoporosis 05/07/2016  . Breast cancer of upper-outer quadrant of left female breast ([place]) 12/27/2015  . Musculoskeletal chest pain 09/10/2014  . S/P CABG x 4   . Essential hypertension     Class: Chronic  . Hyperlipidemia LDL goal <70   . Mitral valve anterior leaflet prolapse   . Coronary artery disease involving native heart without angina pectoris 05/14/2002    Current Outpatient Medications on File Prior to Visit  Medication Sig Dispense Refill  . amLODipine (NORVASC) 10 MG tablet TAKE 1 TABLET BY MOUTH EVERY DAY 30 tablet 1  . aspirin 81 MG tablet Take 81 mg by mouth daily.    . carvedilol (COREG) 3.125 MG tablet TAKE 1 TABLET BY MOUTH TWICE A DAY 60 tablet 4  . cholecalciferol (VITAMIN D) 1000 UNITS tablet Take 1,000 Units by mouth daily.    . cyanocobalamin 1000 MCG tablet Take 100 mcg by mouth daily.    . diclofenac sodium (VOLTAREN) 1 % GEL APPLY TO AFFECTED AREA 4 TIMES A DAY AS NEEDED  5  . diphenhydrAMINE (BENADRYL) 25 MG tablet Take 25 mg by mouth every 6 (six) hours as needed.    Marland Kitchen emollient (BIAFINE) cream Apply topically 2 (two) times daily.    Marland Kitchen escitalopram (LEXAPRO) 10 MG tablet Take 10 mg by mouth daily.    Marland Kitchen ibuprofen (ADVIL,MOTRIN) 100 MG/5ML suspension Take 200 mg by mouth every 4 (four) hours as needed.    Marland Kitchen ipratropium (ATROVENT) 0.06 % nasal spray Place 1 spray into the nose daily.    . irbesartan (AVAPRO) 300 MG tablet TAKE 1 TABLET (300 MG TOTAL)  BY MOUTH DAILY. 30 tablet 9  . latanoprost (XALATAN) 0.005 % ophthalmic solution Place 1 drop into both eyes at bedtime.    Marland Kitchen letrozole (FEMARA) 2.5 MG tablet Take 1 tablet (2.5 mg total) by mouth daily. 90 tablet 3  . nitroGLYCERIN (NITROSTAT) 0.4 MG SL tablet Place 1 tablet (0.4 mg total) under the tongue every 5 (five) minutes as needed for chest pain. 25 tablet 6  . non-metallic deodorant (ALRA) MISC Apply 1 application topically daily as needed.    . simvastatin (ZOCOR) 20 MG tablet Take 10 mg by mouth daily.     No current facility-administered medications on file prior to visit.     No Known Allergies  Objective: Physical Exam  General: Well developed, nourished, no acute distress, awake, alert and oriented x 3  Vascular: Dorsalis pedis artery 1/4 bilateral, Posterior tibial artery 1/4 bilateral, skin temperature warm to cool proximal to distal bilateral lower extremities, no varicosities,scant pedal hair present bilateral.  Neurological: Gross sensation present via light touch bilateral.   Dermatological: Skin is warm, dry, and supple bilateral, Left hallux nail at medial margin is tender with mild subungal debris and mild incurvation, no webspace macerations present bilateral, no open lesions present bilateral, no callus/corns/hyperkeratotic tissue present bilateral. No signs of infection bilateral.  Musculoskeletal: Asymptomatic bunion and hammertoe boney deformities noted  bilateral. Muscular strength within normal limits without painon range of motion. No pain with calf compression bilateral.  Assessment and Plan:  Problem List Items Addressed This Visit    None    Visit Diagnoses    Ingrown nail    -  Primary   Toe pain, left       PVD (peripheral vascular disease) ([place])          -Examined patient.  -Discussed treatment options for incurvated nail at left 1st toe. -Mechanically debrided and reduced left 1st toenail with sterile nail nipper and dremel nail file  without incident. -Patient to return in 1 month for nail check or sooner if symptoms worsen.  Landis Martins, DPM

## 2017-08-17 NOTE — Patient Instructions (Addendum)
Neosporin to left toenail covered with bandaid during the day

## 2017-09-06 ENCOUNTER — Ambulatory Visit
Admission: RE | Admit: 2017-09-06 | Discharge: 2017-09-06 | Disposition: A | Discharge status: Home / Self Care | Payer: Medicare Other | Source: Ambulatory Visit | Attending: Hematology and Oncology | Admitting: Hematology and Oncology

## 2017-09-06 DIAGNOSIS — Z170 Estrogen receptor positive status [ER+]: Principal | ICD-10-CM

## 2017-09-06 DIAGNOSIS — C50412 Malignant neoplasm of upper-outer quadrant of left female breast: Secondary | ICD-10-CM

## 2017-09-06 HISTORY — DX: Malignant neoplasm of unspecified site of unspecified female breast: C50.919

## 2017-09-06 HISTORY — DX: Personal history of irradiation: Z92.3

## 2017-09-14 ENCOUNTER — Ambulatory Visit: Payer: Medicare Other | Admitting: Sports Medicine

## 2017-09-29 ENCOUNTER — Other Ambulatory Visit: Payer: Self-pay | Admitting: Cardiology

## 2017-10-21 ENCOUNTER — Telehealth: Payer: Self-pay | Admitting: Cardiology

## 2017-10-21 NOTE — Telephone Encounter (Signed)
FORWARD TO [facility] - FOR A REPLACEMENT

## 2017-10-21 NOTE — Telephone Encounter (Signed)
Per pt's husband's call needed to know if they can replace  IRBESARTAN 300 per the pharm this is on back order.  Or call the pharm  [facility] [address] and [address]. they have 4 pills left.   Please give pt a call back.

## 2017-10-22 MED ORDER — VALSARTAN 320 MG PO TABS: 320.0000 mg | ORAL_TABLET | Freq: Every day | ORAL | 1 refills | Status: DC

## 2017-10-22 NOTE — Telephone Encounter (Signed)
Irbesartan back order. Changed to valsartan 320mg .

## 2017-10-25 NOTE — Telephone Encounter (Signed)
SPOKE TO HUSBAND. NEW MEDICATION VALSARTN HAS BEEN STARTED.

## 2017-12-04 IMAGING — CR DG RIBS W/ CHEST 3+V*R*
3 series · 3 of 3 positions shown · non-contrast
Comparison: 07/31/2011 chest radiograph.

CLINICAL DATA: 83 y/o F; status post fall on right side with
right-sided rib pain.

EXAM:
RIGHT RIBS AND CHEST - 3+ VIEW

[chest pa]
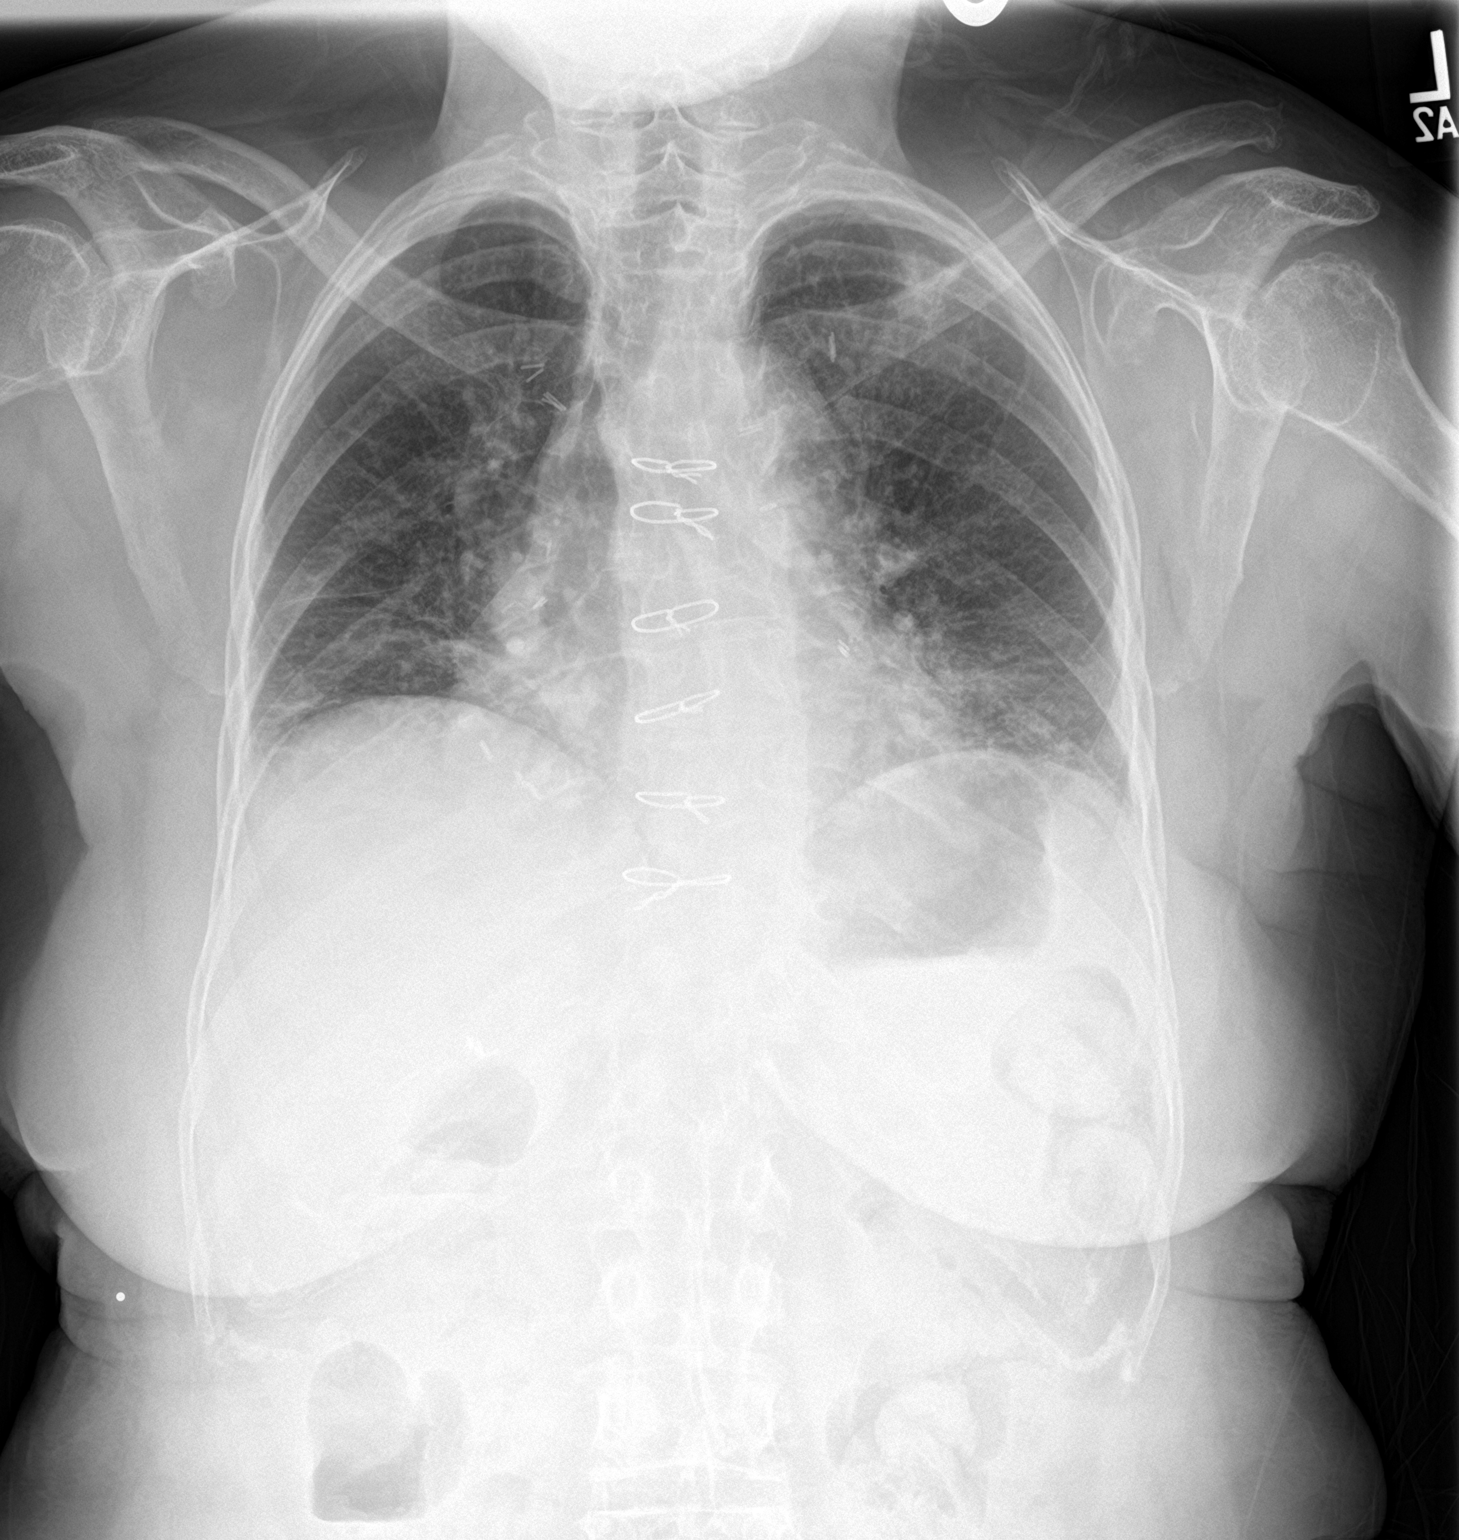

[rib pa]
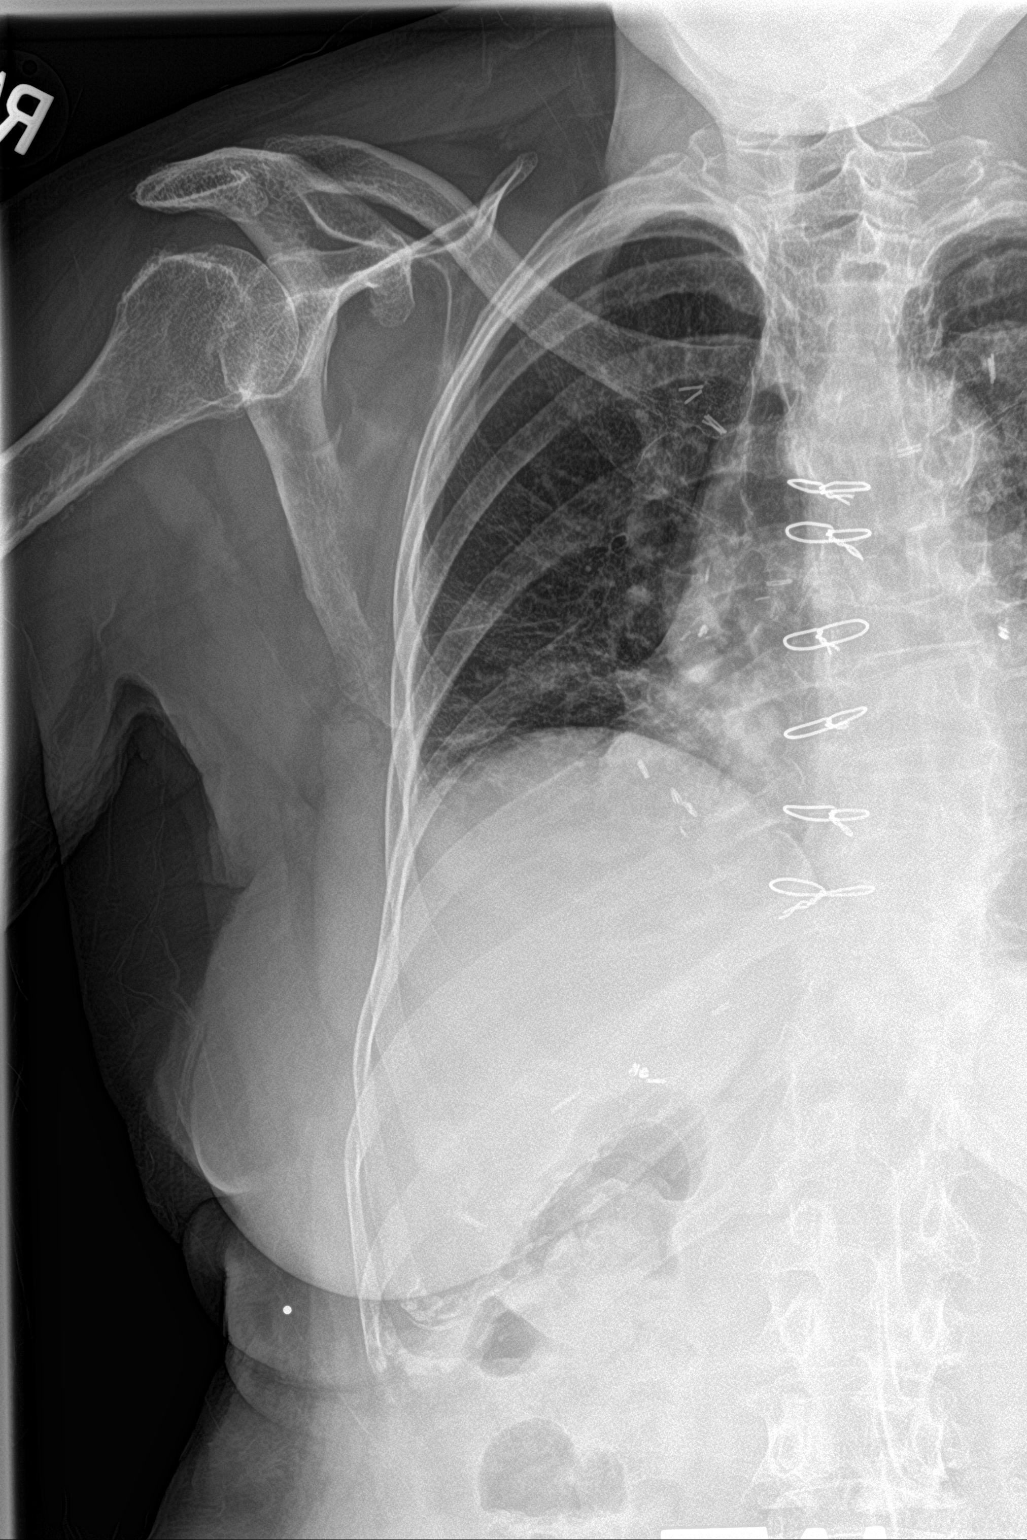

[rib pa obl]
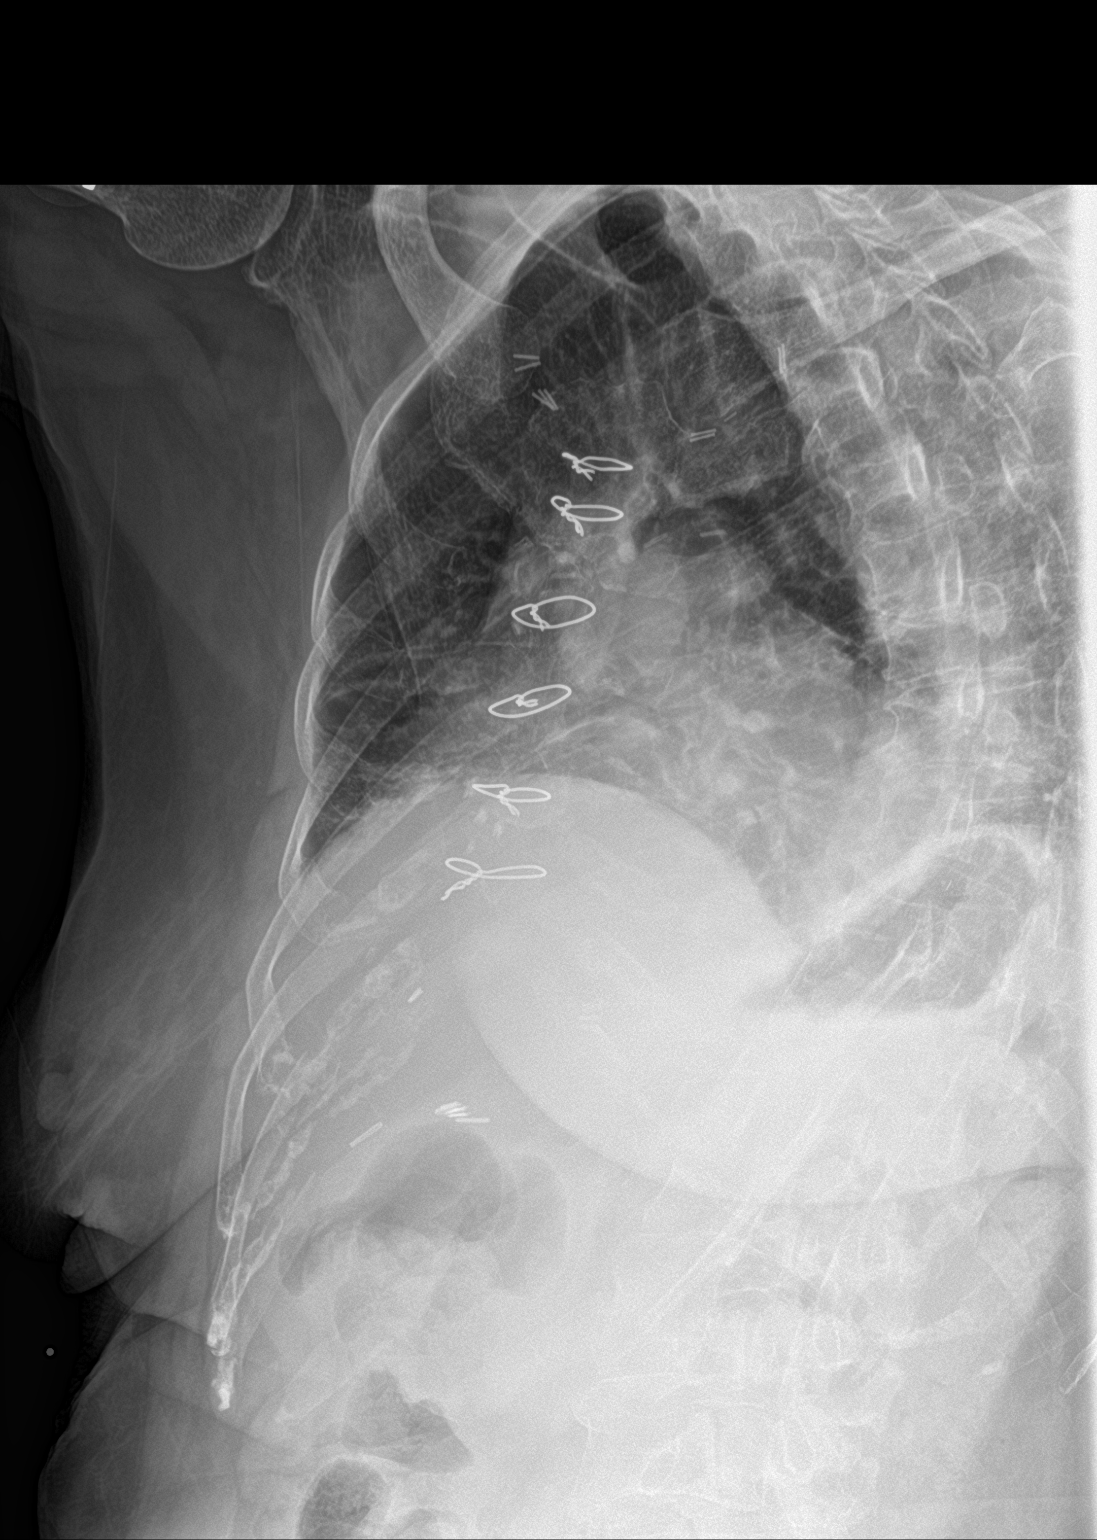

[3 of 3 positions shown; findings below may reference images not displayed]

FINDINGS: Low lung volumes accentuate pulmonary markings. Linear opacities in
lung bases compatible with atelectasis. No pleural effusion. No
pneumothorax. Post median sternotomy with wires in alignment.
Cholecystectomy clips project over right upper quadrant. Right
eighth and ninth anterolateral mildly displaced rib fractures.
IMPRESSION: Right eighth and ninth anterolateral mildly displaced acute rib
fractures.

By: Krushna Pradhan Mospikul M.D.

## 2018-01-21 ENCOUNTER — Telehealth: Payer: Self-pay

## 2018-01-21 ENCOUNTER — Other Ambulatory Visit: Payer: Self-pay | Admitting: Cardiology

## 2018-01-21 NOTE — Telephone Encounter (Signed)
Pt called to report that she had been experiencing some intermittent breast pain, where she had a lumpectomy x 1 week. Pt is taking letrozole and tolerating very well. She denies any breast swelling, infection, skin changes, new lumps, fatigue or fevers. Pt does lay on her left side, quite bit and have been using her left arm more in the past few weeks. Told pt that it could be muscular pain form overuse or from laying on the same side. Scar tissues, hardening at the lumpectomy site or knots may be something that can be expected post surgery. Told pt to monitor closely for the next few weeks and if her pain increases, she will need to call back for a possible breast exam appt.   Advised pt to lay on her other side when sleeping and try to minimize heavy lifting or overuse of the left arm and see if her breast pain improves. Pt verbalized understanding and will call with any further questions or concerns.

## 2018-02-28 DIAGNOSIS — I2510 Atherosclerotic heart disease of native coronary artery without angina pectoris: Secondary | ICD-10-CM | POA: Diagnosis not present

## 2018-02-28 DIAGNOSIS — Z23 Encounter for immunization: Secondary | ICD-10-CM | POA: Diagnosis not present

## 2018-02-28 DIAGNOSIS — I129 Hypertensive chronic kidney disease with stage 1 through stage 4 chronic kidney disease, or unspecified chronic kidney disease: Secondary | ICD-10-CM | POA: Diagnosis not present

## 2018-02-28 DIAGNOSIS — I209 Angina pectoris, unspecified: Secondary | ICD-10-CM | POA: Diagnosis not present

## 2018-02-28 DIAGNOSIS — D709 Neutropenia, unspecified: Secondary | ICD-10-CM | POA: Diagnosis not present

## 2018-02-28 DIAGNOSIS — E785 Hyperlipidemia, unspecified: Secondary | ICD-10-CM | POA: Diagnosis not present

## 2018-03-02 ENCOUNTER — Other Ambulatory Visit: Payer: Self-pay | Admitting: Cardiology

## 2018-03-03 ENCOUNTER — Other Ambulatory Visit: Payer: Self-pay | Admitting: Cardiology

## 2018-03-03 MED ORDER — IRBESARTAN 300 MG PO TABS: ORAL_TABLET | ORAL | 0 refills | Status: DC

## 2018-03-03 MED ORDER — IRBESARTAN 300 MG PO TABS: 300.0000 mg | ORAL_TABLET | Freq: Every day | ORAL | 3 refills | Status: DC

## 2018-03-03 NOTE — Telephone Encounter (Signed)
 *  STAT* If patient is at the pharmacy, call can be transferred to refill team.   1. Which medications need to be refilled? (please list name of each medication and dose if known) irbesartan (AVAPRO) 300 MG tablet  2. Which pharmacy/location (including street and city if local pharmacy) is medication to be sent to? [facility]  3. Do they need a 30 day or 90 day supply? 30 or 90  Irbesartan is on back order so they need an order for valsartan that is back in order

## 2018-03-03 NOTE — Telephone Encounter (Signed)
Medication already sent to pt's pharmacy. Confirmation received.

## 2018-03-03 NOTE — Addendum Note (Signed)
Addended by: Crissie Reese on: 03/03/2018 04:23 PM   Modules accepted: Orders

## 2018-03-08 ENCOUNTER — Telehealth: Payer: Self-pay | Admitting: Cardiology

## 2018-03-08 MED ORDER — VALSARTAN 320 MG PO TABS: 320.0000 mg | ORAL_TABLET | Freq: Every day | ORAL | 0 refills | Status: DC

## 2018-03-08 NOTE — Telephone Encounter (Signed)
Returned call to husband (ok per DPR)- he states irbesartan is on backorder and he was wondering if she needs to switch back to valsartan.    Advised per chart review-patient was switched to valsartan 320 mg in June.   Do not see documentation of being switched back to irbesartan (although multiple refills sent in for this).    Advised to take valsartan as prescribed-called pharmacy to clarify. New rx sent for valsartan as presribed.     Husband verbalized understanding.

## 2018-03-08 NOTE — Telephone Encounter (Signed)
New Message.          Pt c/o medication issue:  1. Name of Medication: Irbesartan 300 mg  2. How are you currently taking this medication (dosage and times per day)? 1 x a day  3. Are you having a reaction (difficulty breathing--STAT)? No   4. What is your medication issue? They are having trouble getting the Irbesartan filled, it  was replaced by Valsartan, so the patient would like to know which should they take? Pls call and advise

## 2018-03-15 DIAGNOSIS — J300 Vasomotor rhinitis: Secondary | ICD-10-CM | POA: Diagnosis not present

## 2018-04-01 ENCOUNTER — Other Ambulatory Visit: Payer: Self-pay | Admitting: Cardiology

## 2018-04-21 ENCOUNTER — Other Ambulatory Visit: Payer: Self-pay | Admitting: Cardiology

## 2018-05-02 DIAGNOSIS — I129 Hypertensive chronic kidney disease with stage 1 through stage 4 chronic kidney disease, or unspecified chronic kidney disease: Secondary | ICD-10-CM | POA: Diagnosis not present

## 2018-06-03 DIAGNOSIS — R0989 Other specified symptoms and signs involving the circulatory and respiratory systems: Secondary | ICD-10-CM | POA: Diagnosis not present

## 2018-06-26 ENCOUNTER — Other Ambulatory Visit: Payer: Self-pay | Admitting: Cardiology

## 2018-06-28 ENCOUNTER — Other Ambulatory Visit: Payer: Self-pay | Admitting: Hematology and Oncology

## 2018-06-28 ENCOUNTER — Other Ambulatory Visit: Payer: Self-pay | Admitting: Cardiology

## 2018-07-12 ENCOUNTER — Telehealth: Payer: Self-pay | Admitting: Hematology and Oncology

## 2018-07-12 NOTE — Telephone Encounter (Signed)
VG PAL 4/13 moved appointment to 4/8. Left message, schedule mailed.

## 2018-08-03 ENCOUNTER — Inpatient Hospital Stay: Payer: Medicare Other | Admitting: Hematology and Oncology

## 2018-08-08 ENCOUNTER — Ambulatory Visit: Payer: Medicare Other | Admitting: Hematology and Oncology

## 2018-08-31 ENCOUNTER — Other Ambulatory Visit: Payer: Self-pay | Admitting: Cardiology

## 2018-08-31 NOTE — Telephone Encounter (Signed)
Carvedilol 3.125 mg refilled. 

## 2018-09-10 ENCOUNTER — Other Ambulatory Visit: Payer: Self-pay | Admitting: Hematology and Oncology

## 2018-10-05 NOTE — Assessment & Plan Note (Deleted)
Left lumpectomy 01/29/2016: IDC with DCIS, 1 cm, margins negative, fibrocystic changes,1/2 lymph nodes positive ( intramammary nodes)T1bN1 stage II a  Adjuvant radiation therapy completed 04/29/16  Treatment Plan: adjuvant antiestrogen therapy with Letrozole 2.5 mg daily started 05/07/16 Osteoporosis: On Prolia started 02/04/2017 along with calcium and vitamin D  Letrozole toxicities: Occasional hot flashes but denies any problems  Breast cancer surveillance: 1.  Breast exam 08/09/2017: Benign 2. mammograms  09/06/2017: Benign, breast density category B  Return to clinic in 6 months for Prolia and in 1 year for follow-up with me

## 2018-10-10 ENCOUNTER — Telehealth: Payer: Self-pay

## 2018-10-10 DIAGNOSIS — N3281 Overactive bladder: Secondary | ICD-10-CM | POA: Diagnosis not present

## 2018-10-10 NOTE — Telephone Encounter (Signed)
RN left voicemail for return call.   

## 2018-10-12 ENCOUNTER — Inpatient Hospital Stay: Payer: Medicare Other | Admitting: Hematology and Oncology

## 2018-10-13 ENCOUNTER — Other Ambulatory Visit: Payer: Self-pay | Admitting: Cardiology

## 2018-10-17 ENCOUNTER — Other Ambulatory Visit: Payer: Self-pay | Admitting: *Deleted

## 2018-10-17 MED ORDER — VALSARTAN 320 MG PO TABS: ORAL_TABLET | ORAL | 1 refills | Status: DC

## 2018-10-18 ENCOUNTER — Telehealth: Payer: Self-pay | Admitting: Cardiology

## 2018-10-18 ENCOUNTER — Telehealth: Payer: Self-pay | Admitting: Hematology and Oncology

## 2018-10-18 MED ORDER — AMLODIPINE BESYLATE 10 MG PO TABS: 10.0000 mg | ORAL_TABLET | Freq: Every day | ORAL | 0 refills | Status: DC

## 2018-10-18 MED ORDER — CARVEDILOL 3.125 MG PO TABS: 3.1250 mg | ORAL_TABLET | Freq: Two times a day (BID) | ORAL | 0 refills | Status: DC

## 2018-10-18 NOTE — Telephone Encounter (Signed)
Spoke to patient -  - aware needs annual appointment. Patient is apprehensive to come to office due to covid 19 . Virtual appt given 11/03/2018 at 3:40    medication refilled for 90 days only.  Instruction given for virtual visit .   TELEPHONE CALL NOTE  This patient has been deemed a candidate for follow-up tele-health visit to limit community exposure during the Covid-19 pandemic. I spoke with the patient via phone to discuss instructions. . The patient will receive a phone call 2-3 days prior to their E-Visit at which time consent will be verbally confirmed.   A Virtual Office Visit appointment type has been scheduled for 11/03/18 with [facility] with "VIDEO"/TEXT       Raiford Simmonds, RN 10/18/2018 12:23 PM

## 2018-10-18 NOTE — Telephone Encounter (Signed)
Scheduled appt per 6/17 sch message - spoke with patient - they are aware of appt date and time

## 2018-10-18 NOTE — Telephone Encounter (Addendum)
New Message           *STAT* If patient is at the pharmacy, call can be transferred to refill team.   1. Which medications need to be refilled? (please list name of each medication and dose if known) Amlodipine/Carvedilol  2. Which pharmacy/location (including street and city if local pharmacy) is medication to be sent to?[facility] [address]  3. Do they need a 30 day or 90 day supply? 90   Patient is asking if she needs to come in before the refills are done? Pls call to advise.

## 2018-10-19 ENCOUNTER — Telehealth: Payer: Self-pay | Admitting: *Deleted

## 2018-10-19 NOTE — Telephone Encounter (Signed)
CALLED PATIENT  AWARE APPOINTMENT CHANGE TO 11/01/18 AT 3 PM PATIENT REQUEST VIRTUAL VISIT

## 2018-10-24 DIAGNOSIS — N952 Postmenopausal atrophic vaginitis: Secondary | ICD-10-CM | POA: Diagnosis not present

## 2018-11-01 ENCOUNTER — Telehealth: Payer: Medicare Other | Admitting: Cardiology

## 2018-11-01 ENCOUNTER — Other Ambulatory Visit: Payer: Self-pay

## 2018-11-03 ENCOUNTER — Telehealth: Payer: Medicare Other | Admitting: Cardiology

## 2018-11-08 DIAGNOSIS — I129 Hypertensive chronic kidney disease with stage 1 through stage 4 chronic kidney disease, or unspecified chronic kidney disease: Secondary | ICD-10-CM | POA: Diagnosis not present

## 2018-11-08 DIAGNOSIS — I2510 Atherosclerotic heart disease of native coronary artery without angina pectoris: Secondary | ICD-10-CM | POA: Diagnosis not present

## 2018-11-08 DIAGNOSIS — M8580 Other specified disorders of bone density and structure, unspecified site: Secondary | ICD-10-CM | POA: Diagnosis not present

## 2018-11-10 ENCOUNTER — Telehealth: Payer: Self-pay | Admitting: Cardiology

## 2018-11-10 NOTE — Telephone Encounter (Signed)

## 2018-11-11 ENCOUNTER — Telehealth: Payer: Self-pay | Admitting: *Deleted

## 2018-11-11 ENCOUNTER — Encounter: Payer: Self-pay | Admitting: Cardiology

## 2018-11-11 ENCOUNTER — Telehealth (INDEPENDENT_AMBULATORY_CARE_PROVIDER_SITE_OTHER): Payer: Medicare Other | Admitting: Cardiology

## 2018-11-11 ENCOUNTER — Other Ambulatory Visit: Payer: Self-pay | Admitting: Cardiology

## 2018-11-11 VITALS — Ht 64.0 in | Wt 144.0 lb

## 2018-11-11 DIAGNOSIS — Z951 Presence of aortocoronary bypass graft: Secondary | ICD-10-CM | POA: Diagnosis not present

## 2018-11-11 DIAGNOSIS — I2510 Atherosclerotic heart disease of native coronary artery without angina pectoris: Secondary | ICD-10-CM | POA: Diagnosis not present

## 2018-11-11 DIAGNOSIS — E785 Hyperlipidemia, unspecified: Secondary | ICD-10-CM

## 2018-11-11 DIAGNOSIS — I10 Essential (primary) hypertension: Secondary | ICD-10-CM | POA: Diagnosis not present

## 2018-11-11 MED ORDER — SIMVASTATIN 20 MG PO TABS: 20.0000 mg | ORAL_TABLET | Freq: Every day | ORAL | 3 refills | Status: DC

## 2018-11-11 MED ORDER — VALSARTAN 320 MG PO TABS: ORAL_TABLET | ORAL | 3 refills | Status: DC

## 2018-11-11 MED ORDER — NITROGLYCERIN 0.4 MG SL SUBL: 0.4000 mg | SUBLINGUAL_TABLET | SUBLINGUAL | 6 refills | Status: DC | PRN

## 2018-11-11 MED ORDER — AMLODIPINE BESYLATE 10 MG PO TABS: ORAL_TABLET | ORAL | 3 refills | Status: DC

## 2018-11-11 MED ORDER — CARVEDILOL 3.125 MG PO TABS: ORAL_TABLET | ORAL | 3 refills | Status: DC

## 2018-11-11 NOTE — Assessment & Plan Note (Signed)
She is now 16 years post CABG doing well.  Based on last few visits with her, the plan was to only do follow-up stress testing if she were to have symptoms.  We will not do routine surveillance stress test.

## 2018-11-11 NOTE — Assessment & Plan Note (Signed)
She states that her blood pressure was okay when she saw her PCP this past month.  She is on low-dose carvedilol, intermediate dose amlodipine and max dose ARB (has been switched from several ARB's and is now back to taking Diovan)

## 2018-11-11 NOTE — Patient Instructions (Addendum)
Medication Instructions:   --We will refill carvedilol and amlodipine , valsartan,  Simvastatin,nitroglycerin sublingual tablets  If you need a refill on your cardiac medications before your next appointment, please call your pharmacy.   Lab work: PLEASE HAVE DR [place] SEND COPY OF LABS  TO OFFICE WHEN YOU  HAVE YOUR PHYSICAL.   If you have labs (blood work) drawn today and your tests are completely normal, you will receive your results only by: Marland Kitchen MyChart Message (if you have MyChart) OR . A paper copy in the mail If you have any lab test that is abnormal or we need to change your treatment, we will call you to review the results.  Testing/Procedures: NOT NEEDED  Follow-Up: At [facility], you and your health needs are our priority.  As part of our continuing mission to provide you with exceptional heart care, we have created designated Provider Care Teams.  These Care Teams include your primary Cardiologist (physician) and Advanced Practice Providers (APPs -  Physician Assistants and Nurse Practitioners) who all work together to provide you with the care you need, when you need it. . You will need a follow up appointment in 12 months - July 2021.  Please call our office 2 months in advance to schedule this appointment.  You may see Glenetta Hew, MD or one of the following Advanced Practice Providers on your designated Care Team:   . Rosaria Ferries, PA-C . Jory Sims, DNP, ANP  Any Other Special Instructions Will Be Listed Below (If Applicable).

## 2018-11-11 NOTE — Progress Notes (Signed)
Virtual Visit via Telephone Note   This visit type was conducted due to national recommendations for restrictions regarding the COVID-19 Pandemic (e.g. social distancing) in an effort to limit this patient's exposure and mitigate transmission in our community.  Due to her co-morbid illnesses, this patient is at least at moderate risk for complications without adequate follow up.  This format is felt to be most appropriate for this patient at this time.  The patient did not have access to video technology/had technical difficulties with video requiring transitioning to audio format only (telephone).  All issues noted in this document were discussed and addressed.  No physical exam could be performed with this format.  Please refer to the patient's chart for her  consent to telehealth for [facility].   Patient has given verbal permission to conduct this visit via virtual appointment and to bill insurance 11/11/2018 2:35 PM     Evaluation Performed:  Follow-up visit  Date:  11/11/2018   ID:  Olivia Gray, DOB 15-Dec-1932, MRN [id number]  Patient Location: Home Provider Location: Other:  Hospital office  PCP:  Jani Gravel, MD  Cardiologist:  Glenetta Hew, MD Electrophysiologist:  None   Chief Complaint: Long overdue follow-up  History of Present Illness:    Olivia Gray is a 85 y.o. female with PMH notable for CAD-CABG (last Myoview in 2016) who presents via audio/video conferencing for a telehealth visit today.  Olivia Gray was last seen back in January 2019.  At that time she was still undergoing chemotherapy following her left breast cancer lumpectomy.  She was doing well from a cardiac standpoint still doing calisthenics etc.  Mostly limited because of her blindness.  Interval History:  Remains somewhat limited by vision issues.  Does some housework.  Still does her exercises -- calisthenics & light weights, situps/ pushups -- etc.   Cardiovascular ROS: no chest pain or dyspnea on  exertion negative for - edema, irregular heartbeat, loss of consciousness, orthopnea, palpitations, paroxysmal nocturnal dyspnea, rapid heart rate, shortness of breath or TIA/CVA Sx   The patient does not have symptoms concerning for COVID-19 infection (fever, chills, cough, or new shortness of breath).  The patient is practicing social distancing. Husband dose the shopping   ROS:  Please see the history of present illness.    Review of Systems  Constitutional: Negative for fever, malaise/fatigue and weight loss.  HENT: Negative for congestion and nosebleeds.        Chronic rhinorrhea  Eyes:       Stable vision issues  Respiratory: Negative for cough and shortness of breath.   Cardiovascular: Negative for claudication and leg swelling.  Gastrointestinal: Negative for blood in stool and melena.  Genitourinary: Positive for frequency and urgency. Negative for hematuria.  Musculoskeletal: Positive for joint pain (hips - but tolerable). Negative for myalgias.  Neurological: Negative for dizziness, focal weakness, weakness and headaches.  Psychiatric/Behavioral: Negative for memory loss. The patient has insomnia (more due to Nocturia). The patient is not nervous/anxious.      Past Medical History:  Diagnosis Date  . Breast cancer ([place]) 01/29/2016   Left Breast Cancer  . Breast cancer of upper-outer quadrant of left female breast ([place]) 12/27/2015  . CAD, multiple vessel 2004   Myoview 2011: Mild Basal-mid Inferolateral "ischemia", EF ~76?% -- Anormal, but LOW RISK  . Essential hypertension   . History of radiation therapy 03/11/16-04/29/16   left breast/axilla 50.4 Gy in 28 fractions, boost of 10 Gy in 5 fractions  .  Hyperlipidemia LDL goal <70    On statin.  . Legally blind   . Mitral valve prolapse 02/07/2008; April 2015   a) Mild Anterior MVP, with mild MR.;; b) Echo 07/2013: EF 55-60%, Gr 1 DD, no WMA, Mild central MR with late systolic prolapse   . Personal history of radiation  therapy 2017   Left Breast Cancer  . Pre-diabetes   . Retinitis pigmentosa of both eyes     now essentially legally blind.  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 S/P CABG x 4 2004   SVG-LAD, SVG-D1, SVG-OM1, SVG-RPDA. (Native LIMA was 100% occluded)    Past Surgical History:  Procedure Laterality Date  . BREAST LUMPECTOMY Left 01/29/2016  . BREAST LUMPECTOMY WITH RADIOACTIVE SEED LOCALIZATION Left 01/29/2016   Procedure: LEFT BREAST LUMPECTOMY WITH RADIOACTIVE SEED LOCALIZATION;  Surgeon: [name], MD;  Location: [facility];  Service: General;  Laterality: Left;  . BREAST SURGERY Bilateral    cyst removal  . CARDIAC CATHETERIZATION  05/22/2009   all grafts patent ,LV systolic fx normal,small -vessel diabetic disease in native vessels   . CHOLECYSTECTOMY  02/05/2005  . CORONARY ARTERY BYPASS GRAFT  07/21/2002   in [place] ; SVG-LAD, SVG-D1, SVG-OM1, SVG RPDA.  . DOPPLER ECHOCARDIOGRAPHY  02/07/2008; April 2015   EF=>55%; LV normal; mild anterior MVP with mild MR.; b) 07/2013: EF 55-60%. Normal wall motion. Gr 1 DD, mild MR with late systolic prolapse. Normal central venous and pulmonary arterial pressures   . NM MYOCAR PERF WALL MOTION  04/2009   EF 76%; mild ischeia basal inferolateral,mid inferoinlateral regions(s), abn pharmacologic nuc test, deffect indicte new ischemia,LV syst.fx normal --> cath showing no obstructive targets.  Marland Kitchen NM MYOVIEW LTD  June 2016   Normal perfusion. Normal study. LOW RISK. Normal EF (55%). Compared to prior study, no ischemic changes present.     Current Meds  Medication Sig  . amLODipine (NORVASC) 10 MG tablet TAKE 1 TABLET (10 MG TOTAL) BY MOUTH DAILY.  Marland Kitchen aspirin 81 MG tablet Take 81 mg by mouth daily.  . carvedilol (COREG) 3.125 MG tablet TAKE 1 TABLET (3.125 MG TOTAL) BY MOUTH 2 (TWO) TIMES DAILY.  .  cholecalciferol (VITAMIN D) 1000 UNITS tablet Take 1,000 Units by mouth daily.  . cyanocobalamin 1000 MCG tablet Take 100 mcg by mouth daily.  . diclofenac sodium (VOLTAREN) 1 % GEL APPLY TO AFFECTED AREA 4 TIMES A DAY AS NEEDED  . escitalopram (LEXAPRO) 10 MG tablet Take 10 mg by mouth daily.  Marland Kitchen ipratropium (ATROVENT) 0.06 % nasal spray Place 1 spray into the nose daily.  Marland Kitchen latanoprost (XALATAN) 0.005 % ophthalmic solution Place 1 drop into both eyes at bedtime.  Marland Kitchen letrozole (FEMARA) 2.5 MG tablet TAKE 1 TABLET BY MOUTH EVERY DAY  . nitroGLYCERIN (NITROSTAT) 0.4 MG SL tablet Place 1 tablet (0.4 mg total) under the tongue every 5 (five) minutes as needed for chest pain.     Allergies:   Patient has no known allergies.   Social History   Tobacco Use  . Smoking status: Never Smoker  . Smokeless tobacco: Never Used  Substance Use Topics  . Alcohol use: No  . Drug use: No     Family Hx: The patient's family history includes Breast cancer in her sister and sister.   Prior CV studies:   The following studies were  reviewed today: . None  Labs/Other Tests and Data Reviewed:    EKG:  No ECG reviewed.  Recent Labs: Labs followed by PCP.  Not available. Due to have annual labs in a few weeks. No results found for requested labs within last 8760 hours.   Recent Lipid Panel No results found for: CHOL, TRIG, HDL, CHOLHDL, LDLCALC, LDLDIRECT  Wt Readings from Last 3 Encounters:  11/11/18 144 lb (65.3 kg)  08/09/17 146 lb 14.4 oz (66.6 kg)  05/24/17 146 lb 12.8 oz (66.6 kg)     Objective:    Vital Signs:  Ht 5\' 4"  (1.626 m)   Wt 144 lb (65.3 kg)   BMI 24.72 kg/m   VITAL SIGNS:  reviewed GEN:  no acute distress RESPIRATORY:  non-labored NEURO:  alert and oriented x 3, no obvious focal deficit PSYCH:  normal affect   ASSESSMENT & PLAN:    Problem List Items Addressed This Visit    S/P CABG x 4 (Chronic)    She is now 16 years post CABG doing well.  Based on last few  visits with her, the plan was to only do follow-up stress testing if she were to have symptoms.  We will not do routine surveillance stress test.      Hyperlipidemia LDL goal <70 (Chronic)    Unfortunately, I do not have labs available.  She is on reduced dose of simvastatin because of being on amlodipine.  Would like to see her target LDL less than 70 if not less than 50, but this may require switching to a different statin in order to allow continued use of calcium channel blocker.      Relevant Medications   carvedilol (COREG) 3.125 MG tablet   amLODipine (NORVASC) 10 MG tablet   valsartan (DIOVAN) 320 MG tablet   nitroGLYCERIN (NITROSTAT) 0.4 MG SL tablet   simvastatin (ZOCOR) 20 MG tablet   Essential hypertension (Chronic)    She states that her blood pressure was okay when she saw her PCP this past month.  She is on low-dose carvedilol, intermediate dose amlodipine and max dose ARB (has been switched from several ARB's and is now back to taking Diovan)      Relevant Medications   carvedilol (COREG) 3.125 MG tablet   amLODipine (NORVASC) 10 MG tablet   valsartan (DIOVAN) 320 MG tablet   nitroGLYCERIN (NITROSTAT) 0.4 MG SL tablet   simvastatin (ZOCOR) 20 MG tablet   Coronary artery disease involving native heart without angina pectoris - Primary (Chronic)    Doing well with no active anginal symptoms.  Had bypass surgery back in 2004 with most recent Myoview in 2016 being negative for ischemia. Remains on aspirin, statin as well as beta-blocker and ARB with no symptoms.  In the absence of any ongoing symptoms, with her being as active as she is, I think we will simply continue her medications and hold off on doing any further testing      Relevant Medications   carvedilol (COREG) 3.125 MG tablet   amLODipine (NORVASC) 10 MG tablet   valsartan (DIOVAN) 320 MG tablet   nitroGLYCERIN (NITROSTAT) 0.4 MG SL tablet   simvastatin (ZOCOR) 20 MG tablet      COVID-19 Education: The  signs and symptoms of COVID-19 were discussed with the patient and how to seek care for testing (follow up with PCP or arrange E-visit).   The importance of social distancing was discussed today.  Time:   Today, I have spent 12  minutes with the patient with telehealth technology discussing the above problems.     Medication Adjustments/Labs and Tests Ordered: Current medicines are reviewed at length with the patient today.  Concerns regarding medicines are outlined above.  Medication Instructions:   No changes to medications, we will refill amlodipine and carvedilol  Tests Ordered: No orders of the defined types were placed in this encounter.  --We will try to get labs that Juergen to be having done soon at your PCP office forwarded to [place].  Medication Changes: Meds ordered this encounter  Medications  . carvedilol (COREG) 3.125 MG tablet    Sig: TAKE 1 TABLET (3.125 MG TOTAL) BY MOUTH 2 (TWO) TIMES DAILY.    Dispense:  180 tablet    Refill:  3  . amLODipine (NORVASC) 10 MG tablet    Sig: TAKE 1 TABLET (10 MG TOTAL) BY MOUTH DAILY.    Dispense:  90 tablet    Refill:  3  . valsartan (DIOVAN) 320 MG tablet    Sig: TAKE 1 TABLET DAILY. TO REPLACE IRBESARTAN.    Dispense:  90 tablet    Refill:  3  . nitroGLYCERIN (NITROSTAT) 0.4 MG SL tablet    Sig: Place 1 tablet (0.4 mg total) under the tongue every 5 (five) minutes as needed for chest pain.    Dispense:  25 tablet    Refill:  6  . simvastatin (ZOCOR) 20 MG tablet    Sig: Take 1 tablet (20 mg total) by mouth at bedtime.    Dispense:  90 tablet    Refill:  3    Disposition:  Follow up in 1 year(s)    Signed, Glenetta Hew, MD  11/11/2018 2:35 PM    [place]

## 2018-11-11 NOTE — Assessment & Plan Note (Signed)
Doing well with no active anginal symptoms.  Had bypass surgery back in 2004 with most recent Myoview in 2016 being negative for ischemia. Remains on aspirin, statin as well as beta-blocker and ARB with no symptoms.  In the absence of any ongoing symptoms, with her being as active as she is, I think we will simply continue her medications and hold off on doing any further testing

## 2018-11-11 NOTE — Assessment & Plan Note (Signed)
Unfortunately, I do not have labs available.  She is on reduced dose of simvastatin because of being on amlodipine.  Would like to see her target LDL less than 70 if not less than 50, but this may require switching to a different statin in order to allow continued use of calcium channel blocker.

## 2018-11-11 NOTE — Telephone Encounter (Signed)
Spoke to patient and husband - instruction given from tele-visit 11/11/18 . avs summary  Mailed and RX sent mail order as husband requested. Both verbalized understanding

## 2018-12-05 DIAGNOSIS — J301 Allergic rhinitis due to pollen: Secondary | ICD-10-CM | POA: Diagnosis not present

## 2018-12-05 DIAGNOSIS — J300 Vasomotor rhinitis: Secondary | ICD-10-CM | POA: Diagnosis not present

## 2018-12-05 DIAGNOSIS — R0982 Postnasal drip: Secondary | ICD-10-CM | POA: Diagnosis not present

## 2018-12-05 DIAGNOSIS — R05 Cough: Secondary | ICD-10-CM | POA: Diagnosis not present

## 2018-12-10 ENCOUNTER — Other Ambulatory Visit: Payer: Self-pay | Admitting: Hematology and Oncology

## 2018-12-21 NOTE — Assessment & Plan Note (Signed)
Left lumpectomy 01/29/2016: IDC with DCIS, 1 cm, margins negative, fibrocystic changes,1/2 lymph nodes positive ( intramammary nodes)T1bN1 stage II a  Adjuvant radiation therapy completed 04/29/16  Treatment Plan: adjuvant antiestrogen therapy with Letrozole 2.5 mg daily started 05/07/16 Osteoporosis: On Prolia started 02/04/2017 along with calcium and vitamin D.  Patient has not received any further injections of Prolia since then.  Letrozole toxicities: Occasional hot flashes but denies any problems  Breast cancer surveillance: 1.  Breast exam 08/09/2017: Benign 2. mammograms  09/06/2017: Benign breast density category B Patient needs a new mammogram.  Return to clinic in 6 months for follow-up and Prolia

## 2018-12-26 ENCOUNTER — Other Ambulatory Visit: Payer: Self-pay

## 2018-12-26 DIAGNOSIS — C50412 Malignant neoplasm of upper-outer quadrant of left female breast: Secondary | ICD-10-CM

## 2018-12-26 NOTE — Progress Notes (Signed)
Patient Care Team: Jani Gravel, MD as PCP - General (Internal Medicine) [name], MD as Consulting Physician (General Surgery) Nicholas Lose, MD as Consulting Physician (Hematology and Oncology) Gery Pray, MD as Consulting Physician (Radiation Oncology) Gardenia Phlegm, NP as Nurse Practitioner (Hematology and Oncology)  DIAGNOSIS:    ICD-10-CM   1. Malignant neoplasm of upper-outer quadrant of left breast in female, estrogen receptor positive ([place])  C50.412    Z17.0     SUMMARY OF ONCOLOGIC HISTORY: Oncology History  Breast cancer of upper-outer quadrant of left female breast ([place])  12/26/2015 Initial Diagnosis   Left breast biopsy 1:00: Invasive ductal carcinoma grade 1, DCIS,ER 95%, PR 95%, Ki-67 10%, HER-2 negative ratio 1.12; left breast mass 1:00 position 7 mm size axilla ultrasound negative, T1 BN 0 stage IA clinical stage   01/29/2016 Surgery   Left lumpectomy: IDC with DCIS, 1 cm, margins negative, fibrocystic changes,1/2 lymph nodes positive ( intramammary nodes)T1bN1 stage II a   03/11/2016 - 04/29/2016 Radiation Therapy   Adj XRT ([place]): 1)Left breast/axilla / 50.4 Gy in 28 fractions  2) Left breast boost / 10 Gy in 5 fractions.     05/07/2016 -  Anti-estrogen oral therapy   Letrozole 2.5 mg daily along with Prolia every 6 months     CHIEF COMPLIANT: Follow-up of left breast cancer on letrozole therapy  INTERVAL HISTORY: Olivia Gray is a 85 y.o. with above-mentioned history of left breast cancer treated with lumpectomy, radiation, and who is currently on letrozole therapy. I last saw her over a year ago. Mammogram on 09/07/18 showed no evidence of malignancy. She presents to the clinic today for annual follow-up.  She has been tolerating letrozole extremely well.  She does not have any hot flashes.  She denies any myalgias.  REVIEW OF SYSTEMS:   Constitutional: Denies fevers, chills or abnormal weight loss Eyes: Blind Ears, nose, mouth, throat,  and face: Denies mucositis or sore throat Respiratory: Denies cough, dyspnea or wheezes Cardiovascular: Denies palpitation, chest discomfort Gastrointestinal: Denies nausea, heartburn or change in bowel habits Skin: Denies abnormal skin rashes Lymphatics: Denies new lymphadenopathy or easy bruising Neurological: Denies numbness, tingling or new weaknesses Behavioral/Psych: Mood is stable, no new changes  Extremities: No lower extremity edema Breast: denies any pain or lumps or nodules in either breasts All other systems were reviewed with the patient and are negative.  I have reviewed the past medical history, past surgical history, social history and family history with the patient and they are unchanged from previous note.  ALLERGIES:  has No Known Allergies.  MEDICATIONS:  Current Outpatient Medications  Medication Sig Dispense Refill  . amLODipine (NORVASC) 10 MG tablet TAKE 1 TABLET (10 MG TOTAL) BY MOUTH DAILY. 90 tablet 3  . aspirin 81 MG tablet Take 81 mg by mouth daily.    . carvedilol (COREG) 3.125 MG tablet TAKE 1 TABLET (3.125 MG TOTAL) BY MOUTH 2 (TWO) TIMES DAILY. 180 tablet 3  . cholecalciferol (VITAMIN D) 1000 UNITS tablet Take 1,000 Units by mouth daily.    . cyanocobalamin 1000 MCG tablet Take 100 mcg by mouth daily.    . diclofenac sodium (VOLTAREN) 1 % GEL APPLY TO AFFECTED AREA 4 TIMES A DAY AS NEEDED  5  . escitalopram (LEXAPRO) 10 MG tablet Take 10 mg by mouth daily.    Marland Kitchen ipratropium (ATROVENT) 0.06 % nasal spray Place 1 spray into the nose daily.    Marland Kitchen latanoprost (XALATAN) 0.005 % ophthalmic solution Place 1  drop into both eyes at bedtime.    Marland Kitchen letrozole (FEMARA) 2.5 MG tablet TAKE 1 TABLET BY MOUTH EVERY DAY 30 tablet 0  . nitroGLYCERIN (NITROSTAT) 0.4 MG SL tablet Place 1 tablet (0.4 mg total) under the tongue every 5 (five) minutes as needed for chest pain. 25 tablet 6  . simvastatin (ZOCOR) 20 MG tablet Take 1 tablet (20 mg total) by mouth at bedtime. 90  tablet 3  . valsartan (DIOVAN) 320 MG tablet TAKE 1 TABLET DAILY. TO REPLACE IRBESARTAN. 90 tablet 3   No current facility-administered medications for this visit.     PHYSICAL EXAMINATION: ECOG PERFORMANCE STATUS: 1 - Symptomatic but completely ambulatory  Vitals:   12/27/18 1454  BP: (!) 134/57  Pulse: (!) 59  Resp: 17  Temp: 98.7 F (37.1 C)  SpO2: 99%   Filed Weights   12/27/18 1454  Weight: 141 lb 4.8 oz (64.1 kg)    GENERAL: alert, no distress and comfortable SKIN: skin color, texture, turgor are normal, no rashes or significant lesions EYES: Blindness OROPHARYNX: no exudate, no erythema and lips, buccal mucosa, and tongue normal  NECK: supple, thyroid normal size, non-tender, without nodularity LYMPH: no palpable lymphadenopathy in the cervical, axillary or inguinal LUNGS: clear to auscultation and percussion with normal breathing effort HEART: regular rate & rhythm and no murmurs and no lower extremity edema ABDOMEN: abdomen soft, non-tender and normal bowel sounds MUSCULOSKELETAL: no cyanosis of digits and no clubbing  NEURO: alert & oriented x 3 with fluent speech, no focal motor/sensory deficits EXTREMITIES: No lower extremity edema BREAST: No palpable masses or nodules in either right or left breasts.  Scar tissue from prior surgery and radiation are noted.  No palpable axillary supraclavicular or infraclavicular adenopathy no breast tenderness or nipple discharge. (exam performed in the presence of a chaperone)  LABORATORY DATA:  I have reviewed the data as listed CMP Latest Ref Rng & Units 08/09/2017 02/04/2017 08/03/2016  Glucose 70 - 140 mg/dL 90 98 102  BUN 7 - 26 mg/dL 11 16.7 11.9  Creatinine 0.60 - 1.10 mg/dL 0.93 0.9 1.0  Sodium 136 - 145 mmol/L 141 143 142  Potassium 3.5 - 5.1 mmol/L 4.1 4.2 4.0  Chloride 98 - 109 mmol/L 111(H) - -  CO2 22 - 29 mmol/L '24 24 23  ' Calcium 8.4 - 10.4 mg/dL 9.6 9.1 9.2  Total Protein 6.4 - 8.3 g/dL 7.1 7.1 7.0  Total  Bilirubin 0.2 - 1.2 mg/dL 1.1 0.89 1.33(H)  Alkaline Phos 40 - 150 U/L 62 85 82  AST 5 - 34 U/L '17 17 19  ' ALT 0 - 55 U/L '12 14 14    ' Lab Results  Component Value Date   WBC 3.7 (L) 12/27/2018   HGB 12.3 12/27/2018   HCT 37.4 12/27/2018   MCV 91.0 12/27/2018   PLT 176 12/27/2018   NEUTROABS 2.2 12/27/2018    ASSESSMENT & PLAN:  Breast cancer of upper-outer quadrant of left female breast ([place]) Left lumpectomy 01/29/2016: IDC with DCIS, 1 cm, margins negative, fibrocystic changes,1/2 lymph nodes positive ( intramammary nodes)T1bN1 stage II a  Adjuvant radiation therapy completed 04/29/16  Treatment Plan: adjuvant antiestrogen therapy with Letrozole 2.5 mg daily started 05/07/16 Osteoporosis: On Prolia started 02/04/2017 along with calcium and vitamin D.  Patient has not received any further injections of Prolia since then. She does not wish to receive any further Prolia.  Letrozole toxicities: Occasional hot flashes but denies any problems  Breast cancer surveillance: 1.  Breast exam 08/09/2017: Benign 2. mammograms  09/06/2017: Benign breast density category B Patient needs a new mammogram.  Return to clinic in 1 year for follow-up    No orders of the defined types were placed in this encounter.  The patient has a good understanding of the overall plan. she agrees with it. she will call with any problems that may develop before the next visit here.  Nicholas Lose, MD 12/27/2018  Julious Oka Dorshimer am acting as scribe for Dr. Nicholas Lose.  I have reviewed the above documentation for accuracy and completeness, and I agree with the above.

## 2018-12-27 ENCOUNTER — Inpatient Hospital Stay: Payer: Medicare Other | Attending: Hematology and Oncology

## 2018-12-27 ENCOUNTER — Other Ambulatory Visit: Payer: Self-pay

## 2018-12-27 ENCOUNTER — Inpatient Hospital Stay: Payer: Medicare Other

## 2018-12-27 ENCOUNTER — Inpatient Hospital Stay (HOSPITAL_BASED_OUTPATIENT_CLINIC_OR_DEPARTMENT_OTHER): Payer: Medicare Other | Admitting: Hematology and Oncology

## 2018-12-27 DIAGNOSIS — C50412 Malignant neoplasm of upper-outer quadrant of left female breast: Secondary | ICD-10-CM

## 2018-12-27 DIAGNOSIS — Z170 Estrogen receptor positive status [ER+]: Secondary | ICD-10-CM

## 2018-12-27 DIAGNOSIS — Z79811 Long term (current) use of aromatase inhibitors: Secondary | ICD-10-CM | POA: Insufficient documentation

## 2018-12-27 DIAGNOSIS — M818 Other osteoporosis without current pathological fracture: Secondary | ICD-10-CM | POA: Diagnosis not present

## 2018-12-27 DIAGNOSIS — Z79899 Other long term (current) drug therapy: Secondary | ICD-10-CM | POA: Diagnosis not present

## 2018-12-27 DIAGNOSIS — Z923 Personal history of irradiation: Secondary | ICD-10-CM | POA: Diagnosis not present

## 2018-12-27 LAB — CBC WITH DIFFERENTIAL (CANCER CENTER ONLY)
Abs Immature Granulocytes: 0.01 10*3/uL (ref 0.00–0.07)
Basophils Absolute: 0 10*3/uL (ref 0.0–0.1)
Basophils Relative: 1 %
Eosinophils Absolute: 0.1 10*3/uL (ref 0.0–0.5)
Eosinophils Relative: 2 %
HCT: 37.4 % (ref 36.0–46.0)
Hemoglobin: 12.3 g/dL (ref 12.0–15.0)
Immature Granulocytes: 0 %
Lymphocytes Relative: 24 %
Lymphs Abs: 0.9 10*3/uL (ref 0.7–4.0)
MCH: 29.9 pg (ref 26.0–34.0)
MCHC: 32.9 g/dL (ref 30.0–36.0)
MCV: 91 fL (ref 80.0–100.0)
Monocytes Absolute: 0.5 10*3/uL (ref 0.1–1.0)
Monocytes Relative: 14 %
Neutro Abs: 2.2 10*3/uL (ref 1.7–7.7)
Neutrophils Relative %: 59 %
Platelet Count: 176 10*3/uL (ref 150–400)
RBC: 4.11 MIL/uL (ref 3.87–5.11)
RDW: 12.6 % (ref 11.5–15.5)
WBC Count: 3.7 10*3/uL — ABNORMAL LOW (ref 4.0–10.5)
nRBC: 0 % (ref 0.0–0.2)

## 2018-12-27 LAB — CMP (CANCER CENTER ONLY)
ALT: 13 U/L (ref 0–44)
AST: 20 U/L (ref 15–41)
Albumin: 3.8 g/dL (ref 3.5–5.0)
Alkaline Phosphatase: 71 U/L (ref 38–126)
Anion gap: 10 (ref 5–15)
BUN: 15 mg/dL (ref 8–23)
CO2: 23 mmol/L (ref 22–32)
Calcium: 9.4 mg/dL (ref 8.9–10.3)
Chloride: 109 mmol/L (ref 98–111)
Creatinine: 1.06 mg/dL — ABNORMAL HIGH (ref 0.44–1.00)
GFR, Est AFR Am: 56 mL/min — ABNORMAL LOW (ref >60)
GFR, Estimated: 49 mL/min — ABNORMAL LOW (ref >60)
Glucose, Bld: 97 mg/dL (ref 70–99)
Potassium: 4.4 mmol/L (ref 3.5–5.1)
Sodium: 142 mmol/L (ref 135–145)
Total Bilirubin: 1.1 mg/dL (ref 0.3–1.2)
Total Protein: 7.2 g/dL (ref 6.5–8.1)

## 2018-12-27 MED ORDER — LETROZOLE 2.5 MG PO TABS: 2.5000 mg | ORAL_TABLET | Freq: Every day | ORAL | 3 refills | Status: DC

## 2018-12-28 ENCOUNTER — Telehealth: Payer: Self-pay | Admitting: Hematology and Oncology

## 2018-12-28 NOTE — Telephone Encounter (Signed)
I talk with patients husband regarding schedule  °

## 2019-01-09 ENCOUNTER — Other Ambulatory Visit: Payer: Self-pay | Admitting: Hematology and Oncology

## 2019-01-23 DIAGNOSIS — S40011A Contusion of right shoulder, initial encounter: Secondary | ICD-10-CM | POA: Diagnosis not present

## 2019-01-23 DIAGNOSIS — M25511 Pain in right shoulder: Secondary | ICD-10-CM | POA: Diagnosis not present

## 2019-01-23 DIAGNOSIS — S335XXA Sprain of ligaments of lumbar spine, initial encounter: Secondary | ICD-10-CM | POA: Diagnosis not present

## 2019-01-23 DIAGNOSIS — S4991XA Unspecified injury of right shoulder and upper arm, initial encounter: Secondary | ICD-10-CM | POA: Diagnosis not present

## 2019-01-23 DIAGNOSIS — M25531 Pain in right wrist: Secondary | ICD-10-CM | POA: Diagnosis not present

## 2019-01-23 DIAGNOSIS — M545 Low back pain: Secondary | ICD-10-CM | POA: Diagnosis not present

## 2019-01-23 DIAGNOSIS — M549 Dorsalgia, unspecified: Secondary | ICD-10-CM | POA: Diagnosis not present

## 2019-01-23 DIAGNOSIS — M898X1 Other specified disorders of bone, shoulder: Secondary | ICD-10-CM | POA: Diagnosis not present

## 2019-01-23 DIAGNOSIS — S3992XA Unspecified injury of lower back, initial encounter: Secondary | ICD-10-CM | POA: Diagnosis not present

## 2019-01-23 DIAGNOSIS — R51 Headache: Secondary | ICD-10-CM | POA: Diagnosis not present

## 2019-01-23 DIAGNOSIS — S52501A Unspecified fracture of the lower end of right radius, initial encounter for closed fracture: Secondary | ICD-10-CM | POA: Diagnosis not present

## 2019-01-24 DIAGNOSIS — M47812 Spondylosis without myelopathy or radiculopathy, cervical region: Secondary | ICD-10-CM | POA: Diagnosis not present

## 2019-01-24 DIAGNOSIS — R51 Headache: Secondary | ICD-10-CM | POA: Diagnosis not present

## 2019-01-30 DIAGNOSIS — S52501A Unspecified fracture of the lower end of right radius, initial encounter for closed fracture: Secondary | ICD-10-CM | POA: Diagnosis not present

## 2019-02-14 DIAGNOSIS — S52501D Unspecified fracture of the lower end of right radius, subsequent encounter for closed fracture with routine healing: Secondary | ICD-10-CM | POA: Diagnosis not present

## 2019-03-07 DIAGNOSIS — S52501D Unspecified fracture of the lower end of right radius, subsequent encounter for closed fracture with routine healing: Secondary | ICD-10-CM | POA: Diagnosis not present

## 2019-03-28 DIAGNOSIS — S065XAA Traumatic subdural hemorrhage with loss of consciousness status unknown, initial encounter: Secondary | ICD-10-CM

## 2019-03-28 HISTORY — DX: Traumatic subdural hemorrhage with loss of consciousness status unknown, initial encounter: S06.5XAA

## 2019-03-29 DIAGNOSIS — E785 Hyperlipidemia, unspecified: Secondary | ICD-10-CM | POA: Diagnosis not present

## 2019-03-29 DIAGNOSIS — E538 Deficiency of other specified B group vitamins: Secondary | ICD-10-CM | POA: Diagnosis not present

## 2019-03-29 DIAGNOSIS — E039 Hypothyroidism, unspecified: Secondary | ICD-10-CM | POA: Diagnosis not present

## 2019-03-29 DIAGNOSIS — I10 Essential (primary) hypertension: Secondary | ICD-10-CM | POA: Diagnosis not present

## 2019-03-29 DIAGNOSIS — R739 Hyperglycemia, unspecified: Secondary | ICD-10-CM | POA: Diagnosis not present

## 2019-04-03 DIAGNOSIS — R7309 Other abnormal glucose: Secondary | ICD-10-CM | POA: Diagnosis not present

## 2019-04-03 DIAGNOSIS — Z0000 Encounter for general adult medical examination without abnormal findings: Secondary | ICD-10-CM | POA: Diagnosis not present

## 2019-04-08 ENCOUNTER — Emergency Department (HOSPITAL_COMMUNITY): Payer: Medicare Other

## 2019-04-08 ENCOUNTER — Inpatient Hospital Stay (HOSPITAL_COMMUNITY)
Admission: EM | Admit: 2019-04-08 | Discharge: 2019-04-15 | DRG: 026 | Disposition: A | Discharge status: Home Health | Payer: Medicare Other | Attending: Neurological Surgery | Admitting: Neurological Surgery

## 2019-04-08 DIAGNOSIS — S199XXA Unspecified injury of neck, initial encounter: Secondary | ICD-10-CM | POA: Diagnosis not present

## 2019-04-08 DIAGNOSIS — H3552 Pigmentary retinal dystrophy: Secondary | ICD-10-CM | POA: Diagnosis not present

## 2019-04-08 DIAGNOSIS — I10 Essential (primary) hypertension: Secondary | ICD-10-CM | POA: Diagnosis present

## 2019-04-08 DIAGNOSIS — G9349 Other encephalopathy: Secondary | ICD-10-CM | POA: Diagnosis not present

## 2019-04-08 DIAGNOSIS — Z79899 Other long term (current) drug therapy: Secondary | ICD-10-CM | POA: Diagnosis not present

## 2019-04-08 DIAGNOSIS — E785 Hyperlipidemia, unspecified: Secondary | ICD-10-CM | POA: Diagnosis present

## 2019-04-08 DIAGNOSIS — R404 Transient alteration of awareness: Secondary | ICD-10-CM | POA: Diagnosis not present

## 2019-04-08 DIAGNOSIS — R4182 Altered mental status, unspecified: Secondary | ICD-10-CM

## 2019-04-08 DIAGNOSIS — R569 Unspecified convulsions: Secondary | ICD-10-CM

## 2019-04-08 DIAGNOSIS — R270 Ataxia, unspecified: Secondary | ICD-10-CM | POA: Diagnosis not present

## 2019-04-08 DIAGNOSIS — R402350 Coma scale, best motor response, localizes pain, unspecified time: Secondary | ICD-10-CM | POA: Diagnosis present

## 2019-04-08 DIAGNOSIS — Z853 Personal history of malignant neoplasm of breast: Secondary | ICD-10-CM

## 2019-04-08 DIAGNOSIS — Z803 Family history of malignant neoplasm of breast: Secondary | ICD-10-CM

## 2019-04-08 DIAGNOSIS — S065X0A Traumatic subdural hemorrhage without loss of consciousness, initial encounter: Secondary | ICD-10-CM | POA: Diagnosis not present

## 2019-04-08 DIAGNOSIS — S065XAA Traumatic subdural hemorrhage with loss of consciousness status unknown, initial encounter: Secondary | ICD-10-CM

## 2019-04-08 DIAGNOSIS — R402210 Coma scale, best verbal response, none, unspecified time: Secondary | ICD-10-CM | POA: Diagnosis not present

## 2019-04-08 DIAGNOSIS — Z951 Presence of aortocoronary bypass graft: Secondary | ICD-10-CM | POA: Diagnosis not present

## 2019-04-08 DIAGNOSIS — S065X9A Traumatic subdural hemorrhage with loss of consciousness of unspecified duration, initial encounter: Secondary | ICD-10-CM | POA: Diagnosis not present

## 2019-04-08 DIAGNOSIS — H548 Legal blindness, as defined in USA: Secondary | ICD-10-CM | POA: Diagnosis not present

## 2019-04-08 DIAGNOSIS — Z923 Personal history of irradiation: Secondary | ICD-10-CM | POA: Diagnosis not present

## 2019-04-08 DIAGNOSIS — W109XXA Fall (on) (from) unspecified stairs and steps, initial encounter: Secondary | ICD-10-CM | POA: Diagnosis present

## 2019-04-08 DIAGNOSIS — Z20828 Contact with and (suspected) exposure to other viral communicable diseases: Secondary | ICD-10-CM | POA: Diagnosis present

## 2019-04-08 DIAGNOSIS — R402130 Coma scale, eyes open, to sound, unspecified time: Secondary | ICD-10-CM | POA: Diagnosis not present

## 2019-04-08 DIAGNOSIS — R7303 Prediabetes: Secondary | ICD-10-CM | POA: Diagnosis present

## 2019-04-08 DIAGNOSIS — Z7982 Long term (current) use of aspirin: Secondary | ICD-10-CM | POA: Diagnosis not present

## 2019-04-08 DIAGNOSIS — S2231XA Fracture of one rib, right side, initial encounter for closed fracture: Secondary | ICD-10-CM

## 2019-04-08 DIAGNOSIS — R4020 Unspecified coma: Secondary | ICD-10-CM | POA: Diagnosis not present

## 2019-04-08 DIAGNOSIS — I2510 Atherosclerotic heart disease of native coronary artery without angina pectoris: Secondary | ICD-10-CM | POA: Diagnosis present

## 2019-04-08 DIAGNOSIS — Y92019 Unspecified place in single-family (private) house as the place of occurrence of the external cause: Secondary | ICD-10-CM

## 2019-04-08 DIAGNOSIS — I959 Hypotension, unspecified: Secondary | ICD-10-CM | POA: Diagnosis not present

## 2019-04-08 DIAGNOSIS — Z743 Need for continuous supervision: Secondary | ICD-10-CM | POA: Diagnosis not present

## 2019-04-08 LAB — RESPIRATORY PANEL BY RT PCR (FLU A&B, COVID)
Influenza A by PCR: NEGATIVE
Influenza B by PCR: NEGATIVE
SARS Coronavirus 2 by RT PCR: NEGATIVE

## 2019-04-08 LAB — COMPREHENSIVE METABOLIC PANEL
ALT: 15 U/L (ref 0–44)
AST: 23 U/L (ref 15–41)
Albumin: 3.6 g/dL (ref 3.5–5.0)
Alkaline Phosphatase: 78 U/L (ref 38–126)
Anion gap: 14 (ref 5–15)
BUN: 26 mg/dL — ABNORMAL HIGH (ref 8–23)
CO2: 17 mmol/L — ABNORMAL LOW (ref 22–32)
Calcium: 9.5 mg/dL (ref 8.9–10.3)
Chloride: 110 mmol/L (ref 98–111)
Creatinine, Ser: 1.28 mg/dL — ABNORMAL HIGH (ref 0.44–1.00)
GFR calc Af Amer: 44 mL/min — ABNORMAL LOW (ref >60)
GFR calc non Af Amer: 38 mL/min — ABNORMAL LOW (ref >60)
Glucose, Bld: 157 mg/dL — ABNORMAL HIGH (ref 70–99)
Potassium: 4.4 mmol/L (ref 3.5–5.1)
Sodium: 141 mmol/L (ref 135–145)
Total Bilirubin: 0.9 mg/dL (ref 0.3–1.2)
Total Protein: 7.3 g/dL (ref 6.5–8.1)

## 2019-04-08 LAB — CBC
HCT: 38.2 % (ref 36.0–46.0)
Hemoglobin: 12.1 g/dL (ref 12.0–15.0)
MCH: 29.7 pg (ref 26.0–34.0)
MCHC: 31.7 g/dL (ref 30.0–36.0)
MCV: 93.6 fL (ref 80.0–100.0)
Platelets: 200 10*3/uL (ref 150–400)
RBC: 4.08 MIL/uL (ref 3.87–5.11)
RDW: 12.2 % (ref 11.5–15.5)
WBC: 6.4 10*3/uL (ref 4.0–10.5)
nRBC: 0 % (ref 0.0–0.2)

## 2019-04-08 LAB — CK: Total CK: 111 U/L (ref 38–234)

## 2019-04-08 LAB — POC SARS CORONAVIRUS 2 AG -  ED: SARS Coronavirus 2 Ag: NEGATIVE

## 2019-04-08 LAB — TROPONIN I (HIGH SENSITIVITY): Troponin I (High Sensitivity): 7 ng/L (ref <18)

## 2019-04-08 MED ORDER — LEVETIRACETAM IN NACL 1500 MG/100ML IV SOLN
1500.0000 mg | Freq: Once | INTRAVENOUS | Status: AC
  Administered 2019-04-08: 1500 mg via INTRAVENOUS
  Filled 2019-04-08: qty 100

## 2019-04-08 MED ORDER — LEVETIRACETAM IN NACL 500 MG/100ML IV SOLN
500.0000 mg | Freq: Two times a day (BID) | INTRAVENOUS | Status: DC
  Administered 2019-04-09 – 2019-04-11 (×5): 500 mg via INTRAVENOUS
  Filled 2019-04-08 (×5): qty 100

## 2019-04-08 MED ORDER — SODIUM CHLORIDE 0.9% FLUSH
3.0000 mL | Freq: Once | INTRAVENOUS | Status: AC
  Administered 2019-04-08: 3 mL via INTRAVENOUS

## 2019-04-08 MED ORDER — LORAZEPAM 2 MG/ML IJ SOLN
INTRAMUSCULAR | Status: AC
  Filled 2019-04-08: qty 1

## 2019-04-08 MED ORDER — LORAZEPAM 2 MG/ML IJ SOLN
1.0000 mg | Freq: Once | INTRAMUSCULAR | Status: AC
  Administered 2019-04-08: 1 mg via INTRAVENOUS

## 2019-04-08 NOTE — ED Notes (Signed)
Pt restless, removing gown and all monitoring leads. Verbal order obtained from [place]

## 2019-04-08 NOTE — ED Provider Notes (Signed)
[facility] EMERGENCY DEPARTMENT Provider Note   CSN: [id number] Arrival date & time: 04/08/19  2046     History Chief Complaint  Patient presents with  . Altered Mental Status    Olivia Gray is a 85 y.o. female.  The history is provided by the EMS personnel, the spouse and medical records. No language interpreter was used.  Seizures Seizure activity on arrival: no   Seizure type:  Focal Initial focality:  Facial Episode characteristics: eye deviation, stiffening and unresponsiveness   Postictal symptoms: somnolence   Return to baseline: no   Severity:  Moderate Timing:  Clustered Progression:  Unchanged Recent head injury:  During the event PTA treatment:  Midazolam History of seizures: no        Past Medical History:  Diagnosis Date  . Breast cancer ([place]) 01/29/2016   Left Breast Cancer  . Breast cancer of upper-outer quadrant of left female breast ([place]) 12/27/2015  . CAD, multiple vessel 2004   Myoview 2011: Mild Basal-mid Inferolateral "ischemia", EF ~76?% -- Anormal, but LOW RISK  . Essential hypertension   . History of radiation therapy 03/11/16-04/29/16   left breast/axilla 50.4 Gy in 28 fractions, boost of 10 Gy in 5 fractions  . Hyperlipidemia LDL goal <70    On statin.  . Legally blind   . Mitral valve prolapse 02/07/2008; April 2015   a) Mild Anterior MVP, with mild MR.;; b) Echo 07/2013: EF 55-60%, Gr 1 DD, no WMA, Mild central MR with late systolic prolapse   . Personal history of radiation therapy 2017   Left Breast Cancer  . Pre-diabetes   . Retinitis pigmentosa of both eyes     now essentially legally blind.  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 S/P CABG x 4 2004   SVG-LAD, SVG-D1, SVG-OM1, SVG-RPDA. (Native LIMA was 100% occluded)    Patient Active Problem List   Diagnosis Date Noted  . Separation of left  acromioclavicular joint, type 5 07/13/2016  . Osteoporosis 05/07/2016  . Breast cancer of upper-outer quadrant of left female breast ([place]) 12/27/2015  . Musculoskeletal chest pain 09/10/2014  . S/P CABG x 4   . Essential hypertension     Class: Chronic  . Hyperlipidemia LDL goal <70   . Mitral valve anterior leaflet prolapse   . Coronary artery disease involving native heart without angina pectoris 05/14/2002    Past Surgical History:  Procedure Laterality Date  . BREAST LUMPECTOMY Left 01/29/2016  . BREAST LUMPECTOMY WITH RADIOACTIVE SEED LOCALIZATION Left 01/29/2016   Procedure: LEFT BREAST LUMPECTOMY WITH RADIOACTIVE SEED LOCALIZATION;  Surgeon: [name], MD;  Location: [place];  Service: General;  Laterality: Left;  . BREAST SURGERY Bilateral    cyst removal  . CARDIAC CATHETERIZATION  05/22/2009   all grafts patent ,LV systolic fx normal,small -vessel diabetic disease in native vessels   . CHOLECYSTECTOMY  02/05/2005  . CORONARY ARTERY BYPASS GRAFT  07/21/2002   in [place] ; SVG-LAD, SVG-D1, SVG-OM1, SVG RPDA.  . DOPPLER ECHOCARDIOGRAPHY  02/07/2008; April 2015   EF=>55%; LV normal; mild anterior MVP with mild MR.; b) 07/2013: EF 55-60%. Normal wall motion. Gr 1 DD, mild MR with late systolic prolapse. Normal central venous and pulmonary arterial pressures   . NM MYOCAR PERF WALL MOTION  04/2009   EF 76%; mild ischeia basal inferolateral,mid inferoinlateral regions(s), abn  pharmacologic nuc test, deffect indicte new ischemia,LV syst.fx normal --> cath showing no obstructive targets.  Marland Kitchen NM MYOVIEW LTD  June 2016   Normal perfusion. Normal study. LOW RISK. Normal EF (55%). Compared to prior study, no ischemic changes present.     OB History   No obstetric history on file.     Family History  Problem Relation Age of Onset  . Breast cancer Sister   . Breast cancer Sister     Social History   Tobacco Use  . Smoking status: Never Smoker  .  Smokeless tobacco: Never Used  Substance Use Topics  . Alcohol use: No  . Drug use: No    Home Medications Prior to Admission medications   Medication Sig Start Date End Date Taking? Authorizing Provider  amLODipine (NORVASC) 10 MG tablet TAKE 1 TABLET (10 MG TOTAL) BY MOUTH DAILY. 11/11/18   Leonie Man, MD  aspirin 81 MG tablet Take 81 mg by mouth daily.    [provider]  carvedilol (COREG) 3.125 MG tablet TAKE 1 TABLET (3.125 MG TOTAL) BY MOUTH 2 (TWO) TIMES DAILY. 11/11/18   Leonie Man, MD  cholecalciferol (VITAMIN D) 1000 UNITS tablet Take 1,000 Units by mouth daily.    [provider]  cyanocobalamin 1000 MCG tablet Take 100 mcg by mouth daily.    [provider]  diclofenac sodium (VOLTAREN) 1 % GEL APPLY TO AFFECTED AREA 4 TIMES A DAY AS NEEDED 12/10/15   [provider]  escitalopram (LEXAPRO) 10 MG tablet Take 10 mg by mouth daily.    [provider]  ipratropium (ATROVENT) 0.06 % nasal spray Place 1 spray into the nose daily. 06/26/13   [provider]  latanoprost (XALATAN) 0.005 % ophthalmic solution Place 1 drop into both eyes at bedtime. 08/12/15   [provider]  letrozole ([place]) 2.5 MG tablet TAKE 1 TABLET BY MOUTH EVERY DAY 01/09/19   Nicholas Lose, MD  nitroGLYCERIN (NITROSTAT) 0.4 MG SL tablet Place 1 tablet (0.4 mg total) under the tongue every 5 (five) minutes as needed for chest pain. 11/11/18   Leonie Man, MD  simvastatin (ZOCOR) 20 MG tablet Take 1 tablet (20 mg total) by mouth at bedtime. 11/11/18 02/09/19  Leonie Man, MD  valsartan (DIOVAN) 320 MG tablet TAKE 1 TABLET DAILY. TO REPLACE IRBESARTAN. 11/11/18   Leonie Man, MD    Allergies    Patient has no known allergies.  Review of Systems   Review of Systems  Unable to perform ROS: Patient unresponsive  Neurological: Positive for seizures.    Physical Exam Updated Vital Signs BP (!) 141/56 (BP Location: Left Arm)    Pulse 80   Temp 97.7 F (36.5 C) (Axillary)   Resp 17   SpO2 96%   Physical Exam Vitals and nursing note reviewed.  Constitutional:      General: She is not in acute distress.    Appearance: She is well-developed. She is ill-appearing. She is not toxic-appearing or diaphoretic.  HENT:     Head: Normocephalic and atraumatic.     Comments: Cloudy pupils but they are symmetric.  Pupils are 4 mm and slightly reactive bilaterally.    Right Ear: External ear normal.     Left Ear: External ear normal.     Nose: Nose normal. No congestion or rhinorrhea.     Mouth/Throat:     Mouth: Mucous membranes are moist.     Pharynx: No oropharyngeal exudate or  posterior oropharyngeal erythema.  Eyes:     Conjunctiva/sclera: Conjunctivae normal.     Pupils: Pupils are equal, round, and reactive to light.  Cardiovascular:     Rate and Rhythm: Normal rate.     Pulses: Normal pulses.     Heart sounds: No murmur.  Pulmonary:     Effort: No respiratory distress.     Breath sounds: No stridor. No wheezing, rhonchi or rales.  Chest:     Chest wall: No tenderness.  Abdominal:     General: Abdomen is flat. There is no distension.     Tenderness: There is no abdominal tenderness. There is no right CVA tenderness, left CVA tenderness or rebound.  Musculoskeletal:        General: No tenderness.     Cervical back: Normal range of motion and neck supple.     Right lower leg: No edema.     Left lower leg: No edema.  Skin:    General: Skin is warm.     Capillary Refill: Capillary refill takes less than 2 seconds.     Coloration: Skin is not pale.     Findings: No erythema or rash.  Neurological:     GCS: GCS eye subscore is 3. GCS verbal subscore is 1. GCS motor subscore is 5.     Cranial Nerves: No facial asymmetry.     Sensory: Sensory deficit present.     Motor: Weakness present. No abnormal muscle tone.     Deep Tendon Reflexes: Reflexes are normal and symmetric.     Comments: On my exam, patient  not moving right arm or right leg to painful stimuli.  She is moving her left arm and left leg with purpose.  Patient is somnolent and not speaking.     ED Results / Procedures / Treatments   Labs (all labs ordered are listed, but only abnormal results are displayed) Labs Reviewed  COMPREHENSIVE METABOLIC PANEL - Abnormal; Notable for the following components:      Result Value   CO2 17 (*)    Glucose, Bld 157 (*)    BUN 26 (*)    Creatinine, Ser 1.28 (*)    GFR calc non Af Amer 38 (*)    GFR calc Af Amer 44 (*)    All other components within normal limits  RESPIRATORY PANEL BY RT PCR (FLU A&B, COVID)  CBC  CK  AMMONIA  URINALYSIS, ROUTINE W REFLEX MICROSCOPIC  TSH  RAPID URINE DRUG SCREEN, HOSP PERFORMED  POC SARS CORONAVIRUS 2 AG -  ED  CBG MONITORING, ED  TROPONIN I (HIGH SENSITIVITY)  TROPONIN I (HIGH SENSITIVITY)    EKG None  Radiology CT Head Wo Contrast  Result Date: 04/08/2019 CLINICAL DATA:  Altered mental status.  Found on floor EXAM: CT HEAD WITHOUT CONTRAST CT CERVICAL SPINE WITHOUT CONTRAST TECHNIQUE: Multidetector CT imaging of the head and cervical spine was performed following the standard protocol without intravenous contrast. Multiplanar CT image reconstructions of the cervical spine were also generated. COMPARISON:  CT head 08/28/2003 FINDINGS: CT HEAD FINDINGS Brain: Image quality degraded by motion.  Three attempts were made. Large right-sided subdural hematoma. This has layering hematocrit level with hypodense fluid anteriorly. The fluid collection measures up to 17 mm in thickness. 7 mm midline shift to the left. Small left parietal hypodense subdural fluid collection with small areas of mildly increased density. This could be due to more recent hemorrhage or calcification. Ventricle size normal.  No acute infarct or  mass lesion. Vascular: Negative for hyperdense vessel Skull: Skeletal evaluation limited for fracture evaluation due to motion.  Sinuses/Orbits: Negative Other: None CT CERVICAL SPINE FINDINGS Alignment: Mild anterolisthesis C4-5 and C5-6 Skull base and vertebrae: Negative for fracture Soft tissues and spinal canal: Negative for mass or edema.  Goiter Disc levels: Multilevel disc and facet degeneration throughout the cervical spine. Right foraminal narrowing at C3-4 due to spurring. Upper chest: Lung apices clear bilaterally. Other: None IMPRESSION: Large subacute subdural hematoma on the right measuring 17 mm in thickness. 7 mm midline shift to left Small left parietal low-density subdural fluid collection which may be subacute. Negative for cervical spine fracture. These results were called by telephone at the time of interpretation on 04/08/2019 at 9:42 pm to provider [facility] , who verbally acknowledged these results. Electronically Signed   By: Franchot Gallo M.D.   On: 04/08/2019 21:43   CT Cervical Spine Wo Contrast  Result Date: 04/08/2019 CLINICAL DATA:  Altered mental status.  Found on floor EXAM: CT HEAD WITHOUT CONTRAST CT CERVICAL SPINE WITHOUT CONTRAST TECHNIQUE: Multidetector CT imaging of the head and cervical spine was performed following the standard protocol without intravenous contrast. Multiplanar CT image reconstructions of the cervical spine were also generated. COMPARISON:  CT head 08/28/2003 FINDINGS: CT HEAD FINDINGS Brain: Image quality degraded by motion.  Three attempts were made. Large right-sided subdural hematoma. This has layering hematocrit level with hypodense fluid anteriorly. The fluid collection measures up to 17 mm in thickness. 7 mm midline shift to the left. Small left parietal hypodense subdural fluid collection with small areas of mildly increased density. This could be due to more recent hemorrhage or calcification. Ventricle size normal.  No acute infarct or mass lesion. Vascular: Negative for hyperdense vessel Skull: Skeletal evaluation limited for fracture evaluation due to  motion. Sinuses/Orbits: Negative Other: None CT CERVICAL SPINE FINDINGS Alignment: Mild anterolisthesis C4-5 and C5-6 Skull base and vertebrae: Negative for fracture Soft tissues and spinal canal: Negative for mass or edema.  Goiter Disc levels: Multilevel disc and facet degeneration throughout the cervical spine. Right foraminal narrowing at C3-4 due to spurring. Upper chest: Lung apices clear bilaterally. Other: None IMPRESSION: Large subacute subdural hematoma on the right measuring 17 mm in thickness. 7 mm midline shift to left Small left parietal low-density subdural fluid collection which may be subacute. Negative for cervical spine fracture. These results were called by telephone at the time of interpretation on 04/08/2019 at 9:42 pm to provider [facility] , who verbally acknowledged these results. Electronically Signed   By: Franchot Gallo M.D.   On: 04/08/2019 21:43   DG Chest Portable 1 View  Result Date: 04/08/2019 CLINICAL DATA:  Altered mental status EXAM: PORTABLE CHEST 1 VIEW COMPARISON:  06/15/2016 FINDINGS: Heart size is enlarged. The patient is status post prior median sternotomy. There is pleuroparenchymal scarring at the lung apices. No pneumothorax. No large pleural effusion. There is an acute appearing minimally displaced fracture involving the fourth rib laterally on the right. There is atelectasis versus scarring at the lung bases. IMPRESSION: 1. Acute appearing minimally displaced fracture involving the fourth rib laterally on the right. 2. Bibasilar atelectasis versus scarring. 3. No pneumothorax or large pleural effusion. 4. Cardiomegaly. Electronically Signed   By: Constance Holster M.D.   On: 04/08/2019 21:44    Procedures Procedures (including critical care time)  CRITICAL CARE Performed by: Gwenyth Allegra Brendy Ficek Total critical care time: 35 minutes Critical care time was exclusive of separately billable  procedures and treating other patients. Critical care was  necessary to treat or prevent imminent or life-threatening deterioration. Critical care was time spent personally by me on the following activities: development of treatment plan with patient and/or surrogate as well as nursing, discussions with consultants, evaluation of patient's response to treatment, examination of patient, obtaining history from patient or surrogate, ordering and performing treatments and interventions, ordering and review of laboratory studies, ordering and review of radiographic studies, pulse oximetry and re-evaluation of patient's condition.    Medications Ordered in ED Medications  levETIRAcetam (KEPPRA) IVPB 500 mg/100 mL premix (has no administration in time range)  sodium chloride flush (NS) 0.9 % injection 3 mL (3 mLs Intravenous Given 04/08/19 2256)  levETIRAcetam (KEPPRA) IVPB 1500 mg/ 100 mL premix (0 mg Intravenous Stopped 04/08/19 2249)  LORazepam (ATIVAN) injection 1 mg (1 mg Intravenous Given 04/08/19 2256)    ED Course  I have reviewed the triage vital signs and the nursing notes.  Pertinent labs & imaging results that were available during my care of the patient were reviewed by me and considered in my medical decision making (see chart for details).    MDM Rules/Calculators/A&P                        Abie Zahner is a 85 y.o. female with a past medical history significant for CAD status post CABG, hypertension, hyperlipidemia, and prior breast cancer who presents for seizure and altered mental status and possible fall.  Patient is brought in by EMS after she was last normal at 3 PM.  At 6 PM, husband went down to check on her and she was found on the ground.  When first responders got there, they did approximately 2 compressions of CPR and she started moving.  Patient was found by EMS to have seizure-like activity with right gaze preference and twitching of her mouth.  Patient also has not been moving her right arm and right leg which has been reports  is new.  Has been says that she is otherwise been acting normal recently and had a fall several months ago.  She reports that she had a CT at that time which did not show any problems.  Patient is not of answer any questions but is somnolent.  She is blind but sounds like she is able to get around according to husband.  On arrival, patient does not appear to be seizing as she got Versed with EMS.  On exam, patient appears to be protecting her airway as her oxygen saturations are normal.  She is on room air.  On exam, patient is not moving her right arm or right leg to painful stimuli but is moving her left arm and left leg.  Pupils are cloudy but symmetric.  They are reactive at 4 mm.  Patient did not have any facial twitching for me.  Lungs were clear and chest was nontender.  Abdomen was nontender.  I not see evidence of trauma.  Patient was loaded with Keppra and neurology was called for the new seizure.  Given her history of cancer, she may need MRI to look for a metastasis leading to seizure.  CT was obtained showing evidence of bilateral subdural hematomas with a new component and midline shift.  Neurosurgery was called who will see patient.  Neurosurgical take patient to the OR.  X-ray also shows she has a rib fracture, suspect this was related to the fall or the chest  compressions she received.  Coronavirus test is negative.  Initial troponin negative.  Metabolic panel shows creatinine 1.2.  CBC reassuring.  CK not elevated.  Anticipate admission after her trip to the OR with neurosurgery.    Final Clinical Impression(s) / ED Diagnoses Final diagnoses:  Altered mental status, unspecified altered mental status type  Bilateral subdural hematomas (HCC)  Seizure (HCC)  Closed fracture of one rib of right side, initial encounter     Clinical Impression: 1. Altered mental status, unspecified altered mental status type   2. Bilateral subdural hematomas (HCC)   3. Seizure (HCC)   4.  Closed fracture of one rib of right side, initial encounter     Disposition: Admit  This note was prepared with assistance of Dragon voice recognition software. Occasional wrong-word or sound-a-like substitutions may have occurred due to the inherent limitations of voice recognition software.     Nil Xiong, Gwenyth Allegra, MD 04/09/19 [phone]

## 2019-04-08 NOTE — Consult Note (Signed)
Neurosurgery Consultation  Reason for Consult: Subdural hematoma Referring Physician: Tegeler  CC: Seizure  HPI: This is a 85 y.o. woman that presents with acute AMS. She was found found down today by her husband, obtunded with some clonic movement. Reportedly EMS or ED staff noticed the patient with a right gaze and some clonic movement of the RUE followed by continued right sided weakness. No known history of seizures, no history of similar events. +[id number] but no other anti-platelet or anti-coagulant medications.   ROS: A 14 point ROS was performed and is negative except as noted in the HPI.   PMHx:  Past Medical History:  Diagnosis Date  . Breast cancer ([place]) 01/29/2016   Left Breast Cancer  . Breast cancer of upper-outer quadrant of left female breast ([place]) 12/27/2015  . CAD, multiple vessel 2004   Myoview 2011: Mild Basal-mid Inferolateral "ischemia", EF ~76?% -- Anormal, but LOW RISK  . Essential hypertension   . History of radiation therapy 03/11/16-04/29/16   left breast/axilla 50.4 Gy in 28 fractions, boost of 10 Gy in 5 fractions  . Hyperlipidemia LDL goal <70    On statin.  . Legally blind   . Mitral valve prolapse 02/07/2008; April 2015   a) Mild Anterior MVP, with mild MR.;; b) Echo 07/2013: EF 55-60%, Gr 1 DD, no WMA, Mild central MR with late systolic prolapse   . Personal history of radiation therapy 2017   Left Breast Cancer  . Pre-diabetes   . Retinitis pigmentosa of both eyes     now essentially legally blind.  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 S/P CABG x 4 2004   SVG-LAD, SVG-D1, SVG-OM1, SVG-RPDA. (Native LIMA was 100% occluded)   FamHx:  Family History  Problem Relation Age of Onset  . Breast cancer Sister   . Breast cancer Sister    SocHx:  reports that she has never smoked. She has never used smokeless tobacco. She reports that she  does not drink alcohol or use drugs.  Exam: Vital signs in last 24 hours: Temp:  [97.7 F (36.5 C)] 97.7 F (36.5 C) (12/12 2052) Pulse Rate:  [80-87] 85 (12/12 2257) Resp:  [16-21] 16 (12/12 2257) BP: (141-161)/(56-71) 161/68 (12/12 2257) SpO2:  [96 %-100 %] 100 % (12/12 2257) General: Lying in hospital bed, appears confused and restless Head: normocephalic and atruamatic HEENT: neck supple Pulmonary: breathing room air comfortably, no evidence of increased work of breathing Cardiac: RRR Abdomen: S NT ND Extremities: warm and well perfused x4 Neuro: Somnolent, incorrectly answers yes/no, Ox zero, not FC, PERRL, gaze neutral, restless, withdraws to pain x4 but slower on the right than left.   Assessment and Plan: 84 y.o. woman with acute obtundation, clinical history most consistent with left hemispheric ictus then post-ictal period. CT head personally reviewed, which shows mixed density right sided SDH measuring 55mm in thickness with some layering of subacute blood. Smaller left sided SDH that appears more subacute, measuring 63mm.   Exam localizes to a left sided seizure, but right sided SDH is much larger. I therefore recommend bilateral burr hole evacuations to improve seizure control from the left hemisphere as well as resolve mass effect / brain compression on the right. Discussed with the husband regarding r/b/a and he thinks the patient would like to proceed if surgery if she were able to answer. Of note, pt is a Acupuncturist  witness and the husband confirmed that she would rather die than receive a blood transfusion.  I'd prefer to wait until morning to let her recover from her post-ictal state to increase the chances that she can be extubated immediately post-op.  -admit to neuro ICU overnight, OR in the morning for bilateral burr holes  -AED management deferred to neurology, pt being loaded w/ levetiracetam currently  Judith Part, MD 04/08/19 11:27 PM [facility]

## 2019-04-08 NOTE — ED Notes (Signed)
Daughter- kierstan lapsley, would like an update at [phone]

## 2019-04-08 NOTE — Consult Note (Signed)
Neurology Consultation Reason for Consult: Seizures Referring Physician: Tegeler, C  CC: Seizures  History is obtained from: Patient  HPI: Olivia Gray is a 85 y.o. female with a history of breast cancer presents with new onset seizures.  Her husband is not certain of which side was shaking, but it was 1 side.  Reportedly, on arrival, she had significant right-sided weakness.  Her exam is steadily improving, but on CT there is significant subdural hematoma bilaterally, with an acute component on the right side.  ROS:  Unable to obtain due to altered mental status.   Past Medical History:  Diagnosis Date  . Breast cancer ([place]) 01/29/2016   Left Breast Cancer  . Breast cancer of upper-outer quadrant of left female breast ([place]) 12/27/2015  . CAD, multiple vessel 2004   Myoview 2011: Mild Basal-mid Inferolateral "ischemia", EF ~76?% -- Anormal, but LOW RISK  . Essential hypertension   . History of radiation therapy 03/11/16-04/29/16   left breast/axilla 50.4 Gy in 28 fractions, boost of 10 Gy in 5 fractions  . Hyperlipidemia LDL goal <70    On statin.  . Legally blind   . Mitral valve prolapse 02/07/2008; April 2015   a) Mild Anterior MVP, with mild MR.;; b) Echo 07/2013: EF 55-60%, Gr 1 DD, no WMA, Mild central MR with late systolic prolapse   . Personal history of radiation therapy 2017   Left Breast Cancer  . Pre-diabetes   . Retinitis pigmentosa of both eyes     now essentially legally blind.  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 S/P CABG x 4 2004   SVG-LAD, SVG-D1, SVG-OM1, SVG-RPDA. (Native LIMA was 100% occluded)     Family History  Problem Relation Age of Onset  . Breast cancer Sister   . Breast cancer Sister      Social History:  reports that she has never smoked. She has never used smokeless tobacco. She reports that she does not drink alcohol or use  drugs.   Exam: Current vital signs: BP (!) 155/71   Pulse 87   Temp 97.7 F (36.5 C) (Axillary)   Resp (!) 21   SpO2 96%  Vital signs in last 24 hours: Temp:  [97.7 F (36.5 C)] 97.7 F (36.5 C) (12/12 2052) Pulse Rate:  [80-87] 87 (12/12 2201) Resp:  [17-21] 21 (12/12 2201) BP: (141-155)/(56-71) 155/71 (12/12 2201) SpO2:  [96 %-97 %] 96 % (12/12 2201)   Physical Exam  Constitutional: Appears well-developed and well-nourished.  Psych: Affect appropriate to situation Eyes: No scleral injection HENT: No OP obstrucion MSK: no joint deformities.  Cardiovascular: Normal rate and regular rhythm.  Respiratory: Effort normal, non-labored breathing GI: Soft.  No distension. There is no tenderness.  Skin: WDI  Neuro: Mental Status: Patient is awake, agitated she does not answer questions or follow commands, though she does say "let go of my arm, [name]" she also has significant verbal output which is nonsensical(not formed words) Cranial Nerves: II: She does not blink to threat for me from either direction, but she does fixate when I am standing on her left side pupils are equal, round, and reactive to light.   III,IV, VI: She has a left gaze preference, I do not see her cross midline to the right VII: Facial movement is symmetric.  Motor: She moves bilateral arms purposefully with good strength. Sensory: Response to noxious  stimulation in all 4 extremities Cerebellar: Does not perform   I have reviewed labs in epic and the results pertinent to this consultation are: Creatinine 1.2 CBC-unremarkable  I have reviewed the images obtained: CT head-right >left subdural hematomas  Impression: 85 year old female with new onset seizures in the setting of subdural hematomas.  I do suspect that these are the likely etiology and neurosurgery has been consulted.  I would favor starting her on Keppra at the current time.  Though the majority of her blood is on the right, her exam is more  consistent with left-sided dysfunction, I suspect that this is where her seizure focus originated and we are seeing some degree of tonsil dominant.  Recommendations: 1) Keppra 500 mg twice daily 2) agree with neurosurgical consultation 3) routine EEG   Roland Rack, MD [facility] [phone]  If 7pm- 7am, please page neurology on call as listed in [place].

## 2019-04-08 NOTE — ED Triage Notes (Signed)
Came in via ems. Reported was found on the floor unconscious by husband around 8pm; reported last seen normal around 1700. Reported FD initiated BVM and compression x2 and patient responded. EMS reported witnessed focal sz x2 w/ no seizure history.  Midazolam 5 mg IV given total. Reported patient is blind.

## 2019-04-08 NOTE — ED Notes (Signed)
Patient transported to CT 

## 2019-04-09 ENCOUNTER — Encounter (HOSPITAL_COMMUNITY)
Admission: EM | Disposition: A | Discharge status: Home Health | Payer: Self-pay | Source: Home / Self Care | Attending: Neurological Surgery

## 2019-04-09 ENCOUNTER — Encounter (HOSPITAL_COMMUNITY): Payer: Self-pay | Admitting: Neurological Surgery

## 2019-04-09 ENCOUNTER — Inpatient Hospital Stay (HOSPITAL_COMMUNITY): Payer: Medicare Other | Admitting: Anesthesiology

## 2019-04-09 ENCOUNTER — Inpatient Hospital Stay (HOSPITAL_COMMUNITY): Payer: Medicare Other

## 2019-04-09 DIAGNOSIS — H3552 Pigmentary retinal dystrophy: Secondary | ICD-10-CM | POA: Diagnosis present

## 2019-04-09 DIAGNOSIS — W109XXA Fall (on) (from) unspecified stairs and steps, initial encounter: Secondary | ICD-10-CM | POA: Diagnosis present

## 2019-04-09 DIAGNOSIS — M810 Age-related osteoporosis without current pathological fracture: Secondary | ICD-10-CM | POA: Diagnosis not present

## 2019-04-09 DIAGNOSIS — R4182 Altered mental status, unspecified: Secondary | ICD-10-CM | POA: Diagnosis not present

## 2019-04-09 DIAGNOSIS — Z923 Personal history of irradiation: Secondary | ICD-10-CM | POA: Diagnosis not present

## 2019-04-09 DIAGNOSIS — G9349 Other encephalopathy: Secondary | ICD-10-CM | POA: Diagnosis not present

## 2019-04-09 DIAGNOSIS — I2510 Atherosclerotic heart disease of native coronary artery without angina pectoris: Secondary | ICD-10-CM | POA: Diagnosis present

## 2019-04-09 DIAGNOSIS — S065X9A Traumatic subdural hemorrhage with loss of consciousness of unspecified duration, initial encounter: Secondary | ICD-10-CM | POA: Diagnosis present

## 2019-04-09 DIAGNOSIS — R402130 Coma scale, eyes open, to sound, unspecified time: Secondary | ICD-10-CM | POA: Diagnosis present

## 2019-04-09 DIAGNOSIS — Z7982 Long term (current) use of aspirin: Secondary | ICD-10-CM | POA: Diagnosis not present

## 2019-04-09 DIAGNOSIS — Z803 Family history of malignant neoplasm of breast: Secondary | ICD-10-CM | POA: Diagnosis not present

## 2019-04-09 DIAGNOSIS — Y92019 Unspecified place in single-family (private) house as the place of occurrence of the external cause: Secondary | ICD-10-CM | POA: Diagnosis not present

## 2019-04-09 DIAGNOSIS — Z20828 Contact with and (suspected) exposure to other viral communicable diseases: Secondary | ICD-10-CM | POA: Diagnosis present

## 2019-04-09 DIAGNOSIS — R569 Unspecified convulsions: Secondary | ICD-10-CM | POA: Diagnosis not present

## 2019-04-09 DIAGNOSIS — E785 Hyperlipidemia, unspecified: Secondary | ICD-10-CM | POA: Diagnosis not present

## 2019-04-09 DIAGNOSIS — R402210 Coma scale, best verbal response, none, unspecified time: Secondary | ICD-10-CM | POA: Diagnosis not present

## 2019-04-09 DIAGNOSIS — S065XAA Traumatic subdural hemorrhage with loss of consciousness status unknown, initial encounter: Secondary | ICD-10-CM | POA: Diagnosis present

## 2019-04-09 DIAGNOSIS — Z79899 Other long term (current) drug therapy: Secondary | ICD-10-CM | POA: Diagnosis not present

## 2019-04-09 DIAGNOSIS — H548 Legal blindness, as defined in USA: Secondary | ICD-10-CM | POA: Diagnosis present

## 2019-04-09 DIAGNOSIS — Z853 Personal history of malignant neoplasm of breast: Secondary | ICD-10-CM | POA: Diagnosis not present

## 2019-04-09 DIAGNOSIS — I6202 Nontraumatic subacute subdural hemorrhage: Secondary | ICD-10-CM | POA: Diagnosis not present

## 2019-04-09 DIAGNOSIS — I6200 Nontraumatic subdural hemorrhage, unspecified: Secondary | ICD-10-CM | POA: Diagnosis not present

## 2019-04-09 DIAGNOSIS — R7303 Prediabetes: Secondary | ICD-10-CM | POA: Diagnosis present

## 2019-04-09 DIAGNOSIS — R270 Ataxia, unspecified: Secondary | ICD-10-CM | POA: Diagnosis present

## 2019-04-09 DIAGNOSIS — Z951 Presence of aortocoronary bypass graft: Secondary | ICD-10-CM | POA: Diagnosis not present

## 2019-04-09 DIAGNOSIS — S2231XA Fracture of one rib, right side, initial encounter for closed fracture: Secondary | ICD-10-CM | POA: Diagnosis not present

## 2019-04-09 DIAGNOSIS — R402350 Coma scale, best motor response, localizes pain, unspecified time: Secondary | ICD-10-CM | POA: Diagnosis present

## 2019-04-09 DIAGNOSIS — I10 Essential (primary) hypertension: Secondary | ICD-10-CM | POA: Diagnosis not present

## 2019-04-09 HISTORY — PX: CRANIOTOMY: SHX93

## 2019-04-09 LAB — TROPONIN I (HIGH SENSITIVITY): Troponin I (High Sensitivity): 21 ng/L — ABNORMAL HIGH (ref <18)

## 2019-04-09 LAB — TSH: TSH: 0.697 u[IU]/mL (ref 0.350–4.500)

## 2019-04-09 LAB — AMMONIA: Ammonia: 21 umol/L (ref 9–35)

## 2019-04-09 LAB — MRSA PCR SCREENING: MRSA by PCR: NEGATIVE

## 2019-04-09 SURGERY — CRANIOTOMY HEMATOMA EVACUATION SUBDURAL
Anesthesia: General | Site: Head | Laterality: Bilateral

## 2019-04-09 MED ORDER — FENTANYL CITRATE (PF) 100 MCG/2ML IJ SOLN: 25.0000 ug | INTRAMUSCULAR | Status: DC | PRN

## 2019-04-09 MED ORDER — ACETAMINOPHEN 325 MG PO TABS
650.0000 mg | ORAL_TABLET | ORAL | Status: DC | PRN
  Administered 2019-04-11: 650 mg via ORAL
  Filled 2019-04-09: qty 2

## 2019-04-09 MED ORDER — THROMBIN 20000 UNITS EX SOLR
CUTANEOUS | Status: DC | PRN
  Administered 2019-04-09: 11:00:00 via TOPICAL

## 2019-04-09 MED ORDER — LORAZEPAM 2 MG/ML IJ SOLN
1.0000 mg | Freq: Once | INTRAMUSCULAR | Status: AC
  Administered 2019-04-09: 1 mg via INTRAVENOUS

## 2019-04-09 MED ORDER — ROCURONIUM BROMIDE 10 MG/ML (PF) SYRINGE
PREFILLED_SYRINGE | INTRAVENOUS | Status: AC
  Filled 2019-04-09: qty 10

## 2019-04-09 MED ORDER — CARVEDILOL 3.125 MG PO TABS
3.1250 mg | ORAL_TABLET | Freq: Two times a day (BID) | ORAL | Status: DC
  Administered 2019-04-10 – 2019-04-15 (×10): 3.125 mg via ORAL
  Filled 2019-04-09 (×10): qty 1

## 2019-04-09 MED ORDER — THROMBIN 5000 UNITS EX SOLR
CUTANEOUS | Status: AC
  Filled 2019-04-09: qty 5000

## 2019-04-09 MED ORDER — POLYETHYLENE GLYCOL 3350 17 G PO PACK: 17.0000 g | PACK | Freq: Every day | ORAL | Status: DC | PRN

## 2019-04-09 MED ORDER — PHENYLEPHRINE HCL-NACL 10-0.9 MG/250ML-% IV SOLN
INTRAVENOUS | Status: DC | PRN
  Administered 2019-04-09: 40 ug/min via INTRAVENOUS

## 2019-04-09 MED ORDER — LIDOCAINE HCL (CARDIAC) PF 100 MG/5ML IV SOSY
PREFILLED_SYRINGE | INTRAVENOUS | Status: DC | PRN
  Administered 2019-04-09: 60 mg via INTRATRACHEAL

## 2019-04-09 MED ORDER — LORAZEPAM 2 MG/ML IJ SOLN
INTRAMUSCULAR | Status: AC
  Filled 2019-04-09: qty 1

## 2019-04-09 MED ORDER — ARTIFICIAL TEARS OPHTHALMIC OINT
TOPICAL_OINTMENT | OPHTHALMIC | Status: AC
  Filled 2019-04-09: qty 3.5

## 2019-04-09 MED ORDER — CHLORHEXIDINE GLUCONATE CLOTH 2 % EX PADS
6.0000 | MEDICATED_PAD | Freq: Every day | CUTANEOUS | Status: DC
  Administered 2019-04-10: 6 via TOPICAL

## 2019-04-09 MED ORDER — THROMBIN 20000 UNITS EX SOLR
CUTANEOUS | Status: AC
  Filled 2019-04-09: qty 20000

## 2019-04-09 MED ORDER — ONDANSETRON HCL 4 MG PO TABS: 4.0000 mg | ORAL_TABLET | ORAL | Status: DC | PRN

## 2019-04-09 MED ORDER — ONDANSETRON HCL 4 MG/2ML IJ SOLN
INTRAMUSCULAR | Status: AC
  Filled 2019-04-09: qty 2

## 2019-04-09 MED ORDER — AMLODIPINE BESYLATE 10 MG PO TABS
10.0000 mg | ORAL_TABLET | Freq: Every day | ORAL | Status: DC
  Administered 2019-04-10 – 2019-04-15 (×6): 10 mg via ORAL
  Filled 2019-04-09 (×7): qty 1

## 2019-04-09 MED ORDER — DOCUSATE SODIUM 100 MG PO CAPS
100.0000 mg | ORAL_CAPSULE | Freq: Two times a day (BID) | ORAL | Status: DC
  Administered 2019-04-10 – 2019-04-15 (×7): 100 mg via ORAL
  Filled 2019-04-09 (×10): qty 1

## 2019-04-09 MED ORDER — LETROZOLE 2.5 MG PO TABS
2.5000 mg | ORAL_TABLET | Freq: Every day | ORAL | Status: DC
  Administered 2019-04-10 – 2019-04-15 (×6): 2.5 mg via ORAL
  Filled 2019-04-09 (×7): qty 1

## 2019-04-09 MED ORDER — LABETALOL HCL 5 MG/ML IV SOLN: 10.0000 mg | INTRAVENOUS | Status: DC | PRN

## 2019-04-09 MED ORDER — HEMOSTATIC AGENTS (NO CHARGE) OPTIME
TOPICAL | Status: DC | PRN
  Administered 2019-04-09: 1 via TOPICAL

## 2019-04-09 MED ORDER — CEFAZOLIN SODIUM 1 G IJ SOLR
INTRAMUSCULAR | Status: AC
  Filled 2019-04-09: qty 20

## 2019-04-09 MED ORDER — SIMVASTATIN 20 MG PO TABS
10.0000 mg | ORAL_TABLET | Freq: Every day | ORAL | Status: DC
  Administered 2019-04-10 – 2019-04-14 (×5): 10 mg via ORAL
  Filled 2019-04-09 (×5): qty 1

## 2019-04-09 MED ORDER — CEFAZOLIN SODIUM-DEXTROSE 2-3 GM-%(50ML) IV SOLR
INTRAVENOUS | Status: DC | PRN
  Administered 2019-04-09: 2 g via INTRAVENOUS

## 2019-04-09 MED ORDER — VITAMIN B-12 100 MCG PO TABS
100.0000 ug | ORAL_TABLET | Freq: Every day | ORAL | Status: DC
  Administered 2019-04-10 – 2019-04-15 (×6): 100 ug via ORAL
  Filled 2019-04-09 (×7): qty 1

## 2019-04-09 MED ORDER — LACTATED RINGERS IV SOLN
INTRAVENOUS | Status: DC
  Administered 2019-04-09 (×2): via INTRAVENOUS

## 2019-04-09 MED ORDER — THROMBIN 5000 UNITS EX SOLR
OROMUCOSAL | Status: DC | PRN
  Administered 2019-04-09: 11:00:00 via TOPICAL

## 2019-04-09 MED ORDER — SUCCINYLCHOLINE CHLORIDE 200 MG/10ML IV SOSY
PREFILLED_SYRINGE | INTRAVENOUS | Status: AC
  Filled 2019-04-09: qty 10

## 2019-04-09 MED ORDER — HYDROCODONE-ACETAMINOPHEN 5-325 MG PO TABS: 1.0000 | ORAL_TABLET | ORAL | Status: DC | PRN

## 2019-04-09 MED ORDER — HYDROMORPHONE HCL 1 MG/ML IJ SOLN
0.5000 mg | INTRAMUSCULAR | Status: DC | PRN
  Administered 2019-04-09 – 2019-04-10 (×3): 0.5 mg via INTRAVENOUS
  Filled 2019-04-09 (×3): qty 1

## 2019-04-09 MED ORDER — FENTANYL CITRATE (PF) 250 MCG/5ML IJ SOLN
INTRAMUSCULAR | Status: DC | PRN
  Administered 2019-04-09: 100 ug via INTRAVENOUS

## 2019-04-09 MED ORDER — SODIUM CHLORIDE 0.9 % IV SOLN
INTRAVENOUS | Status: DC | PRN
  Administered 2019-04-09: 1000 mL via INTRAVENOUS

## 2019-04-09 MED ORDER — ACETAMINOPHEN 650 MG RE SUPP
650.0000 mg | RECTAL | Status: DC | PRN
  Filled 2019-04-09: qty 1

## 2019-04-09 MED ORDER — PROMETHAZINE HCL 25 MG PO TABS: 12.5000 mg | ORAL_TABLET | ORAL | Status: DC | PRN

## 2019-04-09 MED ORDER — MUPIROCIN 2 % EX OINT
1.0000 "application " | TOPICAL_OINTMENT | Freq: Two times a day (BID) | CUTANEOUS | Status: AC
  Administered 2019-04-10 – 2019-04-13 (×8): 1 via NASAL
  Filled 2019-04-09 (×3): qty 22

## 2019-04-09 MED ORDER — LIDOCAINE-EPINEPHRINE 1 %-1:100000 IJ SOLN
INTRAMUSCULAR | Status: AC
  Filled 2019-04-09: qty 1

## 2019-04-09 MED ORDER — ONDANSETRON HCL 4 MG/2ML IJ SOLN: 4.0000 mg | INTRAMUSCULAR | Status: DC | PRN

## 2019-04-09 MED ORDER — FAMOTIDINE IN NACL 20-0.9 MG/50ML-% IV SOLN
20.0000 mg | Freq: Two times a day (BID) | INTRAVENOUS | Status: DC
  Administered 2019-04-09 – 2019-04-11 (×5): 20 mg via INTRAVENOUS
  Filled 2019-04-09 (×5): qty 50

## 2019-04-09 MED ORDER — ONDANSETRON HCL 4 MG/2ML IJ SOLN: 4.0000 mg | Freq: Once | INTRAMUSCULAR | Status: DC | PRN

## 2019-04-09 MED ORDER — PHENYLEPHRINE 40 MCG/ML (10ML) SYRINGE FOR IV PUSH (FOR BLOOD PRESSURE SUPPORT)
PREFILLED_SYRINGE | INTRAVENOUS | Status: AC
  Filled 2019-04-09: qty 10

## 2019-04-09 MED ORDER — SUGAMMADEX SODIUM 200 MG/2ML IV SOLN
INTRAVENOUS | Status: DC | PRN
  Administered 2019-04-09: 200 mg via INTRAVENOUS

## 2019-04-09 MED ORDER — PROPOFOL 10 MG/ML IV BOLUS
INTRAVENOUS | Status: DC | PRN
  Administered 2019-04-09 (×3): 50 ug via INTRAVENOUS

## 2019-04-09 MED ORDER — ACETAMINOPHEN 10 MG/ML IV SOLN: 1000.0000 mg | Freq: Once | INTRAVENOUS | Status: DC | PRN

## 2019-04-09 MED ORDER — FENTANYL CITRATE (PF) 250 MCG/5ML IJ SOLN
INTRAMUSCULAR | Status: AC
  Filled 2019-04-09: qty 5

## 2019-04-09 MED ORDER — IRBESARTAN 300 MG PO TABS
300.0000 mg | ORAL_TABLET | Freq: Every day | ORAL | Status: DC
  Administered 2019-04-10 – 2019-04-15 (×6): 300 mg via ORAL
  Filled 2019-04-09: qty 1
  Filled 2019-04-09: qty 2
  Filled 2019-04-09 (×5): qty 1

## 2019-04-09 MED ORDER — ROCURONIUM BROMIDE 100 MG/10ML IV SOLN
INTRAVENOUS | Status: DC | PRN
  Administered 2019-04-09: 30 mg via INTRAVENOUS

## 2019-04-09 MED ORDER — LEVETIRACETAM IN NACL 500 MG/100ML IV SOLN: 500.0000 mg | Freq: Two times a day (BID) | INTRAVENOUS | Status: DC

## 2019-04-09 MED ORDER — LIDOCAINE 2% (20 MG/ML) 5 ML SYRINGE
INTRAMUSCULAR | Status: AC
  Filled 2019-04-09: qty 5

## 2019-04-09 MED ORDER — 0.9 % SODIUM CHLORIDE (POUR BTL) OPTIME
TOPICAL | Status: DC | PRN
  Administered 2019-04-09 (×2): 1000 mL

## 2019-04-09 MED ORDER — NITROGLYCERIN 0.4 MG SL SUBL: 0.4000 mg | SUBLINGUAL_TABLET | SUBLINGUAL | Status: DC | PRN

## 2019-04-09 MED ORDER — ONDANSETRON HCL 4 MG/2ML IJ SOLN
INTRAMUSCULAR | Status: DC | PRN
  Administered 2019-04-09: 4 mg via INTRAVENOUS

## 2019-04-09 MED ORDER — SODIUM CHLORIDE 0.9 % IV SOLN
INTRAVENOUS | Status: DC | PRN
  Administered 2019-04-09: 11:00:00

## 2019-04-09 MED ORDER — ESCITALOPRAM OXALATE 10 MG PO TABS
10.0000 mg | ORAL_TABLET | Freq: Every day | ORAL | Status: DC
  Administered 2019-04-10 – 2019-04-15 (×6): 10 mg via ORAL
  Filled 2019-04-09 (×6): qty 1

## 2019-04-09 MED ORDER — EPHEDRINE 5 MG/ML INJ
INTRAVENOUS | Status: AC
  Filled 2019-04-09: qty 10

## 2019-04-09 MED ORDER — PROPOFOL 10 MG/ML IV BOLUS
INTRAVENOUS | Status: AC
  Filled 2019-04-09: qty 20

## 2019-04-09 SURGICAL SUPPLY — 68 items
ADH SKN CLS APL DERMABOND .7 (GAUZE/BANDAGES/DRESSINGS) ×1
BLADE CLIPPER SURG (BLADE) ×3 IMPLANT
BNDG GAUZE ELAST 4 BULKY (GAUZE/BANDAGES/DRESSINGS) IMPLANT
BUR ACORN 9.0 PRECISION (BURR) ×2 IMPLANT
BUR ACORN 9.0MM PRECISION (BURR) ×1
BUR MATCHSTICK NEURO 3.0 LAGG (BURR) IMPLANT
BUR SPIRAL ROUTER 2.3 (BUR) ×2 IMPLANT
BUR SPIRAL ROUTER 2.3MM (BUR) ×1
CANISTER SUCT 3000ML PPV (MISCELLANEOUS) ×3 IMPLANT
CATH VENTRIC 35X38 W/TROCAR LG (CATHETERS) ×4 IMPLANT
CLIP VESOCCLUDE MED 6/CT (CLIP) IMPLANT
COVER WAND RF STERILE (DRAPES) ×3 IMPLANT
DERMABOND ADVANCED (GAUZE/BANDAGES/DRESSINGS) ×2
DERMABOND ADVANCED .7 DNX12 (GAUZE/BANDAGES/DRESSINGS) IMPLANT
DRAPE NEUROLOGICAL W/INCISE (DRAPES) ×3 IMPLANT
DRAPE SHEET LG 3/4 BI-LAMINATE (DRAPES) ×3 IMPLANT
DRAPE SURG 17X23 STRL (DRAPES) IMPLANT
DRAPE WARM FLUID 44X44 (DRAPES) ×3 IMPLANT
DURAPREP 6ML APPLICATOR 50/CS (WOUND CARE) ×3 IMPLANT
ELECT REM PT RETURN 9FT ADLT (ELECTROSURGICAL) ×3
ELECTRODE REM PT RTRN 9FT ADLT (ELECTROSURGICAL) ×1 IMPLANT
EVACUATOR 1/8 PVC DRAIN (DRAIN) IMPLANT
EVACUATOR SILICONE 100CC (DRAIN) IMPLANT
GAUZE 4X4 16PLY RFD (DISPOSABLE) IMPLANT
GAUZE SPONGE 4X4 12PLY STRL (GAUZE/BANDAGES/DRESSINGS) ×3 IMPLANT
GLOVE BIO SURGEON STRL SZ7.5 (GLOVE) ×6 IMPLANT
GLOVE BIOGEL PI IND STRL 7.5 (GLOVE) ×2 IMPLANT
GLOVE BIOGEL PI INDICATOR 7.5 (GLOVE) ×4
GLOVE EXAM NITRILE LRG STRL (GLOVE) IMPLANT
GLOVE EXAM NITRILE XL STR (GLOVE) IMPLANT
GLOVE EXAM NITRILE XS STR PU (GLOVE) IMPLANT
GOWN STRL REUS W/ TWL LRG LVL3 (GOWN DISPOSABLE) ×2 IMPLANT
GOWN STRL REUS W/ TWL XL LVL3 (GOWN DISPOSABLE) IMPLANT
GOWN STRL REUS W/TWL 2XL LVL3 (GOWN DISPOSABLE) IMPLANT
GOWN STRL REUS W/TWL LRG LVL3 (GOWN DISPOSABLE) ×6
GOWN STRL REUS W/TWL XL LVL3 (GOWN DISPOSABLE)
HEMOSTAT POWDER KIT SURGIFOAM (HEMOSTASIS) ×3 IMPLANT
HEMOSTAT SURGICEL 2X14 (HEMOSTASIS) IMPLANT
HOOK DURA 1/2IN (MISCELLANEOUS) ×3 IMPLANT
KIT BASIN OR (CUSTOM PROCEDURE TRAY) ×3 IMPLANT
KIT DRAIN CSF ACCUDRAIN (MISCELLANEOUS) ×4 IMPLANT
KIT TURNOVER KIT B (KITS) ×3 IMPLANT
NEEDLE HYPO 22GX1.5 SAFETY (NEEDLE) ×3 IMPLANT
NS IRRIG 1000ML POUR BTL (IV SOLUTION) ×3 IMPLANT
PACK CRANIOTOMY CUSTOM (CUSTOM PROCEDURE TRAY) ×3 IMPLANT
PATTIES SURGICAL .5 X.5 (GAUZE/BANDAGES/DRESSINGS) IMPLANT
PATTIES SURGICAL .5 X3 (DISPOSABLE) IMPLANT
PATTIES SURGICAL 1X1 (DISPOSABLE) IMPLANT
SPONGE NEURO XRAY DETECT 1X3 (DISPOSABLE) IMPLANT
SPONGE SURGIFOAM ABS GEL 100 (HEMOSTASIS) ×3 IMPLANT
STAPLER VISISTAT 35W (STAPLE) ×3 IMPLANT
STOCKINETTE 6  STRL (DRAPES) ×2
STOCKINETTE 6 STRL (DRAPES) ×1 IMPLANT
SUT ETHILON 3 0 FSL (SUTURE) IMPLANT
SUT ETHILON 3 0 PS 1 (SUTURE) IMPLANT
SUT NURALON 4 0 TR CR/8 (SUTURE) ×9 IMPLANT
SUT STEEL 0 (SUTURE)
SUT STEEL 0 18XMFL TIE 17 (SUTURE) IMPLANT
SUT VIC AB 0 CT1 18XCR BRD8 (SUTURE) ×2 IMPLANT
SUT VIC AB 0 CT1 8-18 (SUTURE) ×6
SUT VIC AB 3-0 SH 8-18 (SUTURE) ×6 IMPLANT
TOWEL GREEN STERILE (TOWEL DISPOSABLE) ×3 IMPLANT
TOWEL GREEN STERILE FF (TOWEL DISPOSABLE) ×3 IMPLANT
TRAY FOLEY MTR SLVR 16FR STAT (SET/KITS/TRAYS/PACK) ×3 IMPLANT
TUBE CONNECTING 12'X1/4 (SUCTIONS) ×1
TUBE CONNECTING 12X1/4 (SUCTIONS) ×2 IMPLANT
UNDERPAD 30X30 (UNDERPADS AND DIAPERS) ×3 IMPLANT
WATER STERILE IRR 1000ML POUR (IV SOLUTION) ×3 IMPLANT

## 2019-04-09 NOTE — ED Notes (Signed)
Paged neurosurgery to rn April

## 2019-04-09 NOTE — Op Note (Signed)
PATIENT: Olivia Gray  DAY OF SURGERY: 04/09/19   PRE-OPERATIVE DIAGNOSIS:  Bilateral subdural hematomas   POST-OPERATIVE DIAGNOSIS:  Bilateral subdural hematomas   PROCEDURE:  Bilateral burr holes for evacuation of bilateral subdural hematomas   SURGEON:  Surgeon(s) and Role:    Judith Part, MD - Primary   ANESTHESIA: ETGA   BRIEF HISTORY: This is an 85 year old woman who presented with seizures. The patient was found to have bilateral, right greater than left, subdural hematomas. Given her multiple seizures and the large size of the right sided collection, I therefore recommended bilateral burr hole drainage of the subdural collections. This was discussed with the patient's husband as well as risks, benefits, and alternatives and wished to proceed with surgery.   OPERATIVE DETAIL: The patient was taken to the operating room and placed on the OR table in the supine position. A formal time out was performed with two patient identifiers and confirmed the operative site. Anesthesia was induced by the anesthesia team. The operative site was marked, hair was clipped with surgical clippers, the area was then prepped and draped in a sterile fashion.   A linear incision was placed in the right paramedian convexity. A burr hole was created, dura was opened, and subdural hematoma of mixed age was evacuated and copiously irrigated until irrigation returned clear. A ventricular catheter was then placed in the subdural space by using a Penfield #3 to protect the brain and direct the catheter parallel to the cortical surface. This was then tunneled and secured.  The same procedure was then repeated on the left side in the same manner. On the left, as expected, there was similarly a mixed density subdural of lower volume than the right.   All instrument and sponge counts were correct, the incision was then closed in layers. The patient was then returned to anesthesia for emergence. No apparent  complications at the completion of the procedure.   EBL:  108mL   DRAINS: Bilateral ventricular catheters in the subdural space   SPECIMENS: none   Judith Part, MD 04/09/19 11:06 AM

## 2019-04-09 NOTE — Procedures (Addendum)
Patient Name: Aster Dauer  MRN: [id number]  Epilepsy Attending: Lora Havens  Referring Physician/Provider: Dr Missy Sabins Date: 04/09/2019  Duration: 23.14mins  Patient history: 85yo F with BL subdural hematoma s/p burr hole, ams and seizure. EEG to evaluate for seizure  Level of alertness: awake, asleep  AEDs during EEG study: Keppra  Technical aspects: This EEG study was done with scalp electrodes positioned according to the 10-20 International system of electrode placement. Electrical activity was acquired at a sampling rate of 500Hz  and reviewed with a high frequency filter of 70Hz  and a low frequency filter of 1Hz . EEG data were recorded continuously and digitally stored.   DESCRIPTION: During awake state, no clear posterior dominant rhythm was seen.Sleep was characterized by sleep spindles (12-14Hz ), maximal frontocentral. EEG showed continuous generalized 3-5Hz  theta-delta slowing. Intermittent rhythmic 2-3Hz  delta slowing was also seen in left temporal region. Hyperventilation and photic stimulation were not performed.  ABNORMALITY - Continuous slow, generalized  - Intermittent rhythmic slow, left temporal  IMPRESSION: This study showed evidence of cortical dysfunction in left temporal region likely secondary to underlying SDH. Additionally there is evidence of moderate to severe diffuse encephalopathy. No seizures or definite epileptiform discharges were seen throughout the recording.  Shaneal Barasch [name] [name]

## 2019-04-09 NOTE — Progress Notes (Signed)
EEG complete - results pending 

## 2019-04-09 NOTE — Anesthesia Preprocedure Evaluation (Addendum)
Anesthesia Evaluation  Patient identified by MRN, date of birth, ID band Patient confused    Reviewed: Allergy & Precautions, NPO status , Patient's Chart, lab work & pertinent test results  Airway Mallampati: II  TM Distance: >3 FB Neck ROM: Full    Dental no notable dental hx.    Pulmonary neg pulmonary ROS,    Pulmonary exam normal breath sounds clear to auscultation       Cardiovascular hypertension, Pt. on medications and Pt. on home beta blockers + CAD and + CABG  Normal cardiovascular exam+ Valvular Problems/Murmurs  Rhythm:Regular Rate:Normal     Neuro/Psych negative neurological ROS  negative psych ROS   GI/Hepatic negative GI ROS, Neg liver ROS,   Endo/Other  negative endocrine ROS  Renal/GU negative Renal ROS     Musculoskeletal negative musculoskeletal ROS (+)   Abdominal   Peds  Hematology  (+) REFUSES BLOOD PRODUCTS, JEHOVAH'S WITNESSHLD   Anesthesia Other Findings SUBDURAL HEMATOMA  Reproductive/Obstetrics                            Anesthesia Physical Anesthesia Plan  ASA: IV  Anesthesia Plan: General   Post-op Pain Management:    Induction: Intravenous  PONV Risk Score and Plan: 3 and Ondansetron, Dexamethasone and Treatment may vary due to age or medical condition  Airway Management Planned: Oral ETT  Additional Equipment:   Intra-op Plan:   Post-operative Plan: Possible Post-op intubation/ventilation  Informed Consent:   Plan Discussed with: CRNA  Anesthesia Plan Comments: (Attempt to call husband on house as well as cell phone, no answer.)       Anesthesia Quick Evaluation

## 2019-04-09 NOTE — ED Notes (Signed)
[place] paged neurosurgery for admit orders.

## 2019-04-09 NOTE — H&P (Signed)
Neurosurgery H&P  Reason for Consult: Subdural hematoma Referring Physician: Tegeler  CC: Seizure  HPI: This is a 85 y.o. woman that presents with acute AMS. She was found found down today by her husband, obtunded with some clonic movement. Reportedly EMS or ED staff noticed the patient with a right gaze and some clonic movement of the RUE followed by continued right sided weakness. No known history of seizures, no history of similar events. +[id number] but no other anti-platelet or anti-coagulant medications.   ROS: A 14 point ROS was performed and is negative except as noted in the HPI.   PMHx:      Past Medical History:  Diagnosis Date  . Breast cancer ([place]) 01/29/2016   Left Breast Cancer  . Breast cancer of upper-outer quadrant of left female breast ([place]) 12/27/2015  . CAD, multiple vessel 2004   Myoview 2011: Mild Basal-mid Inferolateral "ischemia", EF ~76?% -- Anormal, but LOW RISK  . Essential hypertension   . History of radiation therapy 03/11/16-04/29/16   left breast/axilla 50.4 Gy in 28 fractions, boost of 10 Gy in 5 fractions  . Hyperlipidemia LDL goal <70    On statin.  . Legally blind   . Mitral valve prolapse 02/07/2008; April 2015   a) Mild Anterior MVP, with mild MR.;; b) Echo 07/2013: EF 55-60%, Gr 1 DD, no WMA, Mild central MR with late systolic prolapse   . Personal history of radiation therapy 2017   Left Breast Cancer  . Pre-diabetes   . Retinitis pigmentosa of both eyes     now essentially legally blind.  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 S/P CABG x 4 2004   SVG-LAD, SVG-D1, SVG-OM1, SVG-RPDA. (Native LIMA was 100% occluded)   FamHx:       Family History  Problem Relation Age of Onset  . Breast cancer Sister   . Breast cancer Sister    SocHx:  reports that she has never smoked. She has never used smokeless tobacco.  She reports that she does not drink alcohol or use drugs.  Exam: Vital signs in last 24 hours: Temp:  [97.7 F (36.5 C)] 97.7 F (36.5 C) (12/12 2052) Pulse Rate:  [80-87] 85 (12/12 2257) Resp:  [16-21] 16 (12/12 2257) BP: (141-161)/(56-71) 161/68 (12/12 2257) SpO2:  [96 %-100 %] 100 % (12/12 2257) General: Lying in hospital bed, appears confused and restless Head: normocephalic and atruamatic HEENT: neck supple Pulmonary: breathing room air comfortably, no evidence of increased work of breathing Cardiac: RRR Abdomen: S NT ND Extremities: warm and well perfused x4 Neuro: Somnolent, incorrectly answers yes/no, Ox zero, not FC, PERRL, gaze neutral, restless, withdraws to pain x4 but slower on the right than left.   Assessment and Plan: 85 y.o. woman with acute obtundation, clinical history most consistent with left hemispheric ictus then post-ictal period. CT head personally reviewed, which shows mixed density right sided SDH measuring 71mm in thickness with some layering of subacute blood. Smaller left sided SDH that appears more subacute, measuring 87mm.   Exam localizes to a left sided seizure, but right sided SDH is much larger. I therefore recommend bilateral burr hole evacuations to improve seizure control from the left hemisphere as well as resolve mass effect / brain compression on the right. Discussed with the husband regarding r/b/a and he thinks the patient would like to proceed if surgery if she were  able to answer. Of note, pt is a Jehova's witness and the husband confirmed that she would rather die than receive a blood transfusion.  I'd prefer to wait until morning to let her recover from her post-ictal state to increase the chances that she can be extubated immediately post-op.  -admit to neuro ICU overnight, OR in the morning for bilateral burr holes  -AED management deferred to neurology, pt being loaded w/ levetiracetam currently  Judith Part, MD

## 2019-04-09 NOTE — ED Notes (Addendum)
Dr. Leonel Ramsay w/ neurosurgery called back, stated for admitting orders call  Neurosurgery services.

## 2019-04-09 NOTE — Anesthesia Procedure Notes (Signed)
Procedure Name: Intubation Date/Time: 04/09/2019 10:03 AM Performed by: Clovis Cao, CRNA Pre-anesthesia Checklist: Patient identified, Emergency Drugs available, Suction available, Patient being monitored and Timeout performed Patient Re-evaluated:Patient Re-evaluated prior to induction Oxygen Delivery Method: Circle system utilized Preoxygenation: Pre-oxygenation with 100% oxygen Induction Type: IV induction Ventilation: Mask ventilation without difficulty Laryngoscope Size: Miller and 2 Grade View: Grade I Tube type: Oral Tube size: 7.5 mm Number of attempts: 1 Airway Equipment and Method: Stylet Placement Confirmation: ETT inserted through vocal cords under direct vision,  positive ETCO2 and breath sounds checked- equal and bilateral Secured at: 22 cm Tube secured with: Tape Dental Injury: Teeth and Oropharynx as per pre-operative assessment

## 2019-04-09 NOTE — ED Notes (Signed)
Pt's v/s equipment disconnected upon arrival to shift d/t pt agitation. Pt currently asleep and calm, placed back on monitor.

## 2019-04-09 NOTE — Anesthesia Postprocedure Evaluation (Signed)
Anesthesia Post Note  Patient: Olivia Gray  Procedure(s) Performed: BILATERAL BURR HOLES FOR EVACUATION SUBDURAL HEMATOMA (Bilateral Head)     Patient location during evaluation: PACU Anesthesia Type: General Level of consciousness: responds to stimulation Pain management: pain level controlled Vital Signs Assessment: post-procedure vital signs reviewed and stable Respiratory status: spontaneous breathing, nonlabored ventilation, respiratory function stable and patient connected to nasal cannula oxygen Cardiovascular status: blood pressure returned to baseline and stable Postop Assessment: no apparent nausea or vomiting Anesthetic complications: no    Last Vitals:  Vitals:   04/09/19 1300 04/09/19 1400  BP: 134/69 (!) 141/54  Pulse: 75 67  Resp: 13 13  Temp:    SpO2: 96% 97%    Last Pain:  Vitals:   04/09/19 1200  TempSrc: Axillary  PainSc:                  Karyl Kinnier Leza Apsey

## 2019-04-09 NOTE — Brief Op Note (Signed)
04/09/2019  11:05 AM  PATIENT:  Olivia Gray  85 y.o. female  PRE-OPERATIVE DIAGNOSIS:  SUBDURAL HEMATOMA  POST-OPERATIVE DIAGNOSIS:  SUBDURAL HEMATOMA  PROCEDURE:  Procedure(s): BILATERAL BURR HOLES FOR EVACUATION SUBDURAL HEMATOMA (Bilateral)  SURGEON:  Surgeon(s) and Role:    * Maycel Riffe, Joyice Faster, MD - Primary  PHYSICIAN ASSISTANT:   ASSISTANTS: none   ANESTHESIA:   general  EBL:  10cc  BLOOD ADMINISTERED:none  DRAINS: Bilateral ventricular catheters in subdural space   LOCAL MEDICATIONS USED:  LIDOCAINE   SPECIMEN:  No Specimen  DISPOSITION OF SPECIMEN:  N/A  COUNTS:  YES  TOURNIQUET:  * No tourniquets in log *  DICTATION: .Note written in EPIC  PLAN OF CARE: Admit to inpatient   PATIENT DISPOSITION:  PACU - hemodynamically stable.   Delay start of Pharmacological VTE agent (>24hrs) due to surgical blood loss or risk of bleeding: yes

## 2019-04-09 NOTE — Transfer of Care (Signed)
Immediate Anesthesia Transfer of Care Note  Patient: Olivia Gray  Procedure(s) Performed: BILATERAL BURR HOLES FOR EVACUATION SUBDURAL HEMATOMA (Bilateral Head)  Patient Location: PACU  Anesthesia Type:General  Level of Consciousness: drowsy  Airway & Oxygen Therapy: Patient Spontanous Breathing  Post-op Assessment: Report given to RN and Post -op Vital signs reviewed and stable  Post vital signs: Reviewed and stable  Last Vitals:  Vitals Value Taken Time  BP 166/59 04/09/19 1115  Temp    Pulse 78 04/09/19 1117  Resp 12 04/09/19 1117  SpO2 100 % 04/09/19 1117  Vitals shown include unvalidated device data.  Last Pain:  Vitals:   04/09/19 0800  TempSrc: Axillary  PainSc:          Complications: No apparent anesthesia complications

## 2019-04-10 ENCOUNTER — Other Ambulatory Visit: Payer: Self-pay

## 2019-04-10 DIAGNOSIS — S065X9A Traumatic subdural hemorrhage with loss of consciousness of unspecified duration, initial encounter: Principal | ICD-10-CM

## 2019-04-10 NOTE — Plan of Care (Signed)
  Problem: Clinical Measurements: Goal: Complications related to the disease process, condition or treatment will be avoided or minimized Outcome: Progressing   

## 2019-04-10 NOTE — Consult Note (Signed)
Physical Medicine and Rehabilitation Consult Reason for Consult: Decreased functional mobility Referring Physician: Dr. Venetia Constable   HPI: Olivia Gray is a 85 y.o. right-handed female with history of left breast cancer with lumpectomy and radiation therapy, CAD/CABG, hypertension, hyperlipidemia, mitral valve prolapse, legally blind.  Per chart review patient lives with spouse.  Two-level home.  Modified independent prior to admission requiring assistance due to limited vision.  Presented 04/09/2019 after being found down by her husband obtunded with some clonic movement and a right gaze.  CT of the head showed a large subacute subdural hematoma on the right measuring 17 mm in thickness 7 mm midline shift to the left.  Small left parietal low-density subdural fluid collection felt to be possibly subacute.  Negative cervical spine fracture.  Admission chemistries unremarkable except BUN 26, creatinine 1.28, CK within normal limits.  Underwent bilateral bur hole for evacuation of bilateral subdural hematomas 04/09/2019 per Dr. Venetia Constable.  Placed on Keppra for seizure prophylaxis.  Patient is on a mechanical soft diet.  Therapy evaluations completed with recommendations of physical medicine rehab consult.   Patient states that she lives with her husband.  He has trouble walking but does not use an assistive device  In terms of visual impairment patient states she can only sense light and dark.  Review of Systems  Constitutional: Negative for chills and fever.  HENT: Negative for hearing loss.   Eyes:       She is legally blind  Respiratory: Negative for cough and shortness of breath.   Cardiovascular: Positive for leg swelling. Negative for chest pain and palpitations.  Gastrointestinal: Positive for constipation. Negative for heartburn, nausea and vomiting.  Genitourinary: Negative for dysuria, flank pain and hematuria.  Musculoskeletal: Positive for joint pain and myalgias.  Skin:  Negative for rash.  Psychiatric/Behavioral: Positive for depression.  All other systems reviewed and are negative.  Past Medical History:  Diagnosis Date   Breast cancer ([place]) 01/29/2016   Left Breast Cancer   Breast cancer of upper-outer quadrant of left female breast ([place]) 12/27/2015   CAD, multiple vessel 2004   Myoview 2011: Mild Basal-mid Inferolateral "ischemia", EF ~76?% -- Anormal, but LOW RISK   Essential hypertension    History of radiation therapy 03/11/16-04/29/16   left breast/axilla 50.4 Gy in 28 fractions, boost of 10 Gy in 5 fractions   Hyperlipidemia LDL goal <70    On statin.   Legally blind    Mitral valve prolapse 02/07/2008; April 2015   a) Mild Anterior MVP, with mild MR.;; b) Echo 07/2013: EF 55-60%, Gr 1 DD, no WMA, Mild central MR with late systolic prolapse    Personal history of radiation therapy 2017   Left Breast Cancer   Pre-diabetes    Retinitis pigmentosa of both eyes     now essentially legally blind.  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 (Native LIMA was 100% occluded)   Past Surgical History:  Procedure Laterality Date   BREAST LUMPECTOMY Left 01/29/2016   BREAST LUMPECTOMY WITH RADIOACTIVE SEED LOCALIZATION Left 01/29/2016   Procedure: LEFT BREAST LUMPECTOMY WITH RADIOACTIVE SEED LOCALIZATION;  Surgeon: [name], MD;  Location: [facility];  Service: General;  Laterality: Left;   BREAST SURGERY Bilateral    cyst removal   CARDIAC CATHETERIZATION  05/22/2009  all grafts patent ,LV systolic fx normal,small -vessel diabetic disease in native vessels    CHOLECYSTECTOMY  02/05/2005   CORONARY ARTERY BYPASS GRAFT  07/21/2002   in [place] ; SVG-LAD, SVG-D1, SVG-OM1, SVG RPDA.   CRANIOTOMY Bilateral 04/09/2019   Procedure: BILATERAL BURR  HOLES FOR EVACUATION SUBDURAL HEMATOMA;  Surgeon: Judith Part, MD;  Location: [facility];  Service: Neurosurgery;  Laterality: Bilateral;   DOPPLER ECHOCARDIOGRAPHY  02/07/2008; April 2015   EF=>55%; LV normal; mild anterior MVP with mild MR.; b) 07/2013: EF 55-60%. Normal wall motion. Gr 1 DD, mild MR with late systolic prolapse. Normal central venous and pulmonary arterial pressures    NM MYOCAR PERF WALL MOTION  04/2009   EF 76%; mild ischeia basal inferolateral,mid inferoinlateral regions(s), abn pharmacologic nuc test, deffect indicte new ischemia,LV syst.fx normal --> cath showing no obstructive targets.   NM MYOVIEW LTD  June 2016   Normal perfusion. Normal study. LOW RISK. Normal EF (55%). Compared to prior study, no ischemic changes present.   Family History  Problem Relation Age of Onset   Breast cancer Sister    Breast cancer Sister    Social History:  reports that she has never smoked. She has never used smokeless tobacco. She reports that she does not drink alcohol or use drugs. Allergies: No Known Allergies Medications Prior to Admission  Medication Sig Dispense Refill   amLODipine (NORVASC) 10 MG tablet TAKE 1 TABLET (10 MG TOTAL) BY MOUTH DAILY. (Patient taking differently: Take 10 mg by mouth daily. ) 90 tablet 3   aspirin 81 MG tablet Take 81 mg by mouth daily.     carvedilol (COREG) 3.125 MG tablet TAKE 1 TABLET (3.125 MG TOTAL) BY MOUTH 2 (TWO) TIMES DAILY. (Patient taking differently: Take 3.125 mg by mouth 2 (two) times daily with a meal. ) 180 tablet 3   cholecalciferol (VITAMIN D) 1000 UNITS tablet Take 1,000 Units by mouth daily.     escitalopram (LEXAPRO) 10 MG tablet Take 10 mg by mouth daily.     letrozole (FEMARA) 2.5 MG tablet TAKE 1 TABLET BY MOUTH EVERY DAY (Patient taking differently: Take 2.5 mg by mouth daily. ) 30 tablet 3   nitroGLYCERIN (NITROSTAT) 0.4 MG SL tablet Place 1 tablet (0.4 mg total) under the tongue every 5 (five) minutes as  needed for chest pain. 25 tablet 6   simvastatin (ZOCOR) 10 MG tablet Take 10 mg by mouth at bedtime.      valsartan (DIOVAN) 320 MG tablet TAKE 1 TABLET DAILY. TO REPLACE IRBESARTAN. (Patient taking differently: Take 320 mg by mouth daily. ) 90 tablet 3   vitamin B-12 (CYANOCOBALAMIN) 100 MCG tablet Take 100 mcg by mouth daily.     cyanocobalamin 1000 MCG tablet Take 100 mcg by mouth daily.     simvastatin (ZOCOR) 20 MG tablet Take 1 tablet (20 mg total) by mouth at bedtime. (Patient not taking: Reported on 04/08/2019) 90 tablet 3    Home: [place] expects to be discharged to:: Private residence Living Arrangements: Spouse/significant other Available Help at Discharge: Family, Available 24 hours/day Type of Home: House Home Access: Stairs to enter Home Layout: Two level Alternate Level Stairs-Number of Steps: full flight  Bathroom Shower/Tub: Chiropodist: Standard Additional Comments: Pt's spouse reports their children are coming at the end of the week to assist with pt at discharge   Functional History: Prior Function Level of Independence: Needs assistance Gait / Transfers Assistance Needed: mod I  without AD ADL's / Homemaking Assistance Needed: mod I with ADLs, requires assist with IADLs due to low vision  Functional Status:  Mobility: Bed Mobility Overal bed mobility: Needs Assistance Bed Mobility: Supine to Sit Supine to sit: Min guard General bed mobility comments: assist to move to EOB  Transfers Overall transfer level: Needs assistance Transfers: Sit to/from Stand Sit to Stand: Min assist Stand pivot transfers: Min assist General transfer comment: pt a little impulsive, getting up multiple times from EOB without having lines well out of her way.  Often not having at least 1 hand on the bed/sitting surface to keep her stable when visually compromised. Ambulation/Gait Ambulation/Gait assistance: Mod assist Gait Distance (Feet):  80 Feet(then 100 feet with standing break to recoup balance) Assistive device: 1 person hand held assist Gait Pattern/deviations: Step-through pattern, Drifts right/left, Scissoring General Gait Details: mild instability with mild scissoring/adducted gait with R LE.  Consistent drift off to the right, lessened by moderate assist  to direct her straight. Gait velocity: slower and tentative.    ADL: ADL Overall ADL's : Needs assistance/impaired Eating/Feeding: Minimal assistance, Sitting Grooming: Wash/dry hands, Wash/dry face, Oral care, Minimal assistance, Bed level Grooming Details (indicate cue type and reason): assist for balance  Upper Body Bathing: Sitting, Minimal assistance Lower Body Bathing: Moderate assistance, Sit to/from stand Upper Body Dressing : Moderate assistance Lower Body Dressing: Moderate assistance Lower Body Dressing Details (indicate cue type and reason): Pt frequently looses balance to the Rt when attempting to don socks  Toilet Transfer: Moderate assistance, Ambulation, Comfort height toilet, Grab bars Toilet Transfer Details (indicate cue type and reason): assist for balance  Toileting- Clothing Manipulation and Hygiene: Moderate assistance, Sit to/from stand Toileting - Clothing Manipulation Details (indicate cue type and reason): assist for balance  Functional mobility during ADLs: Moderate assistance General ADL Comments: requires assist for balance, cognition and visual deficits   Cognition: Cognition Overall Cognitive Status: Impaired/Different from baseline Orientation Level: Oriented to person, Oriented to time, Disoriented to place, Disoriented to situation Cognition Arousal/Alertness: Awake/alert Behavior During Therapy: Flat affect Overall Cognitive Status: Impaired/Different from baseline Area of Impairment: Orientation, Attention, Following commands Orientation Level: Disoriented to, Time, Situation Current Attention Level:  Sustained Following Commands: Follows one step commands consistently General Comments: Pt thinks it's Wed, and was unaware that she had surgery   Blood pressure (!) 159/63, pulse 78, temperature 98.7 F (37.1 C), temperature source Oral, resp. rate 17, SpO2 99 %. Physical Exam  Constitutional: She is oriented to person, place, and time. She appears well-developed and well-nourished.  HENT:  Head: Normocephalic and atraumatic.  Eyes: Pupils are equal, round, and reactive to light. Conjunctivae and EOM are normal.  Cardiovascular: Normal rate, regular rhythm and normal heart sounds.  No murmur heard. Respiratory: Effort normal and breath sounds normal. No respiratory distress. She has no wheezes.  GI: Soft. Bowel sounds are normal. She exhibits no distension. There is no abdominal tenderness.  Neurological: She is alert and oriented to person, place, and time.  Patient is alert sitting up in chair.  Husband at bedside.  Follows simple commands.  Patient is legally blind.  She was able to tell me place and age.  She initially stated she lived with her mother and then self corrected herself as she lives with her husband. She is oriented to month and year but not day and date.  She is oriented to place.  Motor strength is 4/5 in the right deltoid bicep tricep grip hip flexor knee  extensor ankle dorsiflexor 5/5 in left deltoid bicep tricep grip hip flexion knee extension ankle dorsiflexion.  Skin: Skin is warm and dry.  Psychiatric: She has a normal mood and affect.    No results found for this or any previous visit (from the past 24 hour(s)). CT HEAD WO CONTRAST  Result Date: 04/09/2019 CLINICAL DATA:  Bilateral subdural hematoma drainage. EXAM: CT HEAD WITHOUT CONTRAST TECHNIQUE: Contiguous axial images were obtained from the base of the skull through the vertex without intravenous contrast. COMPARISON:  CT head 04/08/2019 FINDINGS: Brain: Trudee Kuster hole right parietal bone and placement of  subdural drain. Decreased subdural fluid on the right. Moderate pneumocephalus anteriorly in the subdural space. No acute hemorrhage on the right Burr hole left parietal bone and placement of subdural drain on the left. Low-density subdural fluid collection left frontal lobe now measures 7.5 cm increased in size. Left parietal low-density fluid collection is 9 mm in thickness and is approximately the same. No acute hemorrhage on the left Ventricle size normal. Mild shift of the midline structures to the left measuring approximately 4 mm unchanged. No acute ischemic infarction. Vascular: Negative for hyperdense vessel Skull: No acute skeletal abnormality.  Bilateral burr holes. Sinuses/Orbits: Negative Other: None IMPRESSION: Interval placement of right sided subdural drain. Decreased subdural fluid. Increased subdural gas anteriorly on the right. Left-sided subdural drain placed. Low-density subdural fluid has increased in the left frontal area at approximately the same in the parietal area. 4 mm midline shift to the left unchanged. Electronically Signed   By: Franchot Gallo M.D.   On: 04/09/2019 17:38   CT Head Wo Contrast  Result Date: 04/08/2019 CLINICAL DATA:  Altered mental status.  Found on floor EXAM: CT HEAD WITHOUT CONTRAST CT CERVICAL SPINE WITHOUT CONTRAST TECHNIQUE: Multidetector CT imaging of the head and cervical spine was performed following the standard protocol without intravenous contrast. Multiplanar CT image reconstructions of the cervical spine were also generated. COMPARISON:  CT head 08/28/2003 FINDINGS: CT HEAD FINDINGS Brain: Image quality degraded by motion.  Three attempts were made. Large right-sided subdural hematoma. This has layering hematocrit level with hypodense fluid anteriorly. The fluid collection measures up to 17 mm in thickness. 7 mm midline shift to the left. Small left parietal hypodense subdural fluid collection with small areas of mildly increased density. This could  be due to more recent hemorrhage or calcification. Ventricle size normal.  No acute infarct or mass lesion. Vascular: Negative for hyperdense vessel Skull: Skeletal evaluation limited for fracture evaluation due to motion. Sinuses/Orbits: Negative Other: None CT CERVICAL SPINE FINDINGS Alignment: Mild anterolisthesis C4-5 and C5-6 Skull base and vertebrae: Negative for fracture Soft tissues and spinal canal: Negative for mass or edema.  Goiter Disc levels: Multilevel disc and facet degeneration throughout the cervical spine. Right foraminal narrowing at C3-4 due to spurring. Upper chest: Lung apices clear bilaterally. Other: None IMPRESSION: Large subacute subdural hematoma on the right measuring 17 mm in thickness. 7 mm midline shift to left Small left parietal low-density subdural fluid collection which may be subacute. Negative for cervical spine fracture. These results were called by telephone at the time of interpretation on 04/08/2019 at 9:42 pm to provider [facility] , who verbally acknowledged these results. Electronically Signed   By: Franchot Gallo M.D.   On: 04/08/2019 21:43   CT Cervical Spine Wo Contrast  Result Date: 04/08/2019 CLINICAL DATA:  Altered mental status.  Found on floor EXAM: CT HEAD WITHOUT CONTRAST CT CERVICAL SPINE WITHOUT CONTRAST TECHNIQUE:  Multidetector CT imaging of the head and cervical spine was performed following the standard protocol without intravenous contrast. Multiplanar CT image reconstructions of the cervical spine were also generated. COMPARISON:  CT head 08/28/2003 FINDINGS: CT HEAD FINDINGS Brain: Image quality degraded by motion.  Three attempts were made. Large right-sided subdural hematoma. This has layering hematocrit level with hypodense fluid anteriorly. The fluid collection measures up to 17 mm in thickness. 7 mm midline shift to the left. Small left parietal hypodense subdural fluid collection with small areas of mildly increased density. This  could be due to more recent hemorrhage or calcification. Ventricle size normal.  No acute infarct or mass lesion. Vascular: Negative for hyperdense vessel Skull: Skeletal evaluation limited for fracture evaluation due to motion. Sinuses/Orbits: Negative Other: None CT CERVICAL SPINE FINDINGS Alignment: Mild anterolisthesis C4-5 and C5-6 Skull base and vertebrae: Negative for fracture Soft tissues and spinal canal: Negative for mass or edema.  Goiter Disc levels: Multilevel disc and facet degeneration throughout the cervical spine. Right foraminal narrowing at C3-4 due to spurring. Upper chest: Lung apices clear bilaterally. Other: None IMPRESSION: Large subacute subdural hematoma on the right measuring 17 mm in thickness. 7 mm midline shift to left Small left parietal low-density subdural fluid collection which may be subacute. Negative for cervical spine fracture. These results were called by telephone at the time of interpretation on 04/08/2019 at 9:42 pm to provider [facility] , who verbally acknowledged these results. Electronically Signed   By: Franchot Gallo M.D.   On: 04/08/2019 21:43   DG Chest Portable 1 View  Result Date: 04/08/2019 CLINICAL DATA:  Altered mental status EXAM: PORTABLE CHEST 1 VIEW COMPARISON:  06/15/2016 FINDINGS: Heart size is enlarged. The patient is status post prior median sternotomy. There is pleuroparenchymal scarring at the lung apices. No pneumothorax. No large pleural effusion. There is an acute appearing minimally displaced fracture involving the fourth rib laterally on the right. There is atelectasis versus scarring at the lung bases. IMPRESSION: 1. Acute appearing minimally displaced fracture involving the fourth rib laterally on the right. 2. Bibasilar atelectasis versus scarring. 3. No pneumothorax or large pleural effusion. 4. Cardiomegaly. Electronically Signed   By: Constance Holster M.D.   On: 04/08/2019 21:44   EEG adult  Result Date:  04/09/2019 Lora Havens, MD     04/10/2019 12:27 PM Patient Name: Anouk Bilberry MRN: [id number] Epilepsy Attending: Lora Havens Referring Physician/Provider: Dr Missy Sabins Date: 04/09/2019 Duration: 23.40mins Patient history: 85yo F with BL subdural hematoma s/p burr hole, ams and seizure. EEG to evaluate for seizure Level of alertness: awake, asleep AEDs during EEG study: Keppra Technical aspects: This EEG study was done with scalp electrodes positioned according to the 10-20 International system of electrode placement. Electrical activity was acquired at a sampling rate of 500Hz  and reviewed with a high frequency filter of 70Hz  and a low frequency filter of 1Hz . EEG data were recorded continuously and digitally stored. DESCRIPTION: During awake state, no clear posterior dominant rhythm was seen.Sleep was characterized by sleep spindles (12-14Hz ), maximal frontocentral. EEG showed continuous generalized 3-5Hz  theta-delta slowing. Intermittent rhythmic 2-3Hz  delta slowing was also seen in left temporal region. Hyperventilation and photic stimulation were not performed. ABNORMALITY - Continuous slow, generalized - Intermittent rhythmic slow, left temporal IMPRESSION: This study showed evidence of cortical dysfunction in left temporal region likely secondary to underlying SDH. Additionally there is evidence of moderate to severe diffuse encephalopathy. No seizures or definite epileptiform discharges were seen throughout the  recording. Priyanka [name] [name]     Assessment/Plan: Diagnosis: Bilateral subdural hematoma status post bur hole drainage 1. Does the need for close, 24 hr/day medical supervision in concert with the patient's rehab needs make it unreasonable for this patient to be served in a less intensive setting? Yes 2. Co-Morbidities requiring supervision/potential complications: Severe visual impairment 3. Due to bladder management, bowel management, safety, skin/wound care, disease  management, medication administration, pain management and patient education, does the patient require 24 hr/day rehab nursing? Yes 4. Does the patient require coordinated care of a physician, rehab nurse, therapy disciplines of PT, OT, speech to address physical and functional deficits in the context of the above medical diagnosis(es)? Yes Addressing deficits in the following areas: balance, endurance, locomotion, strength, transferring, bowel/bladder control, bathing, dressing, toileting, cognition and psychosocial support 5. Can the patient actively participate in an intensive therapy program of at least 3 hrs of therapy per day at least 5 days per week? Yes 6. The potential for patient to make measurable gains while on inpatient rehab is excellent 7. Anticipated functional outcomes upon discharge from inpatient rehab are supervision  with PT, supervision with OT, supervision with SLP. 8. Estimated rehab length of stay to reach the above functional goals is: 7 to 10 days 9. Anticipated discharge destination: Home 10. Overall Rehab/Functional Prognosis: good  RECOMMENDATIONS: This patient's condition is appropriate for continued rehabilitative care in the following setting: CIR Patient has agreed to participate in recommended program. Yes Note that insurance prior authorization may be required for reimbursement for recommended care.  Comment:   Cathlyn Parsons, PA-C 04/10/2019   "I have personally performed a face to face diagnostic evaluation of this patient.  Additionally, I have reviewed and concur with the physician assistant's documentation above."  Charlett Blake M.D. [facility] FAAPM&R (Sports Med, Neuromuscular Med) Diplomate Am Board of Electrodiagnostic Med

## 2019-04-10 NOTE — Progress Notes (Addendum)
Occupational Therapy Evaluation Patient Details Name: Olivia Gray MRN: [id number] DOB: [birth year] Today's Date: 04/10/2019    History of Present Illness This 85 y.o. female admitted after being found doun by her spouse, obtunded. CT of head showed Rt SDH, and small Lt SDH.  She underwent bil. burr hole evacuations.     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 She demonstrates the below listed deficits and will benefit from continued OT to maximize safety and independence with BADLs.  Pt currently presents to OT with impaired balance, generalized weakness, mild Rt sided weakness, impaired cognition and h/o low vision.  She currently requires min - mod A for ADLs and functional mobility.  Pt lives with her spouse, who is supportive and was mod I PTA.  Feel she would benefit from CIR level therapies to allow her to maximize safety and independence with ADLs to return home safely with spouse.       Follow Up Recommendations  CIR;Supervision/Assistance - 24 hour    Equipment Recommendations  None recommended by OT    Recommendations for Other Services Rehab consult     Precautions / Restrictions Precautions Precautions: Fall Precaution Comments: impaired balance as well as visual deficits       Mobility Bed Mobility Overal bed mobility: Needs Assistance Bed Mobility: Supine to Sit     Supine to sit: Min guard     General bed mobility comments: assist to move to EOB   Transfers Overall transfer level: Needs assistance   Transfers: Sit to/from Stand Sit to Stand: Min assist Stand pivot transfers: Min assist       General transfer comment: pt a little impulsive, getting up multiple times from EOB without having lines well out of her way.  Often not having at least 1 hand on the bed/sitting surface to keep her stable when visually compromised.    Balance  Overall balance assessment: Needs assistance Sitting-balance support: No upper extremity supported Sitting balance-Leahy Scale: Fair Sitting balance - Comments: making frequent adjustments at EOB, some standing up and sitting back  without therapist ready to guard.   Standing balance support: Single extremity supported;No upper extremity supported Standing balance-Leahy Scale: Poor Standing balance comment: use of legs against the bed frame or use of UE's for stability.                           ADL either performed or assessed with clinical judgement   ADL Overall ADL's : Needs assistance/impaired Eating/Feeding: Minimal assistance;Sitting   Grooming: Wash/dry hands;Wash/dry face;Oral care;Minimal assistance;Bed level Grooming Details (indicate cue type and reason): assist for balance  Upper Body Bathing: Sitting;Minimal assistance   Lower Body Bathing: Moderate assistance;Sit to/from stand   Upper Body Dressing : Moderate assistance   Lower Body Dressing: Moderate assistance Lower Body Dressing Details (indicate cue type and reason): Pt frequently looses balance to the Rt when attempting to don socks  Toilet Transfer: Moderate assistance;Ambulation;Comfort height toilet;Grab bars Toilet Transfer Details (indicate cue type and reason): assist for balance  Toileting- Clothing Manipulation and Hygiene: Moderate assistance;Sit to/from stand Toileting - Clothing Manipulation Details (indicate cue type and reason): assist for balance      Functional mobility during ADLs: Moderate assistance General ADL Comments: requires assist for balance, cognition and visual deficits      Vision Baseline Vision/History: Legally  blind Patient Visual Report: No change from baseline Additional Comments: Pt reports she can see some shadows      Perception Perception Perception Tested?: Yes   Praxis Praxis Praxis tested?: Within functional limits    Pertinent Vitals/Pain Pain  Assessment: Faces Faces Pain Scale: No hurt     Hand Dominance Right   Extremity/Trunk Assessment Upper Extremity Assessment Upper Extremity Assessment: RUE deficits/detail RUE Deficits / Details: grossly 4-/5 - 5/5    Lower Extremity Assessment Lower Extremity Assessment: RLE deficits/detail;LLE deficits/detail RLE Deficits / Details: grossly weak in hip flexors /hams at 4/5 RLE Coordination: decreased fine motor LLE Deficits / Details: generally WFL   Cervical / Trunk Assessment Cervical / Trunk Assessment: Normal   Communication Communication Communication: No difficulties   Cognition Arousal/Alertness: Awake/alert Behavior During Therapy: Flat affect Overall Cognitive Status: Impaired/Different from baseline Area of Impairment: Orientation;Attention;Following commands                 Orientation Level: Disoriented to;Time;Situation Current Attention Level: Sustained   Following Commands: Follows one step commands consistently       General Comments: Pt thinks it's Wed, and was unaware that she had surgery    General Comments  Pt's spouse present.  Discussed results of eval with him as well as recommendation for CIR.     Exercises     Shoulder Instructions      Home Living Family/patient expects to be discharged to:: Private residence Living Arrangements: Spouse/significant other Available Help at Discharge: Family;Available 24 hours/day Type of Home: House Home Access: Stairs to enter     Home Layout: Two level Alternate Level Stairs-Number of Steps: full flight    Bathroom Shower/Tub: Teacher, early years/pre: Standard         Additional Comments: Pt's spouse reports their children are coming at the end of the week to assist with pt at discharge       Prior Functioning/Environment Level of Independence: Needs assistance  Gait / Transfers Assistance Needed: mod I without AD ADL's / Homemaking Assistance Needed: mod I with ADLs,  requires assist with IADLs due to low vision             OT Problem List: Decreased strength;Decreased activity tolerance;Impaired balance (sitting and/or standing);Impaired vision/perception;Decreased cognition;Decreased safety awareness;Decreased knowledge of use of DME or AE;Impaired UE functional use      OT Treatment/Interventions: Self-care/ADL training;Neuromuscular education;DME and/or AE instruction;Therapeutic exercise;Therapeutic activities;Cognitive remediation/compensation;Balance training;Patient/family education    OT Goals(Current goals can be found in the care plan section) Acute Rehab OT Goals Patient Stated Goal: pt and pt's husband want to get pt home. OT Goal Formulation: With patient/family Time For Goal Achievement: 04/24/19 Potential to Achieve Goals: Good ADL Goals Pt Will Perform Grooming: with supervision;standing Pt Will Perform Upper Body Bathing: with set-up;sitting Pt Will Perform Lower Body Bathing: with supervision;sit to/from stand Pt Will Perform Upper Body Dressing: with set-up;sitting Pt Will Perform Lower Body Dressing: with supervision;sit to/from stand Pt Will Transfer to Toilet: with supervision;ambulating;regular height toilet;grab bars Pt Will Perform Toileting - Clothing Manipulation and hygiene: with supervision;sit to/from stand  OT Frequency: Min 2X/week   Barriers to D/C:            Co-evaluation PT/OT/SLP Co-Evaluation/Treatment: Yes Reason for Co-Treatment: For patient/therapist safety PT goals addressed during session: Mobility/safety with mobility OT goals addressed during session: ADL's and self-care      AM-PAC OT "6 Clicks" Daily Activity     Outcome Measure Help  from another person eating meals?: A Little Help from another person taking care of personal grooming?: A Little Help from another person toileting, which includes using toliet, bedpan, or urinal?: A Lot Help from another person bathing (including washing,  rinsing, drying)?: A Lot Help from another person to put on and taking off regular upper body clothing?: A Lot Help from another person to put on and taking off regular lower body clothing?: A Lot 6 Click Score: 14   End of Session Nurse Communication: Mobility status  Activity Tolerance: Patient tolerated treatment well Patient left: in chair;with call bell/phone within reach;with chair alarm set;with family/visitor present  OT Visit Diagnosis: Unsteadiness on feet (R26.81);Muscle weakness (generalized) (M62.81);Cognitive communication deficit (R41.841);Low vision, both eyes (H54.2)                Time: [id number] OT Time Calculation (min): 43 min Charges:    1 OT eval mod intensity; 1 visit   Nilsa Nutting., OTR/L Acute Rehabilitation Services Pager [phone] Office [phone]   Lucille Passy M 04/10/2019, 1:31 PM

## 2019-04-10 NOTE — Progress Notes (Addendum)
Reason for consult: Seizures  Subjective: Patient awake and following commands.  No further seizures noted.   ROS:  Unable to obtain due to poor mental status  Examination  Vital signs in last 24 hours: Temp:  [97.3 F (36.3 C)-100.6 F (38.1 C)] 98.7 F (37.1 C) (12/14 0800) Pulse Rate:  [66-108] 66 (12/14 0800) Resp:  [11-22] 14 (12/14 0800) BP: (120-166)/(51-86) 120/86 (12/14 0800) SpO2:  [92 %-100 %] 98 % (12/14 0800)  General: lying in bed CVS: pulse-normal rate and rhythm RS: breathing comfortably Extremities: normal   Neuro: MS: Alert, oriented, follows commands CN: pupils equal and reactive, blind in both eyes, face symmetric, tongue midline, normal sensation over face, Motor: 5/5 strength in all 4 extremities Reflexes:, plantars: flexor Coordination: normal Gait: not tested  Basic Metabolic Panel: Recent Labs  Lab 04/08/19 2055  NA 141  K 4.4  CL 110  CO2 17*  GLUCOSE 157*  BUN 26*  CREATININE 1.28*  CALCIUM 9.5    CBC: Recent Labs  Lab 04/08/19 2055  WBC 6.4  HGB 12.1  HCT 38.2  MCV 93.6  PLT 200     Coagulation Studies: No results for input(s): LABPROT, INR in the last 72 hours.  Imaging Reviewed: CT head   DESCRIPTION: During awake state, no clear posterior dominant rhythm was seen.Sleep was characterized by sleep spindles (12-14Hz ), maximal frontocentral. EEG showed continuous generalized 3-5Hz  theta-delta slowing. Intermittent rhythmic 2-3Hz  delta slowing was also seen in left temporal region. Hyperventilation and photic stimulation were not performed.  ABNORMALITY - Continuous slow, generalized  - Intermittent rhythmic slow, left temporal  IMPRESSION: This study showed evidence of cortical dysfunction in left temporal region likely secondary to underlying SDH. Additionally there is evidence of moderate to severe diffuse encephalopathy. No seizures or definite epileptiform discharges were seen throughout the  recording.   ASSESSMENT AND PLAN  85 year old female presented to the ED after being found down, having jerking movements of her right upper extremity with right gaze.  CT head revealed bilateral subdural hemorrhage, 17 mm acute to subacute right-sided hemorrhage status post bilateral bur hole evacuations. Patient doing well postop has had no further clinical seizures.  Routine EEG obtained, shows intermittent rhythmic slowing in the left temporal lobe but no seizures.   Seizures 2/2 bilateral SDH  Routine EEG shows left temporal rhythmic slowing  Continue Keppra 500mg  BID on discharge  Seizure precations Regarding subdural hemorrhage management, per neurosurgery.  Agree with holding aspirin. Please call back if patient has worsening mental status or clinical seizure activity Outpatient neurology follow up in 4-6 weeks   Per [facility] statutes, patients with seizures are not allowed to drive until they have been seizure-free for six months. Use caution when using heavy equipment or power tools. Avoid working on ladders or at heights. Take showers instead of baths. Ensure the water temperature is not too high on the home water heater. Do not go swimming alone. Do not lock yourself in a room alone (i.e. bathroom). When caring for infants or small children, sit down when holding, feeding, or changing them to minimize risk of injury to the child in the event you have a seizure. Maintain good sleep hygiene. Avoid alcohol.    If Olivia Gray has another seizure, call 911 and bring them back to the ED if:       A.  The seizure lasts longer than 5 minutes.            B.  The patient  doesn't wake shortly after the seizure or has new problems such as difficulty seeing, speaking or moving following the seizure       C.  The patient was injured during the seizure       D.  The patient has a temperature over 102 F (39C)       E.  The patient vomited during the seizure and now is having trouble  breathing   Olivia Gray [facility] Pager Number [phone] For questions after 7pm please refer to AMION to reach the Neurologist on call

## 2019-04-10 NOTE — Progress Notes (Addendum)
Physical Therapy Evaluation Patient Details Name: Olivia Gray MRN: [id number] DOB: [birth year] Today's Date: 04/10/2019   History of Present Illness  This 85 y.o. female admitted after being found down by her spouse, obtunded. CT of head showed Rt SDH, and small Lt SDH.  She underwent bil. burr hole evacuations.     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  Pt presently at a min assist to mod assist for basic mobility and gait. Marland Kitchen  Pt currently limited functionally due to the problems listed. ( See problems list.)   Pt will benefit from PT to maximize function and safety in order to get ready for next venue listed below.     Follow Up Recommendations CIR;Supervision/Assistance - 24 hour    Equipment Recommendations  None recommended by PT    Recommendations for Other Services Rehab consult     Precautions / Restrictions Precautions Precautions: Fall Precaution Comments: impaired balance as well as visual deficits       Mobility  Bed Mobility Overal bed mobility: Needs Assistance Bed Mobility: Supine to Sit     Supine to sit: Min guard     General bed mobility comments: assist to move to EOB   Transfers Overall transfer level: Needs assistance   Transfers: Sit to/from Stand Sit to Stand: Min assist         General transfer comment: pt a little impulsive, getting up multiple times from EOB without having lines well out of her way.  Often not having at least 1 hand on the bed/sitting surface to keep her stable when visually compromised.  Ambulation/Gait Ambulation/Gait assistance: Mod assist Gait Distance (Feet): 80 Feet(then 100 feet with standing break to recoup balance) Assistive device: 1 person hand held assist Gait Pattern/deviations: Step-through pattern;Drifts right/left;Scissoring Gait  velocity: slower and tentative.   General Gait Details: mild instability with mild scissoring/adducted gait with R LE.  Consistent drift off to the right, lessened by moderate assist  to direct her straight.  Stairs            Wheelchair Mobility    Modified Rankin (Stroke Patients Only)       Balance Overall balance assessment: Needs assistance Sitting-balance support: No upper extremity supported Sitting balance-Leahy Scale: Fair Sitting balance - Comments: making frequent adjustments at EOB, some standing up and sitting back  without therapist ready to guard.   Standing balance support: Single extremity supported;No upper extremity supported Standing balance-Leahy Scale: Poor Standing balance comment: use of legs against the bed frame or use of UE's for stability.                             Pertinent Vitals/Pain Pain Assessment: Faces Faces Pain Scale: No hurt    Home Living Family/patient expects to be discharged to:: Private residence Living Arrangements: Spouse/significant other Available Help at Discharge: Family;Available 24 hours/day Type of Home: House Home Access: Stairs to enter     Home Layout: Two level   Additional Comments: Pt's spouse reports their children are coming at the end of the week to assist with pt at discharge     Prior Function Level of Independence: Needs assistance   Gait / Transfers Assistance Needed: mod I without AD  ADL's / Homemaking Assistance Needed: mod I with  ADLs, requires assist with IADLs due to low vision         Hand Dominance   Dominant Hand: Right    Extremity/Trunk Assessment   Upper Extremity Assessment Upper Extremity Assessment: RUE deficits/detail RUE Deficits / Details: grossly 4-/5 - 5/5     Lower Extremity Assessment Lower Extremity Assessment: RLE deficits/detail;LLE deficits/detail RLE Deficits / Details: grossly weak in hip flexors /hams at 4/5 RLE Coordination: decreased fine  motor LLE Deficits / Details: generally WFL    Cervical / Trunk Assessment Cervical / Trunk Assessment: Normal  Communication   Communication: No difficulties  Cognition Arousal/Alertness: Awake/alert Behavior During Therapy: Flat affect Overall Cognitive Status: Impaired/Different from baseline Area of Impairment: Orientation;Attention;Following commands                 Orientation Level: Disoriented to;Time;Situation Current Attention Level: Sustained   Following Commands: Follows one step commands consistently       General Comments: Pt thinks it's Wed, and was unaware that she had surgery       General Comments      Exercises     Assessment/Plan    PT Assessment Patient needs continued PT services  PT Problem List Decreased strength;Decreased activity tolerance;Decreased balance;Decreased mobility;Decreased knowledge of use of DME       PT Treatment Interventions DME instruction;Gait training;Functional mobility training;Therapeutic activities;Balance training;Patient/family education    PT Goals (Current goals can be found in the Care Plan section)  Acute Rehab PT Goals Patient Stated Goal: pt and pt's husband want to get pt home. PT Goal Formulation: With patient/family Time For Goal Achievement: 04/24/19 Potential to Achieve Goals: Good    Frequency Min 3X/week   Barriers to discharge        Co-evaluation PT/OT/SLP Co-Evaluation/Treatment: Yes Reason for Co-Treatment: For patient/therapist safety PT goals addressed during session: Mobility/safety with mobility OT goals addressed during session: ADL's and self-care       AM-PAC PT "6 Clicks" Mobility  Outcome Measure Help needed turning from your back to your side while in a flat bed without using bedrails?: A Little Help needed moving from lying on your back to sitting on the side of a flat bed without using bedrails?: A Little Help needed moving to and from a bed to a chair (including a  wheelchair)?: A Little Help needed standing up from a chair using your arms (e.g., wheelchair or bedside chair)?: A Little Help needed to walk in hospital room?: A Lot Help needed climbing 3-5 steps with a railing? : A Lot 6 Click Score: 16    End of Session   Activity Tolerance: Patient tolerated treatment well Patient left: in chair;with call bell/phone within reach;with chair alarm set;with family/visitor present Nurse Communication: Mobility status PT Visit Diagnosis: Unsteadiness on feet (R26.81);Muscle weakness (generalized) (M62.81);Difficulty in walking, not elsewhere classified (R26.2)    Time: 1123-1204 PT Time Calculation (min) (ACUTE ONLY): 41 min   Charges:   PT Evaluation $PT Eval Moderate Complexity: 1 Mod PT Treatments $Gait Training: 8-22 mins        04/10/2019  Donnella Sham, PT Acute Rehabilitation Services [phone]  (pager) [phone]  (office)  Olivia Gray 04/10/2019, 1:04 PM

## 2019-04-10 NOTE — Evaluation (Signed)
Clinical/Bedside Swallow Evaluation Patient Details  Name: Olivia Gray MRN: [id number] Date of Birth: 13-Jun-1932  Today's Date: 04/10/2019 Time: SLP Start Time (ACUTE ONLY): 1244 SLP Stop Time (ACUTE ONLY): 1258 SLP Time Calculation (min) (ACUTE ONLY): 14 min  Past Medical History:  Past Medical History:  Diagnosis Date  . Breast cancer ([place]) 01/29/2016   Left Breast Cancer  . Breast cancer of upper-outer quadrant of left female breast ([place]) 12/27/2015  . CAD, multiple vessel 2004   Myoview 2011: Mild Basal-mid Inferolateral "ischemia", EF ~76?% -- Anormal, but LOW RISK  . Essential hypertension   . History of radiation therapy 03/11/16-04/29/16   left breast/axilla 50.4 Gy in 28 fractions, boost of 10 Gy in 5 fractions  . Hyperlipidemia LDL goal <70    On statin.  . Legally blind   . Mitral valve prolapse 02/07/2008; April 2015   a) Mild Anterior MVP, with mild MR.;; b) Echo 07/2013: EF 55-60%, Gr 1 DD, no WMA, Mild central MR with late systolic prolapse   . Personal history of radiation therapy 2017   Left Breast Cancer  . Pre-diabetes   . Retinitis pigmentosa of both eyes     now essentially legally blind.  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 S/P CABG x 4 2004   SVG-LAD, SVG-D1, SVG-OM1, SVG-RPDA. (Native LIMA was 100% occluded)   Past Surgical History:  Past Surgical History:  Procedure Laterality Date  . BREAST LUMPECTOMY Left 01/29/2016  . BREAST LUMPECTOMY WITH RADIOACTIVE SEED LOCALIZATION Left 01/29/2016   Procedure: LEFT BREAST LUMPECTOMY WITH RADIOACTIVE SEED LOCALIZATION;  Surgeon: [name], MD;  Location: [facility];  Service: General;  Laterality: Left;  . BREAST SURGERY Bilateral    cyst removal  . CARDIAC CATHETERIZATION  05/22/2009   all grafts patent ,LV systolic fx normal,small -vessel diabetic disease in native vessels    . CHOLECYSTECTOMY  02/05/2005  . CORONARY ARTERY BYPASS GRAFT  07/21/2002   in [place] ; SVG-LAD, SVG-D1, SVG-OM1, SVG RPDA.  Marland Kitchen CRANIOTOMY Bilateral 04/09/2019   Procedure: BILATERAL BURR HOLES FOR EVACUATION SUBDURAL HEMATOMA;  Surgeon: Judith Part, MD;  Location: [facility];  Service: Neurosurgery;  Laterality: Bilateral;  . DOPPLER ECHOCARDIOGRAPHY  02/07/2008; April 2015   EF=>55%; LV normal; mild anterior MVP with mild MR.; b) 07/2013: EF 55-60%. Normal wall motion. Gr 1 DD, mild MR with late systolic prolapse. Normal central venous and pulmonary arterial pressures   . NM MYOCAR PERF WALL MOTION  04/2009   EF 76%; mild ischeia basal inferolateral,mid inferoinlateral regions(s), abn pharmacologic nuc test, deffect indicte new ischemia,LV syst.fx normal --> cath showing no obstructive targets.  Marland Kitchen NM MYOVIEW LTD  June 2016   Normal perfusion. Normal study. LOW RISK. Normal EF (55%). Compared to prior study, no ischemic changes present.   HPI:  Pt is an 85 y.o. woman p/w seizures, CTH w/ b/l SDH, 12/13 s/p bilateral burr hole drainage. Pt was intubated for procedure only. PMH: CABG x4, legally blind, HLD, breast ca s/p surgery and XRT, CAD, HTN   Assessment / Plan / Recommendation Clinical Impression  Pt's swallow appears functional given missing dentition. She says that she wears dentures, which are not present for this evaluation. Given her cognitive status (although reportedly improved from admission per chart review), recommend starting Dys 3 diet and thin liquids. Pending tolerance and possible access to her dentures,  she could be appropriate for further upgrades. SLP will follow to assess.  SLP Visit Diagnosis: Dysphagia, unspecified (R13.10)    Aspiration Risk  Mild aspiration risk    Diet Recommendation Dysphagia 3 (Mech soft);Thin liquid   Liquid Administration via: Cup;Straw Medication Administration: Whole meds with puree Supervision: Staff to assist with self  feeding Compensations: Minimize environmental distractions;Slow rate;Small sips/bites Postural Changes: Seated upright at 90 degrees;Remain upright for at least 30 minutes after po intake    Other  Recommendations Oral Care Recommendations: Oral care BID   Follow up Recommendations (tba)      Frequency and Duration min 2x/week  1 week       Prognosis Prognosis for Safe Diet Advancement: Good      Swallow Study   General HPI: Pt is an 85 y.o. woman p/w seizures, CTH w/ b/l SDH, 12/13 s/p bilateral burr hole drainage. Pt was intubated for procedure only. PMH: CABG x4, legally blind, HLD, breast ca s/p surgery and XRT, CAD, HTN Type of Study: Bedside Swallow Evaluation Previous Swallow Assessment: none in chart Diet Prior to this Study: NPO Temperature Spikes Noted: Yes(100.6) Respiratory Status: Room air History of Recent Intubation: (for procedure only 12/13) Behavior/Cognition: Alert;Cooperative;Pleasant mood Oral Cavity Assessment: Within Functional Limits Oral Care Completed by SLP: Yes Oral Cavity - Dentition: Missing dentition;Dentures, not available Vision: Impaired for self-feeding Self-Feeding Abilities: Able to feed self;Needs assist Patient Positioning: Upright in chair Baseline Vocal Quality: Normal Volitional Swallow: Able to elicit    Oral/Motor/Sensory Function Overall Oral Motor/Sensory Function: Within functional limits   Ice Chips Ice chips: Within functional limits Presentation: Spoon   Thin Liquid Thin Liquid: Within functional limits Presentation: Spoon;Straw    Nectar Thick Nectar Thick Liquid: Not tested   Honey Thick Honey Thick Liquid: Not tested   Puree Puree: Within functional limits Presentation: Spoon   Solid     Solid: Within functional limits Presentation: Self Lewayne Bunting Tarnisha Kachmar 04/10/2019,1:06 PM  Pollyann Glen, M.A. [place] Acute Environmental education officer [phone] Office [phone]

## 2019-04-10 NOTE — Progress Notes (Signed)
Neurosurgery Service Progress Note  Subjective: No acute events overnight, mental status significantly improved post-op   Objective: Vitals:   04/10/19 0400 04/10/19 0500 04/10/19 0600 04/10/19 0800  BP: 130/62 140/68 (!) 142/72 120/86  Pulse: 74 66 68 66  Resp: 16 13 13 14   Temp:    98.7 F (37.1 C)  TempSrc:    Oral  SpO2: 97% 98% 99% 98%   Temp (24hrs), Avg:98.9 F (37.2 C), Min:97.3 F (36.3 C), Max:100.6 F (38.1 C)  CBC Latest Ref Rng & Units 04/08/2019 12/27/2018 08/09/2017  WBC 4.0 - 10.5 K/uL 6.4 3.7(L) 3.6(L)  Hemoglobin 12.0 - 15.0 g/dL 12.1 12.3 12.0  Hematocrit 36.0 - 46.0 % 38.2 37.4 35.6  Platelets 150 - 400 K/uL 200 176 189   BMP Latest Ref Rng & Units 04/08/2019 12/27/2018 08/09/2017  Glucose 70 - 99 mg/dL 157(H) 97 90  BUN 8 - 23 mg/dL 26(H) 15 11  Creatinine 0.44 - 1.00 mg/dL 1.28(H) 1.06(H) 0.93  Sodium 135 - 145 mmol/L 141 142 141  Potassium 3.5 - 5.1 mmol/L 4.4 4.4 4.1  Chloride 98 - 111 mmol/L 110 109 111(H)  CO2 22 - 32 mmol/L 17(L) 23 24  Calcium 8.9 - 10.3 mg/dL 9.5 9.4 9.6    Intake/Output Summary (Last 24 hours) at 04/10/2019 1058 Last data filed at 04/10/2019 0900 Gross per 24 hour  Intake 523.06 ml  Output 1001 ml  Net -477.94 ml    Current Facility-Administered Medications:  .  0.9 %  sodium chloride infusion, , Intravenous, PRN, Judith Part, MD, Last Rate: 10 mL/hr at 04/10/19 0900, Rate Verify at 04/10/19 0900 .  acetaminophen (TYLENOL) tablet 650 mg, 650 mg, Oral, Q4H PRN **OR** acetaminophen (TYLENOL) suppository 650 mg, 650 mg, Rectal, Q4H PRN, Josip Merolla A, MD .  amLODipine (NORVASC) tablet 10 mg, 10 mg, Oral, Daily, Kristan Brummitt A, MD .  carvedilol (COREG) tablet 3.125 mg, 3.125 mg, Oral, BID WC, Terea Neubauer, Joyice Faster, MD .  Chlorhexidine Gluconate Cloth 2 % PADS 6 each, 6 each, Topical, Daily, Judith Part, MD, 6 each at 04/10/19 1014 .  docusate sodium (COLACE) capsule 100 mg, 100 mg, Oral, BID, Tanasha Menees,  Mavis Fichera A, MD .  escitalopram (LEXAPRO) tablet 10 mg, 10 mg, Oral, Daily, Damyia Strider A, MD .  famotidine (PEPCID) IVPB 20 mg premix, 20 mg, Intravenous, Q12H, Judith Part, MD, Stopped at 04/09/19 2215 .  HYDROcodone-acetaminophen (NORCO/VICODIN) 5-325 MG per tablet 1 tablet, 1 tablet, Oral, Q4H PRN, Judith Part, MD .  HYDROmorphone (DILAUDID) injection 0.5 mg, 0.5 mg, Intravenous, Q3H PRN, Judith Part, MD, 0.5 mg at 04/10/19 0244 .  irbesartan (AVAPRO) tablet 300 mg, 300 mg, Oral, Daily, Amauri Keefe A, MD .  labetalol (NORMODYNE) injection 10-40 mg, 10-40 mg, Intravenous, Q10 min PRN, Shauntea Lok, Joyice Faster, MD .  lactated ringers infusion, , Intravenous, Continuous, Doriann Zuch, Joyice Faster, MD, Stopped at 04/09/19 1652 .  letrozole [facility]) tablet 2.5 mg, 2.5 mg, Oral, Daily, Taiga Lupinacci A, MD .  levETIRAcetam (KEPPRA) IVPB 500 mg/100 mL premix, 500 mg, Intravenous, Q12H, Johan Creveling, Joyice Faster, MD, Stopped at 04/10/19 0532 .  mupirocin ointment (BACTROBAN) 2 % 1 application, 1 application, Nasal, BID, Alayssa Flinchum A, MD .  nitroGLYCERIN (NITROSTAT) SL tablet 0.4 mg, 0.4 mg, Sublingual, Q5 min PRN, Alias Villagran A, MD .  ondansetron (ZOFRAN) tablet 4 mg, 4 mg, Oral, Q4H PRN **OR** ondansetron (ZOFRAN) injection 4 mg, 4 mg, Intravenous, Q4H PRN, Judith Part, MD .  polyethylene glycol (MIRALAX / GLYCOLAX) packet 17 g, 17 g, Oral, Daily PRN, Minha Fulco A, MD .  promethazine (PHENERGAN) tablet 12.5-25 mg, 12.5-25 mg, Oral, Q4H PRN, Judith Part, MD .  simvastatin (ZOCOR) tablet 10 mg, 10 mg, Oral, QHS, Armentha Branagan, Joyice Faster, MD .  vitamin B-12 (CYANOCOBALAMIN) tablet 100 mcg, 100 mcg, Oral, Daily, Rosalie Gelpi, Joyice Faster, MD   Physical Exam: AOx1, PERRL, eyes open spont, FCx4 without clear preference, drains w/ serosang output, incisions c/d/i  Assessment & Plan: 85 y.o. woman p/w seizures, CTH w/ b/l SDH, 12/13 s/p bilateral burr hole  drainage of SDHs, recovering well. Post-op CTH w/ post-op changes, good evacuation on R, drains in place in SD space bilaterally.  -drains d/c'd this morning -activity as tolerated -PT/OT -transfer to stepdown -bedside swallow eval -SCDs/TEDs, hold SQH until POD3  Judith Part  04/10/19 10:58 AM

## 2019-04-11 ENCOUNTER — Inpatient Hospital Stay (HOSPITAL_COMMUNITY): Payer: Medicare Other

## 2019-04-11 MED ORDER — HEPARIN SODIUM (PORCINE) 5000 UNIT/ML IJ SOLN
5000.0000 [IU] | Freq: Three times a day (TID) | INTRAMUSCULAR | Status: DC
  Administered 2019-04-11 – 2019-04-15 (×10): 5000 [IU] via SUBCUTANEOUS
  Filled 2019-04-11 (×10): qty 1

## 2019-04-11 MED ORDER — LEVETIRACETAM 500 MG PO TABS
500.0000 mg | ORAL_TABLET | Freq: Two times a day (BID) | ORAL | Status: DC
  Administered 2019-04-11 – 2019-04-15 (×8): 500 mg via ORAL
  Filled 2019-04-11 (×8): qty 1

## 2019-04-11 MED ORDER — FAMOTIDINE 20 MG PO TABS
20.0000 mg | ORAL_TABLET | Freq: Two times a day (BID) | ORAL | Status: DC
  Administered 2019-04-11 – 2019-04-15 (×8): 20 mg via ORAL
  Filled 2019-04-11 (×8): qty 1

## 2019-04-11 NOTE — Progress Notes (Signed)
  Speech Language Pathology Treatment: Dysphagia  Patient Details Name: Olivia Gray MRN: [id number] DOB: 1933/02/02 Today's Date: 04/11/2019 Time: [id number] SLP Time Calculation (min) (ACUTE ONLY): 24 min  Assessment / Plan / Recommendation Clinical Impression  Pt was encountered awake/alert with husband present at bedside.  Pt and husband both reported that the pt had been tolerating her current diet of Dysphagia 3 (soft) solids and thin liquids without difficulty.  Pt with moderate amount of secretions observed in her oral cavity upon SLP arrival and she was unable to swallow them despite cues.  SLP subsequently used suction to remove secretions.  Following this, pt consumed thin liquid via straw sip and regular solids without clinical s/sx of aspiration or difficulty.  Pt exhibited mildly prolonged mastication of regular solids secondary to edentulism (dentures at home); however, mastication was effective and pt was able to independently clear trace oral residue on her posterior lingual surface with a liquid wash.  Recommend continuation of Dysphagia 3 (soft) solids and thin liquid with the following compensatory strategies: 1) Small bites/sips 2) Slow rate of intake 3) Sit upright 90 degrees.  Pt will benefit from assistance with self-feeding secondary to vision deficits.  Husband requested that pt's diet be changed to automatic meal trays secondary to pt's decreased ability to order meal trays for herself.  Husband additionally reported that he has observed acute cognitive changes since admission and he requested a cognitive-linguistic evaluation.  SLP will be happy to complete evaluation if MD would like to order.      HPI HPI: Pt is an 85 y.o. woman p/w seizures, CTH w/ b/l SDH, 12/13 s/p bilateral burr hole drainage. Pt was intubated for procedure only. PMH: CABG x4, legally blind, HLD, breast ca s/p surgery and XRT, CAD, HTN      SLP Plan  Continue with current plan of care        Recommendations  Diet recommendations: Dysphagia 3 (mechanical soft);Thin liquid Liquids provided via: Cup;Straw Medication Administration: Whole meds with puree Supervision: Staff to assist with self feeding Compensations: Minimize environmental distractions;Slow rate;Small sips/bites Postural Changes and/or Swallow Maneuvers: Seated upright 90 degrees                Oral Care Recommendations: Oral care BID Follow up Recommendations: (To be determined ) SLP Visit Diagnosis: Dysphagia, oral phase (R13.11) Plan: Continue with current plan of care       [place]., M.S., [place] Acute Rehabilitation Services Office: [phone]  [place] 04/11/2019, 12:24 PM

## 2019-04-11 NOTE — Progress Notes (Signed)
vLTM started  Neurology notified  RN instructed on use of event button 

## 2019-04-11 NOTE — Progress Notes (Signed)
Neurosurgery Service Progress Note  Subjective: No acute events overnight, mental status continues to improve  Objective: Vitals:   04/10/19 2011 04/11/19 0050 04/11/19 0055 04/11/19 0759  BP: (!) 155/57   (!) 140/54  Pulse:    67  Resp:    18  Temp: 99.9 F (37.7 C) (!) 100.7 F (38.2 C) 100.1 F (37.8 C) 99.2 F (37.3 C)  TempSrc: Oral Oral Oral Oral  SpO2:    99%   Temp (24hrs), Avg:99.5 F (37.5 C), Min:98.7 F (37.1 C), Max:100.7 F (38.2 C)  CBC Latest Ref Rng & Units 04/08/2019 12/27/2018 08/09/2017  WBC 4.0 - 10.5 K/uL 6.4 3.7(L) 3.6(L)  Hemoglobin 12.0 - 15.0 g/dL 12.1 12.3 12.0  Hematocrit 36.0 - 46.0 % 38.2 37.4 35.6  Platelets 150 - 400 K/uL 200 176 189   BMP Latest Ref Rng & Units 04/08/2019 12/27/2018 08/09/2017  Glucose 70 - 99 mg/dL 157(H) 97 90  BUN 8 - 23 mg/dL 26(H) 15 11  Creatinine 0.44 - 1.00 mg/dL 1.28(H) 1.06(H) 0.93  Sodium 135 - 145 mmol/L 141 142 141  Potassium 3.5 - 5.1 mmol/L 4.4 4.4 4.1  Chloride 98 - 111 mmol/L 110 109 111(H)  CO2 22 - 32 mmol/L 17(L) 23 24  Calcium 8.9 - 10.3 mg/dL 9.5 9.4 9.6    Intake/Output Summary (Last 24 hours) at 04/11/2019 1054 Last data filed at 04/10/2019 1700 Gross per 24 hour  Intake 494.89 ml  Output 250 ml  Net 244.89 ml    Current Facility-Administered Medications:  .  0.9 %  sodium chloride infusion, , Intravenous, PRN, Judith Part, MD, Last Rate: 10 mL/hr at 04/10/19 1400, Rate Verify at 04/10/19 1400 .  acetaminophen (TYLENOL) tablet 650 mg, 650 mg, Oral, Q4H PRN **OR** acetaminophen (TYLENOL) suppository 650 mg, 650 mg, Rectal, Q4H PRN, Axxel Gude A, MD .  amLODipine (NORVASC) tablet 10 mg, 10 mg, Oral, Daily, Joanathan Affeldt, Joyice Faster, MD, 10 mg at 04/11/19 0935 .  carvedilol (COREG) tablet 3.125 mg, 3.125 mg, Oral, BID WC, Braeden Kennan, Joyice Faster, MD, 3.125 mg at 04/11/19 0935 .  Chlorhexidine Gluconate Cloth 2 % PADS 6 each, 6 each, Topical, Daily, Judith Part, MD, 6 each at 04/10/19  1014 .  docusate sodium (COLACE) capsule 100 mg, 100 mg, Oral, BID, Judith Part, MD, 100 mg at 04/11/19 0935 .  escitalopram (LEXAPRO) tablet 10 mg, 10 mg, Oral, Daily, Shaquoya Cosper A, MD, 10 mg at 04/11/19 0935 .  famotidine (PEPCID) tablet 20 mg, 20 mg, Oral, BID, Rumbarger, Valeda Malm, RPH .  HYDROcodone-acetaminophen (NORCO/VICODIN) 5-325 MG per tablet 1 tablet, 1 tablet, Oral, Q4H PRN, Judith Part, MD .  HYDROmorphone (DILAUDID) injection 0.5 mg, 0.5 mg, Intravenous, Q3H PRN, Judith Part, MD, 0.5 mg at 04/10/19 0244 .  irbesartan (AVAPRO) tablet 300 mg, 300 mg, Oral, Daily, Judith Part, MD, 300 mg at 04/11/19 0935 .  labetalol (NORMODYNE) injection 10-40 mg, 10-40 mg, Intravenous, Q10 min PRN, Javaris Wigington, Joyice Faster, MD .  lactated ringers infusion, , Intravenous, Continuous, Elenore Wanninger, Joyice Faster, MD, Stopped at 04/09/19 1652 .  letrozole [facility]) tablet 2.5 mg, 2.5 mg, Oral, Daily, Judith Part, MD, 2.5 mg at 04/11/19 0935 .  levETIRAcetam (KEPPRA) tablet 500 mg, 500 mg, Oral, BID, Rumbarger, Valeda Malm, RPH .  mupirocin ointment (BACTROBAN) 2 % 1 application, 1 application, Nasal, BID, Quantez Schnyder, Joyice Faster, MD, 1 application at [id number] .  nitroGLYCERIN (NITROSTAT) SL tablet 0.4 mg, 0.4 mg, Sublingual,  Q5 min PRN, Judith Part, MD .  ondansetron (ZOFRAN) tablet 4 mg, 4 mg, Oral, Q4H PRN **OR** ondansetron (ZOFRAN) injection 4 mg, 4 mg, Intravenous, Q4H PRN, Raseel Jans A, MD .  polyethylene glycol (MIRALAX / GLYCOLAX) packet 17 g, 17 g, Oral, Daily PRN, Judith Part, MD .  promethazine (PHENERGAN) tablet 12.5-25 mg, 12.5-25 mg, Oral, Q4H PRN, Judith Part, MD .  simvastatin (ZOCOR) tablet 10 mg, 10 mg, Oral, QHS, Edessa Jakubowicz, Joyice Faster, MD, 10 mg at 04/10/19 2220 .  vitamin B-12 (CYANOCOBALAMIN) tablet 100 mcg, 100 mcg, Oral, Daily, Judith Part, MD, 100 mcg at 04/11/19 0935   Physical Exam: AOx3, PERRL, eyes open  spont, FCx4 without clear preference,  Drain sites & incisions c/d/i  Assessment & Plan: 85 y.o. woman p/w seizures, CTH w/ b/l SDH, 12/13 s/p bilateral burr hole drainage of SDHs, recovering well. Post-op CTH w/ post-op changes, good evacuation on R, drains in place in SD space bilaterally. 12/14 b/l SD drains d/c'd & transferred to stepdown  -can be floor status -activity as tolerated -PT/OT, CIR transfer pending, medically ready from my perspective -SCDs/TEDs, start [place]  04/11/19 10:54 AM

## 2019-04-11 NOTE — Progress Notes (Signed)
Inpatient Rehab Admissions:  Inpatient Rehab Consult received.  I met with patient and her husband, Olivia Gray, at the bedside for rehabilitation assessment and to discuss goals and expectations of an inpatient rehab admission.  Both ask appropriate questions re rehab vs. Home with home health and are hopeful for CIR at this time.  I will open insurance for prior authorization for possible admission later this week pending approval, bed availability, and pt progress.   Signed: Shann Medal, PT, DPT Admissions Coordinator [phone] 04/11/19  11:12 AM

## 2019-04-12 DIAGNOSIS — R4182 Altered mental status, unspecified: Secondary | ICD-10-CM

## 2019-04-12 NOTE — Progress Notes (Signed)
Physical Therapy Treatment Patient Details Name: Olivia Gray MRN: [id number] DOB: 07-28-1932 Today's Date: 04/12/2019    History of Present Illness This 85 y.o. female admitted after being found doun by her spouse, obtunded. CT of head showed Rt SDH, and small Lt SDH.  She underwent bil. burr hole evacuations.     PMH includes:  s/p CABG, Retinitis pigmentosa bil. eyes with legal blindness, MVP, HTN, CAD, h/o breast CA     PT Comments    Pt needing minimal assist given not in her home environment.  Appears to be more cognitively sound.  Emphasis on gait stability and stamina plus working on negotiating an unfamiliar though homelike environment.    Follow Up Recommendations  CIR;Supervision/Assistance - 24 hour     Equipment Recommendations  None recommended by PT    Recommendations for Other Services       Precautions / Restrictions Precautions Precautions: Fall Precaution Comments: impaired balance as well as visual deficits     Mobility  Bed Mobility                  Transfers Overall transfer level: Needs assistance Equipment used: None Transfers: Sit to/from Stand Sit to Stand: Min assist         General transfer comment: much less impulsive, waiting for assist.  Stability assist needed today  Ambulation/Gait Ambulation/Gait assistance: Min assist Gait Distance (Feet): 350 Feet Assistive device: 1 person hand held assist Gait Pattern/deviations: Step-through pattern Gait velocity: able to speed up to cuing, attaining age appropriate gait speeds.   General Gait Details: minor scissor or drift, but noticeably less deviation today.  Put pt's hand on the hall rail and asked her to negotiate the area with cue.  Pt much more guarded needing cues for negotiating obstacles.   Stairs             Wheelchair Mobility    Modified Rankin (Stroke Patients Only)       Balance Overall balance assessment: Needs assistance   Sitting balance-Leahy Scale:  Fair       Standing balance-Leahy Scale: Poor Standing balance comment: needs a stationary object to maintain stability                            Cognition   Behavior During Therapy: WFL for tasks assessed/performed Overall Cognitive Status: (pt stating her favorite channel and what was on at this time)                                        Exercises      General Comments        Pertinent Vitals/Pain Pain Assessment: Faces Faces Pain Scale: No hurt    Home Living                      Prior Function            PT Goals (current goals can now be found in the care plan section) Acute Rehab PT Goals Patient Stated Goal: pt and pt's husband want to get pt home. PT Goal Formulation: With patient/family Time For Goal Achievement: 04/24/19 Potential to Achieve Goals: Good Progress towards PT goals: Progressing toward goals    Frequency    Min 3X/week      PT Plan Current plan remains appropriate  Co-evaluation              AM-PAC PT "6 Clicks" Mobility   Outcome Measure  Help needed turning from your back to your side while in a flat bed without using bedrails?: A Little Help needed moving from lying on your back to sitting on the side of a flat bed without using bedrails?: A Little Help needed moving to and from a bed to a chair (including a wheelchair)?: A Little Help needed standing up from a chair using your arms (e.g., wheelchair or bedside chair)?: A Little Help needed to walk in hospital room?: A Little Help needed climbing 3-5 steps with a railing? : A Lot 6 Click Score: 17    End of Session   Activity Tolerance: Patient tolerated treatment well Patient left: in chair;with call bell/phone within reach;with chair alarm set;with family/visitor present Nurse Communication: Mobility status PT Visit Diagnosis: Unsteadiness on feet (R26.81);Other abnormalities of gait and mobility (R26.89)     Time:  [id number] PT Time Calculation (min) (ACUTE ONLY): 21 min  Charges:  $Gait Training: 8-22 mins                     04/12/2019  Ginger Carne., PT Acute Rehabilitation Services [phone]  (pager) [phone]  (office)   Olivia Gray 04/12/2019, 2:58 PM

## 2019-04-12 NOTE — Progress Notes (Signed)
LTM EEG complete.Slight skin breakdown at Electrode site FP1. No other breakdown seen.

## 2019-04-12 NOTE — Procedures (Addendum)
Patient Name: Olivia Gray  MRN: [id number]  Epilepsy Attending: Lora Havens  Referring Physician/Provider: Dr Missy Sabins Duration: 04/11/2019 1802 04/12/2019 1134  Patient history: 85yo F with BL subdural hematoma s/p burr hole, ams and seizure. EEG to evaluate for seizure  Level of alertness: awake, asleep  AEDs during EEG study: Keppra  Technical aspects: This EEG study was done with scalp electrodes positioned according to the 10-20 International system of electrode placement. Electrical activity was acquired at a sampling rate of 500Hz  and reviewed with a high frequency filter of 70Hz  and a low frequency filter of 1Hz . EEG data were recorded continuously and digitally stored.   DESCRIPTION: During awake state, no clear posterior dominant rhythm was seen.Sleep was characterized by sleep spindles (12-14Hz ), maximal frontocentral. EEG showed continuous generalized and lateralized right hemisphere 3-5Hz  theta-delta slowing. Intermittent rhythmic 2-3Hz  delta slowing was also seen in left hemisphere.   Sharp transients, maximal bifrontal were also noted.  Hyperventilation and photic stimulation were not performed.  ABNORMALITY - Continuous slow, generalized and lateralized right hemisphere - Intermittent rhythmic slow, left temporal  IMPRESSION: This study showed evidence of generalized cortical dysfunction( right>left) likely secondary to underlying SDH. Additionally there is evidence of moderate to severe diffuse encephalopathy. No seizures or definite epileptiform discharges were seen throughout the recording.  [name] [name] [name] [name]

## 2019-04-12 NOTE — Progress Notes (Signed)
Neurosurgery Service Progress Note  Subjective: No acute events overnight, pt's husband concerned about some confusion last night, appears improved this morning  Objective: Vitals:   04/12/19 0009 04/12/19 0401 04/12/19 0410 04/12/19 0800  BP:  136/64  (!) 137/48  Pulse:  69  (!) 57  Resp:    11  Temp: 99.9 F (37.7 C)  98.3 F (36.8 C) 98 F (36.7 C)  TempSrc: Oral  Oral Oral  SpO2:  100%  100%   Temp (24hrs), Avg:98.8 F (37.1 C), Min:97.4 F (36.3 C), Max:101.3 F (38.5 C)  CBC Latest Ref Rng & Units 04/08/2019 12/27/2018 08/09/2017  WBC 4.0 - 10.5 K/uL 6.4 3.7(L) 3.6(L)  Hemoglobin 12.0 - 15.0 g/dL 12.1 12.3 12.0  Hematocrit 36.0 - 46.0 % 38.2 37.4 35.6  Platelets 150 - 400 K/uL 200 176 189   BMP Latest Ref Rng & Units 04/08/2019 12/27/2018 08/09/2017  Glucose 70 - 99 mg/dL 157(H) 97 90  BUN 8 - 23 mg/dL 26(H) 15 11  Creatinine 0.44 - 1.00 mg/dL 1.28(H) 1.06(H) 0.93  Sodium 135 - 145 mmol/L 141 142 141  Potassium 3.5 - 5.1 mmol/L 4.4 4.4 4.1  Chloride 98 - 111 mmol/L 110 109 111(H)  CO2 22 - 32 mmol/L 17(L) 23 24  Calcium 8.9 - 10.3 mg/dL 9.5 9.4 9.6    Intake/Output Summary (Last 24 hours) at 04/12/2019 0943 Last data filed at 04/12/2019 0845 Gross per 24 hour  Intake 240 ml  Output --  Net 240 ml    Current Facility-Administered Medications:  .  0.9 %  sodium chloride infusion, , Intravenous, PRN, Judith Part, MD, Last Rate: 10 mL/hr at 04/10/19 1400, Rate Verify at 04/10/19 1400 .  acetaminophen (TYLENOL) tablet 650 mg, 650 mg, Oral, Q4H PRN, 650 mg at 04/11/19 2343 **OR** acetaminophen (TYLENOL) suppository 650 mg, 650 mg, Rectal, Q4H PRN, Judith Part, MD .  amLODipine (NORVASC) tablet 10 mg, 10 mg, Oral, Daily, Judith Part, MD, 10 mg at 04/12/19 0935 .  carvedilol (COREG) tablet 3.125 mg, 3.125 mg, Oral, BID WC, Shadi Larner, Joyice Faster, MD, 3.125 mg at 04/12/19 0935 .  Chlorhexidine Gluconate Cloth 2 % PADS 6 each, 6 each, Topical, Daily,  Judith Part, MD, 6 each at 04/10/19 1014 .  docusate sodium (COLACE) capsule 100 mg, 100 mg, Oral, BID, Judith Part, MD, 100 mg at 04/12/19 0935 .  escitalopram (LEXAPRO) tablet 10 mg, 10 mg, Oral, Daily, Chirstine Defrain A, MD, 10 mg at 04/12/19 0935 .  famotidine (PEPCID) tablet 20 mg, 20 mg, Oral, BID, Rumbarger, Valeda Malm, RPH, 20 mg at 04/12/19 0935 .  heparin injection 5,000 Units, 5,000 Units, Subcutaneous, Q8H, Judith Part, MD, 5,000 Units at 04/12/19 0612 .  HYDROcodone-acetaminophen (NORCO/VICODIN) 5-325 MG per tablet 1 tablet, 1 tablet, Oral, Q4H PRN, Judith Part, MD .  HYDROmorphone (DILAUDID) injection 0.5 mg, 0.5 mg, Intravenous, Q3H PRN, Judith Part, MD, 0.5 mg at 04/10/19 0244 .  irbesartan (AVAPRO) tablet 300 mg, 300 mg, Oral, Daily, Judith Part, MD, 300 mg at 04/12/19 0935 .  labetalol (NORMODYNE) injection 10-40 mg, 10-40 mg, Intravenous, Q10 min PRN, Enyla Lisbon, Joyice Faster, MD .  lactated ringers infusion, , Intravenous, Continuous, Olesya Wike, Joyice Faster, MD, Stopped at 04/09/19 1652 .  letrozole [facility]) tablet 2.5 mg, 2.5 mg, Oral, Daily, Judith Part, MD, 2.5 mg at 04/12/19 0935 .  levETIRAcetam (KEPPRA) tablet 500 mg, 500 mg, Oral, BID, Rumbarger, Rachel L, RPH, 500 mg  at 04/12/19 0935 .  mupirocin ointment (BACTROBAN) 2 % 1 application, 1 application, Nasal, BID, Verdean Murin, Joyice Faster, MD, 1 application at [phone] .  nitroGLYCERIN (NITROSTAT) SL tablet 0.4 mg, 0.4 mg, Sublingual, Q5 min PRN, Arcelia Pals A, MD .  ondansetron (ZOFRAN) tablet 4 mg, 4 mg, Oral, Q4H PRN **OR** ondansetron (ZOFRAN) injection 4 mg, 4 mg, Intravenous, Q4H PRN, Gracyn Allor A, MD .  polyethylene glycol (MIRALAX / GLYCOLAX) packet 17 g, 17 g, Oral, Daily PRN, Judith Part, MD .  promethazine (PHENERGAN) tablet 12.5-25 mg, 12.5-25 mg, Oral, Q4H PRN, Judith Part, MD .  simvastatin (ZOCOR) tablet 10 mg, 10 mg, Oral, QHS,  Toye Rouillard, Joyice Faster, MD, 10 mg at 04/11/19 2223 .  vitamin B-12 (CYANOCOBALAMIN) tablet 100 mcg, 100 mcg, Oral, Daily, Judith Part, MD, 100 mcg at 04/12/19 0935   Physical Exam: AOx3, PERRL, eyes open spont, FCx4 without clear preference,  Drain sites & incisions c/d/i  Assessment & Plan: 85 y.o. woman p/w seizures, CTH w/ b/l SDH, 12/13 s/p bilateral burr hole drainage of SDHs, recovering well. Post-op CTH w/ post-op changes, good evacuation on R, drains in place in SD space bilaterally. 12/14 b/l SD drains d/c'd & transferred to stepdown, 12/15 confusion, rpt CTH stable  -overnight EEG pending to make sure she is not having any further significant electrographic abnormalities that would explain some temporary worsening of symptoms -CTH stable, EEG pending. From my perspective, for 86yo after cranial surgery, she is doing excellent. She is Ox3, was ambulating to the toilet this morning when I rounded. Okay for discharge to CIR from my perspective once the EEG read is in. -activity as tolerated -PT/OT, okay for transfer to CIR if EEG is negative -SCDs/TEDs, SQH  Judith Part  04/12/19 9:43 AM

## 2019-04-13 NOTE — Progress Notes (Signed)
Physical Therapy Treatment Patient Details Name: Olivia Gray MRN: [id number] DOB: [birth year] Today's Date: 04/13/2019    History of Present Illness This 85 y.o. female admitted after being found doun by her spouse, obtunded. CT of head showed Rt SDH, and small Lt SDH.  She underwent bil. burr hole evacuations.     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  She is limited in this unfamiliar environment due to blindness.  Emphasis was on sit to stands and progressing gait stability with HHA and stationary environmental hand holds.    Follow Up Recommendations  CIR;Supervision/Assistance - 24 hour;Other (comment)(pt could also benefit from HHPT in her familiar environment)     Equipment Recommendations  None recommended by PT    Recommendations for Other Services       Precautions / Restrictions Precautions Precautions: Fall Precaution Comments: impaired balance as well as visual deficits     Mobility  Bed Mobility               General bed mobility comments: OOB on arrival  Transfers Overall transfer level: Needs assistance Equipment used: None Transfers: Sit to/from Stand Sit to Stand: Min assist         General transfer comment: guidance only, pt rises easily  Ambulation/Gait Ambulation/Gait assistance: Min assist;Min guard Gait Distance (Feet): 350 Feet Assistive device: 1 person hand held assist Gait Pattern/deviations: Step-through pattern Gait velocity: able to speed up to cuing, attaining age appropriate gait speeds. Gait velocity interpretation: 1.31 - 2.62 ft/sec, indicative of limited community ambulator General Gait Details: HHA for guidance only.  1-2 minor deviations only.  When pt is asked to walk using the rail, she becomes noticeably guarded due to not familiar with this environment.   Stairs              Wheelchair Mobility    Modified Rankin (Stroke Patients Only)       Balance Overall balance assessment: Needs assistance Sitting-balance support: No upper extremity supported Sitting balance-Leahy Scale: Fair Sitting balance - Comments: makes appropriate balance adjustments sitting edge of the chair.   Standing balance support: Single extremity supported;During functional activity Standing balance-Leahy Scale: Poor Standing balance comment: needs a stationary support or HHA for safety                             Cognition Arousal/Alertness: Awake/alert Behavior During Therapy: WFL for tasks assessed/performed Overall Cognitive Status: (NT formally)                                        Exercises      General Comments General comments (skin integrity, edema, etc.): pt's husband got to witness how well pt was doing in the hallway.  She is doing about as good in a unfamiliar homelike environment as we might expect.  At this time she needs guidance.  She would likely get the most benefit at this point working with therapy in her own familiar environment      Pertinent Vitals/Pain Pain Assessment: Faces Faces Pain Scale: No hurt    Home Living  Prior Function            PT Goals (current goals can now be found in the care plan section) Acute Rehab PT Goals Patient Stated Goal: pt and pt's husband want to get pt home. PT Goal Formulation: With patient/family Time For Goal Achievement: 04/24/19 Potential to Achieve Goals: Good Progress towards PT goals: Progressing toward goals    Frequency    Min 3X/week      PT Plan Current plan remains appropriate    Co-evaluation              AM-PAC PT "6 Clicks" Mobility   Outcome Measure  Help needed turning from your back to your side while in a flat bed without using bedrails?: A Little Help needed moving from lying on your back to sitting on the  side of a flat bed without using bedrails?: A Little Help needed moving to and from a bed to a chair (including a wheelchair)?: A Little Help needed standing up from a chair using your arms (e.g., wheelchair or bedside chair)?: A Little Help needed to walk in hospital room?: A Little Help needed climbing 3-5 steps with a railing? : A Lot 6 Click Score: 17    End of Session   Activity Tolerance: Patient tolerated treatment well Patient left: in bed;Other (comment)(sitting EOB waiting for OT) Nurse Communication: Mobility status PT Visit Diagnosis: Other abnormalities of gait and mobility (R26.89);Difficulty in walking, not elsewhere classified (R26.2);Other symptoms and signs involving the nervous system (R29.898)     Time: [id number] PT Time Calculation (min) (ACUTE ONLY): 24 min  Charges:  $Gait Training: 8-22 mins $Therapeutic Activity: 8-22 mins                     04/13/2019  Ginger Carne., PT Acute Rehabilitation Services [phone]  (pager) [phone]  (office)   Tessie Fass Lucyle Alumbaugh 04/13/2019, 6:13 PM

## 2019-04-13 NOTE — Progress Notes (Signed)
Occupational Therapy Treatment Patient Details Name: Olivia Gray MRN: [id number] DOB: 01-19-1933 Today's Date: 04/13/2019    History of present illness This 85 y.o. female admitted after being found doun by her spouse, obtunded. CT of head showed Rt SDH, and small Lt SDH.  She underwent bil. burr hole evacuations.     PMH includes:  s/p CABG, Retinitis pigmentosa bil. eyes with legal blindness, MVP, HTN, CAD, h/o breast CA    OT comments  Pt performing ADL functional mobility with minguardA to minA for hand held assist with verbal cueing due to low vision. Pt performing ADL tasks- LB dressing in sitting with minA overall mostly for guiding foot through hole and to steady pt as she stood up to pull pants to waist. Pt performing toilet hygiene with minA overall for stability. Pt stood at sink for washing hands with some leaning on sink for stability. Pt transferring to recliner and once finally settled, pt wanting to brush teeth; set-upA for task. Pt's spouse in room for part of session, but still feels CIR is the best option as family is in [place] until Saturday weather pending. Pt would greatly benefit from continued OT skilled services for ADL, mobility and safety. OT following. Pt progressing really well.     Follow Up Recommendations  CIR;Supervision/Assistance - 24 hour    Equipment Recommendations  None recommended by OT    Recommendations for Other Services      Precautions / Restrictions Precautions Precautions: Fall Precaution Comments: impaired balance as well as visual deficits  Restrictions Weight Bearing Restrictions: No       Mobility Bed Mobility               General bed mobility comments: performing with PT upon arrival  Transfers Overall transfer level: Needs assistance Equipment used: None Transfers: Sit to/from Stand Sit to Stand: Min guard;Min assist         General transfer comment: requires hand held assist    Balance Overall balance assessment:  Needs assistance   Sitting balance-Leahy Scale: Fair       Standing balance-Leahy Scale: Poor Standing balance comment: needs hand held assist                           ADL either performed or assessed with clinical judgement   ADL Overall ADL's : Needs assistance/impaired     Grooming: Wash/dry hands;Wash/dry face;Oral care;Min guard;Sitting;Standing Grooming Details (indicate cue type and reason): assist for balance and stability         Upper Body Dressing : Minimal assistance;Sitting   Lower Body Dressing: Minimal assistance;Sitting/lateral leans;Sit to/from stand;Cueing for safety;Cueing for sequencing Lower Body Dressing Details (indicate cue type and reason): Pt seated for donning pants and donning/doffing socks Toilet Transfer: Minimal assistance;Ambulation;Grab bars;Comfort height toilet   Toileting- Clothing Manipulation and Hygiene: Minimal assistance;Cueing for safety;Sitting/lateral lean;Sit to/from stand Toileting - Clothing Manipulation Details (indicate cue type and reason): assist for balance and tactile cues     Functional mobility during ADLs: Minimal assistance;Min guard General ADL Comments: Pt progressing well with strength, ability to care for self, and following commands.     Vision   Vision Assessment?: No apparent visual deficits   Perception     Praxis      Cognition Arousal/Alertness: Awake/alert Behavior During Therapy: WFL for tasks assessed/performed Overall Cognitive Status: Impaired/Different from baseline Area of Impairment: Attention;Following commands  Current Attention Level: Sustained   Following Commands: Follows multi-step commands with increased time       General Comments: Pt able to follow all commands. Pt unaware of date, but with clues able to state that it is December.        Exercises     Shoulder Instructions       General Comments      Pertinent Vitals/ Pain        Pain Assessment: Faces Faces Pain Scale: No hurt  Home Living                                          Prior Functioning/Environment              Frequency  Min 2X/week        Progress Toward Goals  OT Goals(current goals can now be found in the care plan section)  Progress towards OT goals: Progressing toward goals  Acute Rehab OT Goals Patient Stated Goal: pt and pt's husband want to get pt home. OT Goal Formulation: With patient/family Time For Goal Achievement: 04/24/19 Potential to Achieve Goals: Good ADL Goals Pt Will Perform Grooming: with supervision;standing Pt Will Perform Upper Body Bathing: with set-up;sitting Pt Will Perform Lower Body Bathing: with supervision;sit to/from stand Pt Will Perform Upper Body Dressing: with set-up;sitting Pt Will Perform Lower Body Dressing: with supervision;sit to/from stand Pt Will Transfer to Toilet: with supervision;ambulating;regular height toilet;grab bars Pt Will Perform Toileting - Clothing Manipulation and hygiene: with supervision;sit to/from stand  Plan Discharge plan remains appropriate    Co-evaluation                 AM-PAC OT "6 Clicks" Daily Activity     Outcome Measure   Help from another person eating meals?: A Little Help from another person taking care of personal grooming?: A Little Help from another person toileting, which includes using toliet, bedpan, or urinal?: A Little Help from another person bathing (including washing, rinsing, drying)?: A Little Help from another person to put on and taking off regular upper body clothing?: A Little Help from another person to put on and taking off regular lower body clothing?: A Little 6 Click Score: 18    End of Session Equipment Utilized During Treatment: Gait belt  OT Visit Diagnosis: Unsteadiness on feet (R26.81);Muscle weakness (generalized) (M62.81);Cognitive communication deficit (R41.841);Low vision, both eyes  (H54.2) Symptoms and signs involving cognitive functions: Nontraumatic SAH   Activity Tolerance Patient tolerated treatment well   Patient Left in chair;with call bell/phone within reach;with chair alarm set;with restraints reapplied(belt for chair alarm)   Nurse Communication Mobility status        Time: [id number] OT Time Calculation (min): 45 min  Charges: OT General Charges $OT Visit: 1 Visit OT Treatments $Self Care/Home Management : 38-52 mins  Ebony Hail Harold Hedge) Marsa Aris OTR/L Acute Rehabilitation Services Pager: [phone] Office: [phone]    Jenene Slicker Arvo Ealy 04/13/2019, 1:11 PM

## 2019-04-13 NOTE — TOC Initial Note (Signed)
Transition of Care [facility]) - Initial/Assessment Note    Patient Details  Name: Olivia Gray MRN: [id number] Date of Birth: 1932-12-02  [facility] of [facility] CM/SW Contact:    Vinie Sill, [place] Phone Number: 04/13/2019, 5:51 PM  Clinical Narrative:                  CSW met with the patient and her spouse,[name]. CSW introduced self and explained role. CSW discussed patient's progress and her appropriateness for CIR as explained by CIR/[facility]. Patient's spouse was not pleased with the decision process and declined SNF and HH at this time. Patient states he understands the choices but his wife should receive the best option/care  for her and that is "rehab, here at the hospital". Patient states he feels like they are being "push out on the streets". Patient expressed frustration about the admitting process. Patient states this process is not fair and he needs to speak to "someone in supervision". Patient's spouse is adamant that he should receive the services that was "promised" to him.   CSW will discuss patient's concerns with the [facility] team and continue to follow and assist with discharge planning.  Thurmond Butts, MSW, LCSWA Clinical Social Worker [phone]     Barriers to Discharge: (patient' spouse wants CIR and is refusing [facility] SNF)   Patient Goals and CMS Choice        Expected Discharge Plan and Services         Living arrangements for the past 2 months: Single Family Home                                      Prior Living Arrangements/Services Living arrangements for the past 2 months: Single Family Home Lives with:: Self, Spouse Patient language and need for interpreter reviewed:: No        Need for Family Participation in Patient Care: Yes (Comment) Care giver support system in place?: Yes (comment)   Criminal Activity/Legal Involvement Pertinent to Current Situation/Hospitalization: No - Comment as needed  Activities of Daily Living Home  Assistive Devices/Equipment: None    Permission Sought/Granted Permission sought to share information with : Family Supports Permission granted to share information with : Yes, Verbal Permission Granted  Share Information with NAME: Taylynn Easton     Permission granted to share info w Relationship: spouse  Permission granted to share info w Contact Information: [phone]  Emotional Assessment Appearance:: Appears stated age   Affect (typically observed): Appropriate, Calm Orientation: : Oriented to Self, Oriented to Place, Oriented to  Time Alcohol / Substance Use: Illicit Drugs Psych Involvement: No (comment)  Admission diagnosis:  Seizure ([place]) [R56.9] Subdural hematoma ([place]) [S06.5X9A] Bilateral subdural hematomas ([facility]) [S06.5X9A] Closed fracture of one rib of right side, initial encounter [S22.31XA] Altered mental status, unspecified altered mental status type [R41.82] Patient Active Problem List   Diagnosis Date Noted  . Subdural hematoma ([place]) 04/09/2019  . Separation of left acromioclavicular joint, type 5 07/13/2016  . Osteoporosis 05/07/2016  . Breast cancer of upper-outer quadrant of left female breast ([place]) 12/27/2015  . Musculoskeletal chest pain 09/10/2014  . S/P CABG x 4   . Essential hypertension     Class: Chronic  . Hyperlipidemia LDL goal <70   . Mitral valve anterior leaflet prolapse   . Coronary artery disease involving native heart without angina pectoris 05/14/2002   PCP:  Jani Gravel, MD Pharmacy:  [id number] [address] [place] [id number]: [phone]: [phone] [id number] [place] [address] [place]: [phone]: [phone]    Social Determinants of Health (SDOH) Interventions    Readmission Risk Interventions No flowsheet  data found.

## 2019-04-13 NOTE — Progress Notes (Signed)
Neurosurgery Service Progress Note  Subjective: No acute events overnight, no new concerns  Objective: Vitals:   04/13/19 0232 04/13/19 0313 04/13/19 0346 04/13/19 0809  BP:    124/62  Pulse: 69 67  84  Resp: 20 17  15   Temp:   98.9 F (37.2 C) 99 F (37.2 C)  TempSrc:   Oral Oral  SpO2: 99% 99%  98%   Temp (24hrs), Avg:98.6 F (37 C), Min:98.2 F (36.8 C), Max:99 F (37.2 C)  CBC Latest Ref Rng & Units 04/08/2019 12/27/2018 08/09/2017  WBC 4.0 - 10.5 K/uL 6.4 3.7(L) 3.6(L)  Hemoglobin 12.0 - 15.0 g/dL 12.1 12.3 12.0  Hematocrit 36.0 - 46.0 % 38.2 37.4 35.6  Platelets 150 - 400 K/uL 200 176 189   BMP Latest Ref Rng & Units 04/08/2019 12/27/2018 08/09/2017  Glucose 70 - 99 mg/dL 157(H) 97 90  BUN 8 - 23 mg/dL 26(H) 15 11  Creatinine 0.44 - 1.00 mg/dL 1.28(H) 1.06(H) 0.93  Sodium 135 - 145 mmol/L 141 142 141  Potassium 3.5 - 5.1 mmol/L 4.4 4.4 4.1  Chloride 98 - 111 mmol/L 110 109 111(H)  CO2 22 - 32 mmol/L 17(L) 23 24  Calcium 8.9 - 10.3 mg/dL 9.5 9.4 9.6    Intake/Output Summary (Last 24 hours) at 04/13/2019 0839 Last data filed at 04/12/2019 0845 Gross per 24 hour  Intake 240 ml  Output --  Net 240 ml    Current Facility-Administered Medications:  .  0.9 %  sodium chloride infusion, , Intravenous, PRN, Judith Part, MD, Last Rate: 10 mL/hr at 04/10/19 1400, Rate Verify at 04/10/19 1400 .  acetaminophen (TYLENOL) tablet 650 mg, 650 mg, Oral, Q4H PRN, 650 mg at 04/11/19 2343 **OR** acetaminophen (TYLENOL) suppository 650 mg, 650 mg, Rectal, Q4H PRN, Judith Part, MD .  amLODipine (NORVASC) tablet 10 mg, 10 mg, Oral, Daily, Amora Sheehy, Joyice Faster, MD, 10 mg at 04/13/19 0827 .  carvedilol (COREG) tablet 3.125 mg, 3.125 mg, Oral, BID WC, Judith Part, MD, 3.125 mg at 04/13/19 0829 .  Chlorhexidine Gluconate Cloth 2 % PADS 6 each, 6 each, Topical, Daily, Judith Part, MD, 6 each at 04/10/19 1014 .  docusate sodium (COLACE) capsule 100 mg, 100 mg,  Oral, BID, Judith Part, MD, 100 mg at 04/12/19 0935 .  escitalopram (LEXAPRO) tablet 10 mg, 10 mg, Oral, Daily, Addi Pak A, MD, 10 mg at 04/13/19 0827 .  famotidine (PEPCID) tablet 20 mg, 20 mg, Oral, BID, Rumbarger, Valeda Malm, RPH, 20 mg at 04/13/19 0830 .  heparin injection 5,000 Units, 5,000 Units, Subcutaneous, Q8H, Judith Part, MD, 5,000 Units at 04/13/19 [phone] .  HYDROcodone-acetaminophen (NORCO/VICODIN) 5-325 MG per tablet 1 tablet, 1 tablet, Oral, Q4H PRN, Judith Part, MD .  HYDROmorphone (DILAUDID) injection 0.5 mg, 0.5 mg, Intravenous, Q3H PRN, Judith Part, MD, 0.5 mg at 04/10/19 0244 .  irbesartan (AVAPRO) tablet 300 mg, 300 mg, Oral, Daily, Kallen Mccrystal A, MD, 300 mg at 04/13/19 0830 .  labetalol (NORMODYNE) injection 10-40 mg, 10-40 mg, Intravenous, Q10 min PRN, Marua Qin, Joyice Faster, MD .  lactated ringers infusion, , Intravenous, Continuous, Shantasia Hunnell, Joyice Faster, MD, Stopped at 04/09/19 1652 .  letrozole [facility]) tablet 2.5 mg, 2.5 mg, Oral, Daily, Judith Part, MD, 2.5 mg at 04/13/19 0827 .  levETIRAcetam (KEPPRA) tablet 500 mg, 500 mg, Oral, BID, Rumbarger, Valeda Malm, RPH, 500 mg at 04/13/19 [id number] .  mupirocin ointment (BACTROBAN) 2 % 1 application, 1 application,  Nasal, BID, Judith Part, MD, 1 application at [id number] .  nitroGLYCERIN (NITROSTAT) SL tablet 0.4 mg, 0.4 mg, Sublingual, Q5 min PRN, Alazia Crocket A, MD .  ondansetron (ZOFRAN) tablet 4 mg, 4 mg, Oral, Q4H PRN **OR** ondansetron (ZOFRAN) injection 4 mg, 4 mg, Intravenous, Q4H PRN, Geonna Lockyer A, MD .  polyethylene glycol (MIRALAX / GLYCOLAX) packet 17 g, 17 g, Oral, Daily PRN, Judith Part, MD .  promethazine (PHENERGAN) tablet 12.5-25 mg, 12.5-25 mg, Oral, Q4H PRN, Judith Part, MD .  simvastatin (ZOCOR) tablet 10 mg, 10 mg, Oral, QHS, Judith Part, MD, 10 mg at 04/12/19 2145 .  vitamin B-12 (CYANOCOBALAMIN) tablet 100 mcg, 100 mcg,  Oral, Daily, Judith Part, MD, 100 mcg at 04/13/19 [id number]   Physical Exam: AOx3, PERRL, eyes open spont, FCx4 without clear preference Drain sites & incisions c/d/i  Assessment & Plan: 85 y.o. woman p/w seizures, CTH w/ b/l SDH, 12/13 s/p bilateral burr hole drainage of SDHs, recovering well. Post-op CTH w/ post-op changes, good evacuation on R, drains in place in SD space bilaterally. 12/14 b/l SD drains d/c'd & transferred to stepdown, 12/15 confusion, rpt CTH stable, 12/16 overnight EEG neg  -PT/OT, okay for transfer to CIR when logistically possible -SCDs/TEDs, SQH  Judith Part  04/13/19 8:39 AM

## 2019-04-13 NOTE — Progress Notes (Signed)
Inpatient Rehab Admissions Coordinator:   Met with pt and spouse this AM to let them know that there were no beds available for pt in CIR today, and that with the progress pt has made since 12/14, pt would be able to d/c home.  Pt's spouse unhappy with this news, and states that he would like for her to still do CIR and they would wait for a bed to become available.  I reiterated that, with pt's progress, she no longer required CIR.  After some discussion, I agreed to come back after PT/OT had a chance to work with pt, so that spouse could observe her progress.   I returned in the afternoon.  Per PT/OT pt was min guard level and only needing guiding assistance due to visual deficits.  OT stated that spouse left during OT session, and was unable to observe in it's entirety.  I attempted to explain to pt's husband that, with pt's visual deficits, her goals at CIR could not be higher than min assist, as she would be in an unfamiliar environment and always need someone to guide her on the unit.  Since pt is already mobilizing at min assist/min guard level, she will not be a candidate for our program.  He continues to state that he wants the best for her and that we should make room.  I reiterated many times that we would not be able to offer Ms. Pun a bed, but it appears he will not accept this.    Shann Medal, PT, DPT Admissions Coordinator [phone] 04/13/19  7:56 PM

## 2019-04-14 NOTE — Discharge Summary (Signed)
Discharge Summary  Date of Admission: 04/08/2019  Date of Discharge: 04/14/19  Attending Physician: Emelda Brothers, MD  Hospital Course: Patient was admitted following a seizure. A CT head showed bilateral subdural hematomas. She was taken to the OR for bilateral burr holes and drainage of her SDHs with drain placement. Post-op CTH showed good evacuation, post-op long term EEG showed no epileptiform activity. Her hospital course was uncomplicated and the patient was discharged home on 04/15/2019. She will follow up in clinic with me in 2 weeks.  Neurologic exam at discharge:  AOx3, blind, EOMI, FS, TM Strength 5/5 x4, SILTx4, no drift  Discharge diagnosis: bilateral subdural hematomas  Judith Part, MD 04/14/19 4:38 PM

## 2019-04-14 NOTE — Progress Notes (Signed)
  Speech Language Pathology Treatment: Dysphagia  Patient Details Name: Rosheena Ohora MRN: [id number] DOB: 09-Jun-1932 Today's Date: 04/14/2019 Time: [id number] SLP Time Calculation (min) (ACUTE ONLY): 19 min  Assessment / Plan / Recommendation Clinical Impression  Pt had a single cough throughout PO intake with no other concerns for decreased airway protection and no other indicators of intolerance per chart review and discussion with RN and pt/husband. She has mildly prolonged mastication but clears her oral cavity well with a liquid wash and Min cues. She and her husband report that at home, she can eat regular solids. Will advance diet to regular textures and thin liquids with brief SLP f/u acutely for tolerance. [name] shares that he does not think she has returned to her cognitive baseline; therefore, recommend [facility] SLP f/u to address within functional home setting if MD is in agreement.   HPI HPI: Pt is an 85 y.o. woman p/w seizures, CTH w/ b/l SDH, 12/13 s/p bilateral burr hole drainage. Pt was intubated for procedure only. PMH: CABG x4, legally blind, HLD, breast ca s/p surgery and XRT, CAD, HTN      SLP Plan  Continue with current plan of care       Recommendations  Diet recommendations: Regular;Thin liquid Liquids provided via: Cup;Straw Medication Administration: Whole meds with puree Supervision: Staff to assist with self feeding Compensations: Minimize environmental distractions;Slow rate;Small sips/bites Postural Changes and/or Swallow Maneuvers: Seated upright 90 degrees                Oral Care Recommendations: Oral care BID Follow up Recommendations: Home health SLP;24 hour supervision/assistance SLP Visit Diagnosis: Dysphagia, oral phase (R13.11) Plan: Continue with current plan of care       GO                Venita Sheffield Vinal Rosengrant 04/14/2019, 3:47 PM  Pollyann Glen, M.A. [facility] Acute Environmental education officer [phone] Office [phone]

## 2019-04-14 NOTE — TOC Progression Note (Signed)
Transition of Care [facility]) - Progression Note    Patient Details  Name: Olivia Gray MRN: [id number] Date of Birth: [birth year]  Transition of Care [facility]) CM/SW [place], [place] Phone Number: 04/14/2019, 3:19 PM  Clinical Narrative:     CSW visit with the patient and her spouse,[name]. Patient's spouse continued to express his displeasure about his wife not being admitted into CIR but was more willing to discuss HHPT. CSW gave the patient's spouse medicare.gov listing for [facility] choice with rating and reviews. Copy in shadow chart. CSW encourage that he selects at least  2 HH top choices. Patient's spouse was appreciative of CSW assistance. RNCM updated.   CSW will continue to follow and assist with discharge planning.   Thurmond Butts, MSW, LCSWA Clinical Social Worker [phone]      Barriers to Discharge: (patient' spouse wants CIR and is refusing [place])  Expected Discharge Plan and Services         Living arrangements for the past 2 months: Single Family Home                                       Social Determinants of Health (SDOH) Interventions    Readmission Risk Interventions No flowsheet data found.

## 2019-04-14 NOTE — Progress Notes (Addendum)
Neurosurgery Service Progress Note  Subjective: No acute events overnight  Objective: Vitals:   04/13/19 2000 04/14/19 0000 04/14/19 0400 04/14/19 0743  BP: (!) 135/55 (!) 123/56 (!) 135/55 138/62  Pulse: 69 64 66 60  Resp: 20 19 17  (!) 25  Temp: 99.2 F (37.3 C) 98.6 F (37 C) 98.6 F (37 C) 98.7 F (37.1 C)  TempSrc: Oral Oral Oral Oral  SpO2: 96% 100% 96% 98%   Temp (24hrs), Avg:98.6 F (37 C), Min:97.6 F (36.4 C), Max:99.2 F (37.3 C)  CBC Latest Ref Rng & Units 04/08/2019 12/27/2018 08/09/2017  WBC 4.0 - 10.5 K/uL 6.4 3.7(L) 3.6(L)  Hemoglobin 12.0 - 15.0 g/dL 12.1 12.3 12.0  Hematocrit 36.0 - 46.0 % 38.2 37.4 35.6  Platelets 150 - 400 K/uL 200 176 189   BMP Latest Ref Rng & Units 04/08/2019 12/27/2018 08/09/2017  Glucose 70 - 99 mg/dL 157(H) 97 90  BUN 8 - 23 mg/dL 26(H) 15 11  Creatinine 0.44 - 1.00 mg/dL 1.28(H) 1.06(H) 0.93  Sodium 135 - 145 mmol/L 141 142 141  Potassium 3.5 - 5.1 mmol/L 4.4 4.4 4.1  Chloride 98 - 111 mmol/L 110 109 111(H)  CO2 22 - 32 mmol/L 17(L) 23 24  Calcium 8.9 - 10.3 mg/dL 9.5 9.4 9.6    Intake/Output Summary (Last 24 hours) at 04/14/2019 [id number] Last data filed at 04/13/2019 1245 Gross per 24 hour  Intake 240 ml  Output -  Net 240 ml    Current Facility-Administered Medications:  .  0.9 %  sodium chloride infusion, , Intravenous, PRN, Judith Part, MD, Last Rate: 10 mL/hr at 04/10/19 1400, Rate Verify at 04/10/19 1400 .  acetaminophen (TYLENOL) tablet 650 mg, 650 mg, Oral, Q4H PRN, 650 mg at 04/11/19 2343 **OR** acetaminophen (TYLENOL) suppository 650 mg, 650 mg, Rectal, Q4H PRN, Judith Part, MD .  amLODipine (NORVASC) tablet 10 mg, 10 mg, Oral, Daily, Ramsey Guadamuz, Joyice Faster, MD, 10 mg at 04/13/19 0827 .  carvedilol (COREG) tablet 3.125 mg, 3.125 mg, Oral, BID WC, Casimiro Lienhard, Joyice Faster, MD, 3.125 mg at 04/13/19 1803 .  Chlorhexidine Gluconate Cloth 2 % PADS 6 each, 6 each, Topical, Daily, Judith Part, MD, 6 each at  04/10/19 1014 .  docusate sodium (COLACE) capsule 100 mg, 100 mg, Oral, BID, Judith Part, MD, 100 mg at 04/12/19 0935 .  escitalopram (LEXAPRO) tablet 10 mg, 10 mg, Oral, Daily, Herson Prichard A, MD, 10 mg at 04/13/19 0827 .  famotidine (PEPCID) tablet 20 mg, 20 mg, Oral, BID, Rumbarger, Valeda Malm, RPH, 20 mg at 04/13/19 2144 .  heparin injection 5,000 Units, 5,000 Units, Subcutaneous, Q8H, Judith Part, MD, 5,000 Units at 04/14/19 [id number] .  HYDROcodone-acetaminophen (NORCO/VICODIN) 5-325 MG per tablet 1 tablet, 1 tablet, Oral, Q4H PRN, Judith Part, MD .  HYDROmorphone (DILAUDID) injection 0.5 mg, 0.5 mg, Intravenous, Q3H PRN, Judith Part, MD, 0.5 mg at 04/10/19 0244 .  irbesartan (AVAPRO) tablet 300 mg, 300 mg, Oral, Daily, Shatori Bertucci A, MD, 300 mg at 04/13/19 0830 .  labetalol (NORMODYNE) injection 10-40 mg, 10-40 mg, Intravenous, Q10 min PRN, Shamila Lerch, Joyice Faster, MD .  lactated ringers infusion, , Intravenous, Continuous, Jalaila Caradonna, Joyice Faster, MD, Stopped at 04/09/19 1652 .  letrozole [facility]) tablet 2.5 mg, 2.5 mg, Oral, Daily, Judith Part, MD, 2.5 mg at 04/13/19 0827 .  levETIRAcetam (KEPPRA) tablet 500 mg, 500 mg, Oral, BID, Rumbarger, Valeda Malm, RPH, 500 mg at 04/13/19 2145 .  mupirocin ointment (  BACTROBAN) 2 % 1 application, 1 application, Nasal, BID, Judith Part, MD, 1 application at [id number] .  nitroGLYCERIN (NITROSTAT) SL tablet 0.4 mg, 0.4 mg, Sublingual, Q5 min PRN, Ismerai Bin A, MD .  ondansetron (ZOFRAN) tablet 4 mg, 4 mg, Oral, Q4H PRN **OR** ondansetron (ZOFRAN) injection 4 mg, 4 mg, Intravenous, Q4H PRN, Victoriya Pol A, MD .  polyethylene glycol (MIRALAX / GLYCOLAX) packet 17 g, 17 g, Oral, Daily PRN, Judith Part, MD .  promethazine (PHENERGAN) tablet 12.5-25 mg, 12.5-25 mg, Oral, Q4H PRN, Judith Part, MD .  simvastatin (ZOCOR) tablet 10 mg, 10 mg, Oral, QHS, Judith Part, MD, 10 mg at  04/13/19 2145 .  vitamin B-12 (CYANOCOBALAMIN) tablet 100 mcg, 100 mcg, Oral, Daily, Judith Part, MD, 100 mcg at 04/13/19 [id number]   Physical Exam: AOx3, blind, FCx4 Drain sites & incisions c/d/i  Assessment & Plan: 85 y.o. woman p/w seizures, CTH w/ b/l SDH, 12/13 s/p bilateral burr hole drainage of SDHs, recovering well. Post-op CTH w/ post-op changes, good evacuation on R, drains in place in SD space bilaterally. 12/14 b/l SD drains d/c'd & transferred to stepdown, 12/15 confusion, rpt CTH stable, 12/16 overnight EEG neg  -PT/OT CIR vs HHPT, denied for CIR, I discussed with the patient, will discuss with her husband as well -will see what their comfort level is with discharge, may need to stay another day or two to make sure they feel safe with her going home, which is reasonable given that she is blind, is 85 years old, and just had brain surgery -SCDs/TEDs, SQH  She is still ataxic and regaining strength, I think she needs to have PT today to help recover function and help teach safe ambulation, given that this all likely started with a fall down the stairs. I discussed with the family, will discharge her tomorrow.  Joyice Faster Jewett Mcgann  04/14/19 9:03 AM

## 2019-04-15 MED ORDER — LEVETIRACETAM 500 MG PO TABS: 500.0000 mg | ORAL_TABLET | Freq: Two times a day (BID) | ORAL | 0 refills | Status: DC

## 2019-04-15 NOTE — Discharge Summary (Signed)
Physician Discharge Summary  Patient ID: Olivia Gray MRN: [id number] DOB/AGE: [birth year] 85 y.o.  Admit date: 04/08/2019 Discharge date: 04/15/2019  Admission Diagnoses: Subdural Hematoma  Discharge Diagnoses:  Active Problems:   Subdural hematoma [facility])   Discharged Condition: good  Hospital Course: Patient was admitted following a seizure. A CT head showed bilateral subdural hematomas. She was taken to the OR for bilateral burr holes and drainage of her SDHs with drain placement. Post-op CTH showed good evacuation, post-op long term EEG showed no epileptiform activity. Her hospital course was uncomplicated and the patient was discharged home on 04/15/2019. She will follow up in clinic with Dr. Zada Finders in 2 weeks  Consults: rehabilitation medicine  Significant Diagnostic Studies: radiology: CT HEAD WO CONTRAST  Result Date: 04/11/2019 CLINICAL DATA:  Confusion, follow-up subdural hematoma; subdural hemorrhage. EXAM: CT HEAD WITHOUT CONTRAST TECHNIQUE: Contiguous axial images were obtained from the base of the skull through the vertex without intravenous contrast. COMPARISON:  Head CT 04/09/2019 FINDINGS: Brain: Again demonstrated are bilateral parietal burr holes. Interval removal of previously demonstrated bilateral subdural drains. There is persistent moderate pneumocephalus overlying the right frontal lobe with mass effect upon the underlying right cerebral hemisphere. Fluid components of a predominantly hypodense right-sided subdural collection more posteriorly have not significantly changed, again measuring up to 11 mm in thickness. Persistent small foci of acute hemorrhage within this collection. Unchanged size of a mixed density left holo hemispheric subdural hematoma, again measuring up to 14 mm in greatest thickness posteriorly (series 5, image 48) (remeasured on prior). Persistent mass effect upon the underlying left cerebral hemisphere. Partial effacement of the lateral ventricles  with unchanged leftward midline shift measuring 4 mm at the level of the septum pellucidum. No evidence of interval intracranial hemorrhage. Vascular: No hyperdense vessel.  Atherosclerotic calcifications. Skull: No calvarial fracture.  Bilateral burr holes as described. Sinuses/Orbits: Visualized orbits demonstrate no acute abnormality. No significant paranasal sinus disease or mastoid effusion at the imaged levels. IMPRESSION: Interval removal of previously demonstrated bilateral subdural drains. Unchanged moderate pneumocephalus overlying the right frontal lobe. Associated predominantly low-density right subdural collection containing some acute blood products more posteriorly. Fluid components of the subdural collection are unchanged, again measuring 11 mm in greatest thickness. Unchanged mass effect upon the right cerebral hemisphere with 4 mm leftward midline shift. Unchanged mixed density left holohemispheric subdural hematoma again 14 mm in greatest thickness, with mass effect upon the underlying left cerebral hemisphere. No significant interval intracranial hemorrhage. Electronically Signed   By: Kellie Simmering DO   On: 04/11/2019 18:00   Overnight EEG with video  Result Date: 04/12/2019 Lora Havens, MD     04/12/2019 12:46 PM Patient Name: Olivia Gray MRN: [id number] Epilepsy Attending: Lora Havens Referring Physician/Provider: Dr Missy Sabins Duration: 04/11/2019 1802 04/12/2019 1134  Patient history: 85yo F with BL subdural hematoma s/p burr hole, ams and seizure. EEG to evaluate for seizure  Level of alertness: awake, asleep  AEDs during EEG study: Keppra  Technical aspects: This EEG study was done with scalp electrodes positioned according to the 10-20 International system of electrode placement. Electrical activity was acquired at a sampling rate of 500Hz  and reviewed with a high frequency filter of 70Hz  and a low frequency filter of 1Hz . EEG data were recorded continuously and  digitally stored.  DESCRIPTION: During awake state, no clear posterior dominant rhythm was seen.Sleep was characterized by sleep spindles (12-14Hz ), maximal frontocentral. EEG showed continuous generalized and lateralized right hemisphere 3-5Hz  theta-delta slowing. Intermittent rhythmic  2-3Hz  delta slowing was also seen in left hemisphere.   Sharp transients, maximal bifrontal were also noted.  Hyperventilation and photic stimulation were not performed.  ABNORMALITY - Continuous slow, generalized and lateralized right hemisphere - Intermittent rhythmic slow, left temporal  IMPRESSION: This study showed evidence of generalized cortical dysfunction( right>left) likely secondary to underlying SDH. Additionally there is evidence of moderate to severe diffuse encephalopathy. No seizures or definite epileptiform discharges were seen throughout the recording.  Priyanka [name] [name]     Treatments: surgery: Bilateral burr holes for evacuation of bilateral subdural hematomas  Discharge Exam: Blood pressure (!) 109/54, pulse (!) 59, temperature 98.8 F (37.1 C), temperature source Oral, resp. rate 13, SpO2 97 %.   Alert and oriented x 4 Blind MAE, Strength and sensation intact Incisions are approximated; Staples have been removed   Disposition: Discharge disposition: 01-Home or Self Care        Allergies as of 04/15/2019   No Known Allergies     Medication List    TAKE these medications   amLODipine 10 MG tablet Commonly known as: NORVASC TAKE 1 TABLET (10 MG TOTAL) BY MOUTH DAILY. What changed:   how much to take  how to take this  when to take this  additional instructions   aspirin 81 MG tablet Take 81 mg by mouth daily.   carvedilol 3.125 MG tablet Commonly known as: COREG TAKE 1 TABLET (3.125 MG TOTAL) BY MOUTH 2 (TWO) TIMES DAILY. What changed:   how much to take  how to take this  when to take this  additional instructions   cholecalciferol 1000 units  tablet Commonly known as: VITAMIN D Take 1,000 Units by mouth daily.   vitamin B-12 100 MCG tablet Commonly known as: CYANOCOBALAMIN Take 100 mcg by mouth daily.   cyanocobalamin 1000 MCG tablet Take 100 mcg by mouth daily.   escitalopram 10 MG tablet Commonly known as: LEXAPRO Take 10 mg by mouth daily.   letrozole 2.5 MG tablet Commonly known as: FEMARA TAKE 1 TABLET BY MOUTH EVERY DAY   levETIRAcetam 500 MG tablet Commonly known as: KEPPRA Take 1 tablet (500 mg total) by mouth 2 (two) times daily.   nitroGLYCERIN 0.4 MG SL tablet Commonly known as: NITROSTAT Place 1 tablet (0.4 mg total) under the tongue every 5 (five) minutes as needed for chest pain.   simvastatin 20 MG tablet Commonly known as: ZOCOR Take 1 tablet (20 mg total) by mouth at bedtime.   simvastatin 10 MG tablet Commonly known as: ZOCOR Take 10 mg by mouth at bedtime.   valsartan 320 MG tablet Commonly known as: DIOVAN TAKE 1 TABLET DAILY. TO REPLACE IRBESARTAN. What changed:   how much to take  how to take this  when to take this  additional instructions      Follow-up Information    Ostergard, Joyice Faster, MD. Schedule an appointment as soon as possible for a visit in 2 week(s).   Specialty: Neurosurgery Contact information: [place] [address] [phone]           Signed: Patricia Nettle 04/15/2019, 12:06 PM

## 2019-04-15 NOTE — Discharge Instructions (Signed)
You may wash your hair in 5-7 days with light massage. Make sure no open areas are present before washing your hair.

## 2019-04-15 NOTE — Progress Notes (Signed)
Physical Therapy Treatment Patient Details Name: Olivia Gray MRN: [id number] DOB: 06/08/1932 Today's Date: 04/15/2019    History of Present Illness This 85 y.o. female admitted after being found doun by her spouse, obtunded. CT of head showed Rt SDH, and small Lt SDH.  She underwent bil. burr hole evacuations.     PMH includes:  s/p CABG, Retinitis pigmentosa bil. eyes with legal blindness, MVP, HTN, CAD, h/o breast CA     PT Comments    Pt progressing well towards physical therapy goals. Requiring handheld assist for navigating hallway distances, improved balance and activity tolerance noted. Negotiated 10 steps with railing and min guard assist to prepare for discharge home. Pt husband/daughter educated on guarding technique and verbalized understanding. Also provided education on fall safety and prevention, advised full supervision and assist with cooking, medication management and mobility. Updated d/c plan to HHPT.     Follow Up Recommendations  Home health PT;Supervision/Assistance - 24 hour     Equipment Recommendations  None recommended by PT    Recommendations for Other Services       Precautions / Restrictions Precautions Precautions: Fall Precaution Comments: visual impairments Restrictions Weight Bearing Restrictions: No    Mobility  Bed Mobility               General bed mobility comments: OOB in chair  Transfers Overall transfer level: Needs assistance Equipment used: None Transfers: Sit to/from Stand Sit to Stand: Min guard         General transfer comment: min guard for guidance  Ambulation/Gait Ambulation/Gait assistance: Min guard Gait Distance (Feet): 200 Feet Assistive device: 1 person hand held assist Gait Pattern/deviations: Step-through pattern;Decreased stride length     General Gait Details: HHA for guidance only, cues for environmental obstacles and providing direction   Stairs Stairs: Yes Stairs assistance: Min guard Stair  Management: One rail Right Number of Stairs: 10 General stair comments: Instructions for step by step, placing foot fully on step   Wheelchair Mobility    Modified Rankin (Stroke Patients Only)       Balance Overall balance assessment: Needs assistance Sitting-balance support: No upper extremity supported Sitting balance-Leahy Scale: Good     Standing balance support: Single extremity supported;During functional activity Standing balance-Leahy Scale: Poor Standing balance comment: needs a stationary support or HHA for safety                             Cognition Arousal/Alertness: Awake/alert Behavior During Therapy: WFL for tasks assessed/performed Overall Cognitive Status: Impaired/Different from baseline Area of Impairment: Memory                     Memory: Decreased short-term memory                Exercises      General Comments        Pertinent Vitals/Pain Pain Assessment: Faces Faces Pain Scale: No hurt    Home Living                      Prior Function            PT Goals (current goals can now be found in the care plan section) Acute Rehab PT Goals Patient Stated Goal: prevent falls at home Potential to Achieve Goals: Good Progress towards PT goals: Progressing toward goals    Frequency    Min 3X/week  PT Plan Current plan remains appropriate    Co-evaluation              AM-PAC PT "6 Clicks" Mobility   Outcome Measure  Help needed turning from your back to your side while in a flat bed without using bedrails?: None Help needed moving from lying on your back to sitting on the side of a flat bed without using bedrails?: A Little Help needed moving to and from a bed to a chair (including a wheelchair)?: A Little Help needed standing up from a chair using your arms (e.g., wheelchair or bedside chair)?: A Little Help needed to walk in hospital room?: A Little Help needed climbing 3-5 steps  with a railing? : A Little 6 Click Score: 19    End of Session Equipment Utilized During Treatment: Gait belt Activity Tolerance: Patient tolerated treatment well Patient left: in chair;with call bell/phone within reach;with family/visitor present Nurse Communication: Mobility status PT Visit Diagnosis: Other abnormalities of gait and mobility (R26.89);Difficulty in walking, not elsewhere classified (R26.2);Other symptoms and signs involving the nervous system (R29.898)     Time: [id number] PT Time Calculation (min) (ACUTE ONLY): 42 min  Charges:  $Gait Training: 8-22 mins $Therapeutic Activity: 8-22 mins $Self Care/Home Management: [place], PT, DPT Acute Rehabilitation Services Pager [phone] Office [phone]    Willy Eddy 04/15/2019, 3:02 PM

## 2019-04-15 NOTE — TOC Transition Note (Signed)
Transition of Care [facility]) - CM/SW Discharge Note   Patient Details  Name: Olivia Gray MRN: [id number] Date of Birth: Aug 27, 1932  Transition of [facility]) CM/SW Contact:  [name], RN 04/15/2019, 12:47 PM   Clinical Narrative:    Patient to d/c home today with husband.  Discussed home health options with husband. Husband has no preference.  [name] was not able to take referral.  [name] can take referral.    No DME needed.  Per husband's requests, arrangements made for patient to work with PT on steps.     Final next level of care: [name] Barriers to Discharge: No Barriers Identified   Patient Goals and CMS Choice Patient states their goals for this hospitalization and ongoing recovery are:: to get home CMS Medicare.gov Compare Post Acute Care list provided to:: Patient Represenative (must comment) Choice offered to / list presented to : Spouse   Discharge Plan and Services    HH Arranged: PT, OT, Speech Therapy [name] Agency: [name] Date [place]: 04/15/19 Time [place]: [id number] Representative spoke with at [place]: [name]

## 2019-04-17 DIAGNOSIS — I69298 Other sequelae of other nontraumatic intracranial hemorrhage: Secondary | ICD-10-CM | POA: Diagnosis not present

## 2019-04-17 DIAGNOSIS — I2510 Atherosclerotic heart disease of native coronary artery without angina pectoris: Secondary | ICD-10-CM | POA: Diagnosis not present

## 2019-04-17 DIAGNOSIS — H548 Legal blindness, as defined in USA: Secondary | ICD-10-CM | POA: Diagnosis not present

## 2019-04-17 DIAGNOSIS — H3552 Pigmentary retinal dystrophy: Secondary | ICD-10-CM | POA: Diagnosis not present

## 2019-04-17 DIAGNOSIS — E785 Hyperlipidemia, unspecified: Secondary | ICD-10-CM | POA: Diagnosis not present

## 2019-04-17 DIAGNOSIS — Z853 Personal history of malignant neoplasm of breast: Secondary | ICD-10-CM | POA: Diagnosis not present

## 2019-04-17 DIAGNOSIS — R7303 Prediabetes: Secondary | ICD-10-CM | POA: Diagnosis not present

## 2019-04-17 DIAGNOSIS — Z951 Presence of aortocoronary bypass graft: Secondary | ICD-10-CM | POA: Diagnosis not present

## 2019-04-17 DIAGNOSIS — Z7982 Long term (current) use of aspirin: Secondary | ICD-10-CM | POA: Diagnosis not present

## 2019-04-17 DIAGNOSIS — Z9181 History of falling: Secondary | ICD-10-CM | POA: Diagnosis not present

## 2019-04-17 DIAGNOSIS — I10 Essential (primary) hypertension: Secondary | ICD-10-CM | POA: Diagnosis not present

## 2019-04-17 DIAGNOSIS — Z48812 Encounter for surgical aftercare following surgery on the circulatory system: Secondary | ICD-10-CM | POA: Diagnosis not present

## 2019-04-19 ENCOUNTER — Encounter: Payer: Self-pay | Admitting: *Deleted

## 2019-04-19 ENCOUNTER — Other Ambulatory Visit: Payer: Self-pay | Admitting: *Deleted

## 2019-04-19 DIAGNOSIS — I10 Essential (primary) hypertension: Secondary | ICD-10-CM | POA: Diagnosis not present

## 2019-04-19 DIAGNOSIS — Z48812 Encounter for surgical aftercare following surgery on the circulatory system: Secondary | ICD-10-CM | POA: Diagnosis not present

## 2019-04-19 DIAGNOSIS — H3552 Pigmentary retinal dystrophy: Secondary | ICD-10-CM | POA: Diagnosis not present

## 2019-04-19 DIAGNOSIS — Z7982 Long term (current) use of aspirin: Secondary | ICD-10-CM | POA: Diagnosis not present

## 2019-04-19 DIAGNOSIS — I69298 Other sequelae of other nontraumatic intracranial hemorrhage: Secondary | ICD-10-CM | POA: Diagnosis not present

## 2019-04-19 DIAGNOSIS — Z9181 History of falling: Secondary | ICD-10-CM | POA: Diagnosis not present

## 2019-04-19 DIAGNOSIS — E785 Hyperlipidemia, unspecified: Secondary | ICD-10-CM | POA: Diagnosis not present

## 2019-04-19 DIAGNOSIS — Z951 Presence of aortocoronary bypass graft: Secondary | ICD-10-CM | POA: Diagnosis not present

## 2019-04-19 DIAGNOSIS — I2510 Atherosclerotic heart disease of native coronary artery without angina pectoris: Secondary | ICD-10-CM | POA: Diagnosis not present

## 2019-04-19 DIAGNOSIS — R7303 Prediabetes: Secondary | ICD-10-CM | POA: Diagnosis not present

## 2019-04-19 DIAGNOSIS — H548 Legal blindness, as defined in USA: Secondary | ICD-10-CM | POA: Diagnosis not present

## 2019-04-19 DIAGNOSIS — Z853 Personal history of malignant neoplasm of breast: Secondary | ICD-10-CM | POA: Diagnosis not present

## 2019-04-19 NOTE — Patient Outreach (Signed)
Referral received for red flag on [facility] call: Who to call if problems and follow up appts.  NP spoke to Pt today who states she is feeling well now without complications status post fall sustaining bilateral subdural hematomas which required neuro surgery to evacuate the areas.  She does not have an appt for follow up with the neurosurgeon or her primary care.   Called and made an appt with Dr. Maudie Mercury on Monday, Jan 4th at 12:30 pm and with Dr. Cephus Shelling on Thursday, Jan 7th at 2:45 pm.  Called pt to advise of appts, Talked with daughter and husband who both deny any further needs at this time except continued home health.  Provided my number and encouraged them to call me if any problems arise over the holidays.  NP sending successful outreach letter.  Eulah Pont. Myrtie Neither, MSN, [facility] Gerontological Nurse Practitioner [facility] Care Management [phone]

## 2019-04-26 DIAGNOSIS — Z951 Presence of aortocoronary bypass graft: Secondary | ICD-10-CM | POA: Diagnosis not present

## 2019-04-26 DIAGNOSIS — R7303 Prediabetes: Secondary | ICD-10-CM | POA: Diagnosis not present

## 2019-04-26 DIAGNOSIS — H3552 Pigmentary retinal dystrophy: Secondary | ICD-10-CM | POA: Diagnosis not present

## 2019-04-26 DIAGNOSIS — Z9181 History of falling: Secondary | ICD-10-CM | POA: Diagnosis not present

## 2019-04-26 DIAGNOSIS — Z48812 Encounter for surgical aftercare following surgery on the circulatory system: Secondary | ICD-10-CM | POA: Diagnosis not present

## 2019-04-26 DIAGNOSIS — I69298 Other sequelae of other nontraumatic intracranial hemorrhage: Secondary | ICD-10-CM | POA: Diagnosis not present

## 2019-04-26 DIAGNOSIS — Z853 Personal history of malignant neoplasm of breast: Secondary | ICD-10-CM | POA: Diagnosis not present

## 2019-04-26 DIAGNOSIS — E785 Hyperlipidemia, unspecified: Secondary | ICD-10-CM | POA: Diagnosis not present

## 2019-04-26 DIAGNOSIS — H548 Legal blindness, as defined in USA: Secondary | ICD-10-CM | POA: Diagnosis not present

## 2019-04-26 DIAGNOSIS — I2510 Atherosclerotic heart disease of native coronary artery without angina pectoris: Secondary | ICD-10-CM | POA: Diagnosis not present

## 2019-04-26 DIAGNOSIS — Z7982 Long term (current) use of aspirin: Secondary | ICD-10-CM | POA: Diagnosis not present

## 2019-04-26 DIAGNOSIS — I10 Essential (primary) hypertension: Secondary | ICD-10-CM | POA: Diagnosis not present

## 2019-05-01 DIAGNOSIS — Z853 Personal history of malignant neoplasm of breast: Secondary | ICD-10-CM | POA: Diagnosis not present

## 2019-05-01 DIAGNOSIS — Z7982 Long term (current) use of aspirin: Secondary | ICD-10-CM | POA: Diagnosis not present

## 2019-05-01 DIAGNOSIS — I69298 Other sequelae of other nontraumatic intracranial hemorrhage: Secondary | ICD-10-CM | POA: Diagnosis not present

## 2019-05-01 DIAGNOSIS — Z951 Presence of aortocoronary bypass graft: Secondary | ICD-10-CM | POA: Diagnosis not present

## 2019-05-01 DIAGNOSIS — E785 Hyperlipidemia, unspecified: Secondary | ICD-10-CM | POA: Diagnosis not present

## 2019-05-01 DIAGNOSIS — H3552 Pigmentary retinal dystrophy: Secondary | ICD-10-CM | POA: Diagnosis not present

## 2019-05-01 DIAGNOSIS — I2510 Atherosclerotic heart disease of native coronary artery without angina pectoris: Secondary | ICD-10-CM | POA: Diagnosis not present

## 2019-05-01 DIAGNOSIS — Z48812 Encounter for surgical aftercare following surgery on the circulatory system: Secondary | ICD-10-CM | POA: Diagnosis not present

## 2019-05-01 DIAGNOSIS — I10 Essential (primary) hypertension: Secondary | ICD-10-CM | POA: Diagnosis not present

## 2019-05-01 DIAGNOSIS — Z9181 History of falling: Secondary | ICD-10-CM | POA: Diagnosis not present

## 2019-05-01 DIAGNOSIS — H548 Legal blindness, as defined in USA: Secondary | ICD-10-CM | POA: Diagnosis not present

## 2019-05-01 DIAGNOSIS — I6200 Nontraumatic subdural hemorrhage, unspecified: Secondary | ICD-10-CM | POA: Diagnosis not present

## 2019-05-01 DIAGNOSIS — R7303 Prediabetes: Secondary | ICD-10-CM | POA: Diagnosis not present

## 2019-05-01 DIAGNOSIS — E039 Hypothyroidism, unspecified: Secondary | ICD-10-CM | POA: Diagnosis not present

## 2019-05-05 DIAGNOSIS — H548 Legal blindness, as defined in USA: Secondary | ICD-10-CM | POA: Diagnosis not present

## 2019-05-05 DIAGNOSIS — E785 Hyperlipidemia, unspecified: Secondary | ICD-10-CM | POA: Diagnosis not present

## 2019-05-05 DIAGNOSIS — R7303 Prediabetes: Secondary | ICD-10-CM | POA: Diagnosis not present

## 2019-05-05 DIAGNOSIS — I10 Essential (primary) hypertension: Secondary | ICD-10-CM | POA: Diagnosis not present

## 2019-05-05 DIAGNOSIS — Z9181 History of falling: Secondary | ICD-10-CM | POA: Diagnosis not present

## 2019-05-05 DIAGNOSIS — Z853 Personal history of malignant neoplasm of breast: Secondary | ICD-10-CM | POA: Diagnosis not present

## 2019-05-05 DIAGNOSIS — Z7982 Long term (current) use of aspirin: Secondary | ICD-10-CM | POA: Diagnosis not present

## 2019-05-05 DIAGNOSIS — I2510 Atherosclerotic heart disease of native coronary artery without angina pectoris: Secondary | ICD-10-CM | POA: Diagnosis not present

## 2019-05-05 DIAGNOSIS — Z951 Presence of aortocoronary bypass graft: Secondary | ICD-10-CM | POA: Diagnosis not present

## 2019-05-05 DIAGNOSIS — H3552 Pigmentary retinal dystrophy: Secondary | ICD-10-CM | POA: Diagnosis not present

## 2019-05-05 DIAGNOSIS — Z48812 Encounter for surgical aftercare following surgery on the circulatory system: Secondary | ICD-10-CM | POA: Diagnosis not present

## 2019-05-05 DIAGNOSIS — I69298 Other sequelae of other nontraumatic intracranial hemorrhage: Secondary | ICD-10-CM | POA: Diagnosis not present

## 2019-05-09 DIAGNOSIS — Z853 Personal history of malignant neoplasm of breast: Secondary | ICD-10-CM | POA: Diagnosis not present

## 2019-05-09 DIAGNOSIS — I10 Essential (primary) hypertension: Secondary | ICD-10-CM | POA: Diagnosis not present

## 2019-05-09 DIAGNOSIS — Z9181 History of falling: Secondary | ICD-10-CM | POA: Diagnosis not present

## 2019-05-09 DIAGNOSIS — I2510 Atherosclerotic heart disease of native coronary artery without angina pectoris: Secondary | ICD-10-CM | POA: Diagnosis not present

## 2019-05-09 DIAGNOSIS — Z951 Presence of aortocoronary bypass graft: Secondary | ICD-10-CM | POA: Diagnosis not present

## 2019-05-09 DIAGNOSIS — Z48812 Encounter for surgical aftercare following surgery on the circulatory system: Secondary | ICD-10-CM | POA: Diagnosis not present

## 2019-05-09 DIAGNOSIS — R7303 Prediabetes: Secondary | ICD-10-CM | POA: Diagnosis not present

## 2019-05-09 DIAGNOSIS — Z7982 Long term (current) use of aspirin: Secondary | ICD-10-CM | POA: Diagnosis not present

## 2019-05-09 DIAGNOSIS — I69298 Other sequelae of other nontraumatic intracranial hemorrhage: Secondary | ICD-10-CM | POA: Diagnosis not present

## 2019-05-09 DIAGNOSIS — H3552 Pigmentary retinal dystrophy: Secondary | ICD-10-CM | POA: Diagnosis not present

## 2019-05-09 DIAGNOSIS — E785 Hyperlipidemia, unspecified: Secondary | ICD-10-CM | POA: Diagnosis not present

## 2019-05-09 DIAGNOSIS — H548 Legal blindness, as defined in USA: Secondary | ICD-10-CM | POA: Diagnosis not present

## 2019-05-11 DIAGNOSIS — E785 Hyperlipidemia, unspecified: Secondary | ICD-10-CM | POA: Diagnosis not present

## 2019-05-11 DIAGNOSIS — R7303 Prediabetes: Secondary | ICD-10-CM | POA: Diagnosis not present

## 2019-05-11 DIAGNOSIS — Z951 Presence of aortocoronary bypass graft: Secondary | ICD-10-CM | POA: Diagnosis not present

## 2019-05-11 DIAGNOSIS — I2510 Atherosclerotic heart disease of native coronary artery without angina pectoris: Secondary | ICD-10-CM | POA: Diagnosis not present

## 2019-05-11 DIAGNOSIS — Z7982 Long term (current) use of aspirin: Secondary | ICD-10-CM | POA: Diagnosis not present

## 2019-05-11 DIAGNOSIS — Z9181 History of falling: Secondary | ICD-10-CM | POA: Diagnosis not present

## 2019-05-11 DIAGNOSIS — I69298 Other sequelae of other nontraumatic intracranial hemorrhage: Secondary | ICD-10-CM | POA: Diagnosis not present

## 2019-05-11 DIAGNOSIS — Z48812 Encounter for surgical aftercare following surgery on the circulatory system: Secondary | ICD-10-CM | POA: Diagnosis not present

## 2019-05-11 DIAGNOSIS — I10 Essential (primary) hypertension: Secondary | ICD-10-CM | POA: Diagnosis not present

## 2019-05-11 DIAGNOSIS — Z853 Personal history of malignant neoplasm of breast: Secondary | ICD-10-CM | POA: Diagnosis not present

## 2019-05-11 DIAGNOSIS — H548 Legal blindness, as defined in USA: Secondary | ICD-10-CM | POA: Diagnosis not present

## 2019-05-11 DIAGNOSIS — H3552 Pigmentary retinal dystrophy: Secondary | ICD-10-CM | POA: Diagnosis not present

## 2019-05-18 DIAGNOSIS — Z48812 Encounter for surgical aftercare following surgery on the circulatory system: Secondary | ICD-10-CM | POA: Diagnosis not present

## 2019-05-18 DIAGNOSIS — R7303 Prediabetes: Secondary | ICD-10-CM | POA: Diagnosis not present

## 2019-05-18 DIAGNOSIS — I10 Essential (primary) hypertension: Secondary | ICD-10-CM | POA: Diagnosis not present

## 2019-05-18 DIAGNOSIS — E785 Hyperlipidemia, unspecified: Secondary | ICD-10-CM | POA: Diagnosis not present

## 2019-05-18 DIAGNOSIS — Z853 Personal history of malignant neoplasm of breast: Secondary | ICD-10-CM | POA: Diagnosis not present

## 2019-05-18 DIAGNOSIS — Z951 Presence of aortocoronary bypass graft: Secondary | ICD-10-CM | POA: Diagnosis not present

## 2019-05-18 DIAGNOSIS — H548 Legal blindness, as defined in USA: Secondary | ICD-10-CM | POA: Diagnosis not present

## 2019-05-18 DIAGNOSIS — H3552 Pigmentary retinal dystrophy: Secondary | ICD-10-CM | POA: Diagnosis not present

## 2019-05-18 DIAGNOSIS — Z9181 History of falling: Secondary | ICD-10-CM | POA: Diagnosis not present

## 2019-05-18 DIAGNOSIS — I2510 Atherosclerotic heart disease of native coronary artery without angina pectoris: Secondary | ICD-10-CM | POA: Diagnosis not present

## 2019-05-18 DIAGNOSIS — I69298 Other sequelae of other nontraumatic intracranial hemorrhage: Secondary | ICD-10-CM | POA: Diagnosis not present

## 2019-05-18 DIAGNOSIS — Z7982 Long term (current) use of aspirin: Secondary | ICD-10-CM | POA: Diagnosis not present

## 2019-06-01 DIAGNOSIS — I69298 Other sequelae of other nontraumatic intracranial hemorrhage: Secondary | ICD-10-CM | POA: Diagnosis not present

## 2019-06-01 DIAGNOSIS — H548 Legal blindness, as defined in USA: Secondary | ICD-10-CM | POA: Diagnosis not present

## 2019-06-01 DIAGNOSIS — Z9181 History of falling: Secondary | ICD-10-CM | POA: Diagnosis not present

## 2019-06-01 DIAGNOSIS — E785 Hyperlipidemia, unspecified: Secondary | ICD-10-CM | POA: Diagnosis not present

## 2019-06-01 DIAGNOSIS — Z853 Personal history of malignant neoplasm of breast: Secondary | ICD-10-CM | POA: Diagnosis not present

## 2019-06-01 DIAGNOSIS — Z48812 Encounter for surgical aftercare following surgery on the circulatory system: Secondary | ICD-10-CM | POA: Diagnosis not present

## 2019-06-01 DIAGNOSIS — I2510 Atherosclerotic heart disease of native coronary artery without angina pectoris: Secondary | ICD-10-CM | POA: Diagnosis not present

## 2019-06-01 DIAGNOSIS — R7303 Prediabetes: Secondary | ICD-10-CM | POA: Diagnosis not present

## 2019-06-01 DIAGNOSIS — Z951 Presence of aortocoronary bypass graft: Secondary | ICD-10-CM | POA: Diagnosis not present

## 2019-06-01 DIAGNOSIS — Z7982 Long term (current) use of aspirin: Secondary | ICD-10-CM | POA: Diagnosis not present

## 2019-06-01 DIAGNOSIS — H3552 Pigmentary retinal dystrophy: Secondary | ICD-10-CM | POA: Diagnosis not present

## 2019-06-01 DIAGNOSIS — I10 Essential (primary) hypertension: Secondary | ICD-10-CM | POA: Diagnosis not present

## 2019-07-06 DIAGNOSIS — S4992XA Unspecified injury of left shoulder and upper arm, initial encounter: Secondary | ICD-10-CM | POA: Diagnosis not present

## 2019-07-06 DIAGNOSIS — M898X1 Other specified disorders of bone, shoulder: Secondary | ICD-10-CM | POA: Diagnosis not present

## 2019-07-06 DIAGNOSIS — W19XXXA Unspecified fall, initial encounter: Secondary | ICD-10-CM | POA: Diagnosis not present

## 2019-07-06 DIAGNOSIS — M25512 Pain in left shoulder: Secondary | ICD-10-CM | POA: Diagnosis not present

## 2019-07-06 DIAGNOSIS — S2242XA Multiple fractures of ribs, left side, initial encounter for closed fracture: Secondary | ICD-10-CM | POA: Diagnosis not present

## 2019-07-06 DIAGNOSIS — S299XXA Unspecified injury of thorax, initial encounter: Secondary | ICD-10-CM | POA: Diagnosis not present

## 2019-07-21 DIAGNOSIS — I10 Essential (primary) hypertension: Secondary | ICD-10-CM | POA: Diagnosis not present

## 2019-07-21 DIAGNOSIS — S065X9A Traumatic subdural hemorrhage with loss of consciousness of unspecified duration, initial encounter: Secondary | ICD-10-CM | POA: Diagnosis not present

## 2019-07-27 HISTORY — PX: TRANSTHORACIC ECHOCARDIOGRAM: SHX275

## 2019-08-08 DIAGNOSIS — I129 Hypertensive chronic kidney disease with stage 1 through stage 4 chronic kidney disease, or unspecified chronic kidney disease: Secondary | ICD-10-CM | POA: Diagnosis not present

## 2019-08-08 DIAGNOSIS — M810 Age-related osteoporosis without current pathological fracture: Secondary | ICD-10-CM | POA: Diagnosis not present

## 2019-08-08 DIAGNOSIS — E538 Deficiency of other specified B group vitamins: Secondary | ICD-10-CM | POA: Diagnosis not present

## 2019-08-08 DIAGNOSIS — E785 Hyperlipidemia, unspecified: Secondary | ICD-10-CM | POA: Diagnosis not present

## 2019-08-08 DIAGNOSIS — E039 Hypothyroidism, unspecified: Secondary | ICD-10-CM | POA: Diagnosis not present

## 2019-08-08 DIAGNOSIS — E559 Vitamin D deficiency, unspecified: Secondary | ICD-10-CM | POA: Diagnosis not present

## 2019-08-08 DIAGNOSIS — R7309 Other abnormal glucose: Secondary | ICD-10-CM | POA: Diagnosis not present

## 2019-08-09 DIAGNOSIS — J300 Vasomotor rhinitis: Secondary | ICD-10-CM | POA: Diagnosis not present

## 2019-08-09 DIAGNOSIS — J310 Chronic rhinitis: Secondary | ICD-10-CM | POA: Diagnosis not present

## 2019-08-15 DIAGNOSIS — E559 Vitamin D deficiency, unspecified: Secondary | ICD-10-CM | POA: Diagnosis not present

## 2019-08-15 DIAGNOSIS — E785 Hyperlipidemia, unspecified: Secondary | ICD-10-CM | POA: Diagnosis not present

## 2019-08-15 DIAGNOSIS — I129 Hypertensive chronic kidney disease with stage 1 through stage 4 chronic kidney disease, or unspecified chronic kidney disease: Secondary | ICD-10-CM | POA: Diagnosis not present

## 2019-08-15 DIAGNOSIS — R7309 Other abnormal glucose: Secondary | ICD-10-CM | POA: Diagnosis not present

## 2019-08-22 ENCOUNTER — Encounter (HOSPITAL_COMMUNITY): Payer: Self-pay

## 2019-08-22 ENCOUNTER — Emergency Department (HOSPITAL_COMMUNITY): Payer: Medicare Other

## 2019-08-22 ENCOUNTER — Inpatient Hospital Stay (HOSPITAL_COMMUNITY)
Admission: EM | Admit: 2019-08-22 | Discharge: 2019-08-26 | DRG: 083 | Disposition: A | Discharge status: Home Health | Payer: Medicare Other | Attending: Physician Assistant | Admitting: Physician Assistant

## 2019-08-22 ENCOUNTER — Other Ambulatory Visit: Payer: Self-pay

## 2019-08-22 DIAGNOSIS — Z8679 Personal history of other diseases of the circulatory system: Secondary | ICD-10-CM | POA: Diagnosis not present

## 2019-08-22 DIAGNOSIS — I499 Cardiac arrhythmia, unspecified: Secondary | ICD-10-CM | POA: Diagnosis not present

## 2019-08-22 DIAGNOSIS — C50919 Malignant neoplasm of unspecified site of unspecified female breast: Secondary | ICD-10-CM | POA: Diagnosis not present

## 2019-08-22 DIAGNOSIS — I951 Orthostatic hypotension: Secondary | ICD-10-CM | POA: Diagnosis not present

## 2019-08-22 DIAGNOSIS — Z951 Presence of aortocoronary bypass graft: Secondary | ICD-10-CM

## 2019-08-22 DIAGNOSIS — Z7982 Long term (current) use of aspirin: Secondary | ICD-10-CM | POA: Diagnosis not present

## 2019-08-22 DIAGNOSIS — Z20822 Contact with and (suspected) exposure to covid-19: Secondary | ICD-10-CM | POA: Diagnosis present

## 2019-08-22 DIAGNOSIS — Z923 Personal history of irradiation: Secondary | ICD-10-CM

## 2019-08-22 DIAGNOSIS — I6200 Nontraumatic subdural hemorrhage, unspecified: Secondary | ICD-10-CM | POA: Diagnosis not present

## 2019-08-22 DIAGNOSIS — I471 Supraventricular tachycardia: Secondary | ICD-10-CM | POA: Diagnosis not present

## 2019-08-22 DIAGNOSIS — S22009A Unspecified fracture of unspecified thoracic vertebra, initial encounter for closed fracture: Secondary | ICD-10-CM | POA: Diagnosis present

## 2019-08-22 DIAGNOSIS — Z79899 Other long term (current) drug therapy: Secondary | ICD-10-CM | POA: Diagnosis not present

## 2019-08-22 DIAGNOSIS — S3991XA Unspecified injury of abdomen, initial encounter: Secondary | ICD-10-CM | POA: Diagnosis not present

## 2019-08-22 DIAGNOSIS — S42012A Anterior displaced fracture of sternal end of left clavicle, initial encounter for closed fracture: Secondary | ICD-10-CM | POA: Diagnosis present

## 2019-08-22 DIAGNOSIS — Z803 Family history of malignant neoplasm of breast: Secondary | ICD-10-CM

## 2019-08-22 DIAGNOSIS — S52532A Colles' fracture of left radius, initial encounter for closed fracture: Secondary | ICD-10-CM | POA: Diagnosis present

## 2019-08-22 DIAGNOSIS — T1490XA Injury, unspecified, initial encounter: Secondary | ICD-10-CM | POA: Diagnosis present

## 2019-08-22 DIAGNOSIS — Z9221 Personal history of antineoplastic chemotherapy: Secondary | ICD-10-CM

## 2019-08-22 DIAGNOSIS — R52 Pain, unspecified: Secondary | ICD-10-CM | POA: Diagnosis not present

## 2019-08-22 DIAGNOSIS — S42009A Fracture of unspecified part of unspecified clavicle, initial encounter for closed fracture: Secondary | ICD-10-CM

## 2019-08-22 DIAGNOSIS — E785 Hyperlipidemia, unspecified: Secondary | ICD-10-CM | POA: Diagnosis present

## 2019-08-22 DIAGNOSIS — M542 Cervicalgia: Secondary | ICD-10-CM | POA: Diagnosis not present

## 2019-08-22 DIAGNOSIS — S2242XA Multiple fractures of ribs, left side, initial encounter for closed fracture: Secondary | ICD-10-CM | POA: Diagnosis present

## 2019-08-22 DIAGNOSIS — I2510 Atherosclerotic heart disease of native coronary artery without angina pectoris: Secondary | ICD-10-CM | POA: Diagnosis not present

## 2019-08-22 DIAGNOSIS — W108XXA Fall (on) (from) other stairs and steps, initial encounter: Secondary | ICD-10-CM | POA: Diagnosis present

## 2019-08-22 DIAGNOSIS — R404 Transient alteration of awareness: Secondary | ICD-10-CM | POA: Diagnosis not present

## 2019-08-22 DIAGNOSIS — S0993XA Unspecified injury of face, initial encounter: Secondary | ICD-10-CM | POA: Diagnosis not present

## 2019-08-22 DIAGNOSIS — Y92009 Unspecified place in unspecified non-institutional (private) residence as the place of occurrence of the external cause: Secondary | ICD-10-CM | POA: Diagnosis not present

## 2019-08-22 DIAGNOSIS — S065X9A Traumatic subdural hemorrhage with loss of consciousness of unspecified duration, initial encounter: Principal | ICD-10-CM | POA: Diagnosis present

## 2019-08-22 DIAGNOSIS — S299XXA Unspecified injury of thorax, initial encounter: Secondary | ICD-10-CM | POA: Diagnosis not present

## 2019-08-22 DIAGNOSIS — D649 Anemia, unspecified: Secondary | ICD-10-CM | POA: Diagnosis not present

## 2019-08-22 DIAGNOSIS — R001 Bradycardia, unspecified: Secondary | ICD-10-CM | POA: Diagnosis not present

## 2019-08-22 DIAGNOSIS — R9431 Abnormal electrocardiogram [ECG] [EKG]: Secondary | ICD-10-CM | POA: Diagnosis not present

## 2019-08-22 DIAGNOSIS — R55 Syncope and collapse: Secondary | ICD-10-CM | POA: Diagnosis not present

## 2019-08-22 DIAGNOSIS — Z03818 Encounter for observation for suspected exposure to other biological agents ruled out: Secondary | ICD-10-CM | POA: Diagnosis not present

## 2019-08-22 DIAGNOSIS — I491 Atrial premature depolarization: Secondary | ICD-10-CM | POA: Diagnosis not present

## 2019-08-22 DIAGNOSIS — S42022A Displaced fracture of shaft of left clavicle, initial encounter for closed fracture: Secondary | ICD-10-CM | POA: Diagnosis not present

## 2019-08-22 DIAGNOSIS — R220 Localized swelling, mass and lump, head: Secondary | ICD-10-CM | POA: Diagnosis not present

## 2019-08-22 DIAGNOSIS — S3993XA Unspecified injury of pelvis, initial encounter: Secondary | ICD-10-CM | POA: Diagnosis not present

## 2019-08-22 DIAGNOSIS — I493 Ventricular premature depolarization: Secondary | ICD-10-CM | POA: Diagnosis not present

## 2019-08-22 DIAGNOSIS — S52612A Displaced fracture of left ulna styloid process, initial encounter for closed fracture: Secondary | ICD-10-CM | POA: Diagnosis not present

## 2019-08-22 DIAGNOSIS — S0990XA Unspecified injury of head, initial encounter: Principal | ICD-10-CM

## 2019-08-22 DIAGNOSIS — S42002A Fracture of unspecified part of left clavicle, initial encounter for closed fracture: Secondary | ICD-10-CM | POA: Diagnosis not present

## 2019-08-22 DIAGNOSIS — H5460 Unqualified visual loss, one eye, unspecified: Secondary | ICD-10-CM | POA: Diagnosis present

## 2019-08-22 DIAGNOSIS — S22029A Unspecified fracture of second thoracic vertebra, initial encounter for closed fracture: Secondary | ICD-10-CM | POA: Diagnosis present

## 2019-08-22 DIAGNOSIS — Z79811 Long term (current) use of aromatase inhibitors: Secondary | ICD-10-CM | POA: Diagnosis not present

## 2019-08-22 DIAGNOSIS — R519 Headache, unspecified: Secondary | ICD-10-CM | POA: Diagnosis not present

## 2019-08-22 DIAGNOSIS — Z743 Need for continuous supervision: Secondary | ICD-10-CM | POA: Diagnosis not present

## 2019-08-22 DIAGNOSIS — I10 Essential (primary) hypertension: Secondary | ICD-10-CM | POA: Diagnosis present

## 2019-08-22 DIAGNOSIS — S42025A Nondisplaced fracture of shaft of left clavicle, initial encounter for closed fracture: Secondary | ICD-10-CM | POA: Diagnosis not present

## 2019-08-22 DIAGNOSIS — S52571A Other intraarticular fracture of lower end of right radius, initial encounter for closed fracture: Secondary | ICD-10-CM | POA: Diagnosis not present

## 2019-08-22 DIAGNOSIS — S065X0A Traumatic subdural hemorrhage without loss of consciousness, initial encounter: Secondary | ICD-10-CM | POA: Diagnosis not present

## 2019-08-22 DIAGNOSIS — S199XXA Unspecified injury of neck, initial encounter: Secondary | ICD-10-CM | POA: Diagnosis not present

## 2019-08-22 DIAGNOSIS — S065XAA Traumatic subdural hemorrhage with loss of consciousness status unknown, initial encounter: Secondary | ICD-10-CM

## 2019-08-22 DIAGNOSIS — I609 Nontraumatic subarachnoid hemorrhage, unspecified: Secondary | ICD-10-CM | POA: Diagnosis not present

## 2019-08-22 HISTORY — DX: Malignant (primary) neoplasm, unspecified: C80.1

## 2019-08-22 LAB — URINALYSIS, ROUTINE W REFLEX MICROSCOPIC
Bilirubin Urine: NEGATIVE
Glucose, UA: NEGATIVE mg/dL
Hgb urine dipstick: NEGATIVE
Ketones, ur: 5 mg/dL — AB
Leukocytes,Ua: NEGATIVE
Nitrite: NEGATIVE
Protein, ur: NEGATIVE mg/dL
Specific Gravity, Urine: 1.024 (ref 1.005–1.030)
pH: 8 (ref 5.0–8.0)

## 2019-08-22 LAB — RESPIRATORY PANEL BY RT PCR (FLU A&B, COVID)
Influenza A by PCR: NEGATIVE
Influenza B by PCR: NEGATIVE
SARS Coronavirus 2 by RT PCR: NEGATIVE

## 2019-08-22 LAB — COMPREHENSIVE METABOLIC PANEL
ALT: 20 U/L (ref 0–44)
AST: 24 U/L (ref 15–41)
Albumin: 3.7 g/dL (ref 3.5–5.0)
Alkaline Phosphatase: 78 U/L (ref 38–126)
Anion gap: 7 (ref 5–15)
BUN: 21 mg/dL (ref 8–23)
CO2: 24 mmol/L (ref 22–32)
Calcium: 9.4 mg/dL (ref 8.9–10.3)
Chloride: 110 mmol/L (ref 98–111)
Creatinine, Ser: 0.92 mg/dL (ref 0.44–1.00)
Glucose, Bld: 121 mg/dL — ABNORMAL HIGH (ref 70–99)
Potassium: 4.5 mmol/L (ref 3.5–5.1)
Sodium: 141 mmol/L (ref 135–145)
Total Bilirubin: 1.3 mg/dL — ABNORMAL HIGH (ref 0.3–1.2)
Total Protein: 7.1 g/dL (ref 6.5–8.1)

## 2019-08-22 LAB — CBC
HCT: 36.5 % (ref 36.0–46.0)
Hemoglobin: 11.6 g/dL — ABNORMAL LOW (ref 12.0–15.0)
MCH: 29.7 pg (ref 26.0–34.0)
MCHC: 31.8 g/dL (ref 30.0–36.0)
MCV: 93.4 fL (ref 80.0–100.0)
Platelets: 159 10*3/uL (ref 150–400)
RBC: 3.91 MIL/uL (ref 3.87–5.11)
RDW: 13.3 % (ref 11.5–15.5)
WBC: 9.1 10*3/uL (ref 4.0–10.5)
nRBC: 0 % (ref 0.0–0.2)

## 2019-08-22 LAB — I-STAT CHEM 8, ED
BUN: 29 mg/dL — ABNORMAL HIGH (ref 8–23)
Calcium, Ion: 1.14 mmol/L — ABNORMAL LOW (ref 1.15–1.40)
Chloride: 111 mmol/L (ref 98–111)
Creatinine, Ser: 0.8 mg/dL (ref 0.44–1.00)
Glucose, Bld: 111 mg/dL — ABNORMAL HIGH (ref 70–99)
HCT: 35 % — ABNORMAL LOW (ref 36.0–46.0)
Hemoglobin: 11.9 g/dL — ABNORMAL LOW (ref 12.0–15.0)
Potassium: 4.7 mmol/L (ref 3.5–5.1)
Sodium: 141 mmol/L (ref 135–145)
TCO2: 25 mmol/L (ref 22–32)

## 2019-08-22 LAB — SAMPLE TO BLOOD BANK

## 2019-08-22 LAB — CBG MONITORING, ED: Glucose-Capillary: 114 mg/dL — ABNORMAL HIGH (ref 70–99)

## 2019-08-22 LAB — PROTIME-INR
INR: 1.1 (ref 0.8–1.2)
Prothrombin Time: 13.4 seconds (ref 11.4–15.2)

## 2019-08-22 LAB — LACTIC ACID, PLASMA: Lactic Acid, Venous: 1.3 mmol/L (ref 0.5–1.9)

## 2019-08-22 LAB — ETHANOL: Alcohol, Ethyl (B): <10 mg/dL (ref <10)

## 2019-08-22 MED ORDER — IOHEXOL 300 MG/ML  SOLN
100.0000 mL | Freq: Once | INTRAMUSCULAR | Status: AC | PRN
  Administered 2019-08-22: 100 mL via INTRAVENOUS

## 2019-08-22 MED ORDER — LEVETIRACETAM 500 MG PO TABS
500.0000 mg | ORAL_TABLET | Freq: Two times a day (BID) | ORAL | Status: DC
  Administered 2019-08-23 – 2019-08-26 (×8): 500 mg via ORAL
  Filled 2019-08-22 (×8): qty 1

## 2019-08-22 MED ORDER — MIDAZOLAM HCL (PF) 10 MG/2ML IJ SOLN: 5.0000 mg | INTRAMUSCULAR | Status: DC | PRN

## 2019-08-22 NOTE — Progress Notes (Signed)
Orthopedic Tech Progress Note Patient Details:  Olivia Gray 25-Apr-1933 [id number] Gray 2 trauma not needed at this second  Patient ID: Olivia Gray, adult   DOB: [birth year], 86 y.o.   MRN: [id number]   Olivia Gray 08/22/2019, 5:49 PM

## 2019-08-22 NOTE — H&P (Signed)
History   Olivia Gray is an 86 y.o. female.   Chief Complaint:  Chief Complaint  Patient presents with  . fall level 2    HPI This is an 86 yo female with baseline blindness who presents as a level 2 trauma after apparently falling down about a half-flight of stairs.  Repetitive questioning.  Complaining of mid-back and left wrist pain with deformity.  No chest pain or SOB.  No abdominal pain.    She underwent thorough work-up by EDP and we are being asked to admit.  Past Medical History:  Diagnosis Date  . Cancer [facility])     Past Surgical History:  Procedure Laterality Date  . BRAIN SURGERY    . CARDIAC SURGERY    [place] holes - [place] - 2020  History reviewed. No pertinent family history. Social History:  reports that she has never smoked. She has never used smokeless tobacco. She reports previous alcohol use. She reports previous drug use.  Allergies  No Known Allergies  Home Medications   Prior to Admission medications   Medication Sig Start Date End Date Taking? Authorizing Provider  amLODipine (NORVASC) 10 MG tablet Take 10 mg by mouth daily.   Yes [provider]  aspirin 81 MG chewable tablet Chew 81 mg by mouth daily.   Yes [provider]  carvedilol (COREG) 3.125 MG tablet Take 3.125 mg by mouth 2 (two) times daily with a meal.   Yes [provider]  cholecalciferol (VITAMIN D3) 25 MCG (1000 UNIT) tablet Take 1,000 Units by mouth daily.   Yes [provider]  Cyanocobalamin (VITAMIN B 12 PO) Take 100 mcg by mouth daily.   Yes [provider]  escitalopram (LEXAPRO) 10 MG tablet Take 10 mg by mouth daily.   Yes [provider]  ipratropium (ATROVENT) 0.03 % nasal spray Place 2 sprays into both nostrils 3 (three) times daily.   Yes [provider]  letrozole (FEMARA) 2.5 MG tablet Take 2.5 mg by mouth daily.   Yes [provider]  PRESCRIPTION MEDICATION Place 1 drop into both eyes daily.  Medication for eye pressure. ( Patient don't know the name)   Yes [provider]  simvastatin (ZOCOR) 10 MG tablet Take 10 mg by mouth daily.   Yes [provider]  valsartan (DIOVAN) 320 MG tablet Take 320 mg by mouth daily.   Yes [provider]     Trauma Course   Results for orders placed or performed during the hospital encounter of 08/22/19 (from the past 48 hour(s))  CBG monitoring, ED     Status: Abnormal   Collection Time: 08/22/19  5:33 PM  Result Value Ref Range   Glucose-Capillary 114 (H) 70 - 99 mg/dL    Comment: Glucose reference range applies only to samples taken after fasting for at least 8 hours. QA FLAGS AND/OR RANGES MODIFIED BY DEMOGRAPHIC UPDATE ON 04/27 AT 1753 QA FLAGS AND/OR RANGES MODIFIED BY DEMOGRAPHIC UPDATE ON 04/27 AT 1754 QA FLAGS AND/OR RANGES MODIFIED BY DEMOGRAPHIC UPDATE ON 04/27 AT 1754 QA FLAGS AND/OR RANGES MODIFIED BY DEMOGRAPHIC UPDATE ON 04/27 AT 1756   I-stat chem 8, ED (not at [facility] or [facility])     Status: Abnormal   Collection Time: 08/22/19  5:55 PM  Result Value Ref Range   Sodium 141 135 - 145 mmol/L   Potassium 4.7 3.5 - 5.1 mmol/L   Chloride 111 98 - 111 mmol/L   BUN 29 (H) 8 - 23  mg/dL   Creatinine, Ser 0.80 0.44 - 1.00 mg/dL    Comment: QA FLAGS AND/OR RANGES MODIFIED BY DEMOGRAPHIC UPDATE ON 04/27 AT 1949   Glucose, Bld 111 (H) 70 - 99 mg/dL    Comment: Glucose reference range applies only to samples taken after fasting for at least 8 hours.   Calcium, Ion 1.14 (L) 1.15 - 1.40 mmol/L   TCO2 25 22 - 32 mmol/L   Hemoglobin 11.9 (L) 12.0 - 15.0 g/dL    Comment: QA FLAGS AND/OR RANGES MODIFIED BY DEMOGRAPHIC UPDATE ON 04/27 AT 1949   HCT 35.0 (L) 36.0 - 46.0 %    Comment: QA FLAGS AND/OR RANGES MODIFIED BY DEMOGRAPHIC UPDATE ON 04/27 AT 1949  Sample to Blood Bank     Status: None   Collection Time: 08/22/19  6:05 PM  Result Value Ref Range   Blood Bank Specimen SAMPLE AVAILABLE FOR TESTING    Sample  Expiration      08/23/2019,2359 Performed at [facility], [place] [address], [place], [place] [address]   Comprehensive metabolic panel     Status: Abnormal   Collection Time: 08/22/19  6:08 PM  Result Value Ref Range   Sodium 141 135 - 145 mmol/L   Potassium 4.5 3.5 - 5.1 mmol/L   Chloride 110 98 - 111 mmol/L   CO2 24 22 - 32 mmol/L   Glucose, Bld 121 (H) 70 - 99 mg/dL    Comment: Glucose reference range applies only to samples taken after fasting for at least 8 hours.   BUN 21 8 - 23 mg/dL   Creatinine, Ser 0.92 0.44 - 1.00 mg/dL    Comment: QA FLAGS AND/OR RANGES MODIFIED BY DEMOGRAPHIC UPDATE ON 04/27 AT 1949   Calcium 9.4 8.9 - 10.3 mg/dL   Total Protein 7.1 6.5 - 8.1 g/dL   Albumin 3.7 3.5 - 5.0 g/dL   AST 24 15 - 41 U/L   ALT 20 0 - 44 U/L   Alkaline Phosphatase 78 38 - 126 U/L   Total Bilirubin 1.3 (H) 0.3 - 1.2 mg/dL   GFR calc non Af Amer NOT CALCULATED >60 mL/min   GFR calc Af Amer NOT CALCULATED >60 mL/min   Anion gap 7 5 - 15    Comment: Performed at [facility] [address] [place], [place] [address]  CBC     Status: Abnormal   Collection Time: 08/22/19  6:08 PM  Result Value Ref Range   WBC 9.1 4.0 - 10.5 K/uL   RBC 3.91 3.87 - 5.11 MIL/uL    Comment: QA FLAGS AND/OR RANGES MODIFIED BY DEMOGRAPHIC UPDATE ON 04/27 AT 1949   Hemoglobin 11.6 (L) 12.0 - 15.0 g/dL    Comment: QA FLAGS AND/OR RANGES MODIFIED BY DEMOGRAPHIC UPDATE ON 04/27 AT 1949   HCT 36.5 36.0 - 46.0 %    Comment: QA FLAGS AND/OR RANGES MODIFIED BY DEMOGRAPHIC UPDATE ON 04/27 AT 1949   MCV 93.4 80.0 - 100.0 fL   MCH 29.7 26.0 - 34.0 pg   MCHC 31.8 30.0 - 36.0 g/dL   RDW 13.3 11.5 - 15.5 %   Platelets 159 150 - 400 K/uL   nRBC 0.0 0.0 - 0.2 %    Comment: Performed at [facility], [place] [address], [place],  [address]  Ethanol     Status: None   Collection Time: 08/22/19  6:08 PM  Result Value Ref Range   Alcohol, Ethyl (B) <10 <10  mg/dL    Comment: (NOTE) Lowest  detectable limit for serum alcohol is 10 mg/dL. For medical purposes only. Performed at [facility], [place] [address], [place], [place] [address]   Lactic acid, plasma     Status: None   Collection Time: 08/22/19  6:08 PM  Result Value Ref Range   Lactic Acid, Venous 1.3 0.5 - 1.9 mmol/L    Comment: Performed at [facility] [address] [place], [place] [address]  Protime-INR     Status: None   Collection Time: 08/22/19  6:08 PM  Result Value Ref Range   Prothrombin Time 13.4 11.4 - 15.2 seconds   INR 1.1 0.8 - 1.2    Comment: (NOTE) INR goal varies based on device and disease states. Performed at [facility], [place] [address] [place], [place] [address]   Respiratory Panel by RT PCR (Flu A&B, Covid) - Nasopharyngeal Swab     Status: None   Collection Time: 08/22/19  6:10 PM   Specimen: Nasopharyngeal Swab  Result Value Ref Range   SARS Coronavirus 2 by RT PCR NEGATIVE NEGATIVE    Comment: (NOTE) SARS-CoV-2 target nucleic acids are NOT DETECTED. The SARS-CoV-2 RNA is generally detectable in upper respiratoy specimens during the acute phase of infection. The lowest concentration of SARS-CoV-2 viral copies this assay can detect is 131 copies/mL. A negative result does not preclude SARS-Cov-2 infection and should not be used as the sole basis for treatment or other patient management decisions. A negative result may occur with  improper specimen collection/handling, submission of specimen other than nasopharyngeal swab, presence of viral mutation(s) within the areas targeted by this assay, and inadequate number of viral copies (<131 copies/mL). A negative result must be combined with clinical observations, patient history, and epidemiological information. The expected result is Negative. Fact Sheet for Patients:  PinkCheek.be Fact Sheet for Healthcare Providers:  GravelBags.it This test is not yet ap  proved or cleared by the [place] FDA and  has been authorized for detection and/or diagnosis of SARS-CoV-2 by FDA under an Emergency Use Authorization (EUA). This EUA will remain  in effect (meaning this test can be used) for the duration of the COVID-19 declaration under Section 564(b)(1) of the Act, 21 U.S.C. section 360bbb-3(b)(1), unless the authorization is terminated or revoked sooner.    Influenza A by PCR NEGATIVE NEGATIVE   Influenza B by PCR NEGATIVE NEGATIVE    Comment: (NOTE) The Xpert Xpress SARS-CoV-2/FLU/RSV assay is intended as an aid in  the diagnosis of influenza from Nasopharyngeal swab specimens and  should not be used as a sole basis for treatment. Nasal washings and  aspirates are unacceptable for Xpert Xpress SARS-CoV-2/FLU/RSV  testing. Fact Sheet for Patients: PinkCheek.be Fact Sheet for Healthcare Providers: GravelBags.it This test is not yet approved or cleared by the [place] FDA and  has been authorized for detection and/or diagnosis of SARS-CoV-2 by  FDA under an Emergency Use Authorization (EUA). This EUA will remain  in effect (meaning this test can be used) for the duration of the  Covid-19 declaration under Section 564(b)(1) of the Act, 21  U.S.C. section 360bbb-3(b)(1), unless the authorization is  terminated or revoked. Performed at [facility], [place] [address] [place], [place] [address]   Urinalysis, Routine w reflex microscopic     Status: Abnormal   Collection Time: 08/22/19  9:19 PM  Result Value Ref Range   Color, Urine STRAW (A) YELLOW  APPearance CLEAR CLEAR   Specific Gravity, Urine 1.024 1.005 - 1.030   pH 8.0 5.0 - 8.0   Glucose, UA NEGATIVE NEGATIVE mg/dL   Hgb urine dipstick NEGATIVE NEGATIVE   Bilirubin Urine NEGATIVE NEGATIVE   Ketones, ur 5 (A) NEGATIVE mg/dL   Protein, ur NEGATIVE NEGATIVE mg/dL   Nitrite NEGATIVE NEGATIVE   Leukocytes,Ua NEGATIVE  NEGATIVE    Comment: Performed at [facility] [address], [place], [place] [address]   DG Wrist Complete Left  Result Date: 08/22/2019 CLINICAL DATA:  [address] down stairs.  Wrist pain and deformity. EXAM: LEFT WRIST - COMPLETE 3+ VIEW COMPARISON:  None. FINDINGS: Colles type fractures of the ulnar styloid and distal radial metaphysis, with slight ventral angulation and dorsal tilt of the distal radial articular surface. Chronic degenerative changes affect the first carpometacarpal joint. IMPRESSION: Colles fractures of the distal radius and ulnar styloid. Electronically Signed   By: Nelson Chimes M.D.   On: 08/22/2019 18:10   CT HEAD WO CONTRAST  Result Date: 08/22/2019 CLINICAL DATA:  Status post trauma. EXAM: CT HEAD WITHOUT CONTRAST CT MAXILLOFACIAL WITHOUT CONTRAST TECHNIQUE: Multidetector CT imaging of the head and maxillofacial structures were performed using the standard protocol without intravenous contrast. Multiplanar CT image reconstructions of the maxillofacial structures were also generated. COMPARISON:  April 11, 2019 FINDINGS: CT HEAD FINDINGS Brain: There is mild cerebral atrophy with widening of the extra-axial spaces and ventricular dilatation. There are areas of decreased attenuation within the white matter tracts of the supratentorial brain, consistent with microvascular disease changes. A 4 mm thick acute on chronic right posterior parietal subdural hemorrhage is seen. This is decreased in size when compared to the prior study. A 1.6 mm thick linear hyperdense area is seen within the extra-axial space of the right frontal lobe (axial CT image 18, CT series number 3). There is no evidence of associated mass effect or midline shift. A small area of cortical encephalomalacia, with adjacent chronic white matter low attenuation, is seen within the right frontal lobe. Stable areas of parenchymal calcification are seen along the posteromedial aspect of the right occipital lobe.  Vascular: No hyperdense vessel is identified. Skull: A small burr hole is seen along the posterior aspect of the vertex on the right and lateral aspect of the vertex on the left. Other: Mild left posterior parietal scalp soft tissue swelling is seen with moderate to marked severity frontal scalp soft tissue swelling noted along the midline. An associated diffuse frontal scalp soft tissue hematoma is present. Additional mild focal areas of scalp soft tissue swelling are seen along the anterolateral and posterolateral aspect of the vertex on the left. CT MAXILLOFACIAL FINDINGS Osseous: No fracture or mandibular dislocation. No destructive process. Orbits: Negative. No traumatic or inflammatory finding. Sinuses: Clear. Soft tissues: Mild bilateral superior paranasal soft tissue swelling is seen with moderate to marked severity frontal scalp soft tissue swelling noted along the midline. IMPRESSION: 1. 4 mm thick acute on chronic right posterior parietal subdural hemorrhage, decreased in size when compared to the prior study. 2. 1.6 mm thick linear hyperdense area is seen within the extra-axial space of the right frontal lobe which may represent a small amount of acute subdural hemorrhage. 3. Mild cerebral atrophy with widening of the extra-axial spaces and ventricular dilatation. 4. Mild left posterior parietal scalp soft tissue swelling with moderate to marked severity frontal scalp soft tissue swelling and hematoma noted along the midline. 5. Mild bilateral superior paranasal soft tissue swelling.  6. No acute facial bone fracture. Electronically Signed   By: Virgina Norfolk M.D.   On: 08/22/2019 20:35   CT CHEST W CONTRAST  Result Date: 08/22/2019 CLINICAL DATA:  Status post trauma. EXAM: CT CHEST, ABDOMEN, AND PELVIS WITH CONTRAST TECHNIQUE: Multidetector CT imaging of the chest, abdomen and pelvis was performed following the standard protocol during bolus administration of intravenous contrast. CONTRAST:   148mL OMNIPAQUE IOHEXOL 300 MG/ML  SOLN COMPARISON:  None. FINDINGS: CT CHEST FINDINGS Cardiovascular: There is moderate severity calcification of the aortic arch. Normal heart size. No pericardial effusion. Moderate severity coronary artery calcification is seen. Mediastinum/Nodes: No enlarged mediastinal, hilar, or axillary lymph nodes. Thyroid gland, trachea, and esophagus demonstrate no significant findings. Lungs/Pleura: Very mild atelectasis is seen within the bilateral lower lobes and left upper lobe. There is no evidence of a pleural effusion or pneumothorax. Musculoskeletal: Multiple sternal wires are present. A chronic fracture deformity is seen involving the proximal aspect of the right clavicle. Acute fracture of the proximal left clavicle is seen. Acute anterolateral third and fourth left rib fractures are seen. CT ABDOMEN PELVIS FINDINGS Hepatobiliary: No focal liver abnormality is seen. Status post cholecystectomy. No biliary dilatation. Pancreas: Unremarkable. No pancreatic ductal dilatation or surrounding inflammatory changes. Spleen: Normal in size without focal abnormality. Adrenals/Urinary Tract: There is a 2.5 cm x 1.6 cm heterogeneous right adrenal mass. The left adrenal gland is normal in appearance. Kidneys are normal in size, without renal calculi or focal lesions. There is mild bilateral hydronephrosis. Bladder is unremarkable. Stomach/Bowel: Stomach is within normal limits. Appendix appears normal. No evidence of bowel wall thickening, distention, or inflammatory changes. Noninflamed diverticula are seen throughout the sigmoid colon. Vascular/Lymphatic: Moderate severity aortic calcification. No enlarged abdominal or pelvic lymph nodes. Reproductive: Uterus and bilateral adnexa are unremarkable. Other: No abdominal wall hernia or abnormality. No abdominopelvic ascites. Musculoskeletal: Multilevel degenerative changes seen throughout the lumbar spine IMPRESSION: 1. Acute anterolateral third  and fourth left rib fractures. 2. Acute fracture of the proximal left clavicle. 3. Very mild bilateral lower lobe and left upper lobe atelectasis. 4. 2.5 cm x 1.6 cm heterogeneous right adrenal mass. Further evaluation with adrenal protocol CT or MRI is recommended. 5. Noninflamed sigmoid diverticulosis. Aortic Atherosclerosis (ICD10-I70.0). Electronically Signed   By: Virgina Norfolk M.D.   On: 08/22/2019 20:48   CT CERVICAL SPINE WO CONTRAST  Result Date: 08/22/2019 CLINICAL DATA:  Status post trauma. EXAM: CT CERVICAL SPINE WITHOUT CONTRAST TECHNIQUE: Multidetector CT imaging of the cervical spine was performed without intravenous contrast. Multiplanar CT image reconstructions were also generated. COMPARISON:  April 08, 2019 FINDINGS: Alignment: Normal. Skull base and vertebrae: No acute cervical spine fracture. Acute fracture of the posterior aspect of the T2 vertebral body is seen. There is mild subsequent mass effect on the adjacent portion of the anterior spinal canal and spinal cord (axial CT image 35, CT series number 8). Soft tissues and spinal canal: Mild mass effect on the anterior aspect of the spinal canal and spinal cord at the level of the T2 vertebral body, as described above. Disc levels: Mild multilevel endplate sclerosis is seen with mild to moderate severity multilevel intervertebral disc space narrowing. This is predominant stable in severity when compared to the prior study. Moderate severity bilateral multilevel facet joint hypertrophy is noted. Upper chest: Negative. Other: None. IMPRESSION: Acute fracture of the posterior aspect of the T2 vertebral body with mild subsequent mass effect on the adjacent portion of the anterior spinal canal and spinal  cord. MRI correlation is recommended. Electronically Signed   By: Virgina Norfolk M.D.   On: 08/22/2019 20:54   CT ABDOMEN PELVIS W CONTRAST  Result Date: 08/22/2019 CLINICAL DATA:  Status post trauma. EXAM: CT CHEST, ABDOMEN, AND  PELVIS WITH CONTRAST TECHNIQUE: Multidetector CT imaging of the chest, abdomen and pelvis was performed following the standard protocol during bolus administration of intravenous contrast. CONTRAST:  154mL OMNIPAQUE IOHEXOL 300 MG/ML  SOLN COMPARISON:  None. FINDINGS: CT CHEST FINDINGS Cardiovascular: There is moderate severity calcification of the aortic arch. Normal heart size. No pericardial effusion. Moderate severity coronary artery calcification is seen. Mediastinum/Nodes: No enlarged mediastinal, hilar, or axillary lymph nodes. Thyroid gland, trachea, and esophagus demonstrate no significant findings. Lungs/Pleura: Very mild atelectasis is seen within the bilateral lower lobes and left upper lobe. There is no evidence of a pleural effusion or pneumothorax. Musculoskeletal: Multiple sternal wires are present. A chronic fracture deformity is seen involving the proximal aspect of the right clavicle. Acute fracture of the proximal left clavicle is seen. Acute anterolateral third and fourth left rib fractures are seen. CT ABDOMEN PELVIS FINDINGS Hepatobiliary: No focal liver abnormality is seen. Status post cholecystectomy. No biliary dilatation. Pancreas: Unremarkable. No pancreatic ductal dilatation or surrounding inflammatory changes. Spleen: Normal in size without focal abnormality. Adrenals/Urinary Tract: There is a 2.5 cm x 1.6 cm heterogeneous right adrenal mass. The left adrenal gland is normal in appearance. Kidneys are normal in size, without renal calculi or focal lesions. There is mild bilateral hydronephrosis. Bladder is unremarkable. Stomach/Bowel: Stomach is within normal limits. Appendix appears normal. No evidence of bowel wall thickening, distention, or inflammatory changes. Noninflamed diverticula are seen throughout the sigmoid colon. Vascular/Lymphatic: Moderate severity aortic calcification. No enlarged abdominal or pelvic lymph nodes. Reproductive: Uterus and bilateral adnexa are  unremarkable. Other: No abdominal wall hernia or abnormality. No abdominopelvic ascites. Musculoskeletal: Multilevel degenerative changes seen throughout the lumbar spine IMPRESSION: 1. Acute anterolateral third and fourth left rib fractures. 2. Acute fracture of the proximal left clavicle. 3. Very mild bilateral lower lobe and left upper lobe atelectasis. 4. 2.5 cm x 1.6 cm heterogeneous right adrenal mass. Further evaluation with adrenal protocol CT or MRI is recommended. 5. Noninflamed sigmoid diverticulosis. Aortic Atherosclerosis (ICD10-I70.0). Electronically Signed   By: Virgina Norfolk M.D.   On: 08/22/2019 20:48   DG Pelvis Portable  Result Date: 08/22/2019 CLINICAL DATA:  [address] down stairs EXAM: PORTABLE PELVIS 1-2 VIEWS COMPARISON:  None. FINDINGS: There is no evidence of pelvic fracture or diastasis. No pelvic bone lesions are seen. IMPRESSION: Negative. Electronically Signed   By: Nelson Chimes M.D.   On: 08/22/2019 18:09   DG Chest Port 1 View  Result Date: 08/22/2019 CLINICAL DATA:  [address] down stairs. EXAM: PORTABLE CHEST 1 VIEW COMPARISON:  None. FINDINGS: Previous median sternotomy and CABG. Mild cardiomegaly. The lungs are clear. No pneumothorax or hemothorax. No acute finding seen in the region. IMPRESSION: Previous CABG. Cardiomegaly. No active disease or traumatic finding. Electronically Signed   By: Nelson Chimes M.D.   On: 08/22/2019 18:08   CT MAXILLOFACIAL WO CONTRAST  Result Date: 08/22/2019 CLINICAL DATA:  Status post trauma. EXAM: CT HEAD WITHOUT CONTRAST CT MAXILLOFACIAL WITHOUT CONTRAST TECHNIQUE: Multidetector CT imaging of the head and maxillofacial structures were performed using the standard protocol without intravenous contrast. Multiplanar CT image reconstructions of the maxillofacial structures were also generated. COMPARISON:  None. FINDINGS: CT HEAD FINDINGS Brain: There is mild cerebral atrophy with widening of the extra-axial spaces  and ventricular dilatation. There  are areas of decreased attenuation within the white matter tracts of the supratentorial brain, consistent with microvascular disease changes. A 4 mm thick acute on chronic right posterior parietal subdural hemorrhage is seen. This is decreased in size when compared to the prior study. A 1.6 mm thick linear hyperdense area is seen within the extra-axial space of the right frontal lobe (axial CT image 18, CT series number 3). There is no evidence of associated mass effect or midline shift. A small area of cortical encephalomalacia, with adjacent chronic white matter low attenuation, is seen within the right frontal lobe. Stable areas of parenchymal calcification are seen along the posteromedial aspect of the right occipital lobe. Vascular: No hyperdense vessel is identified. Skull: A small burr hole is seen along the posterior aspect of the vertex on the right and lateral aspect of the vertex on the left. Other: Mild left posterior parietal scalp soft tissue swelling is seen with moderate to marked severity frontal scalp soft tissue swelling noted along the midline. An associated diffuse frontal scalp soft tissue hematoma is present. Additional mild focal areas of scalp soft tissue swelling are seen along the anterolateral and posterolateral aspect of the vertex on the left. CT MAXILLOFACIAL FINDINGS Osseous: No fracture or mandibular dislocation. No destructive process. Orbits: Negative. No traumatic or inflammatory finding. Sinuses: Clear. Soft tissues: Mild bilateral superior paranasal soft tissue swelling is seen with moderate to marked severity frontal scalp soft tissue swelling noted along the midline. IMPRESSION: 1. 4 mm thick acute on chronic right posterior parietal subdural hemorrhage, decreased in size when compared to the prior study. 2. 1.6 mm thick linear hyperdense area is seen within the extra-axial space of the right frontal lobe which may represent a small amount of acute subdural hemorrhage. 3. Mild  cerebral atrophy with widening of the extra-axial spaces and ventricular dilatation. 4. Mild left posterior parietal scalp soft tissue swelling with moderate to marked severity frontal scalp soft tissue swelling and hematoma noted along the midline. 5. Mild bilateral superior paranasal soft tissue swelling. 6. No acute facial bone fracture. Electronically Signed   By: Virgina Norfolk M.D.   On: 08/22/2019 20:37    Review of Systems  Constitutional: No fever/chills Eyes: No visual changes. ENT: No sore throat. Cardiovascular: Denies chest pain. Respiratory: Denies shortness of breath. Gastrointestinal: No abdominal pain.  No nausea, no vomiting.  No diarrhea.  No constipation. Genitourinary: Negative for dysuria. Musculoskeletal: Positive back and left wrist pain.  Skin: Negative for rash. Neurological: Negative for focal weakness or numbness. Positive mild HA.  Blood pressure (!) 162/75, pulse 87, temperature 98.1 F (36.7 C), temperature source Oral, resp. rate (!) 25, height 5\' 4"  (1.626 m), weight 60.8 kg, SpO2 98 %. Physical Exam Constitutional: Alert and oriented. Well appearing and in no acute distress. Eyes: Conjunctivae are normal. Pupils equal but baseline blindness limits exam.  Head: Atraumatic. Nose: No congestion/rhinnorhea. Mouth/Throat: Mucous membranes are moist.  Oropharynx non-erythematous. Neck: No stridor. C-collar in place.  Cardiovascular: Normal rate, regular rhythm. Good peripheral circulation. Grossly normal heart sounds.   Respiratory: Normal respiratory effort.  No retractions. Lungs CTAB. Gastrointestinal: Soft and nontender. No distention.  Musculoskeletal: No lower extremity tenderness nor edema. No gross deformities of extremities.  No tenderness to palpation over the femurs.  Normal range of motion of the hips and knees bilaterally.  Patient with swelling over the left wrist with intact pulses and sensation.  No tenderness over the elbow or shoulders.  Neurologic:  Normal speech and language. No gross focal neurologic deficits are appreciated.  Skin:  Skin is warm and dry.  Abrasion to the forehead and small abrasion to the upper lip.  Assessment/Plan Fall down stairs 1.  Acute on chronic right posterior parietal SDH 2.  Right frontal acute SDH 3.  Scalp hematoma 4.  T2 vertebral body fracture with some mass effect on spinal canal 5.  Left anterior 3-4 rib fractures 6.  Left clavicle fracture 7.  Left wrist Colles fracture  Admit to [facility] to evaluate acute spinal fracture Pulmonary toilet Wrist splint  Hand - [facility] - Madlyn Frankel K Aydia Maj 08/22/2019, 10:41 PM   Procedures

## 2019-08-22 NOTE — ED Triage Notes (Signed)
Pt BIB [facility] from home c/o a fall. Level 2 was activated. Pt is blind and was home alone at the time and attempted to go up the stairs. Not sure if pt fell forward or backwards down the stairs. Pt has a contusion to the middle of the head and obvious left wrist deformity. Pt is also c/o of back pain. Pt is alert and oriented x4.

## 2019-08-22 NOTE — Progress Notes (Signed)
Orthopedic Tech Progress Note Patient Details:  Olivia Gray 1932/12/29 [id number]  Ortho Devices Type of Ortho Device: Arm sling, Sugartong splint Ortho Device/Splint Location: ULE Ortho Device/Splint Interventions: Application, Ordered   Post Interventions Patient Tolerated: Well Instructions Provided: Care of device   Karolee Stamps 08/22/2019, 10:50 PM

## 2019-08-22 NOTE — ED Provider Notes (Signed)
Emergency Department Provider Note   I have reviewed the triage vital signs and the nursing notes.   HISTORY  Chief Complaint fall level 2   HPI Olivia Gray is a 86 y.o. female presents emergency department as a level 2 trauma.  Patient was found down at home after apparently falling down the stairs.  EMS report that the patient was found down but was awake and alert.  There was some confusion with repetitive questioning on scene.  Patient has baseline blindness which is noted.  They found her to be complaining of mid back pain and left wrist pain with some swelling in that location.  She also has a hematoma to the forehead.  She denies any chest pain or shortness of breath.  No abdominal pain.  Some blood was found approximately halfway up the steps and EMS feel that she may have fallen from at least that high up.  She is not anticoagulated. Pain is mild/moderate and worse with movement.   Past Medical History:  Diagnosis Date   Cancer ([place])     There are no problems to display for this patient.   Past Surgical History:  Procedure Laterality Date   BRAIN SURGERY     CARDIAC SURGERY      Allergies Patient has no known allergies.  History reviewed. No pertinent family history.  Social History Social History   Tobacco Use   Smoking status: Never Smoker   Smokeless tobacco: Never Used  Substance Use Topics   Alcohol use: Not Currently   Drug use: Not Currently    Review of Systems  Constitutional: No fever/chills Eyes: No visual changes. ENT: No sore throat. Cardiovascular: Denies chest pain. Respiratory: Denies shortness of breath. Gastrointestinal: No abdominal pain.  No nausea, no vomiting.  No diarrhea.  No constipation. Genitourinary: Negative for dysuria. Musculoskeletal: Positive back and left wrist pain.  Skin: Negative for rash. Neurological: Negative for focal weakness or numbness. Positive mild HA.   10-point ROS otherwise  negative.  ____________________________________________   PHYSICAL EXAM:  VITAL SIGNS: ED Triage Vitals  Enc Vitals Group     BP 08/22/19 1735 (!) 148/62     Pulse Rate 08/22/19 1735 75     Resp 08/22/19 1735 14     Temp 08/22/19 1735 98.1 F (36.7 C)     Temp Source 08/22/19 1735 Oral     SpO2 08/22/19 1733 98 %     Weight 08/22/19 1738 134 lb (60.8 kg)     Height 08/22/19 1738 5\' 4"  (1.626 m)   Constitutional: Alert and oriented. Well appearing and in no acute distress. Eyes: Conjunctivae are normal. Pupils equal but baseline blindness limits exam.  Head: Atraumatic. Nose: No congestion/rhinnorhea. Mouth/Throat: Mucous membranes are moist.  Oropharynx non-erythematous. Neck: No stridor. C-collar in place.  Cardiovascular: Normal rate, regular rhythm. Good peripheral circulation. Grossly normal heart sounds.   Respiratory: Normal respiratory effort.  No retractions. Lungs CTAB. Gastrointestinal: Soft and nontender. No distention.  Musculoskeletal: No lower extremity tenderness nor edema. No gross deformities of extremities.  No tenderness to palpation over the femurs.  Normal range of motion of the hips and knees bilaterally.  Patient with swelling over the left wrist with intact pulses and sensation.  No tenderness over the elbow or shoulders.  Neurologic:  Normal speech and language. No gross focal neurologic deficits are appreciated.  Skin:  Skin is warm and dry.  Abrasion to the forehead and small abrasion to the upper lip.  ____________________________________________   LABS (all labs ordered are listed, but only abnormal results are displayed)  Labs Reviewed  COMPREHENSIVE METABOLIC PANEL - Abnormal; Notable for the following components:      Result Value   Glucose, Bld 121 (*)    Total Bilirubin 1.3 (*)    All other components within normal limits  CBC - Abnormal; Notable for the following components:   Hemoglobin 11.6 (*)    All other components within normal  limits  URINALYSIS, ROUTINE W REFLEX MICROSCOPIC - Abnormal; Notable for the following components:   Color, Urine STRAW (*)    Ketones, ur 5 (*)    All other components within normal limits  CBG MONITORING, ED - Abnormal; Notable for the following components:   Glucose-Capillary 114 (*)    All other components within normal limits  I-STAT CHEM 8, ED - Abnormal; Notable for the following components:   BUN 29 (*)    Glucose, Bld 111 (*)    Calcium, Ion 1.14 (*)    Hemoglobin 11.9 (*)    HCT 35.0 (*)    All other components within normal limits  RESPIRATORY PANEL BY RT PCR (FLU A&B, COVID)  ETHANOL  LACTIC ACID, PLASMA  PROTIME-INR  SAMPLE TO BLOOD BANK   ____________________________________________  RADIOLOGY  DG Wrist Complete Left  Result Date: 08/22/2019 CLINICAL DATA:  [address] [address] down stairs.  Wrist pain and deformity. EXAM: LEFT WRIST - COMPLETE 3+ VIEW COMPARISON:  None. FINDINGS: Colles type fractures of the ulnar styloid and distal radial metaphysis, with slight ventral angulation and dorsal tilt of the distal radial articular surface. Chronic degenerative changes affect the first carpometacarpal joint. IMPRESSION: Colles fractures of the distal radius and ulnar styloid. Electronically Signed   By: Nelson Chimes M.D.   On: 08/22/2019 18:10   CT HEAD WO CONTRAST  Result Date: 08/22/2019 CLINICAL DATA:  Status post trauma. EXAM: CT HEAD WITHOUT CONTRAST CT MAXILLOFACIAL WITHOUT CONTRAST TECHNIQUE: Multidetector CT imaging of the head and maxillofacial structures were performed using the standard protocol without intravenous contrast. Multiplanar CT image reconstructions of the maxillofacial structures were also generated. COMPARISON:  April 11, 2019 FINDINGS: CT HEAD FINDINGS Brain: There is mild cerebral atrophy with widening of the extra-axial spaces and ventricular dilatation. There are areas of decreased attenuation within the white matter tracts of the supratentorial brain,  consistent with microvascular disease changes. A 4 mm thick acute on chronic right posterior parietal subdural hemorrhage is seen. This is decreased in size when compared to the prior study. A 1.6 mm thick linear hyperdense area is seen within the extra-axial space of the right frontal lobe (axial CT image 18, CT series number 3). There is no evidence of associated mass effect or midline shift. A small area of cortical encephalomalacia, with adjacent chronic white matter low attenuation, is seen within the right frontal lobe. Stable areas of parenchymal calcification are seen along the posteromedial aspect of the right occipital lobe. Vascular: No hyperdense vessel is identified. Skull: A small burr hole is seen along the posterior aspect of the vertex on the right and lateral aspect of the vertex on the left. Other: Mild left posterior parietal scalp soft tissue swelling is seen with moderate to marked severity frontal scalp soft tissue swelling noted along the midline. An associated diffuse frontal scalp soft tissue hematoma is present. Additional mild focal areas of scalp soft tissue swelling are seen along the anterolateral and posterolateral aspect of the vertex on the left. CT MAXILLOFACIAL FINDINGS Osseous:  No fracture or mandibular dislocation. No destructive process. Orbits: Negative. No traumatic or inflammatory finding. Sinuses: Clear. Soft tissues: Mild bilateral superior paranasal soft tissue swelling is seen with moderate to marked severity frontal scalp soft tissue swelling noted along the midline. IMPRESSION: 1. 4 mm thick acute on chronic right posterior parietal subdural hemorrhage, decreased in size when compared to the prior study. 2. 1.6 mm thick linear hyperdense area is seen within the extra-axial space of the right frontal lobe which may represent a small amount of acute subdural hemorrhage. 3. Mild cerebral atrophy with widening of the extra-axial spaces and ventricular dilatation. 4. Mild  left posterior parietal scalp soft tissue swelling with moderate to marked severity frontal scalp soft tissue swelling and hematoma noted along the midline. 5. Mild bilateral superior paranasal soft tissue swelling. 6. No acute facial bone fracture. Electronically Signed   By: Virgina Norfolk M.D.   On: 08/22/2019 20:35   CT CHEST W CONTRAST  Result Date: 08/22/2019 CLINICAL DATA:  Status post trauma. EXAM: CT CHEST, ABDOMEN, AND PELVIS WITH CONTRAST TECHNIQUE: Multidetector CT imaging of the chest, abdomen and pelvis was performed following the standard protocol during bolus administration of intravenous contrast. CONTRAST:  181mL OMNIPAQUE IOHEXOL 300 MG/ML  SOLN COMPARISON:  None. FINDINGS: CT CHEST FINDINGS Cardiovascular: There is moderate severity calcification of the aortic arch. Normal heart size. No pericardial effusion. Moderate severity coronary artery calcification is seen. Mediastinum/Nodes: No enlarged mediastinal, hilar, or axillary lymph nodes. Thyroid gland, trachea, and esophagus demonstrate no significant findings. Lungs/Pleura: Very mild atelectasis is seen within the bilateral lower lobes and left upper lobe. There is no evidence of a pleural effusion or pneumothorax. Musculoskeletal: Multiple sternal wires are present. A chronic fracture deformity is seen involving the proximal aspect of the right clavicle. Acute fracture of the proximal left clavicle is seen. Acute anterolateral third and fourth left rib fractures are seen. CT ABDOMEN PELVIS FINDINGS Hepatobiliary: No focal liver abnormality is seen. Status post cholecystectomy. No biliary dilatation. Pancreas: Unremarkable. No pancreatic ductal dilatation or surrounding inflammatory changes. Spleen: Normal in size without focal abnormality. Adrenals/Urinary Tract: There is a 2.5 cm x 1.6 cm heterogeneous right adrenal mass. The left adrenal gland is normal in appearance. Kidneys are normal in size, without renal calculi or focal  lesions. There is mild bilateral hydronephrosis. Bladder is unremarkable. Stomach/Bowel: Stomach is within normal limits. Appendix appears normal. No evidence of bowel wall thickening, distention, or inflammatory changes. Noninflamed diverticula are seen throughout the sigmoid colon. Vascular/Lymphatic: Moderate severity aortic calcification. No enlarged abdominal or pelvic lymph nodes. Reproductive: Uterus and bilateral adnexa are unremarkable. Other: No abdominal wall hernia or abnormality. No abdominopelvic ascites. Musculoskeletal: Multilevel degenerative changes seen throughout the lumbar spine IMPRESSION: 1. Acute anterolateral third and fourth left rib fractures. 2. Acute fracture of the proximal left clavicle. 3. Very mild bilateral lower lobe and left upper lobe atelectasis. 4. 2.5 cm x 1.6 cm heterogeneous right adrenal mass. Further evaluation with adrenal protocol CT or MRI is recommended. 5. Noninflamed sigmoid diverticulosis. Aortic Atherosclerosis (ICD10-I70.0). Electronically Signed   By: Virgina Norfolk M.D.   On: 08/22/2019 20:48   CT CERVICAL SPINE WO CONTRAST  Result Date: 08/22/2019 CLINICAL DATA:  Status post trauma. EXAM: CT CERVICAL SPINE WITHOUT CONTRAST TECHNIQUE: Multidetector CT imaging of the cervical spine was performed without intravenous contrast. Multiplanar CT image reconstructions were also generated. COMPARISON:  April 08, 2019 FINDINGS: Alignment: Normal. Skull base and vertebrae: No acute cervical spine fracture. Acute fracture of  the posterior aspect of the T2 vertebral body is seen. There is mild subsequent mass effect on the adjacent portion of the anterior spinal canal and spinal cord (axial CT image 35, CT series number 8). Soft tissues and spinal canal: Mild mass effect on the anterior aspect of the spinal canal and spinal cord at the level of the T2 vertebral body, as described above. Disc levels: Mild multilevel endplate sclerosis is seen with mild to moderate  severity multilevel intervertebral disc space narrowing. This is predominant stable in severity when compared to the prior study. Moderate severity bilateral multilevel facet joint hypertrophy is noted. Upper chest: Negative. Other: None. IMPRESSION: Acute fracture of the posterior aspect of the T2 vertebral body with mild subsequent mass effect on the adjacent portion of the anterior spinal canal and spinal cord. MRI correlation is recommended. Electronically Signed   By: Virgina Norfolk M.D.   On: 08/22/2019 20:54   CT ABDOMEN PELVIS W CONTRAST  Result Date: 08/22/2019 CLINICAL DATA:  Status post trauma. EXAM: CT CHEST, ABDOMEN, AND PELVIS WITH CONTRAST TECHNIQUE: Multidetector CT imaging of the chest, abdomen and pelvis was performed following the standard protocol during bolus administration of intravenous contrast. CONTRAST:  161mL OMNIPAQUE IOHEXOL 300 MG/ML  SOLN COMPARISON:  None. FINDINGS: CT CHEST FINDINGS Cardiovascular: There is moderate severity calcification of the aortic arch. Normal heart size. No pericardial effusion. Moderate severity coronary artery calcification is seen. Mediastinum/Nodes: No enlarged mediastinal, hilar, or axillary lymph nodes. Thyroid gland, trachea, and esophagus demonstrate no significant findings. Lungs/Pleura: Very mild atelectasis is seen within the bilateral lower lobes and left upper lobe. There is no evidence of a pleural effusion or pneumothorax. Musculoskeletal: Multiple sternal wires are present. A chronic fracture deformity is seen involving the proximal aspect of the right clavicle. Acute fracture of the proximal left clavicle is seen. Acute anterolateral third and fourth left rib fractures are seen. CT ABDOMEN PELVIS FINDINGS Hepatobiliary: No focal liver abnormality is seen. Status post cholecystectomy. No biliary dilatation. Pancreas: Unremarkable. No pancreatic ductal dilatation or surrounding inflammatory changes. Spleen: Normal in size without focal  abnormality. Adrenals/Urinary Tract: There is a 2.5 cm x 1.6 cm heterogeneous right adrenal mass. The left adrenal gland is normal in appearance. Kidneys are normal in size, without renal calculi or focal lesions. There is mild bilateral hydronephrosis. Bladder is unremarkable. Stomach/Bowel: Stomach is within normal limits. Appendix appears normal. No evidence of bowel wall thickening, distention, or inflammatory changes. Noninflamed diverticula are seen throughout the sigmoid colon. Vascular/Lymphatic: Moderate severity aortic calcification. No enlarged abdominal or pelvic lymph nodes. Reproductive: Uterus and bilateral adnexa are unremarkable. Other: No abdominal wall hernia or abnormality. No abdominopelvic ascites. Musculoskeletal: Multilevel degenerative changes seen throughout the lumbar spine IMPRESSION: 1. Acute anterolateral third and fourth left rib fractures. 2. Acute fracture of the proximal left clavicle. 3. Very mild bilateral lower lobe and left upper lobe atelectasis. 4. 2.5 cm x 1.6 cm heterogeneous right adrenal mass. Further evaluation with adrenal protocol CT or MRI is recommended. 5. Noninflamed sigmoid diverticulosis. Aortic Atherosclerosis (ICD10-I70.0). Electronically Signed   By: Virgina Norfolk M.D.   On: 08/22/2019 20:48   DG Pelvis Portable  Result Date: 08/22/2019 CLINICAL DATA:  [address] down stairs EXAM: PORTABLE PELVIS 1-2 VIEWS COMPARISON:  None. FINDINGS: There is no evidence of pelvic fracture or diastasis. No pelvic bone lesions are seen. IMPRESSION: Negative. Electronically Signed   By: Nelson Chimes M.D.   On: 08/22/2019 18:09   DG Chest [facility]  Result Date: 08/22/2019 CLINICAL DATA:  [address] down stairs. EXAM: PORTABLE CHEST 1 VIEW COMPARISON:  None. FINDINGS: Previous median sternotomy and CABG. Mild cardiomegaly. The lungs are clear. No pneumothorax or hemothorax. No acute finding seen in the region. IMPRESSION: Previous CABG. Cardiomegaly. No active disease or  traumatic finding. Electronically Signed   By: Nelson Chimes M.D.   On: 08/22/2019 18:08   CT MAXILLOFACIAL WO CONTRAST  Result Date: 08/22/2019 CLINICAL DATA:  Status post trauma. EXAM: CT HEAD WITHOUT CONTRAST CT MAXILLOFACIAL WITHOUT CONTRAST TECHNIQUE: Multidetector CT imaging of the head and maxillofacial structures were performed using the standard protocol without intravenous contrast. Multiplanar CT image reconstructions of the maxillofacial structures were also generated. COMPARISON:  None. FINDINGS: CT HEAD FINDINGS Brain: There is mild cerebral atrophy with widening of the extra-axial spaces and ventricular dilatation. There are areas of decreased attenuation within the white matter tracts of the supratentorial brain, consistent with microvascular disease changes. A 4 mm thick acute on chronic right posterior parietal subdural hemorrhage is seen. This is decreased in size when compared to the prior study. A 1.6 mm thick linear hyperdense area is seen within the extra-axial space of the right frontal lobe (axial CT image 18, CT series number 3). There is no evidence of associated mass effect or midline shift. A small area of cortical encephalomalacia, with adjacent chronic white matter low attenuation, is seen within the right frontal lobe. Stable areas of parenchymal calcification are seen along the posteromedial aspect of the right occipital lobe. Vascular: No hyperdense vessel is identified. Skull: A small burr hole is seen along the posterior aspect of the vertex on the right and lateral aspect of the vertex on the left. Other: Mild left posterior parietal scalp soft tissue swelling is seen with moderate to marked severity frontal scalp soft tissue swelling noted along the midline. An associated diffuse frontal scalp soft tissue hematoma is present. Additional mild focal areas of scalp soft tissue swelling are seen along the anterolateral and posterolateral aspect of the vertex on the left. CT  MAXILLOFACIAL FINDINGS Osseous: No fracture or mandibular dislocation. No destructive process. Orbits: Negative. No traumatic or inflammatory finding. Sinuses: Clear. Soft tissues: Mild bilateral superior paranasal soft tissue swelling is seen with moderate to marked severity frontal scalp soft tissue swelling noted along the midline. IMPRESSION: 1. 4 mm thick acute on chronic right posterior parietal subdural hemorrhage, decreased in size when compared to the prior study. 2. 1.6 mm thick linear hyperdense area is seen within the extra-axial space of the right frontal lobe which may represent a small amount of acute subdural hemorrhage. 3. Mild cerebral atrophy with widening of the extra-axial spaces and ventricular dilatation. 4. Mild left posterior parietal scalp soft tissue swelling with moderate to marked severity frontal scalp soft tissue swelling and hematoma noted along the midline. 5. Mild bilateral superior paranasal soft tissue swelling. 6. No acute facial bone fracture. Electronically Signed   By: Virgina Norfolk M.D.   On: 08/22/2019 20:37    ____________________________________________   PROCEDURES  Procedure(s) performed:   .Critical Care Performed by: Margette Fast, MD Authorized by: Margette Fast, MD   Critical care provider statement:    Critical care time (minutes):  45   Critical care time was exclusive of:  Separately billable procedures and treating other patients and teaching time   Critical care was necessary to treat or prevent imminent or life-threatening deterioration of the following conditions:  Trauma   Critical care was time spent  personally by me on the following activities:  Discussions with consultants, evaluation of patient's response to treatment, examination of patient, ordering and performing treatments and interventions, ordering and review of laboratory studies, ordering and review of radiographic studies, pulse oximetry, re-evaluation of patient's  condition, obtaining history from patient or surrogate, review of old charts, blood draw for specimens and development of treatment plan with patient or surrogate   I assumed direction of critical care for this patient from another provider in my specialty: no       ____________________________________________   INITIAL IMPRESSION / ASSESSMENT AND PLAN / ED COURSE  Pertinent labs & imaging results that were available during my care of the patient were reviewed by me and considered in my medical decision making (see chart for details).   Patient arrives to the emergency department as a level 2 trauma after fall down the stairs.  She was down on the ground for anywhere from 1 to 2 hours and found by the husband upon his return home.  She is not anticoagulated.  Plain films of the chest and pelvis reviewed at bedside.  Vital signs stable.  Patient does appear to have a impacted left wrist distal radius fracture.  09:33 PM  Have paged neurosurgery and hand surgery on call.  Patient's old charts are now merged making more of her records available to me.  She had bilateral bur holes with Dr. Venetia Constable in December for subdural hematomas.  Discussed the CT and plain film findings with the patient and husband at bedside.  Have discussed the case with Dr. Amedeo Plenty on-call for hand surgery.  He will consult on the patient in the morning and advises a sugar tong splint which has been ordered.   10:35 PM  Spoke with [facility]G and they will be in to see the patient tonight. Hold on MRI for now. No specific intervention at this time. Spoke with Dr. Mardelle Matte with Ortho who will see the patient in consultation in the AM for clavicle fx.   Discussed patient's case with Trauma Surgery, Dr. Georgette Dover to request admission. Patient and family (if present) updated with plan. Care transferred to Surgery service.  I reviewed all nursing notes, vitals, pertinent old records, EKGs, labs, imaging (as  available).  ____________________________________________  FINAL CLINICAL IMPRESSION(S) / ED DIAGNOSES  Final diagnoses:  Trauma  Injury of head, initial encounter  SDH (subdural hematoma) (H[facility])  Closed fracture of second thoracic vertebra, unspecified fracture morphology, initial encounter ([place])  Closed fracture of multiple ribs of left side, initial encounter     MEDICATIONS GIVEN DURING THIS VISIT:  Medications  iohexol (OMNIPAQUE) 300 MG/ML solution 100 mL (100 mLs Intravenous Contrast Given 08/22/19 2004)     Note:  This document was prepared using Dragon voice recognition software and may include unintentional dictation errors.  Nanda Quinton, MD, [facility]    Sourish Allender, [facility], MD 08/23/19 [phone]

## 2019-08-22 NOTE — ED Notes (Signed)
Pt transported to CT ?

## 2019-08-22 NOTE — Consult Note (Signed)
Chief Complaint   Chief Complaint  Patient presents with  . fall level 2    HPI   Consult requested by: Dr Laverta Baltimore, EDP [facility] Reason for consult: SDH, T2 fracture  HPI: Olivia Gray is a 86 y.o. female who underwent bilateral burr hole for evacuation of bilateral SDH in December 2020 by Dr Zada Finders, who presented to the ED after a fall. Patient is amnestic to event and unable to provide any details. Husband at bedside did not witness the fall, but did find her at the base of the steps. She underwent full trauma work up and was found to have multiple injuries including acute on chronic SDH, T2 fracture, rib fractures, left clavicle fracture and left wrist fracture. A NSY consultation was requested for SDH and T2 fracture. Currently patient complains of lower neck pain. She denies radiating pains into extremities. She has chronic intermittent numbness in bilateral feet, but denies any new or worsening paresthesias. She denies any weakness and LUE in sling. She takes 81mg  ASA daily.   Patient Active Problem List   Diagnosis Date Noted  . Thoracic spine fracture ([place]) 08/22/2019    PMH: Past Medical History:  Diagnosis Date  . Cancer ([place])     PSH: Past Surgical History:  Procedure Laterality Date  . BRAIN SURGERY    . CARDIAC SURGERY      (Not in a hospital admission)   SH: Social History   Tobacco Use  . Smoking status: Never Smoker  . Smokeless tobacco: Never Used  Substance Use Topics  . Alcohol use: Not Currently  . Drug use: Not Currently    MEDS: Prior to Admission medications   Medication Sig Start Date End Date Taking? Authorizing Provider  amLODipine (NORVASC) 10 MG tablet Take 10 mg by mouth daily.   Yes [provider]  aspirin 81 MG chewable tablet Chew 81 mg by mouth daily.   Yes [provider]  carvedilol (COREG) 3.125 MG tablet Take 3.125 mg by mouth 2 (two) times daily with a meal.   Yes [provider]  cholecalciferol  (VITAMIN D3) 25 MCG (1000 UNIT) tablet Take 1,000 Units by mouth daily.   Yes [provider]  Cyanocobalamin (VITAMIN B 12 PO) Take 100 mcg by mouth daily.   Yes [provider]  escitalopram (LEXAPRO) 10 MG tablet Take 10 mg by mouth daily.   Yes [provider]  ipratropium (ATROVENT) 0.03 % nasal spray Place 2 sprays into both nostrils 3 (three) times daily.   Yes [provider]  letrozole (FEMARA) 2.5 MG tablet Take 2.5 mg by mouth daily.   Yes [provider]  PRESCRIPTION MEDICATION Place 1 drop into both eyes daily. Medication for eye pressure. ( Patient don't know the name)   Yes [provider]  simvastatin (ZOCOR) 10 MG tablet Take 10 mg by mouth daily.   Yes [provider]  valsartan (DIOVAN) 320 MG tablet Take 320 mg by mouth daily.   Yes [provider]    ALLERGY: No Known Allergies  Social History   Tobacco Use  . Smoking status: Never Smoker  . Smokeless tobacco: Never Used  Substance Use Topics  . Alcohol use: Not Currently     History reviewed. No pertinent family history.   ROS   Review of Systems  Constitutional: Negative.   HENT: Negative.   Eyes: Negative for blurred vision, double vision and photophobia.  Gastrointestinal: Negative for nausea and vomiting.  Genitourinary: Negative.  Musculoskeletal: Positive for falls, joint pain, myalgias and neck pain. Negative for back pain.  Skin: Negative.   Neurological: Negative for dizziness, tingling, tremors, sensory change, speech change, focal weakness, weakness and headaches.    Exam   Vitals:   08/22/19 2200 08/22/19 2215  BP: (!) 155/57 (!) 161/78  Pulse: 84 82  Resp: (!) 24 (!) 24  Temp:    SpO2: 98% 98%   General appearance: elderly female in c collar, NAD GCS 15 Eyes: No scleral injection Cardiovascular: Regular rate and rhythm without murmurs, rubs, gallops. No edema or variciosities. Distal pulses normal. Pulmonary:  Effort normal, non-labored breathing Musculoskeletal:     Muscle tone upper extremities: Normal    Muscle tone lower extremities: Normal    Motor exam: Upper Extremities Deltoid Bicep Tricep Grip  Right 5/5 5/5 5/5 5/5  Left In sling In sling In sling Wiggles fingers   Lower Extremity IP Quad PF DF EHL  Right 5/5 5/5 5/5 5/5 5/5  Left 5/5 5/5 5/5 5/5 5/5   Neurological Mental Status:    - Patient is awake, alert, oriented to person, place, month, year, and situation    - Patient is unable to provide any details regarding the fall but able to recall everything at hospital    - No signs of aphasia or neglect Cranial Nerves    - II: Visual Fields are full. PERRL    - III/IV/VI: EOMI without ptosis or diploplia.     - V: Facial sensation is grossly normal    - VII: Facial movement is symmetric.     - VIII: hearing is intact to voice    - X: Uvula elevates symmetrically    - XI: Shoulder shrug is symmetric.    - XII: tongue is midline without atrophy or fasciculations.  Sensory: Sensation grossly intact to LT Deep Tendon Reflexes    - 2+ and symmetric in the biceps and patellae.  Plantars   - Toes are downgoing bilaterally.  Cerebellar    - FNF and HKS are intact bilaterally   Results - Imaging/Labs   Results for orders placed or performed during the hospital encounter of 08/22/19 (from the past 48 hour(s))  CBG monitoring, ED     Status: Abnormal   Collection Time: 08/22/19  5:33 PM  Result Value Ref Range   Glucose-Capillary 114 (H) 70 - 99 mg/dL    Comment: Glucose reference range applies only to samples taken after fasting for at least 8 hours. QA FLAGS AND/OR RANGES MODIFIED BY DEMOGRAPHIC UPDATE ON 04/27 AT 1753 QA FLAGS AND/OR RANGES MODIFIED BY DEMOGRAPHIC UPDATE ON 04/27 AT 1754 QA FLAGS AND/OR RANGES MODIFIED BY DEMOGRAPHIC UPDATE ON 04/27 AT 1754 QA FLAGS AND/OR RANGES MODIFIED BY DEMOGRAPHIC UPDATE ON 04/27 AT 1756   I-stat chem 8, ED (not at [facility] or [facility])      Status: Abnormal   Collection Time: 08/22/19  5:55 PM  Result Value Ref Range   Sodium 141 135 - 145 mmol/L   Potassium 4.7 3.5 - 5.1 mmol/L   Chloride 111 98 - 111 mmol/L   BUN 29 (H) 8 - 23 mg/dL   Creatinine, Ser 0.80 0.44 - 1.00 mg/dL    Comment: QA FLAGS AND/OR RANGES MODIFIED BY DEMOGRAPHIC UPDATE ON 04/27 AT 1949   Glucose, Bld 111 (H) 70 - 99 mg/dL    Comment: Glucose reference range applies only to samples taken after fasting for at least 8 hours.   Calcium, Ion 1.14 (  L) 1.15 - 1.40 mmol/L   TCO2 25 22 - 32 mmol/L   Hemoglobin 11.9 (L) 12.0 - 15.0 g/dL    Comment: QA FLAGS AND/OR RANGES MODIFIED BY DEMOGRAPHIC UPDATE ON 04/27 AT 1949   HCT 35.0 (L) 36.0 - 46.0 %    Comment: QA FLAGS AND/OR RANGES MODIFIED BY DEMOGRAPHIC UPDATE ON 04/27 AT 1949  Sample to Blood Bank     Status: None   Collection Time: 08/22/19  6:05 PM  Result Value Ref Range   Blood Bank Specimen SAMPLE AVAILABLE FOR TESTING    Sample Expiration      08/23/2019,2359 Performed at [facility], [place] [address], [place], [place] [address]   Comprehensive metabolic panel     Status: Abnormal   Collection Time: 08/22/19  6:08 PM  Result Value Ref Range   Sodium 141 135 - 145 mmol/L   Potassium 4.5 3.5 - 5.1 mmol/L   Chloride 110 98 - 111 mmol/L   CO2 24 22 - 32 mmol/L   Glucose, Bld 121 (H) 70 - 99 mg/dL    Comment: Glucose reference range applies only to samples taken after fasting for at least 8 hours.   BUN 21 8 - 23 mg/dL   Creatinine, Ser 0.92 0.44 - 1.00 mg/dL    Comment: QA FLAGS AND/OR RANGES MODIFIED BY DEMOGRAPHIC UPDATE ON 04/27 AT 1949   Calcium 9.4 8.9 - 10.3 mg/dL   Total Protein 7.1 6.5 - 8.1 g/dL   Albumin 3.7 3.5 - 5.0 g/dL   AST 24 15 - 41 U/L   ALT 20 0 - 44 U/L   Alkaline Phosphatase 78 38 - 126 U/L   Total Bilirubin 1.3 (H) 0.3 - 1.2 mg/dL   GFR calc non Af Amer NOT CALCULATED >60 mL/min   GFR calc Af Amer NOT CALCULATED >60 mL/min   Anion gap 7 5 - 15    Comment: Performed  at [facility] [address] [place], [place] [address]  CBC     Status: Abnormal   Collection Time: 08/22/19  6:08 PM  Result Value Ref Range   WBC 9.1 4.0 - 10.5 K/uL   RBC 3.91 3.87 - 5.11 MIL/uL    Comment: QA FLAGS AND/OR RANGES MODIFIED BY DEMOGRAPHIC UPDATE ON 04/27 AT 1949   Hemoglobin 11.6 (L) 12.0 - 15.0 g/dL    Comment: QA FLAGS AND/OR RANGES MODIFIED BY DEMOGRAPHIC UPDATE ON 04/27 AT 1949   HCT 36.5 36.0 - 46.0 %    Comment: QA FLAGS AND/OR RANGES MODIFIED BY DEMOGRAPHIC UPDATE ON 04/27 AT 1949   MCV 93.4 80.0 - 100.0 fL   MCH 29.7 26.0 - 34.0 pg   MCHC 31.8 30.0 - 36.0 g/dL   RDW 13.3 11.5 - 15.5 %   Platelets 159 150 - 400 K/uL   nRBC 0.0 0.0 - 0.2 %    Comment: Performed at [facility], [place] [address], [place], [place] [address]  Ethanol     Status: None   Collection Time: 08/22/19  6:08 PM  Result Value Ref Range   Alcohol, Ethyl (B) <10 <10 mg/dL    Comment: (NOTE) Lowest detectable limit for serum alcohol is 10 mg/dL. For medical purposes only. Performed at [facility], [place] [address], [place], [place] [address]   Lactic acid, plasma     Status: None   Collection Time: 08/22/19  6:08 PM  Result Value Ref Range   Lactic Acid,  Venous 1.3 0.5 - 1.9 mmol/L    Comment: Performed at [facility], [place] [address] [place], [name] [address]  Protime-INR     Status: None   Collection Time: 08/22/19  6:08 PM  Result Value Ref Range   Prothrombin Time 13.4 11.4 - 15.2 seconds   INR 1.1 0.8 - 1.2    Comment: (NOTE) INR goal varies based on device and disease states. Performed at [facility], [place] [address], [place], [place] [address]   Respiratory Panel by RT PCR (Flu A&B, Covid) - Nasopharyngeal Swab     Status: None   Collection Time: 08/22/19  6:10 PM   Specimen: Nasopharyngeal Swab  Result Value Ref Range   SARS Coronavirus 2 by RT PCR NEGATIVE NEGATIVE    Comment: (NOTE) SARS-CoV-2 target nucleic acids are NOT  DETECTED. The SARS-CoV-2 RNA is generally detectable in upper respiratoy specimens during the acute phase of infection. The lowest concentration of SARS-CoV-2 viral copies this assay can detect is 131 copies/mL. A negative result does not preclude SARS-Cov-2 infection and should not be used as the sole basis for treatment or other patient management decisions. A negative result may occur with  improper specimen collection/handling, submission of specimen other than nasopharyngeal swab, presence of viral mutation(s) within the areas targeted by this assay, and inadequate number of viral copies (<131 copies/mL). A negative result must be combined with clinical observations, patient history, and epidemiological information. The expected result is Negative. Fact Sheet for Patients:  PinkCheek.be Fact Sheet for Healthcare Providers:  GravelBags.it This test is not yet ap proved or cleared by the [place] FDA and  has been authorized for detection and/or diagnosis of SARS-CoV-2 by FDA under an Emergency Use Authorization (EUA). This EUA will remain  in effect (meaning this test can be used) for the duration of the COVID-19 declaration under Section 564(b)(1) of the Act, 21 U.S.C. section 360bbb-3(b)(1), unless the authorization is terminated or revoked sooner.    Influenza A by PCR NEGATIVE NEGATIVE   Influenza B by PCR NEGATIVE NEGATIVE    Comment: (NOTE) The Xpert Xpress SARS-CoV-2/FLU/RSV assay is intended as an aid in  the diagnosis of influenza from Nasopharyngeal swab specimens and  should not be used as a sole basis for treatment. Nasal washings and  aspirates are unacceptable for Xpert Xpress SARS-CoV-2/FLU/RSV  testing. Fact Sheet for Patients: PinkCheek.be Fact Sheet for Healthcare Providers: GravelBags.it This test is not yet approved or cleared by the  [place] FDA and  has been authorized for detection and/or diagnosis of SARS-CoV-2 by  FDA under an Emergency Use Authorization (EUA). This EUA will remain  in effect (meaning this test can be used) for the duration of the  Covid-19 declaration under Section 564(b)(1) of the Act, 21  U.S.C. section 360bbb-3(b)(1), unless the authorization is  terminated or revoked. Performed at [facility], [place] [address] [place], [place] [address]   Urinalysis, Routine w reflex microscopic     Status: Abnormal   Collection Time: 08/22/19  9:19 PM  Result Value Ref Range   Color, Urine STRAW (A) YELLOW   APPearance CLEAR CLEAR   Specific Gravity, Urine 1.024 1.005 - 1.030   pH 8.0 5.0 - 8.0   Glucose, UA NEGATIVE NEGATIVE mg/dL   Hgb urine dipstick NEGATIVE NEGATIVE   Bilirubin Urine NEGATIVE NEGATIVE   Ketones, ur 5 (A) NEGATIVE mg/dL   Protein, ur NEGATIVE NEGATIVE mg/dL   Nitrite NEGATIVE NEGATIVE  Leukocytes,Ua NEGATIVE NEGATIVE    Comment: Performed at [facility], [place] [address] [place], [place] [address]    DG Wrist Complete Left  Result Date: 08/22/2019 CLINICAL DATA:  [address] down stairs.  Wrist pain and deformity. EXAM: LEFT WRIST - COMPLETE 3+ VIEW COMPARISON:  None. FINDINGS: Colles type fractures of the ulnar styloid and distal radial metaphysis, with slight ventral angulation and dorsal tilt of the distal radial articular surface. Chronic degenerative changes affect the first carpometacarpal joint. IMPRESSION: Colles fractures of the distal radius and ulnar styloid. Electronically Signed   By: Nelson Chimes M.D.   On: 08/22/2019 18:10   CT HEAD WO CONTRAST  Result Date: 08/22/2019 CLINICAL DATA:  Status post trauma. EXAM: CT HEAD WITHOUT CONTRAST CT MAXILLOFACIAL WITHOUT CONTRAST TECHNIQUE: Multidetector CT imaging of the head and maxillofacial structures were performed using the standard protocol without intravenous contrast. Multiplanar CT image reconstructions of  the maxillofacial structures were also generated. COMPARISON:  April 11, 2019 FINDINGS: CT HEAD FINDINGS Brain: There is mild cerebral atrophy with widening of the extra-axial spaces and ventricular dilatation. There are areas of decreased attenuation within the white matter tracts of the supratentorial brain, consistent with microvascular disease changes. A 4 mm thick acute on chronic right posterior parietal subdural hemorrhage is seen. This is decreased in size when compared to the prior study. A 1.6 mm thick linear hyperdense area is seen within the extra-axial space of the right frontal lobe (axial CT image 18, CT series number 3). There is no evidence of associated mass effect or midline shift. A small area of cortical encephalomalacia, with adjacent chronic white matter low attenuation, is seen within the right frontal lobe. Stable areas of parenchymal calcification are seen along the posteromedial aspect of the right occipital lobe. Vascular: No hyperdense vessel is identified. Skull: A small burr hole is seen along the posterior aspect of the vertex on the right and lateral aspect of the vertex on the left. Other: Mild left posterior parietal scalp soft tissue swelling is seen with moderate to marked severity frontal scalp soft tissue swelling noted along the midline. An associated diffuse frontal scalp soft tissue hematoma is present. Additional mild focal areas of scalp soft tissue swelling are seen along the anterolateral and posterolateral aspect of the vertex on the left. CT MAXILLOFACIAL FINDINGS Osseous: No fracture or mandibular dislocation. No destructive process. Orbits: Negative. No traumatic or inflammatory finding. Sinuses: Clear. Soft tissues: Mild bilateral superior paranasal soft tissue swelling is seen with moderate to marked severity frontal scalp soft tissue swelling noted along the midline. IMPRESSION: 1. 4 mm thick acute on chronic right posterior parietal subdural hemorrhage,  decreased in size when compared to the prior study. 2. 1.6 mm thick linear hyperdense area is seen within the extra-axial space of the right frontal lobe which may represent a small amount of acute subdural hemorrhage. 3. Mild cerebral atrophy with widening of the extra-axial spaces and ventricular dilatation. 4. Mild left posterior parietal scalp soft tissue swelling with moderate to marked severity frontal scalp soft tissue swelling and hematoma noted along the midline. 5. Mild bilateral superior paranasal soft tissue swelling. 6. No acute facial bone fracture. Electronically Signed   By: Virgina Norfolk M.D.   On: 08/22/2019 20:35   CT CHEST W CONTRAST  Result Date: 08/22/2019 CLINICAL DATA:  Status post trauma. EXAM: CT CHEST, ABDOMEN, AND PELVIS WITH CONTRAST TECHNIQUE: Multidetector CT imaging of the chest, abdomen and pelvis was performed following the standard protocol  during bolus administration of intravenous contrast. CONTRAST:  153mL OMNIPAQUE IOHEXOL 300 MG/ML  SOLN COMPARISON:  None. FINDINGS: CT CHEST FINDINGS Cardiovascular: There is moderate severity calcification of the aortic arch. Normal heart size. No pericardial effusion. Moderate severity coronary artery calcification is seen. Mediastinum/Nodes: No enlarged mediastinal, hilar, or axillary lymph nodes. Thyroid gland, trachea, and esophagus demonstrate no significant findings. Lungs/Pleura: Very mild atelectasis is seen within the bilateral lower lobes and left upper lobe. There is no evidence of a pleural effusion or pneumothorax. Musculoskeletal: Multiple sternal wires are present. A chronic fracture deformity is seen involving the proximal aspect of the right clavicle. Acute fracture of the proximal left clavicle is seen. Acute anterolateral third and fourth left rib fractures are seen. CT ABDOMEN PELVIS FINDINGS Hepatobiliary: No focal liver abnormality is seen. Status post cholecystectomy. No biliary dilatation. Pancreas:  Unremarkable. No pancreatic ductal dilatation or surrounding inflammatory changes. Spleen: Normal in size without focal abnormality. Adrenals/Urinary Tract: There is a 2.5 cm x 1.6 cm heterogeneous right adrenal mass. The left adrenal gland is normal in appearance. Kidneys are normal in size, without renal calculi or focal lesions. There is mild bilateral hydronephrosis. Bladder is unremarkable. Stomach/Bowel: Stomach is within normal limits. Appendix appears normal. No evidence of bowel wall thickening, distention, or inflammatory changes. Noninflamed diverticula are seen throughout the sigmoid colon. Vascular/Lymphatic: Moderate severity aortic calcification. No enlarged abdominal or pelvic lymph nodes. Reproductive: Uterus and bilateral adnexa are unremarkable. Other: No abdominal wall hernia or abnormality. No abdominopelvic ascites. Musculoskeletal: Multilevel degenerative changes seen throughout the lumbar spine IMPRESSION: 1. Acute anterolateral third and fourth left rib fractures. 2. Acute fracture of the proximal left clavicle. 3. Very mild bilateral lower lobe and left upper lobe atelectasis. 4. 2.5 cm x 1.6 cm heterogeneous right adrenal mass. Further evaluation with adrenal protocol CT or MRI is recommended. 5. Noninflamed sigmoid diverticulosis. Aortic Atherosclerosis (ICD10-I70.0). Electronically Signed   By: Virgina Norfolk M.D.   On: 08/22/2019 20:48   CT CERVICAL SPINE WO CONTRAST  Result Date: 08/22/2019 CLINICAL DATA:  Status post trauma. EXAM: CT CERVICAL SPINE WITHOUT CONTRAST TECHNIQUE: Multidetector CT imaging of the cervical spine was performed without intravenous contrast. Multiplanar CT image reconstructions were also generated. COMPARISON:  April 08, 2019 FINDINGS: Alignment: Normal. Skull base and vertebrae: No acute cervical spine fracture. Acute fracture of the posterior aspect of the T2 vertebral body is seen. There is mild subsequent mass effect on the adjacent portion of the  anterior spinal canal and spinal cord (axial CT image 35, CT series number 8). Soft tissues and spinal canal: Mild mass effect on the anterior aspect of the spinal canal and spinal cord at the level of the T2 vertebral body, as described above. Disc levels: Mild multilevel endplate sclerosis is seen with mild to moderate severity multilevel intervertebral disc space narrowing. This is predominant stable in severity when compared to the prior study. Moderate severity bilateral multilevel facet joint hypertrophy is noted. Upper chest: Negative. Other: None. IMPRESSION: Acute fracture of the posterior aspect of the T2 vertebral body with mild subsequent mass effect on the adjacent portion of the anterior spinal canal and spinal cord. MRI correlation is recommended. Electronically Signed   By: Virgina Norfolk M.D.   On: 08/22/2019 20:54   CT ABDOMEN PELVIS W CONTRAST  Result Date: 08/22/2019 CLINICAL DATA:  Status post trauma. EXAM: CT CHEST, ABDOMEN, AND PELVIS WITH CONTRAST TECHNIQUE: Multidetector CT imaging of the chest, abdomen and pelvis was performed following the standard protocol  during bolus administration of intravenous contrast. CONTRAST:  161mL OMNIPAQUE IOHEXOL 300 MG/ML  SOLN COMPARISON:  None. FINDINGS: CT CHEST FINDINGS Cardiovascular: There is moderate severity calcification of the aortic arch. Normal heart size. No pericardial effusion. Moderate severity coronary artery calcification is seen. Mediastinum/Nodes: No enlarged mediastinal, hilar, or axillary lymph nodes. Thyroid gland, trachea, and esophagus demonstrate no significant findings. Lungs/Pleura: Very mild atelectasis is seen within the bilateral lower lobes and left upper lobe. There is no evidence of a pleural effusion or pneumothorax. Musculoskeletal: Multiple sternal wires are present. A chronic fracture deformity is seen involving the proximal aspect of the right clavicle. Acute fracture of the proximal left clavicle is seen. Acute  anterolateral third and fourth left rib fractures are seen. CT ABDOMEN PELVIS FINDINGS Hepatobiliary: No focal liver abnormality is seen. Status post cholecystectomy. No biliary dilatation. Pancreas: Unremarkable. No pancreatic ductal dilatation or surrounding inflammatory changes. Spleen: Normal in size without focal abnormality. Adrenals/Urinary Tract: There is a 2.5 cm x 1.6 cm heterogeneous right adrenal mass. The left adrenal gland is normal in appearance. Kidneys are normal in size, without renal calculi or focal lesions. There is mild bilateral hydronephrosis. Bladder is unremarkable. Stomach/Bowel: Stomach is within normal limits. Appendix appears normal. No evidence of bowel wall thickening, distention, or inflammatory changes. Noninflamed diverticula are seen throughout the sigmoid colon. Vascular/Lymphatic: Moderate severity aortic calcification. No enlarged abdominal or pelvic lymph nodes. Reproductive: Uterus and bilateral adnexa are unremarkable. Other: No abdominal wall hernia or abnormality. No abdominopelvic ascites. Musculoskeletal: Multilevel degenerative changes seen throughout the lumbar spine IMPRESSION: 1. Acute anterolateral third and fourth left rib fractures. 2. Acute fracture of the proximal left clavicle. 3. Very mild bilateral lower lobe and left upper lobe atelectasis. 4. 2.5 cm x 1.6 cm heterogeneous right adrenal mass. Further evaluation with adrenal protocol CT or MRI is recommended. 5. Noninflamed sigmoid diverticulosis. Aortic Atherosclerosis (ICD10-I70.0). Electronically Signed   By: Virgina Norfolk M.D.   On: 08/22/2019 20:48   DG Pelvis Portable  Result Date: 08/22/2019 CLINICAL DATA:  [address] down stairs EXAM: PORTABLE PELVIS 1-2 VIEWS COMPARISON:  None. FINDINGS: There is no evidence of pelvic fracture or diastasis. No pelvic bone lesions are seen. IMPRESSION: Negative. Electronically Signed   By: Nelson Chimes M.D.   On: 08/22/2019 18:09   DG Chest Port 1 View  Result  Date: 08/22/2019 CLINICAL DATA:  [address] down stairs. EXAM: PORTABLE CHEST 1 VIEW COMPARISON:  None. FINDINGS: Previous median sternotomy and CABG. Mild cardiomegaly. The lungs are clear. No pneumothorax or hemothorax. No acute finding seen in the region. IMPRESSION: Previous CABG. Cardiomegaly. No active disease or traumatic finding. Electronically Signed   By: Nelson Chimes M.D.   On: 08/22/2019 18:08   CT MAXILLOFACIAL WO CONTRAST  Result Date: 08/22/2019 CLINICAL DATA:  Status post trauma. EXAM: CT HEAD WITHOUT CONTRAST CT MAXILLOFACIAL WITHOUT CONTRAST TECHNIQUE: Multidetector CT imaging of the head and maxillofacial structures were performed using the standard protocol without intravenous contrast. Multiplanar CT image reconstructions of the maxillofacial structures were also generated. COMPARISON:  None. FINDINGS: CT HEAD FINDINGS Brain: There is mild cerebral atrophy with widening of the extra-axial spaces and ventricular dilatation. There are areas of decreased attenuation within the white matter tracts of the supratentorial brain, consistent with microvascular disease changes. A 4 mm thick acute on chronic right posterior parietal subdural hemorrhage is seen. This is decreased in size when compared to the prior study. A 1.6 mm thick linear hyperdense area is seen within the extra-axial  space of the right frontal lobe (axial CT image 18, CT series number 3). There is no evidence of associated mass effect or midline shift. A small area of cortical encephalomalacia, with adjacent chronic white matter low attenuation, is seen within the right frontal lobe. Stable areas of parenchymal calcification are seen along the posteromedial aspect of the right occipital lobe. Vascular: No hyperdense vessel is identified. Skull: A small burr hole is seen along the posterior aspect of the vertex on the right and lateral aspect of the vertex on the left. Other: Mild left posterior parietal scalp soft tissue swelling is seen  with moderate to marked severity frontal scalp soft tissue swelling noted along the midline. An associated diffuse frontal scalp soft tissue hematoma is present. Additional mild focal areas of scalp soft tissue swelling are seen along the anterolateral and posterolateral aspect of the vertex on the left. CT MAXILLOFACIAL FINDINGS Osseous: No fracture or mandibular dislocation. No destructive process. Orbits: Negative. No traumatic or inflammatory finding. Sinuses: Clear. Soft tissues: Mild bilateral superior paranasal soft tissue swelling is seen with moderate to marked severity frontal scalp soft tissue swelling noted along the midline. IMPRESSION: 1. 4 mm thick acute on chronic right posterior parietal subdural hemorrhage, decreased in size when compared to the prior study. 2. 1.6 mm thick linear hyperdense area is seen within the extra-axial space of the right frontal lobe which may represent a small amount of acute subdural hemorrhage. 3. Mild cerebral atrophy with widening of the extra-axial spaces and ventricular dilatation. 4. Mild left posterior parietal scalp soft tissue swelling with moderate to marked severity frontal scalp soft tissue swelling and hematoma noted along the midline. 5. Mild bilateral superior paranasal soft tissue swelling. 6. No acute facial bone fracture. Electronically Signed   By: Virgina Norfolk M.D.   On: 08/22/2019 20:37   Impression/Plan   86 y.o. female with multiple injuries after an unwitnessed fall including acute on chronic SDH, T2 fracture, rib fractures, left clavicle fracture and left wrist fracture. LUE in sling limits exam, but she is otherwise neurologically intact. She is admitted under trauma service for further management.  Acute on chronic parietal SDH, acute right frontal SDH - No MLS, no mass effect, no hydrocephalus. When compare to previous head CTs in December, CT is significant improved, although with new acute components. Regardless, no role for NS  intervention at present. - frequent neuro checks - repeat head CT tomorrow am, sooner as indicated by exam - Keppra 500mg  BID x7days for routine seizure prophylaxis - hold blood thinning medications  T2 vertebral body fracture - Some retropulsion without significant central canal stenosis - I do not see an indication for emergent MRI right now given patients neuro exam and lack of radicular symptoms.  - Aspen c collar at all times  Will let Dr Zada Finders know of patient's admission to f/u with tomorrow. Please call for any concerns.  Ferne Reus, PA-C [facility] [employer]

## 2019-08-23 ENCOUNTER — Inpatient Hospital Stay (HOSPITAL_COMMUNITY): Payer: Medicare Other

## 2019-08-23 LAB — CBC
HCT: 36.3 % (ref 36.0–46.0)
Hemoglobin: 11.9 g/dL — ABNORMAL LOW (ref 12.0–15.0)
MCH: 29.8 pg (ref 26.0–34.0)
MCHC: 32.8 g/dL (ref 30.0–36.0)
MCV: 91 fL (ref 80.0–100.0)
Platelets: 159 10*3/uL (ref 150–400)
RBC: 3.99 MIL/uL (ref 3.87–5.11)
RDW: 13.2 % (ref 11.5–15.5)
WBC: 4.7 10*3/uL (ref 4.0–10.5)
nRBC: 0 % (ref 0.0–0.2)

## 2019-08-23 LAB — BASIC METABOLIC PANEL
Anion gap: 11 (ref 5–15)
BUN: 11 mg/dL (ref 8–23)
CO2: 22 mmol/L (ref 22–32)
Calcium: 9.1 mg/dL (ref 8.9–10.3)
Chloride: 107 mmol/L (ref 98–111)
Creatinine, Ser: 0.7 mg/dL (ref 0.44–1.00)
GFR calc Af Amer: >60 mL/min (ref >60)
GFR calc non Af Amer: >60 mL/min (ref >60)
Glucose, Bld: 117 mg/dL — ABNORMAL HIGH (ref 70–99)
Potassium: 3.9 mmol/L (ref 3.5–5.1)
Sodium: 140 mmol/L (ref 135–145)

## 2019-08-23 LAB — GLUCOSE, CAPILLARY: Glucose-Capillary: 126 mg/dL — ABNORMAL HIGH (ref 70–99)

## 2019-08-23 LAB — MRSA PCR SCREENING: MRSA by PCR: NEGATIVE

## 2019-08-23 MED ORDER — ONDANSETRON HCL 4 MG/2ML IJ SOLN: 4.0000 mg | Freq: Four times a day (QID) | INTRAMUSCULAR | Status: DC | PRN

## 2019-08-23 MED ORDER — CHLORHEXIDINE GLUCONATE CLOTH 2 % EX PADS
6.0000 | MEDICATED_PAD | Freq: Every day | CUTANEOUS | Status: DC
  Administered 2019-08-23 – 2019-08-25 (×3): 6 via TOPICAL

## 2019-08-23 MED ORDER — AMLODIPINE BESYLATE 10 MG PO TABS
10.0000 mg | ORAL_TABLET | Freq: Every day | ORAL | Status: DC
  Administered 2019-08-23 – 2019-08-26 (×3): 10 mg via ORAL
  Filled 2019-08-23 (×4): qty 1

## 2019-08-23 MED ORDER — CARVEDILOL 3.125 MG PO TABS
3.1250 mg | ORAL_TABLET | Freq: Two times a day (BID) | ORAL | Status: DC
  Administered 2019-08-23: 3.125 mg via ORAL
  Filled 2019-08-23 (×4): qty 1

## 2019-08-23 MED ORDER — OXYCODONE HCL 5 MG/5ML PO SOLN: 2.5000 mg | ORAL | Status: DC | PRN

## 2019-08-23 MED ORDER — IRBESARTAN 300 MG PO TABS
300.0000 mg | ORAL_TABLET | Freq: Every day | ORAL | Status: DC
  Administered 2019-08-23 – 2019-08-26 (×3): 300 mg via ORAL
  Filled 2019-08-23 (×4): qty 1

## 2019-08-23 MED ORDER — POTASSIUM CHLORIDE IN NACL 20-0.9 MEQ/L-% IV SOLN
INTRAVENOUS | Status: DC
  Filled 2019-08-23: qty 1000

## 2019-08-23 MED ORDER — PANTOPRAZOLE SODIUM 40 MG PO TBEC: 40.0000 mg | DELAYED_RELEASE_TABLET | Freq: Every day | ORAL | Status: DC

## 2019-08-23 MED ORDER — MORPHINE SULFATE (PF) 2 MG/ML IV SOLN
2.0000 mg | INTRAVENOUS | Status: DC | PRN
  Administered 2019-08-23: 4 mg via INTRAVENOUS
  Filled 2019-08-23: qty 2

## 2019-08-23 MED ORDER — ENOXAPARIN SODIUM 40 MG/0.4ML ~~LOC~~ SOLN
30.0000 mg | Freq: Two times a day (BID) | SUBCUTANEOUS | Status: DC
  Administered 2019-08-24 – 2019-08-26 (×5): 30 mg via SUBCUTANEOUS
  Filled 2019-08-23 (×5): qty 0.4

## 2019-08-23 MED ORDER — ESCITALOPRAM OXALATE 10 MG PO TABS
10.0000 mg | ORAL_TABLET | Freq: Every day | ORAL | Status: DC
  Administered 2019-08-23 – 2019-08-26 (×4): 10 mg via ORAL
  Filled 2019-08-23 (×4): qty 1

## 2019-08-23 MED ORDER — PANTOPRAZOLE SODIUM 40 MG IV SOLR: 40.0000 mg | Freq: Every day | INTRAVENOUS | Status: DC

## 2019-08-23 MED ORDER — ACETAMINOPHEN 500 MG PO TABS
1000.0000 mg | ORAL_TABLET | Freq: Four times a day (QID) | ORAL | Status: DC
  Administered 2019-08-23 – 2019-08-24 (×4): 1000 mg via ORAL
  Filled 2019-08-23 (×5): qty 2

## 2019-08-23 MED ORDER — ONDANSETRON 4 MG PO TBDP
4.0000 mg | ORAL_TABLET | Freq: Four times a day (QID) | ORAL | Status: DC | PRN
  Filled 2019-08-23: qty 1

## 2019-08-23 MED ORDER — IPRATROPIUM BROMIDE 0.06 % NA SOLN
2.0000 | Freq: Three times a day (TID) | NASAL | Status: DC
  Administered 2019-08-23 – 2019-08-26 (×9): 2 via NASAL
  Filled 2019-08-23: qty 15

## 2019-08-23 MED ORDER — LETROZOLE 2.5 MG PO TABS
2.5000 mg | ORAL_TABLET | Freq: Every day | ORAL | Status: DC
  Administered 2019-08-25 – 2019-08-26 (×2): 2.5 mg via ORAL
  Filled 2019-08-23 (×4): qty 1

## 2019-08-23 MED ORDER — METHOCARBAMOL 500 MG PO TABS
1000.0000 mg | ORAL_TABLET | Freq: Three times a day (TID) | ORAL | Status: DC
  Administered 2019-08-23 – 2019-08-24 (×3): 1000 mg via ORAL
  Filled 2019-08-23 (×4): qty 2

## 2019-08-23 MED ORDER — SIMVASTATIN 20 MG PO TABS
10.0000 mg | ORAL_TABLET | Freq: Every day | ORAL | Status: DC
  Administered 2019-08-24 – 2019-08-26 (×3): 10 mg via ORAL
  Filled 2019-08-23 (×2): qty 1
  Filled 2019-08-23: qty 2

## 2019-08-23 MED ORDER — MORPHINE SULFATE (PF) 2 MG/ML IV SOLN: 2.0000 mg | INTRAVENOUS | Status: DC | PRN

## 2019-08-23 NOTE — ED Notes (Signed)
[name] Jenny Reichmann is heading home but would like a call the moment she is moved to a room.

## 2019-08-23 NOTE — Consult Note (Signed)
Reason for Consult: Left distal radius fracture Referring Physician: ER physician  Olivia Gray is an 86 y.o. female.  HPI: Patient is an 86 year old female with multiple medical issues.  She fell down steps sustaining significant injuries including her chest wall and spine.  I reviewed her chart in detail as well as the problem list.  I have been asked to see her in regards to the left wrist.  She has a mildly displaced left wrist fracture with some collapse.  She notes no excessive pain here.  I examined her in detail.  Past Medical History:  Diagnosis Date  . Cancer [facility])     Past Surgical History:  Procedure Laterality Date  . BRAIN SURGERY    . CARDIAC SURGERY      History reviewed. No pertinent family history.  Social History:  reports that she has never smoked. She has never used smokeless tobacco. She reports previous alcohol use. She reports previous drug use.  Allergies: No Known Allergies   Results for orders placed or performed during the hospital encounter of 08/22/19 (from the past 48 hour(s))  CBG monitoring, ED     Status: Abnormal   Collection Time: 08/22/19  5:33 PM  Result Value Ref Range   Glucose-Capillary 114 (H) 70 - 99 mg/dL    Comment: Glucose reference range applies only to samples taken after fasting for at least 8 hours. QA FLAGS AND/OR RANGES MODIFIED BY DEMOGRAPHIC UPDATE ON 04/27 AT 1753 QA FLAGS AND/OR RANGES MODIFIED BY DEMOGRAPHIC UPDATE ON 04/27 AT 1754 QA FLAGS AND/OR RANGES MODIFIED BY DEMOGRAPHIC UPDATE ON 04/27 AT 1754 QA FLAGS AND/OR RANGES MODIFIED BY DEMOGRAPHIC UPDATE ON 04/27 AT 1756   I-stat chem 8, ED (not at [facility] or [facility])     Status: Abnormal   Collection Time: 08/22/19  5:55 PM  Result Value Ref Range   Sodium 141 135 - 145 mmol/L   Potassium 4.7 3.5 - 5.1 mmol/L   Chloride 111 98 - 111 mmol/L   BUN 29 (H) 8 - 23 mg/dL   Creatinine, Ser 0.80 0.44 - 1.00 mg/dL    Comment: QA FLAGS AND/OR RANGES MODIFIED BY DEMOGRAPHIC UPDATE  ON 04/27 AT 1949   Glucose, Bld 111 (H) 70 - 99 mg/dL    Comment: Glucose reference range applies only to samples taken after fasting for at least 8 hours.   Calcium, Ion 1.14 (L) 1.15 - 1.40 mmol/L   TCO2 25 22 - 32 mmol/L   Hemoglobin 11.9 (L) 12.0 - 15.0 g/dL    Comment: QA FLAGS AND/OR RANGES MODIFIED BY DEMOGRAPHIC UPDATE ON 04/27 AT 1949   HCT 35.0 (L) 36.0 - 46.0 %    Comment: QA FLAGS AND/OR RANGES MODIFIED BY DEMOGRAPHIC UPDATE ON 04/27 AT 1949  Sample to Blood Bank     Status: None   Collection Time: 08/22/19  6:05 PM  Result Value Ref Range   Blood Bank Specimen SAMPLE AVAILABLE FOR TESTING    Sample Expiration      08/23/2019,2359 Performed at [facility], [place] [address], [place], [place] [address]   Comprehensive metabolic panel     Status: Abnormal   Collection Time: 08/22/19  6:08 PM  Result Value Ref Range   Sodium 141 135 - 145 mmol/L   Potassium 4.5 3.5 - 5.1 mmol/L   Chloride 110 98 - 111 mmol/L   CO2 24 22 - 32 mmol/L   Glucose, Bld 121 (H) 70 - 99 mg/dL  Comment: Glucose reference range applies only to samples taken after fasting for at least 8 hours.   BUN 21 8 - 23 mg/dL   Creatinine, Ser 0.92 0.44 - 1.00 mg/dL    Comment: QA FLAGS AND/OR RANGES MODIFIED BY DEMOGRAPHIC UPDATE ON 04/27 AT 1949   Calcium 9.4 8.9 - 10.3 mg/dL   Total Protein 7.1 6.5 - 8.1 g/dL   Albumin 3.7 3.5 - 5.0 g/dL   AST 24 15 - 41 U/L   ALT 20 0 - 44 U/L   Alkaline Phosphatase 78 38 - 126 U/L   Total Bilirubin 1.3 (H) 0.3 - 1.2 mg/dL   GFR calc non Af Amer NOT CALCULATED >60 mL/min   GFR calc Af Amer NOT CALCULATED >60 mL/min   Anion gap 7 5 - 15    Comment: Performed at [facility] [address] [place], [place] [address]  CBC     Status: Abnormal   Collection Time: 08/22/19  6:08 PM  Result Value Ref Range   WBC 9.1 4.0 - 10.5 K/uL   RBC 3.91 3.87 - 5.11 MIL/uL    Comment: QA FLAGS AND/OR RANGES MODIFIED BY DEMOGRAPHIC UPDATE ON 04/27 AT 1949   Hemoglobin  11.6 (L) 12.0 - 15.0 g/dL    Comment: QA FLAGS AND/OR RANGES MODIFIED BY DEMOGRAPHIC UPDATE ON 04/27 AT 1949   HCT 36.5 36.0 - 46.0 %    Comment: QA FLAGS AND/OR RANGES MODIFIED BY DEMOGRAPHIC UPDATE ON 04/27 AT 1949   MCV 93.4 80.0 - 100.0 fL   MCH 29.7 26.0 - 34.0 pg   MCHC 31.8 30.0 - 36.0 g/dL   RDW 13.3 11.5 - 15.5 %   Platelets 159 150 - 400 K/uL   nRBC 0.0 0.0 - 0.2 %    Comment: Performed at [facility], [place] [address], [place], [place] [address]  Ethanol     Status: None   Collection Time: 08/22/19  6:08 PM  Result Value Ref Range   Alcohol, Ethyl (B) <10 <10 mg/dL    Comment: (NOTE) Lowest detectable limit for serum alcohol is 10 mg/dL. For medical purposes only. Performed at [facility], [place] [address] [place], [place] [address]   Lactic acid, plasma     Status: None   Collection Time: 08/22/19  6:08 PM  Result Value Ref Range   Lactic Acid, Venous 1.3 0.5 - 1.9 mmol/L    Comment: Performed at [facility] [address], [place], [place] [address]  Protime-INR     Status: None   Collection Time: 08/22/19  6:08 PM  Result Value Ref Range   Prothrombin Time 13.4 11.4 - 15.2 seconds   INR 1.1 0.8 - 1.2    Comment: (NOTE) INR goal varies based on device and disease states. Performed at [facility], [place] [address] [place], [place] [address]   Respiratory Panel by RT PCR (Flu A&B, Covid) - Nasopharyngeal Swab     Status: None   Collection Time: 08/22/19  6:10 PM   Specimen: Nasopharyngeal Swab  Result Value Ref Range   SARS Coronavirus 2 by RT PCR NEGATIVE NEGATIVE    Comment: (NOTE) SARS-CoV-2 target nucleic acids are NOT DETECTED. The SARS-CoV-2 RNA is generally detectable in upper respiratoy specimens during the acute phase of infection. The lowest concentration of SARS-CoV-2 viral copies this assay can detect is 131 copies/mL. A negative result does not preclude SARS-Cov-2 infection and should not be used  as the sole basis for  treatment or other patient management decisions. A negative result may occur with  improper specimen collection/handling, submission of specimen other than nasopharyngeal swab, presence of viral mutation(s) within the areas targeted by this assay, and inadequate number of viral copies (<131 copies/mL). A negative result must be combined with clinical observations, patient history, and epidemiological information. The expected result is Negative. Fact Sheet for Patients:  PinkCheek.be Fact Sheet for Healthcare Providers:  GravelBags.it This test is not yet ap proved or cleared by the [place] FDA and  has been authorized for detection and/or diagnosis of SARS-CoV-2 by FDA under an Emergency Use Authorization (EUA). This EUA will remain  in effect (meaning this test can be used) for the duration of the COVID-19 declaration under Section 564(b)(1) of the Act, 21 U.S.C. section 360bbb-3(b)(1), unless the authorization is terminated or revoked sooner.    Influenza A by PCR NEGATIVE NEGATIVE   Influenza B by PCR NEGATIVE NEGATIVE    Comment: (NOTE) The Xpert Xpress SARS-CoV-2/FLU/RSV assay is intended as an aid in  the diagnosis of influenza from Nasopharyngeal swab specimens and  should not be used as a sole basis for treatment. Nasal washings and  aspirates are unacceptable for Xpert Xpress SARS-CoV-2/FLU/RSV  testing. Fact Sheet for Patients: PinkCheek.be Fact Sheet for Healthcare Providers: GravelBags.it This test is not yet approved or cleared by the [place] FDA and  has been authorized for detection and/or diagnosis of SARS-CoV-2 by  FDA under an Emergency Use Authorization (EUA). This EUA will remain  in effect (meaning this test can be used) for the duration of the  Covid-19 declaration under Section 564(b)(1) of the Act, 21  U.S.C. section  360bbb-3(b)(1), unless the authorization is  terminated or revoked. Performed at [facility], [place] [address] [place], [place] [address]   Urinalysis, Routine w reflex microscopic     Status: Abnormal   Collection Time: 08/22/19  9:19 PM  Result Value Ref Range   Color, Urine STRAW (A) YELLOW   APPearance CLEAR CLEAR   Specific Gravity, Urine 1.024 1.005 - 1.030   pH 8.0 5.0 - 8.0   Glucose, UA NEGATIVE NEGATIVE mg/dL   Hgb urine dipstick NEGATIVE NEGATIVE   Bilirubin Urine NEGATIVE NEGATIVE   Ketones, ur 5 (A) NEGATIVE mg/dL   Protein, ur NEGATIVE NEGATIVE mg/dL   Nitrite NEGATIVE NEGATIVE   Leukocytes,Ua NEGATIVE NEGATIVE    Comment: Performed at [facility] [address], [place], [place] [address]  Glucose, capillary     Status: Abnormal   Collection Time: 08/23/19  6:23 AM  Result Value Ref Range   Glucose-Capillary 126 (H) 70 - 99 mg/dL    Comment: Glucose reference range applies only to samples taken after fasting for at least 8 hours.    DG Wrist Complete Left  Result Date: 08/22/2019 CLINICAL DATA:  [address] down stairs.  Wrist pain and deformity. EXAM: LEFT WRIST - COMPLETE 3+ VIEW COMPARISON:  None. FINDINGS: Colles type fractures of the ulnar styloid and distal radial metaphysis, with slight ventral angulation and dorsal tilt of the distal radial articular surface. Chronic degenerative changes affect the first carpometacarpal joint. IMPRESSION: Colles fractures of the distal radius and ulnar styloid. Electronically Signed   By: Olivia Gray M.D.   On: 08/22/2019 18:10   CT HEAD WO CONTRAST  Result Date: 08/22/2019 CLINICAL DATA:  Status post trauma. EXAM: CT HEAD WITHOUT CONTRAST CT MAXILLOFACIAL WITHOUT CONTRAST TECHNIQUE: Multidetector CT imaging  of the head and maxillofacial structures were performed using the standard protocol without intravenous contrast. Multiplanar CT image reconstructions of the maxillofacial structures were also generated. COMPARISON:   April 11, 2019 FINDINGS: CT HEAD FINDINGS Brain: There is mild cerebral atrophy with widening of the extra-axial spaces and ventricular dilatation. There are areas of decreased attenuation within the white matter tracts of the supratentorial brain, consistent with microvascular disease changes. A 4 mm thick acute on chronic right posterior parietal subdural hemorrhage is seen. This is decreased in size when compared to the prior study. A 1.6 mm thick linear hyperdense area is seen within the extra-axial space of the right frontal lobe (axial CT image 18, CT series number 3). There is no evidence of associated mass effect or midline shift. A small area of cortical encephalomalacia, with adjacent chronic white matter low attenuation, is seen within the right frontal lobe. Stable areas of parenchymal calcification are seen along the posteromedial aspect of the right occipital lobe. Vascular: No hyperdense vessel is identified. Skull: A small burr hole is seen along the posterior aspect of the vertex on the right and lateral aspect of the vertex on the left. Other: Mild left posterior parietal scalp soft tissue swelling is seen with moderate to marked severity frontal scalp soft tissue swelling noted along the midline. An associated diffuse frontal scalp soft tissue hematoma is present. Additional mild focal areas of scalp soft tissue swelling are seen along the anterolateral and posterolateral aspect of the vertex on the left. CT MAXILLOFACIAL FINDINGS Osseous: No fracture or mandibular dislocation. No destructive process. Orbits: Negative. No traumatic or inflammatory finding. Sinuses: Clear. Soft tissues: Mild bilateral superior paranasal soft tissue swelling is seen with moderate to marked severity frontal scalp soft tissue swelling noted along the midline. IMPRESSION: 1. 4 mm thick acute on chronic right posterior parietal subdural hemorrhage, decreased in size when compared to the prior study. 2. 1.6 mm thick  linear hyperdense area is seen within the extra-axial space of the right frontal lobe which may represent a small amount of acute subdural hemorrhage. 3. Mild cerebral atrophy with widening of the extra-axial spaces and ventricular dilatation. 4. Mild left posterior parietal scalp soft tissue swelling with moderate to marked severity frontal scalp soft tissue swelling and hematoma noted along the midline. 5. Mild bilateral superior paranasal soft tissue swelling. 6. No acute facial bone fracture. Electronically Signed   By: Olivia Gray M.D.   On: 08/22/2019 20:35   CT CHEST W CONTRAST  Result Date: 08/22/2019 CLINICAL DATA:  Status post trauma. EXAM: CT CHEST, ABDOMEN, AND PELVIS WITH CONTRAST TECHNIQUE: Multidetector CT imaging of the chest, abdomen and pelvis was performed following the standard protocol during bolus administration of intravenous contrast. CONTRAST:  165mL OMNIPAQUE IOHEXOL 300 MG/ML  SOLN COMPARISON:  None. FINDINGS: CT CHEST FINDINGS Cardiovascular: There is moderate severity calcification of the aortic arch. Normal heart size. No pericardial effusion. Moderate severity coronary artery calcification is seen. Mediastinum/Nodes: No enlarged mediastinal, hilar, or axillary lymph nodes. Thyroid gland, trachea, and esophagus demonstrate no significant findings. Lungs/Pleura: Very mild atelectasis is seen within the bilateral lower lobes and left upper lobe. There is no evidence of a pleural effusion or pneumothorax. Musculoskeletal: Multiple sternal wires are present. A chronic fracture deformity is seen involving the proximal aspect of the right clavicle. Acute fracture of the proximal left clavicle is seen. Acute anterolateral third and fourth left rib fractures are seen. CT ABDOMEN PELVIS FINDINGS Hepatobiliary: No focal liver abnormality is seen.  Status post cholecystectomy. No biliary dilatation. Pancreas: Unremarkable. No pancreatic ductal dilatation or surrounding inflammatory  changes. Spleen: Normal in size without focal abnormality. Adrenals/Urinary Tract: There is a 2.5 cm x 1.6 cm heterogeneous right adrenal mass. The left adrenal gland is normal in appearance. Kidneys are normal in size, without renal calculi or focal lesions. There is mild bilateral hydronephrosis. Bladder is unremarkable. Stomach/Bowel: Stomach is within normal limits. Appendix appears normal. No evidence of bowel wall thickening, distention, or inflammatory changes. Noninflamed diverticula are seen throughout the sigmoid colon. Vascular/Lymphatic: Moderate severity aortic calcification. No enlarged abdominal or pelvic lymph nodes. Reproductive: Uterus and bilateral adnexa are unremarkable. Other: No abdominal wall hernia or abnormality. No abdominopelvic ascites. Musculoskeletal: Multilevel degenerative changes seen throughout the lumbar spine IMPRESSION: 1. Acute anterolateral third and fourth left rib fractures. 2. Acute fracture of the proximal left clavicle. 3. Very mild bilateral lower lobe and left upper lobe atelectasis. 4. 2.5 cm x 1.6 cm heterogeneous right adrenal mass. Further evaluation with adrenal protocol CT or MRI is recommended. 5. Noninflamed sigmoid diverticulosis. Aortic Atherosclerosis (ICD10-I70.0). Electronically Signed   By: Olivia Gray M.D.   On: 08/22/2019 20:48   CT CERVICAL SPINE WO CONTRAST  Result Date: 08/22/2019 CLINICAL DATA:  Status post trauma. EXAM: CT CERVICAL SPINE WITHOUT CONTRAST TECHNIQUE: Multidetector CT imaging of the cervical spine was performed without intravenous contrast. Multiplanar CT image reconstructions were also generated. COMPARISON:  April 08, 2019 FINDINGS: Alignment: Normal. Skull base and vertebrae: No acute cervical spine fracture. Acute fracture of the posterior aspect of the T2 vertebral body is seen. There is mild subsequent mass effect on the adjacent portion of the anterior spinal canal and spinal cord (axial CT image 35, CT series  number 8). Soft tissues and spinal canal: Mild mass effect on the anterior aspect of the spinal canal and spinal cord at the level of the T2 vertebral body, as described above. Disc levels: Mild multilevel endplate sclerosis is seen with mild to moderate severity multilevel intervertebral disc space narrowing. This is predominant stable in severity when compared to the prior study. Moderate severity bilateral multilevel facet joint hypertrophy is noted. Upper chest: Negative. Other: None. IMPRESSION: Acute fracture of the posterior aspect of the T2 vertebral body with mild subsequent mass effect on the adjacent portion of the anterior spinal canal and spinal cord. MRI correlation is recommended. Electronically Signed   By: Olivia Gray M.D.   On: 08/22/2019 20:54   CT ABDOMEN PELVIS W CONTRAST  Result Date: 08/22/2019 CLINICAL DATA:  Status post trauma. EXAM: CT CHEST, ABDOMEN, AND PELVIS WITH CONTRAST TECHNIQUE: Multidetector CT imaging of the chest, abdomen and pelvis was performed following the standard protocol during bolus administration of intravenous contrast. CONTRAST:  131mL OMNIPAQUE IOHEXOL 300 MG/ML  SOLN COMPARISON:  None. FINDINGS: CT CHEST FINDINGS Cardiovascular: There is moderate severity calcification of the aortic arch. Normal heart size. No pericardial effusion. Moderate severity coronary artery calcification is seen. Mediastinum/Nodes: No enlarged mediastinal, hilar, or axillary lymph nodes. Thyroid gland, trachea, and esophagus demonstrate no significant findings. Lungs/Pleura: Very mild atelectasis is seen within the bilateral lower lobes and left upper lobe. There is no evidence of a pleural effusion or pneumothorax. Musculoskeletal: Multiple sternal wires are present. A chronic fracture deformity is seen involving the proximal aspect of the right clavicle. Acute fracture of the proximal left clavicle is seen. Acute anterolateral third and fourth left rib fractures are seen. CT  ABDOMEN PELVIS FINDINGS Hepatobiliary: No focal liver abnormality is  seen. Status post cholecystectomy. No biliary dilatation. Pancreas: Unremarkable. No pancreatic ductal dilatation or surrounding inflammatory changes. Spleen: Normal in size without focal abnormality. Adrenals/Urinary Tract: There is a 2.5 cm x 1.6 cm heterogeneous right adrenal mass. The left adrenal gland is normal in appearance. Kidneys are normal in size, without renal calculi or focal lesions. There is mild bilateral hydronephrosis. Bladder is unremarkable. Stomach/Bowel: Stomach is within normal limits. Appendix appears normal. No evidence of bowel wall thickening, distention, or inflammatory changes. Noninflamed diverticula are seen throughout the sigmoid colon. Vascular/Lymphatic: Moderate severity aortic calcification. No enlarged abdominal or pelvic lymph nodes. Reproductive: Uterus and bilateral adnexa are unremarkable. Other: No abdominal wall hernia or abnormality. No abdominopelvic ascites. Musculoskeletal: Multilevel degenerative changes seen throughout the lumbar spine IMPRESSION: 1. Acute anterolateral third and fourth left rib fractures. 2. Acute fracture of the proximal left clavicle. 3. Very mild bilateral lower lobe and left upper lobe atelectasis. 4. 2.5 cm x 1.6 cm heterogeneous right adrenal mass. Further evaluation with adrenal protocol CT or MRI is recommended. 5. Noninflamed sigmoid diverticulosis. Aortic Atherosclerosis (ICD10-I70.0). Electronically Signed   By: Olivia Gray M.D.   On: 08/22/2019 20:48   DG Pelvis Portable  Result Date: 08/22/2019 CLINICAL DATA:  [address] down stairs EXAM: PORTABLE PELVIS 1-2 VIEWS COMPARISON:  None. FINDINGS: There is no evidence of pelvic fracture or diastasis. No pelvic bone lesions are seen. IMPRESSION: Negative. Electronically Signed   By: Olivia Gray M.D.   On: 08/22/2019 18:09   DG Chest Port 1 View  Result Date: 08/22/2019 CLINICAL DATA:  [address] down stairs. EXAM:  PORTABLE CHEST 1 VIEW COMPARISON:  None. FINDINGS: Previous median sternotomy and CABG. Mild cardiomegaly. The lungs are clear. No pneumothorax or hemothorax. No acute finding seen in the region. IMPRESSION: Previous CABG. Cardiomegaly. No active disease or traumatic finding. Electronically Signed   By: Olivia Gray M.D.   On: 08/22/2019 18:08   CT MAXILLOFACIAL WO CONTRAST  Result Date: 08/22/2019 CLINICAL DATA:  Status post trauma. EXAM: CT HEAD WITHOUT CONTRAST CT MAXILLOFACIAL WITHOUT CONTRAST TECHNIQUE: Multidetector CT imaging of the head and maxillofacial structures were performed using the standard protocol without intravenous contrast. Multiplanar CT image reconstructions of the maxillofacial structures were also generated. COMPARISON:  None. FINDINGS: CT HEAD FINDINGS Brain: There is mild cerebral atrophy with widening of the extra-axial spaces and ventricular dilatation. There are areas of decreased attenuation within the white matter tracts of the supratentorial brain, consistent with microvascular disease changes. A 4 mm thick acute on chronic right posterior parietal subdural hemorrhage is seen. This is decreased in size when compared to the prior study. A 1.6 mm thick linear hyperdense area is seen within the extra-axial space of the right frontal lobe (axial CT image 18, CT series number 3). There is no evidence of associated mass effect or midline shift. A small area of cortical encephalomalacia, with adjacent chronic white matter low attenuation, is seen within the right frontal lobe. Stable areas of parenchymal calcification are seen along the posteromedial aspect of the right occipital lobe. Vascular: No hyperdense vessel is identified. Skull: A small burr hole is seen along the posterior aspect of the vertex on the right and lateral aspect of the vertex on the left. Other: Mild left posterior parietal scalp soft tissue swelling is seen with moderate to marked severity frontal scalp soft  tissue swelling noted along the midline. An associated diffuse frontal scalp soft tissue hematoma is present. Additional mild focal areas of scalp soft tissue swelling  are seen along the anterolateral and posterolateral aspect of the vertex on the left. CT MAXILLOFACIAL FINDINGS Osseous: No fracture or mandibular dislocation. No destructive process. Orbits: Negative. No traumatic or inflammatory finding. Sinuses: Clear. Soft tissues: Mild bilateral superior paranasal soft tissue swelling is seen with moderate to marked severity frontal scalp soft tissue swelling noted along the midline. IMPRESSION: 1. 4 mm thick acute on chronic right posterior parietal subdural hemorrhage, decreased in size when compared to the prior study. 2. 1.6 mm thick linear hyperdense area is seen within the extra-axial space of the right frontal lobe which may represent a small amount of acute subdural hemorrhage. 3. Mild cerebral atrophy with widening of the extra-axial spaces and ventricular dilatation. 4. Mild left posterior parietal scalp soft tissue swelling with moderate to marked severity frontal scalp soft tissue swelling and hematoma noted along the midline. 5. Mild bilateral superior paranasal soft tissue swelling. 6. No acute facial bone fracture. Electronically Signed   By: Olivia Gray M.D.   On: 08/22/2019 20:37    Review of Systems Blood pressure (!) 141/63, pulse 72, temperature 97.7 F (36.5 C), temperature source Oral, resp. rate (!) 23, height 5\' 4"  (1.626 m), weight 60.5 kg, SpO2 98 %. Physical Exam Left wrist has a bandage about it.  This is holding it in nice position.  She has sensation to the fingers and good flexion extension although she has significant osteoarthritis.  Right wrist has IV access about the area and is nontender with stability about the elbow wrist and forearm.  I reviewed her x-rays which do show a mildly displaced distal radius fracture.  I would favor conservative treatment given  the other issues and problems that she is dealing with.  She has intact sensation and motor function and looks to be stable for nonsurgical care plan. Assessment/Plan: Displaced distal radius fracture left upper extremity.  Given the patient's mild displacement I would recommend a conservative approach.  I will place her in a cast in the weeks ahead and simply watch the fracture.  If she has excessive movement I would consider something more aggressive such as a surgical approach but given all if her shoes would favor a nonsurgical approach at this juncture.  We had this conversation at bedside.  All questions have been addressed.  I will be monitoring her hospital stay and will want to see her in follow-up 10 to 14 days after she is discharged of course to transition to cast as needed  Patient Active Problem List   Diagnosis Date Noted  . Thoracic spine fracture ([place]) 08/22/2019    Willa Frater III 08/23/2019, 7:49 AM

## 2019-08-23 NOTE — TOC Initial Note (Addendum)
Transition of Care [facility]) - Initial/Assessment Note    Patient Details  Name: Olivia Gray MRN: [id number] Date of Birth: Jun 24, 1932  Transition of Care [facility]) CM/SW Contact:    Emeterio Reeve, [place] Phone Number: 08/23/2019, 2:44 PM  Clinical Narrative:                  CSW met with pt at bedside. CSW introduced self and explained her role at the hospital. Pt was asleep and CSW was unable to wake. Pts husband Jenny Reichmann was present. [name] stated that PTA she was mostly independent and did not use any assistive devices. Pt was completing ADL's with very little help. [name] stated that pt is blind but tries to do most of what she needs herself. [name] stated that they live in a two story home and she constantly goes up and down the stairs which resulted in her falling.   CSW is waiting for PT/OT recommendations. CSW explained that after reccs are in [place] will help Jenny Reichmann and pt with discharge planning and anything she may need at that time.   CSW was unable to complete sbirt due to pt being a sleep and not able to wake up. CSW will attempt at later time.   CSW will continue to follow.  Expected Discharge Plan: [place] Barriers to Discharge: Continued Medical Work up   Patient Goals and CMS Choice Patient states their goals for this hospitalization and ongoing recovery are:: Pt was unable to verbalize goals CMS Medicare.gov Compare Post Acute Care list provided to:: Patient Choice offered to / list presented to : Patient, Spouse  Expected Discharge Plan and Services Expected Discharge Plan: [place]       Living arrangements for the past 2 months: Single Family Home                                      Prior Living Arrangements/Services Living arrangements for the past 2 months: Single Family Home Lives with:: Spouse Patient language and need for interpreter reviewed:: Yes Do you feel safe going back to the place where you live?: Yes      Need  for Family Participation in Patient Care: Yes (Comment) Care giver support system in place?: Yes (comment)   Criminal Activity/Legal Involvement Pertinent to Current Situation/Hospitalization: No - Comment as needed  Activities of Daily Living      Permission Sought/Granted Permission sought to share information with : Family Supports Permission granted to share information with : (unable to verbalize permission)  Share Information with NAME: Laneah Luft     Permission granted to share info w Relationship: Spouse  Permission granted to share info w Contact Information: [phone]  Emotional Assessment Appearance:: Appears stated age Attitude/Demeanor/Rapport: Unable to Assess Affect (typically observed): Unable to Assess Orientation: : Oriented to Self, Oriented to Place, Oriented to  Time, Oriented to Situation Alcohol / Substance Use: Not Applicable Psych Involvement: No (comment)  Admission diagnosis:  Trauma [T14.90XA] SDH (subdural hematoma) ([place]) [S06.5X9A] Thoracic spine fracture ([place]) [S22.009A] Injury of head, initial encounter [S09.90XA] Closed fracture of multiple ribs of left side, initial encounter [S22.42XA] Closed fracture of second thoracic vertebra, unspecified fracture morphology, initial encounter [facility]) [S22.029A] Patient Active Problem List   Diagnosis Date Noted  . Thoracic spine fracture ([place]) 08/22/2019   PCP:  Jani Gravel, MD Pharmacy:   [id number] [place], [place] -  [address] [place] [address] Phone: [phone] Fax: [phone]     Social Determinants of Health (SDOH) Interventions    Readmission Risk Interventions No flowsheet data found.  Emeterio Reeve, Latanya Presser, [place] Social Worker [phone]

## 2019-08-23 NOTE — Progress Notes (Signed)
Trauma/Critical Care Follow Up Note  Subjective:    Overnight Issues:   Objective:  Vital signs for last 24 hours: Temp:  [97.7 F (36.5 C)-98.1 F (36.7 C)] 97.7 F (36.5 C) (04/28 0808) Pulse Rate:  [51-90] 54 (04/28 1015) Resp:  [11-27] 19 (04/28 1015) BP: (123-171)/(46-82) 123/52 (04/28 1015) SpO2:  [97 %-100 %] 97 % (04/28 1015) Weight:  [60.5 kg-60.8 kg] 60.5 kg (04/28 0600)  Hemodynamic parameters for last 24 hours:    Intake/Output from previous day: 04/27 0701 - 04/28 0700 In: -  Out: 750 [Urine:750]  Intake/Output this shift: Total I/O In: 166.7 [I.V.:166.7] Out: -   Vent settings for last 24 hours:    Physical Exam:  Gen: comfortable, no distress Neuro: non-focal exam HEENT: PERRL Neck: supple, c-collar in place CV: RRR Pulm: unlabored breathing Abd: soft, NT Extr: wwp, no edema   Results for orders placed or performed during the hospital encounter of 08/22/19 (from the past 24 hour(s))  CBG monitoring, ED     Status: Abnormal   Collection Time: 08/22/19  5:33 PM  Result Value Ref Range   Glucose-Capillary 114 (H) 70 - 99 mg/dL  I-stat chem 8, ED (not at [facility] or [facility])     Status: Abnormal   Collection Time: 08/22/19  5:55 PM  Result Value Ref Range   Sodium 141 135 - 145 mmol/L   Potassium 4.7 3.5 - 5.1 mmol/L   Chloride 111 98 - 111 mmol/L   BUN 29 (H) 8 - 23 mg/dL   Creatinine, Ser 0.80 0.44 - 1.00 mg/dL   Glucose, Bld 111 (H) 70 - 99 mg/dL   Calcium, Ion 1.14 (L) 1.15 - 1.40 mmol/L   TCO2 25 22 - 32 mmol/L   Hemoglobin 11.9 (L) 12.0 - 15.0 g/dL   HCT 35.0 (L) 36.0 - 46.0 %  Sample to Blood Bank     Status: None   Collection Time: 08/22/19  6:05 PM  Result Value Ref Range   Blood Bank Specimen SAMPLE AVAILABLE FOR TESTING    Sample Expiration      08/23/2019,2359 Performed at [facility], [address], [place], [place] [address]   Comprehensive metabolic panel     Status: Abnormal   Collection Time: 08/22/19  6:08 PM    Result Value Ref Range   Sodium 141 135 - 145 mmol/L   Potassium 4.5 3.5 - 5.1 mmol/L   Chloride 110 98 - 111 mmol/L   CO2 24 22 - 32 mmol/L   Glucose, Bld 121 (H) 70 - 99 mg/dL   BUN 21 8 - 23 mg/dL   Creatinine, Ser 0.92 0.44 - 1.00 mg/dL   Calcium 9.4 8.9 - 10.3 mg/dL   Total Protein 7.1 6.5 - 8.1 g/dL   Albumin 3.7 3.5 - 5.0 g/dL   AST 24 15 - 41 U/L   ALT 20 0 - 44 U/L   Alkaline Phosphatase 78 38 - 126 U/L   Total Bilirubin 1.3 (H) 0.3 - 1.2 mg/dL   GFR calc non Af Amer NOT CALCULATED >60 mL/min   GFR calc Af Amer NOT CALCULATED >60 mL/min   Anion gap 7 5 - 15  CBC     Status: Abnormal   Collection Time: 08/22/19  6:08 PM  Result Value Ref Range   WBC 9.1 4.0 - 10.5 K/uL   RBC 3.91 3.87 - 5.11 MIL/uL   Hemoglobin 11.6 (L) 12.0 - 15.0 g/dL   HCT 36.5 36.0 -  46.0 %   MCV 93.4 80.0 - 100.0 fL   MCH 29.7 26.0 - 34.0 pg   MCHC 31.8 30.0 - 36.0 g/dL   RDW 13.3 11.5 - 15.5 %   Platelets 159 150 - 400 K/uL   nRBC 0.0 0.0 - 0.2 %  Ethanol     Status: None   Collection Time: 08/22/19  6:08 PM  Result Value Ref Range   Alcohol, Ethyl (B) <10 <10 mg/dL  Lactic acid, plasma     Status: None   Collection Time: 08/22/19  6:08 PM  Result Value Ref Range   Lactic Acid, Venous 1.3 0.5 - 1.9 mmol/L  Protime-INR     Status: None   Collection Time: 08/22/19  6:08 PM  Result Value Ref Range   Prothrombin Time 13.4 11.4 - 15.2 seconds   INR 1.1 0.8 - 1.2  Respiratory Panel by RT PCR (Flu A&B, Covid) - Nasopharyngeal Swab     Status: None   Collection Time: 08/22/19  6:10 PM   Specimen: Nasopharyngeal Swab  Result Value Ref Range   SARS Coronavirus 2 by RT PCR NEGATIVE NEGATIVE   Influenza A by PCR NEGATIVE NEGATIVE   Influenza B by PCR NEGATIVE NEGATIVE  Urinalysis, Routine w reflex microscopic     Status: Abnormal   Collection Time: 08/22/19  9:19 PM  Result Value Ref Range   Color, Urine STRAW (A) YELLOW   APPearance CLEAR CLEAR   Specific Gravity, Urine 1.024 1.005 - 1.030    pH 8.0 5.0 - 8.0   Glucose, UA NEGATIVE NEGATIVE mg/dL   Hgb urine dipstick NEGATIVE NEGATIVE   Bilirubin Urine NEGATIVE NEGATIVE   Ketones, ur 5 (A) NEGATIVE mg/dL   Protein, ur NEGATIVE NEGATIVE mg/dL   Nitrite NEGATIVE NEGATIVE   Leukocytes,Ua NEGATIVE NEGATIVE  MRSA PCR Screening     Status: None   Collection Time: 08/23/19  6:11 AM   Specimen: Nasopharyngeal  Result Value Ref Range   MRSA by PCR NEGATIVE NEGATIVE  Glucose, capillary     Status: Abnormal   Collection Time: 08/23/19  6:23 AM  Result Value Ref Range   Glucose-Capillary 126 (H) 70 - 99 mg/dL  CBC     Status: Abnormal   Collection Time: 08/23/19  7:26 AM  Result Value Ref Range   WBC 4.7 4.0 - 10.5 K/uL   RBC 3.99 3.87 - 5.11 MIL/uL   Hemoglobin 11.9 (L) 12.0 - 15.0 g/dL   HCT 36.3 36.0 - 46.0 %   MCV 91.0 80.0 - 100.0 fL   MCH 29.8 26.0 - 34.0 pg   MCHC 32.8 30.0 - 36.0 g/dL   RDW 13.2 11.5 - 15.5 %   Platelets 159 150 - 400 K/uL   nRBC 0.0 0.0 - 0.2 %  Basic metabolic panel     Status: Abnormal   Collection Time: 08/23/19  7:26 AM  Result Value Ref Range   Sodium 140 135 - 145 mmol/L   Potassium 3.9 3.5 - 5.1 mmol/L   Chloride 107 98 - 111 mmol/L   CO2 22 22 - 32 mmol/L   Glucose, Bld 117 (H) 70 - 99 mg/dL   BUN 11 8 - 23 mg/dL   Creatinine, Ser 0.70 0.44 - 1.00 mg/dL   Calcium 9.1 8.9 - 10.3 mg/dL   GFR calc non Af Amer >60 >60 mL/min   GFR calc Af Amer >60 >60 mL/min   Anion gap 11 5 - 15    Assessment & Plan:  The plan of care was discussed with the bedside nurse for the day who is in agreement with this plan and no additional concerns were raised.   Present on Admission: . Thoracic spine fracture (H[facility])    LOS: 1 day   Additional comments:I reviewed the patient's new clinical lab test results.   and I reviewed the patients new imaging test results.    Fall down stairs  Acute on chronic right posterior parietal SDH, right frontal acute SDH - NSGY c/s (Dr. Zada Finders), keppra x7d for sz  ppx, okay for DVT ppx 4/29 Scalp hematoma - monitor clinically T2 vertebral body fracture - NSGY c/s as above, okay to d/c c-collar Left anterior 3-4 rib fractures - pain control, pulm toilet/IS Left clavicle fracture - ortho c/s (Dr. Amedeo Plenty), likely non-op pending shoulder films Left wrist Colles fracture - ortho c/s as above, non-op management  FEN - regular diet, MIVF off DVT - SCDs, start LMWH 4/29 Dispo - SDU, PT/OT/SLP   Jesusita Oka, MD Trauma & General Surgery Please use [email or web address] to contact on call provider  08/23/2019  *Care during the described time interval was provided by me. I have reviewed this patient's available data, including medical history, events of note, physical examination and test results as part of my evaluation.

## 2019-08-23 NOTE — Consult Note (Signed)
Reason for Consult:Left clav fx Referring Physician: Ashanda Gray is an 86 y.o. female.  HPI: Olivia Gray fell down half a flight of stairs. She was brought to the ED where trauma workup showed multiple injuries, among which were a left clavicle and left wrist fx. Orthopedic and hand surgery were consulted. She only c/o some mild shoulder and clavicular pain.  Past Medical History:  Diagnosis Date  . Cancer [facility])     Past Surgical History:  Procedure Laterality Date  . BRAIN SURGERY    . CARDIAC SURGERY      History reviewed. No pertinent family history.  Social History:  reports that she has never smoked. She has never used smokeless tobacco. She reports previous alcohol use. She reports previous drug use.  Allergies: No Known Allergies  Medications: I have reviewed the patient's current medications.  Results for orders placed or performed during the hospital encounter of 08/22/19 (from the past 48 hour(s))  CBG monitoring, ED     Status: Abnormal   Collection Time: 08/22/19  5:33 PM  Result Value Ref Range   Glucose-Capillary 114 (H) 70 - 99 mg/dL    Comment: Glucose reference range applies only to samples taken after fasting for at least 8 hours. QA FLAGS AND/OR RANGES MODIFIED BY DEMOGRAPHIC UPDATE ON 04/27 AT 1753 QA FLAGS AND/OR RANGES MODIFIED BY DEMOGRAPHIC UPDATE ON 04/27 AT 1754 QA FLAGS AND/OR RANGES MODIFIED BY DEMOGRAPHIC UPDATE ON 04/27 AT 1754 QA FLAGS AND/OR RANGES MODIFIED BY DEMOGRAPHIC UPDATE ON 04/27 AT 1756   I-stat chem 8, ED (not at [facility] or [facility])     Status: Abnormal   Collection Time: 08/22/19  5:55 PM  Result Value Ref Range   Sodium 141 135 - 145 mmol/L   Potassium 4.7 3.5 - 5.1 mmol/L   Chloride 111 98 - 111 mmol/L   BUN 29 (H) 8 - 23 mg/dL   Creatinine, Ser 0.80 0.44 - 1.00 mg/dL    Comment: QA FLAGS AND/OR RANGES MODIFIED BY DEMOGRAPHIC UPDATE ON 04/27 AT 1949   Glucose, Bld 111 (H) 70 - 99 mg/dL    Comment: Glucose reference range applies  only to samples taken after fasting for at least 8 hours.   Calcium, Ion 1.14 (L) 1.15 - 1.40 mmol/L   TCO2 25 22 - 32 mmol/L   Hemoglobin 11.9 (L) 12.0 - 15.0 g/dL    Comment: QA FLAGS AND/OR RANGES MODIFIED BY DEMOGRAPHIC UPDATE ON 04/27 AT 1949   HCT 35.0 (L) 36.0 - 46.0 %    Comment: QA FLAGS AND/OR RANGES MODIFIED BY DEMOGRAPHIC UPDATE ON 04/27 AT 1949  Sample to Blood Bank     Status: None   Collection Time: 08/22/19  6:05 PM  Result Value Ref Range   Blood Bank Specimen SAMPLE AVAILABLE FOR TESTING    Sample Expiration      08/23/2019,2359 Performed at [facility], [place] [address], [place], [place] [address]   Comprehensive metabolic panel     Status: Abnormal   Collection Time: 08/22/19  6:08 PM  Result Value Ref Range   Sodium 141 135 - 145 mmol/L   Potassium 4.5 3.5 - 5.1 mmol/L   Chloride 110 98 - 111 mmol/L   CO2 24 22 - 32 mmol/L   Glucose, Bld 121 (H) 70 - 99 mg/dL    Comment: Glucose reference range applies only to samples taken after fasting for at least 8 hours.   BUN 21 8 - 23 mg/dL  Creatinine, Ser 0.92 0.44 - 1.00 mg/dL    Comment: QA FLAGS AND/OR RANGES MODIFIED BY DEMOGRAPHIC UPDATE ON 04/27 AT 1949   Calcium 9.4 8.9 - 10.3 mg/dL   Total Protein 7.1 6.5 - 8.1 g/dL   Albumin 3.7 3.5 - 5.0 g/dL   AST 24 15 - 41 U/L   ALT 20 0 - 44 U/L   Alkaline Phosphatase 78 38 - 126 U/L   Total Bilirubin 1.3 (H) 0.3 - 1.2 mg/dL   GFR calc non Af Amer NOT CALCULATED >60 mL/min   GFR calc Af Amer NOT CALCULATED >60 mL/min   Anion gap 7 5 - 15    Comment: Performed at [facility] [address], [place], [place] [address]  CBC     Status: Abnormal   Collection Time: 08/22/19  6:08 PM  Result Value Ref Range   WBC 9.1 4.0 - 10.5 K/uL   RBC 3.91 3.87 - 5.11 MIL/uL    Comment: QA FLAGS AND/OR RANGES MODIFIED BY DEMOGRAPHIC UPDATE ON 04/27 AT 1949   Hemoglobin 11.6 (L) 12.0 - 15.0 g/dL    Comment: QA FLAGS AND/OR RANGES MODIFIED BY DEMOGRAPHIC UPDATE ON  04/27 AT 1949   HCT 36.5 36.0 - 46.0 %    Comment: QA FLAGS AND/OR RANGES MODIFIED BY DEMOGRAPHIC UPDATE ON 04/27 AT 1949   MCV 93.4 80.0 - 100.0 fL   MCH 29.7 26.0 - 34.0 pg   MCHC 31.8 30.0 - 36.0 g/dL   RDW 13.3 11.5 - 15.5 %   Platelets 159 150 - 400 K/uL   nRBC 0.0 0.0 - 0.2 %    Comment: Performed at [facility], [place] [address] [place], [place] [address]  Ethanol     Status: None   Collection Time: 08/22/19  6:08 PM  Result Value Ref Range   Alcohol, Ethyl (B) <10 <10 mg/dL    Comment: (NOTE) Lowest detectable limit for serum alcohol is 10 mg/dL. For medical purposes only. Performed at [facility], [place] [address], [place], [place] [address]   Lactic acid, plasma     Status: None   Collection Time: 08/22/19  6:08 PM  Result Value Ref Range   Lactic Acid, Venous 1.3 0.5 - 1.9 mmol/L    Comment: Performed at [facility] [address] [place], [place] [address]  Protime-INR     Status: None   Collection Time: 08/22/19  6:08 PM  Result Value Ref Range   Prothrombin Time 13.4 11.4 - 15.2 seconds   INR 1.1 0.8 - 1.2    Comment: (NOTE) INR goal varies based on device and disease states. Performed at [facility], [place] [address], [place],  [address]   Respiratory Panel by RT PCR (Flu A&B, Covid) - Nasopharyngeal Swab     Status: None   Collection Time: 08/22/19  6:10 PM   Specimen: Nasopharyngeal Swab  Result Value Ref Range   SARS Coronavirus 2 by RT PCR NEGATIVE NEGATIVE    Comment: (NOTE) SARS-CoV-2 target nucleic acids are NOT DETECTED. The SARS-CoV-2 RNA is generally detectable in upper respiratoy specimens during the acute phase of infection. The lowest concentration of SARS-CoV-2 viral copies this assay can detect is 131 copies/mL. A negative result does not preclude SARS-Cov-2 infection and should not be used as the sole basis for treatment or other patient management decisions. A negative result may occur with  improper  specimen collection/handling, submission of specimen other  than nasopharyngeal swab, presence of viral mutation(s) within the areas targeted by this assay, and inadequate number of viral copies (<131 copies/mL). A negative result must be combined with clinical observations, patient history, and epidemiological information. The expected result is Negative. Fact Sheet for Patients:  PinkCheek.be Fact Sheet for Healthcare Providers:  GravelBags.it This test is not yet ap proved or cleared by the [place] FDA and  has been authorized for detection and/or diagnosis of SARS-CoV-2 by FDA under an Emergency Use Authorization (EUA). This EUA will remain  in effect (meaning this test can be used) for the duration of the COVID-19 declaration under Section 564(b)(1) of the Act, 21 U.S.C. section 360bbb-3(b)(1), unless the authorization is terminated or revoked sooner.    Influenza A by PCR NEGATIVE NEGATIVE   Influenza B by PCR NEGATIVE NEGATIVE    Comment: (NOTE) The Xpert Xpress SARS-CoV-2/FLU/RSV assay is intended as an aid in  the diagnosis of influenza from Nasopharyngeal swab specimens and  should not be used as a sole basis for treatment. Nasal washings and  aspirates are unacceptable for Xpert Xpress SARS-CoV-2/FLU/RSV  testing. Fact Sheet for Patients: PinkCheek.be Fact Sheet for Healthcare Providers: GravelBags.it This test is not yet approved or cleared by the [place] FDA and  has been authorized for detection and/or diagnosis of SARS-CoV-2 by  FDA under an Emergency Use Authorization (EUA). This EUA will remain  in effect (meaning this test can be used) for the duration of the  Covid-19 declaration under Section 564(b)(1) of the Act, 21  U.S.C. section 360bbb-3(b)(1), unless the authorization is  terminated or revoked. Performed at [facility],  [place] [address] [place], [place] [address]   Urinalysis, Routine w reflex microscopic     Status: Abnormal   Collection Time: 08/22/19  9:19 PM  Result Value Ref Range   Color, Urine STRAW (A) YELLOW   APPearance CLEAR CLEAR   Specific Gravity, Urine 1.024 1.005 - 1.030   pH 8.0 5.0 - 8.0   Glucose, UA NEGATIVE NEGATIVE mg/dL   Hgb urine dipstick NEGATIVE NEGATIVE   Bilirubin Urine NEGATIVE NEGATIVE   Ketones, ur 5 (A) NEGATIVE mg/dL   Protein, ur NEGATIVE NEGATIVE mg/dL   Nitrite NEGATIVE NEGATIVE   Leukocytes,Ua NEGATIVE NEGATIVE    Comment: Performed at [facility] [address] [place], [place] [address]  MRSA PCR Screening     Status: None   Collection Time: 08/23/19  6:11 AM   Specimen: Nasopharyngeal  Result Value Ref Range   MRSA by PCR NEGATIVE NEGATIVE    Comment:        The GeneXpert MRSA Assay (FDA approved for NASAL specimens only), is one component of a comprehensive MRSA colonization surveillance program. It is not intended to diagnose MRSA infection nor to guide or monitor treatment for MRSA infections. Performed at [facility], [place] [address] [place], [place] [address]   Glucose, capillary     Status: Abnormal   Collection Time: 08/23/19  6:23 AM  Result Value Ref Range   Glucose-Capillary 126 (H) 70 - 99 mg/dL    Comment: Glucose reference range applies only to samples taken after fasting for at least 8 hours.  CBC     Status: Abnormal   Collection Time: 08/23/19  7:26 AM  Result Value Ref Range   WBC 4.7 4.0 - 10.5 K/uL   RBC 3.99 3.87 - 5.11 MIL/uL   Hemoglobin 11.9 (L) 12.0 - 15.0 g/dL  HCT 36.3 36.0 - 46.0 %   MCV 91.0 80.0 - 100.0 fL   MCH 29.8 26.0 - 34.0 pg   MCHC 32.8 30.0 - 36.0 g/dL   RDW 13.2 11.5 - 15.5 %   Platelets 159 150 - 400 K/uL   nRBC 0.0 0.0 - 0.2 %    Comment: Performed at [facility], [place] [address], [place], [place] [id number]  Basic metabolic panel     Status: Abnormal   Collection Time: 08/23/19   7:26 AM  Result Value Ref Range   Sodium 140 135 - 145 mmol/L   Potassium 3.9 3.5 - 5.1 mmol/L   Chloride 107 98 - 111 mmol/L   CO2 22 22 - 32 mmol/L   Glucose, Bld 117 (H) 70 - 99 mg/dL    Comment: Glucose reference range applies only to samples taken after fasting for at least 8 hours.   BUN 11 8 - 23 mg/dL   Creatinine, Ser 0.70 0.44 - 1.00 mg/dL   Calcium 9.1 8.9 - 10.3 mg/dL   GFR calc non Af Amer >60 >60 mL/min   GFR calc Af Amer >60 >60 mL/min   Anion gap 11 5 - 15    Comment: Performed at [facility] [address], [place], [place] [address]    DG Clavicle Left  Result Date: 08/23/2019 CLINICAL DATA:  Injury EXAM: LEFT CLAVICLE - 2+ VIEWS COMPARISON:  Chest CT August 22, 2019 FINDINGS: Frontal and angled frontal views obtained. The fracture of the proximal left clavicle noted on CT is less well appreciable by radiography. The medial left clavicle is not well seen but appears to be displaced somewhat superiorly with respect to the remainder of the left clavicle. No other fracture evident. There is acromioclavicular separation on the left. Noah vert dislocation evident. There is osteoarthritic change in the left glenohumeral joint. Bones are osteoporotic. Visualized left lung clear. IMPRESSION: The fracture of the medial left clavicle is not well visualized by radiography. It is better seen on CT. There appears to be superior displacement of the medial left clavicle with respect to the remainder of the bone. No other fracture evident. There is acromioclavicular separation. No frank dislocation evident. Osteoarthritic change noted in the left glenohumeral joint. Underlying osteoporosis noted. Electronically Signed   By: Lowella Grip III M.D.   On: 08/23/2019 10:45   DG Wrist Complete Left  Result Date: 08/22/2019 CLINICAL DATA:  [address] down stairs.  Wrist pain and deformity. EXAM: LEFT WRIST - COMPLETE 3+ VIEW COMPARISON:  None. FINDINGS: Colles type fractures of the ulnar  styloid and distal radial metaphysis, with slight ventral angulation and dorsal tilt of the distal radial articular surface. Chronic degenerative changes affect the first carpometacarpal joint. IMPRESSION: Colles fractures of the distal radius and ulnar styloid. Electronically Signed   By: Nelson Chimes M.D.   On: 08/22/2019 18:10   CT HEAD WO CONTRAST  Result Date: 08/23/2019 CLINICAL DATA:  Follow-up subdural hemorrhage EXAM: CT HEAD WITHOUT CONTRAST TECHNIQUE: Contiguous axial images were obtained from the base of the skull through the vertex without intravenous contrast. COMPARISON:  Yesterday FINDINGS: Brain: Encephalomalacia at the anterior right insula and frontal operculum. Mixed density subdural collection on the right . Maximal thickness is at the low-density component and is 4 mm, mildly decreased. No left-sided collection is seen. Small volume subarachnoid hemorrhage at the right ambient cistern. No acute infarct or hydrocephalus. Vascular: No hyperdense vessel or unexpected calcification. Skull: More generalized scalp  swelling. Negative for fracture. Bilateral parietal burr hole. Sinuses/Orbits: Negative IMPRESSION: 1. Slight decrease in the thin subdural hematoma on the right. 2. Minimal subarachnoid hemorrhage at the right ambient cistern, non progressed Electronically Signed   By: Monte Fantasia M.D.   On: 08/23/2019 10:20   CT HEAD WO CONTRAST  Result Date: 08/22/2019 CLINICAL DATA:  Status post trauma. EXAM: CT HEAD WITHOUT CONTRAST CT MAXILLOFACIAL WITHOUT CONTRAST TECHNIQUE: Multidetector CT imaging of the head and maxillofacial structures were performed using the standard protocol without intravenous contrast. Multiplanar CT image reconstructions of the maxillofacial structures were also generated. COMPARISON:  April 11, 2019 FINDINGS: CT HEAD FINDINGS Brain: There is mild cerebral atrophy with widening of the extra-axial spaces and ventricular dilatation. There are areas of decreased  attenuation within the white matter tracts of the supratentorial brain, consistent with microvascular disease changes. A 4 mm thick acute on chronic right posterior parietal subdural hemorrhage is seen. This is decreased in size when compared to the prior study. A 1.6 mm thick linear hyperdense area is seen within the extra-axial space of the right frontal lobe (axial CT image 18, CT series number 3). There is no evidence of associated mass effect or midline shift. A small area of cortical encephalomalacia, with adjacent chronic white matter low attenuation, is seen within the right frontal lobe. Stable areas of parenchymal calcification are seen along the posteromedial aspect of the right occipital lobe. Vascular: No hyperdense vessel is identified. Skull: A small burr hole is seen along the posterior aspect of the vertex on the right and lateral aspect of the vertex on the left. Other: Mild left posterior parietal scalp soft tissue swelling is seen with moderate to marked severity frontal scalp soft tissue swelling noted along the midline. An associated diffuse frontal scalp soft tissue hematoma is present. Additional mild focal areas of scalp soft tissue swelling are seen along the anterolateral and posterolateral aspect of the vertex on the left. CT MAXILLOFACIAL FINDINGS Osseous: No fracture or mandibular dislocation. No destructive process. Orbits: Negative. No traumatic or inflammatory finding. Sinuses: Clear. Soft tissues: Mild bilateral superior paranasal soft tissue swelling is seen with moderate to marked severity frontal scalp soft tissue swelling noted along the midline. IMPRESSION: 1. 4 mm thick acute on chronic right posterior parietal subdural hemorrhage, decreased in size when compared to the prior study. 2. 1.6 mm thick linear hyperdense area is seen within the extra-axial space of the right frontal lobe which may represent a small amount of acute subdural hemorrhage. 3. Mild cerebral atrophy with  widening of the extra-axial spaces and ventricular dilatation. 4. Mild left posterior parietal scalp soft tissue swelling with moderate to marked severity frontal scalp soft tissue swelling and hematoma noted along the midline. 5. Mild bilateral superior paranasal soft tissue swelling. 6. No acute facial bone fracture. Electronically Signed   By: Virgina Norfolk M.D.   On: 08/22/2019 20:35   CT CHEST W CONTRAST  Result Date: 08/22/2019 CLINICAL DATA:  Status post trauma. EXAM: CT CHEST, ABDOMEN, AND PELVIS WITH CONTRAST TECHNIQUE: Multidetector CT imaging of the chest, abdomen and pelvis was performed following the standard protocol during bolus administration of intravenous contrast. CONTRAST:  160mL OMNIPAQUE IOHEXOL 300 MG/ML  SOLN COMPARISON:  None. FINDINGS: CT CHEST FINDINGS Cardiovascular: There is moderate severity calcification of the aortic arch. Normal heart size. No pericardial effusion. Moderate severity coronary artery calcification is seen. Mediastinum/Nodes: No enlarged mediastinal, hilar, or axillary lymph nodes. Thyroid gland, trachea, and esophagus demonstrate no significant  findings. Lungs/Pleura: Very mild atelectasis is seen within the bilateral lower lobes and left upper lobe. There is no evidence of a pleural effusion or pneumothorax. Musculoskeletal: Multiple sternal wires are present. A chronic fracture deformity is seen involving the proximal aspect of the right clavicle. Acute fracture of the proximal left clavicle is seen. Acute anterolateral third and fourth left rib fractures are seen. CT ABDOMEN PELVIS FINDINGS Hepatobiliary: No focal liver abnormality is seen. Status post cholecystectomy. No biliary dilatation. Pancreas: Unremarkable. No pancreatic ductal dilatation or surrounding inflammatory changes. Spleen: Normal in size without focal abnormality. Adrenals/Urinary Tract: There is a 2.5 cm x 1.6 cm heterogeneous right adrenal mass. The left adrenal gland is normal in  appearance. Kidneys are normal in size, without renal calculi or focal lesions. There is mild bilateral hydronephrosis. Bladder is unremarkable. Stomach/Bowel: Stomach is within normal limits. Appendix appears normal. No evidence of bowel wall thickening, distention, or inflammatory changes. Noninflamed diverticula are seen throughout the sigmoid colon. Vascular/Lymphatic: Moderate severity aortic calcification. No enlarged abdominal or pelvic lymph nodes. Reproductive: Uterus and bilateral adnexa are unremarkable. Other: No abdominal wall hernia or abnormality. No abdominopelvic ascites. Musculoskeletal: Multilevel degenerative changes seen throughout the lumbar spine IMPRESSION: 1. Acute anterolateral third and fourth left rib fractures. 2. Acute fracture of the proximal left clavicle. 3. Very mild bilateral lower lobe and left upper lobe atelectasis. 4. 2.5 cm x 1.6 cm heterogeneous right adrenal mass. Further evaluation with adrenal protocol CT or MRI is recommended. 5. Noninflamed sigmoid diverticulosis. Aortic Atherosclerosis (ICD10-I70.0). Electronically Signed   By: Virgina Norfolk M.D.   On: 08/22/2019 20:48   CT CERVICAL SPINE WO CONTRAST  Result Date: 08/22/2019 CLINICAL DATA:  Status post trauma. EXAM: CT CERVICAL SPINE WITHOUT CONTRAST TECHNIQUE: Multidetector CT imaging of the cervical spine was performed without intravenous contrast. Multiplanar CT image reconstructions were also generated. COMPARISON:  April 08, 2019 FINDINGS: Alignment: Normal. Skull base and vertebrae: No acute cervical spine fracture. Acute fracture of the posterior aspect of the T2 vertebral body is seen. There is mild subsequent mass effect on the adjacent portion of the anterior spinal canal and spinal cord (axial CT image 35, CT series number 8). Soft tissues and spinal canal: Mild mass effect on the anterior aspect of the spinal canal and spinal cord at the level of the T2 vertebral body, as described above. Disc  levels: Mild multilevel endplate sclerosis is seen with mild to moderate severity multilevel intervertebral disc space narrowing. This is predominant stable in severity when compared to the prior study. Moderate severity bilateral multilevel facet joint hypertrophy is noted. Upper chest: Negative. Other: None. IMPRESSION: Acute fracture of the posterior aspect of the T2 vertebral body with mild subsequent mass effect on the adjacent portion of the anterior spinal canal and spinal cord. MRI correlation is recommended. Electronically Signed   By: Virgina Norfolk M.D.   On: 08/22/2019 20:54   CT ABDOMEN PELVIS W CONTRAST  Result Date: 08/22/2019 CLINICAL DATA:  Status post trauma. EXAM: CT CHEST, ABDOMEN, AND PELVIS WITH CONTRAST TECHNIQUE: Multidetector CT imaging of the chest, abdomen and pelvis was performed following the standard protocol during bolus administration of intravenous contrast. CONTRAST:  183mL OMNIPAQUE IOHEXOL 300 MG/ML  SOLN COMPARISON:  None. FINDINGS: CT CHEST FINDINGS Cardiovascular: There is moderate severity calcification of the aortic arch. Normal heart size. No pericardial effusion. Moderate severity coronary artery calcification is seen. Mediastinum/Nodes: No enlarged mediastinal, hilar, or axillary lymph nodes. Thyroid gland, trachea, and esophagus demonstrate no significant  findings. Lungs/Pleura: Very mild atelectasis is seen within the bilateral lower lobes and left upper lobe. There is no evidence of a pleural effusion or pneumothorax. Musculoskeletal: Multiple sternal wires are present. A chronic fracture deformity is seen involving the proximal aspect of the right clavicle. Acute fracture of the proximal left clavicle is seen. Acute anterolateral third and fourth left rib fractures are seen. CT ABDOMEN PELVIS FINDINGS Hepatobiliary: No focal liver abnormality is seen. Status post cholecystectomy. No biliary dilatation. Pancreas: Unremarkable. No pancreatic ductal dilatation or  surrounding inflammatory changes. Spleen: Normal in size without focal abnormality. Adrenals/Urinary Tract: There is a 2.5 cm x 1.6 cm heterogeneous right adrenal mass. The left adrenal gland is normal in appearance. Kidneys are normal in size, without renal calculi or focal lesions. There is mild bilateral hydronephrosis. Bladder is unremarkable. Stomach/Bowel: Stomach is within normal limits. Appendix appears normal. No evidence of bowel wall thickening, distention, or inflammatory changes. Noninflamed diverticula are seen throughout the sigmoid colon. Vascular/Lymphatic: Moderate severity aortic calcification. No enlarged abdominal or pelvic lymph nodes. Reproductive: Uterus and bilateral adnexa are unremarkable. Other: No abdominal wall hernia or abnormality. No abdominopelvic ascites. Musculoskeletal: Multilevel degenerative changes seen throughout the lumbar spine IMPRESSION: 1. Acute anterolateral third and fourth left rib fractures. 2. Acute fracture of the proximal left clavicle. 3. Very mild bilateral lower lobe and left upper lobe atelectasis. 4. 2.5 cm x 1.6 cm heterogeneous right adrenal mass. Further evaluation with adrenal protocol CT or MRI is recommended. 5. Noninflamed sigmoid diverticulosis. Aortic Atherosclerosis (ICD10-I70.0). Electronically Signed   By: Virgina Norfolk M.D.   On: 08/22/2019 20:48   DG Pelvis Portable  Result Date: 08/22/2019 CLINICAL DATA:  [address] down stairs EXAM: PORTABLE PELVIS 1-2 VIEWS COMPARISON:  None. FINDINGS: There is no evidence of pelvic fracture or diastasis. No pelvic bone lesions are seen. IMPRESSION: Negative. Electronically Signed   By: Nelson Chimes M.D.   On: 08/22/2019 18:09   DG Chest Port 1 View  Result Date: 08/22/2019 CLINICAL DATA:  [address] down stairs. EXAM: PORTABLE CHEST 1 VIEW COMPARISON:  None. FINDINGS: Previous median sternotomy and CABG. Mild cardiomegaly. The lungs are clear. No pneumothorax or hemothorax. No acute finding seen in the  region. IMPRESSION: Previous CABG. Cardiomegaly. No active disease or traumatic finding. Electronically Signed   By: Nelson Chimes M.D.   On: 08/22/2019 18:08   CT MAXILLOFACIAL WO CONTRAST  Result Date: 08/22/2019 CLINICAL DATA:  Status post trauma. EXAM: CT HEAD WITHOUT CONTRAST CT MAXILLOFACIAL WITHOUT CONTRAST TECHNIQUE: Multidetector CT imaging of the head and maxillofacial structures were performed using the standard protocol without intravenous contrast. Multiplanar CT image reconstructions of the maxillofacial structures were also generated. COMPARISON:  None. FINDINGS: CT HEAD FINDINGS Brain: There is mild cerebral atrophy with widening of the extra-axial spaces and ventricular dilatation. There are areas of decreased attenuation within the white matter tracts of the supratentorial brain, consistent with microvascular disease changes. A 4 mm thick acute on chronic right posterior parietal subdural hemorrhage is seen. This is decreased in size when compared to the prior study. A 1.6 mm thick linear hyperdense area is seen within the extra-axial space of the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

## 2019-08-23 NOTE — ED Notes (Signed)
Admitting paged to [facility] per her request
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
## 2019-08-23 NOTE — ED Notes (Signed)
Attempted report x1. 

## 2019-08-24 ENCOUNTER — Encounter (HOSPITAL_COMMUNITY): Payer: Self-pay

## 2019-08-24 DIAGNOSIS — R001 Bradycardia, unspecified: Secondary | ICD-10-CM | POA: Diagnosis not present

## 2019-08-24 DIAGNOSIS — R55 Syncope and collapse: Secondary | ICD-10-CM | POA: Diagnosis not present

## 2019-08-24 LAB — MAGNESIUM: Magnesium: 2 mg/dL (ref 1.7–2.4)

## 2019-08-24 LAB — CBC
HCT: 32.4 % — ABNORMAL LOW (ref 36.0–46.0)
Hemoglobin: 10.6 g/dL — ABNORMAL LOW (ref 12.0–15.0)
MCH: 29.7 pg (ref 26.0–34.0)
MCHC: 32.7 g/dL (ref 30.0–36.0)
MCV: 90.8 fL (ref 80.0–100.0)
Platelets: 149 10*3/uL — ABNORMAL LOW (ref 150–400)
RBC: 3.57 MIL/uL — ABNORMAL LOW (ref 3.87–5.11)
RDW: 13.4 % (ref 11.5–15.5)
WBC: 4.1 10*3/uL (ref 4.0–10.5)
nRBC: 0 % (ref 0.0–0.2)

## 2019-08-24 LAB — BASIC METABOLIC PANEL
Anion gap: 9 (ref 5–15)
BUN: 15 mg/dL (ref 8–23)
CO2: 23 mmol/L (ref 22–32)
Calcium: 8.6 mg/dL — ABNORMAL LOW (ref 8.9–10.3)
Chloride: 108 mmol/L (ref 98–111)
Creatinine, Ser: 0.92 mg/dL (ref 0.44–1.00)
GFR calc Af Amer: >60 mL/min (ref >60)
GFR calc non Af Amer: 57 mL/min — ABNORMAL LOW (ref >60)
Glucose, Bld: 106 mg/dL — ABNORMAL HIGH (ref 70–99)
Potassium: 3.7 mmol/L (ref 3.5–5.1)
Sodium: 140 mmol/L (ref 135–145)

## 2019-08-24 LAB — PHOSPHORUS: Phosphorus: 3.2 mg/dL (ref 2.5–4.6)

## 2019-08-24 MED ORDER — BOOST / RESOURCE BREEZE PO LIQD CUSTOM
1.0000 | Freq: Two times a day (BID) | ORAL | Status: DC
  Administered 2019-08-24 – 2019-08-25 (×3): 1 via ORAL

## 2019-08-24 MED ORDER — ACETAMINOPHEN 325 MG PO TABS
650.0000 mg | ORAL_TABLET | Freq: Four times a day (QID) | ORAL | Status: DC
  Administered 2019-08-24 – 2019-08-26 (×9): 650 mg via ORAL
  Filled 2019-08-24 (×8): qty 2

## 2019-08-24 MED ORDER — METHOCARBAMOL 500 MG PO TABS
1000.0000 mg | ORAL_TABLET | Freq: Three times a day (TID) | ORAL | Status: DC | PRN
  Administered 2019-08-24 (×2): 1000 mg via ORAL
  Filled 2019-08-24: qty 2

## 2019-08-24 MED ORDER — OXYCODONE HCL 5 MG PO TABS
2.5000 mg | ORAL_TABLET | ORAL | Status: DC | PRN
  Administered 2019-08-24: 5 mg via ORAL
  Filled 2019-08-24: qty 1

## 2019-08-24 MED ORDER — MORPHINE SULFATE (PF) 2 MG/ML IV SOLN: 2.0000 mg | INTRAVENOUS | Status: DC | PRN

## 2019-08-24 NOTE — Progress Notes (Signed)
PT Cancellation Note  Patient Details Name: Olivia Gray MRN: [id number] DOB: 04-21-1933   Cancelled Treatment:    Reason Eval/Treat Not Completed: Medical issues which prohibited therapy. Pt undergoing EKG, per PA hold at this time until EKG is read. Pt with labile HR this morning and last night, generally feeling unwell per PA note.   Zenaida Niece 08/24/2019, 1:00 PM

## 2019-08-24 NOTE — Progress Notes (Signed)
OT Cancellation Note  Patient Details Name: Olivia Gray MRN: [id number] DOB: Jan 07, 1933   Cancelled Treatment:    Reason Eval/Treat Not Completed: Medical issues which prohibited therapy(Per PA, pt getting EKG and presenting with elevate HR. Will return as schedule allows.)  [place], OTR/L Acute Rehab Pager: [phone] Office: [phone] 08/24/2019, 9:57 AM

## 2019-08-24 NOTE — Consult Note (Addendum)
Cardiology Consultation:   Patient ID: Olivia Gray MRN: [id number]; DOB: 12/08/1932  Admit date: 08/22/2019 Date of Consult: 08/24/2019  Primary Care Provider: Jani Gravel, MD Primary Cardiologist: Glenetta Hew, MD  Primary Electrophysiologist:  None    Patient Profile:   Olivia Gray is a 86 y.o. female with a history of CAD s/p CABG x4 in 2004, subdural hematoma in 03/2019 s/p burr hole evaluation, hypertension, hyperlipidemia, pre-diabetes, blindness, and breast cancer who is being seen today for the evaluation of bradycardia at the request of [employer], PA-C.  History of Present Illness:   Ms. Olivia Gray is a 86 year old female with the above history. History of CAD with remote CABG x4 in 2004. Last Myoview in 2016 was low risk with no evidence of ischemia. Patient was last seen by Dr. Ellyn Hack in 10/2018 for a virtual visit at which time she was doing well from a cardiac standpoint but activities limited by blindness. She was admitted in 03/2019 following a seizure and was found to have bilateral subdural hematomas which were treated with bilateral burr holes and drainage with drain placement.   Patient presented to the ED on 08/22/2019 after falling down a half-flight of stairs and suffering multiple injuries. Patient found to have acute on chronic right posterior parietal subdural hematoma, right frontal acute subdural hematoma, T2 vertebral body fracture with some mass effect on the spinal canal, left anterior 3-4 rib fractures, left clavicle fracture, and left wrist Colles fracture. Trauma and Ortho consulted. Started on [place] for routine seizure prophylaxis. No emergency interventions/surgeries felt to be needed.   Patient noted to be bradycardic with irregular rhythm. EKG shows normal sinus rhythm with bigeminy PACs. Rates in the 60's. Cardiology consulted for further evaluation.   At the time of this evaluation, patient resting comfortably. Husband at bedside. Patient's husband states  that following this admission, patient fell down the steps in late February/early March fracturing her right wrist and ribs on her right side. Patient does not remember what exactly caused her to fall. He also reports that patient was complaining of intermittent chest pain a few weeks ago. Patient then describes intermittent sharp left sided chest pain that was on and off for about 1 week. Pain was worse when leaning forward or with quick movements. Pain would last for a few minutes at a time and then resolve independently. No other chest pain since that time. Patient reportedly seen by PCP for this and work-up was normal. Patient does not remember what caused her to fall prior to this admission. She states she walked down the stairs to the kitchen to get a glass of water and opened the cabinet and that is the last thing that she remembers. Her husband found her sitting on the toilet with blood on her face and chest. Patient is not sure if she fell up or down the stairs. She does not know if she passed out or not. She does not remember having any symptoms before falling such as chest pain, shortness of breath, palpitations, lightheadedness, or dizziness. Patient denies any recent fevers or illnesses but husband states she has been losing weight and has a poor appetite.   Past Medical History:  Diagnosis Date  . Cancer [facility])     Past Surgical History:  Procedure Laterality Date  . BRAIN SURGERY    . CARDIAC SURGERY       Home Medications:  Prior to Admission medications   Medication Sig Start Date End Date Taking? Authorizing Provider  amLODipine (NORVASC) 10 MG tablet Take 10 mg by mouth daily.   Yes [provider]  aspirin 81 MG chewable tablet Chew 81 mg by mouth daily.   Yes [provider]  carvedilol (COREG) 3.125 MG tablet Take 3.125 mg by mouth 2 (two) times daily with a meal.   Yes [provider]  cholecalciferol (VITAMIN D3) 25 MCG (1000 UNIT) tablet Take  1,000 Units by mouth daily.   Yes [provider]  Cyanocobalamin (VITAMIN B 12 PO) Take 100 mcg by mouth daily.   Yes [provider]  escitalopram (LEXAPRO) 10 MG tablet Take 10 mg by mouth daily.   Yes [provider]  ipratropium (ATROVENT) 0.03 % nasal spray Place 2 sprays into both nostrils 3 (three) times daily.   Yes [provider]  letrozole (FEMARA) 2.5 MG tablet Take 2.5 mg by mouth daily.   Yes [provider]  PRESCRIPTION MEDICATION Place 1 drop into both eyes daily. Medication for eye pressure. ( Patient don't know the name)   Yes [provider]  simvastatin (ZOCOR) 10 MG tablet Take 10 mg by mouth daily.   Yes [provider]  valsartan (DIOVAN) 320 MG tablet Take 320 mg by mouth daily.   Yes [provider]    Inpatient Medications: Scheduled Meds: . acetaminophen  650 mg Oral Q6H  . amLODipine  10 mg Oral Daily  . carvedilol  3.125 mg Oral BID WC  . Chlorhexidine Gluconate Cloth  6 each Topical Q0600  . enoxaparin (LOVENOX) injection  30 mg Subcutaneous Q12H  . escitalopram  10 mg Oral Daily  . feeding supplement  1 Container Oral BID BM  . ipratropium  2 spray Each Nare TID  . irbesartan  300 mg Oral Daily  . letrozole  2.5 mg Oral Daily  . levETIRAcetam  500 mg Oral BID  . simvastatin  10 mg Oral Daily   Continuous Infusions:  PRN Meds: methocarbamol, morphine injection, ondansetron **OR** ondansetron (ZOFRAN) IV, oxyCODONE  Allergies:   No Known Allergies  Social History:   Social History   Socioeconomic History  . Marital status: Married    Spouse name: Not on file  . Number of children: Not on file  . Years of education: Not on file  . Highest education level: Not on file  Occupational History  . Not on file  Tobacco Use  . Smoking status: Never Smoker  . Smokeless tobacco: Never Used  Substance and Sexual Activity  . Alcohol use: Not Currently  . Drug use: Not Currently    . Sexual activity: Not on file  Other Topics Concern  . Not on file  Social History Narrative  . Not on file   Social Determinants of Health   Financial Resource Strain:   . Difficulty of Paying Living Expenses:   Food Insecurity:   . Worried About Charity fundraiser in the Last Year:   . Arboriculturist in the Last Year:   Transportation Needs:   . Film/video editor (Medical):   Marland Kitchen Lack of Transportation (Non-Medical):   Physical Activity:   . Days of Exercise per Week:   . Minutes of Exercise per Session:   Stress:   . Feeling of Stress :   Social Connections:   . Frequency of Communication with Friends and Family:   . Frequency of Social Gatherings with Friends and Family:   . Attends Religious Services:   . Active Member of  Clubs or Organizations:   . Attends Archivist Meetings:   Marland Kitchen Marital Status:   Intimate Partner Violence:   . Fear of Current or Ex-Partner:   . Emotionally Abused:   Marland Kitchen Physically Abused:   . Sexually Abused:     Family History:   Family History  Problem Relation Age of Onset  . Breast cancer Sister   . Breast cancer Sister      ROS:  Please see the history of present illness.  All other ROS reviewed and negative.     Physical Exam/Data:   Vitals:   08/24/19 0400 08/24/19 0811 08/24/19 1121 08/24/19 1520  BP: (!) 114/57 104/85 (!) 113/56 (!) 136/56  Pulse: 94 (!) 20 64 61  Resp: 14 17 17 18   Temp:  97.8 F (36.6 C) 97.6 F (36.4 C) 98.1 F (36.7 C)  TempSrc:  Oral Oral Oral  SpO2: 96% 98% 100% 100%  Weight:      Height:        Intake/Output Summary (Last 24 hours) at 08/24/2019 1644 Last data filed at 08/24/2019 1300 Gross per 24 hour  Intake 740 ml  Output 500 ml  Net 240 ml   Last 3 Weights 08/23/2019 08/22/2019  Weight (lbs) 133 lb 6.1 oz 134 lb  Weight (kg) 60.5 kg 60.782 kg     Body mass index is 22.89 kg/m.  General: 86 y.o. female resting comfortably in no acute distress. HEENT: Normocephalic and  atraumatic.  Neck: Supple. No carotid bruits. No JVD. Heart: RRR. Distinct S1 and S2. Soft systolic murmur noted. No gallops or rubs. Right radial pulses 2+. Unable to palpate left radial pulse due to wrapping. Distal pedal pulses 2+ and equal bilaterally. Lungs: No increased work of breathing. Clear to ausculation bilaterally. No wheezes, rhonchi, or rales.  Abdomen: Soft, non-distended, and non-tender to palpation. Bowel sounds present. MSK: Normal strength and tone for age. Extremities: No lower extremity edema. Left upper extremity wrapped and in sling. Mild abrasions on face. Skin: Warm and dry. Neuro: Alert and oriented x3. No focal deficits. Psych: Normal affect. Responds appropriately.   EKG:  The EKG was personally reviewed and demonstrates:  Normal sinus rhythm, 66 bpm, with bigeminy PACs and non-specific ST/T changes.   Telemetry:  Telemetry was personally reviewed and demonstrates:  Sinus rhythm with frequent PACs as well as some PVCs. Rates in the mid 40's to 70's.   Relevant CV Studies:  Myoview 09/26/2014:  Myocardial perfusion is normal. The study is normal. This is a low risk study. Overall left ventricular systolic function was normal. LV cavity size is normal. The left ventricular ejection fraction is normal (55%). Compared to the prior study, there are changes. - ischemia is no longer present  Laboratory Data:  High Sensitivity Troponin:  No results for input(s): TROPONINIHS in the last 720 hours.   Chemistry Recent Labs  Lab 08/22/19 1808 08/23/19 0726 08/24/19 0908  NA 141 140 140  K 4.5 3.9 3.7  CL 110 107 108  CO2 24 22 23   GLUCOSE 121* 117* 106*  BUN 21 11 15   CREATININE 0.92 0.70 0.92  CALCIUM 9.4 9.1 8.6*  GFRNONAA NOT CALCULATED >60 57*  GFRAA NOT CALCULATED >60 >60  ANIONGAP 7 11 9     Recent Labs  Lab 08/22/19 1808  PROT 7.1  ALBUMIN 3.7  AST 24  ALT 20  ALKPHOS 78  BILITOT 1.3*   Hematology Recent Labs  Lab 08/22/19 1808  08/23/19 [id number] 08/24/19 [id number]  WBC 9.1 4.7 4.1  RBC 3.91 3.99 3.57*  HGB 11.6* 11.9* 10.6*  HCT 36.5 36.3 32.4*  MCV 93.4 91.0 90.8  MCH 29.7 29.8 29.7  MCHC 31.8 32.8 32.7  RDW 13.3 13.2 13.4  PLT 159 159 149*   BNPNo results for input(s): BNP, PROBNP in the last 168 hours.  DDimer No results for input(s): DDIMER in the last 168 hours.   Radiology/Studies:  DG Clavicle Left  Result Date: 08/23/2019 CLINICAL DATA:  Injury EXAM: LEFT CLAVICLE - 2+ VIEWS COMPARISON:  Chest CT August 22, 2019 FINDINGS: Frontal and angled frontal views obtained. The fracture of the proximal left clavicle noted on CT is less well appreciable by radiography. The medial left clavicle is not well seen but appears to be displaced somewhat superiorly with respect to the remainder of the left clavicle. No other fracture evident. There is acromioclavicular separation on the left. Noah vert dislocation evident. There is osteoarthritic change in the left glenohumeral joint. Bones are osteoporotic. Visualized left lung clear. IMPRESSION: The fracture of the medial left clavicle is not well visualized by radiography. It is better seen on CT. There appears to be superior displacement of the medial left clavicle with respect to the remainder of the bone. No other fracture evident. There is acromioclavicular separation. No frank dislocation evident. Osteoarthritic change noted in the left glenohumeral joint. Underlying osteoporosis noted. Electronically Signed   By: Lowella Grip III M.D.   On: 08/23/2019 10:45   DG Wrist Complete Left  Result Date: 08/22/2019 CLINICAL DATA:  [place] down stairs.  Wrist pain and deformity. EXAM: LEFT WRIST - COMPLETE 3+ VIEW COMPARISON:  None. FINDINGS: Colles type fractures of the ulnar styloid and distal radial metaphysis, with slight ventral angulation and dorsal tilt of the distal radial articular surface. Chronic degenerative changes affect the first carpometacarpal joint. IMPRESSION: Colles  fractures of the distal radius and ulnar styloid. Electronically Signed   By: Nelson Chimes M.D.   On: 08/22/2019 18:10   CT HEAD WO CONTRAST  Result Date: 08/23/2019 CLINICAL DATA:  Follow-up subdural hemorrhage EXAM: CT HEAD WITHOUT CONTRAST TECHNIQUE: Contiguous axial images were obtained from the base of the skull through the vertex without intravenous contrast. COMPARISON:  Yesterday FINDINGS: Brain: Encephalomalacia at the anterior right insula and frontal operculum. Mixed density subdural collection on the right . Maximal thickness is at the low-density component and is 4 mm, mildly decreased. No left-sided collection is seen. Small volume subarachnoid hemorrhage at the right ambient cistern. No acute infarct or hydrocephalus. Vascular: No hyperdense vessel or unexpected calcification. Skull: More generalized scalp swelling. Negative for fracture. Bilateral parietal burr hole. Sinuses/Orbits: Negative IMPRESSION: 1. Slight decrease in the thin subdural hematoma on the right. 2. Minimal subarachnoid hemorrhage at the right ambient cistern, non progressed Electronically Signed   By: Monte Fantasia M.D.   On: 08/23/2019 10:20   CT HEAD WO CONTRAST  Result Date: 08/22/2019 CLINICAL DATA:  Status post trauma. EXAM: CT HEAD WITHOUT CONTRAST CT MAXILLOFACIAL WITHOUT CONTRAST TECHNIQUE: Multidetector CT imaging of the head and maxillofacial structures were performed using the standard protocol without intravenous contrast. Multiplanar CT image reconstructions of the maxillofacial structures were also generated. COMPARISON:  April 11, 2019 FINDINGS: CT HEAD FINDINGS Brain: There is mild cerebral atrophy with widening of the extra-axial spaces and ventricular dilatation. There are areas of decreased attenuation within the white matter tracts of the supratentorial brain, consistent with microvascular disease changes. A 4 mm thick acute on chronic right posterior  parietal subdural hemorrhage is seen. This is  decreased in size when compared to the prior study. A 1.6 mm thick linear hyperdense area is seen within the extra-axial space of the right frontal lobe (axial CT image 18, CT series number 3). There is no evidence of associated mass effect or midline shift. A small area of cortical encephalomalacia, with adjacent chronic white matter low attenuation, is seen within the right frontal lobe. Stable areas of parenchymal calcification are seen along the posteromedial aspect of the right occipital lobe. Vascular: No hyperdense vessel is identified. Skull: A small burr hole is seen along the posterior aspect of the vertex on the right and lateral aspect of the vertex on the left. Other: Mild left posterior parietal scalp soft tissue swelling is seen with moderate to marked severity frontal scalp soft tissue swelling noted along the midline. An associated diffuse frontal scalp soft tissue hematoma is present. Additional mild focal areas of scalp soft tissue swelling are seen along the anterolateral and posterolateral aspect of the vertex on the left. CT MAXILLOFACIAL FINDINGS Osseous: No fracture or mandibular dislocation. No destructive process. Orbits: Negative. No traumatic or inflammatory finding. Sinuses: Clear. Soft tissues: Mild bilateral superior paranasal soft tissue swelling is seen with moderate to marked severity frontal scalp soft tissue swelling noted along the midline. IMPRESSION: 1. 4 mm thick acute on chronic right posterior parietal subdural hemorrhage, decreased in size when compared to the prior study. 2. 1.6 mm thick linear hyperdense area is seen within the extra-axial space of the right frontal lobe which may represent a small amount of acute subdural hemorrhage. 3. Mild cerebral atrophy with widening of the extra-axial spaces and ventricular dilatation. 4. Mild left posterior parietal scalp soft tissue swelling with moderate to marked severity frontal scalp soft tissue swelling and hematoma noted  along the midline. 5. Mild bilateral superior paranasal soft tissue swelling. 6. No acute facial bone fracture. Electronically Signed   By: Virgina Norfolk M.D.   On: 08/22/2019 20:35   CT CHEST W CONTRAST  Result Date: 08/22/2019 CLINICAL DATA:  Status post trauma. EXAM: CT CHEST, ABDOMEN, AND PELVIS WITH CONTRAST TECHNIQUE: Multidetector CT imaging of the chest, abdomen and pelvis was performed following the standard protocol during bolus administration of intravenous contrast. CONTRAST:  130mL OMNIPAQUE IOHEXOL 300 MG/ML  SOLN COMPARISON:  None. FINDINGS: CT CHEST FINDINGS Cardiovascular: There is moderate severity calcification of the aortic arch. Normal heart size. No pericardial effusion. Moderate severity coronary artery calcification is seen. Mediastinum/Nodes: No enlarged mediastinal, hilar, or axillary lymph nodes. Thyroid gland, trachea, and esophagus demonstrate no significant findings. Lungs/Pleura: Very mild atelectasis is seen within the bilateral lower lobes and left upper lobe. There is no evidence of a pleural effusion or pneumothorax. Musculoskeletal: Multiple sternal wires are present. A chronic fracture deformity is seen involving the proximal aspect of the right clavicle. Acute fracture of the proximal left clavicle is seen. Acute anterolateral third and fourth left rib fractures are seen. CT ABDOMEN PELVIS FINDINGS Hepatobiliary: No focal liver abnormality is seen. Status post cholecystectomy. No biliary dilatation. Pancreas: Unremarkable. No pancreatic ductal dilatation or surrounding inflammatory changes. Spleen: Normal in size without focal abnormality. Adrenals/Urinary Tract: There is a 2.5 cm x 1.6 cm heterogeneous right adrenal mass. The left adrenal gland is normal in appearance. Kidneys are normal in size, without renal calculi or focal lesions. There is mild bilateral hydronephrosis. Bladder is unremarkable. Stomach/Bowel: Stomach is within normal limits. Appendix appears  normal. No evidence of bowel wall  thickening, distention, or inflammatory changes. Noninflamed diverticula are seen throughout the sigmoid colon. Vascular/Lymphatic: Moderate severity aortic calcification. No enlarged abdominal or pelvic lymph nodes. Reproductive: Uterus and bilateral adnexa are unremarkable. Other: No abdominal wall hernia or abnormality. No abdominopelvic ascites. Musculoskeletal: Multilevel degenerative changes seen throughout the lumbar spine IMPRESSION: 1. Acute anterolateral third and fourth left rib fractures. 2. Acute fracture of the proximal left clavicle. 3. Very mild bilateral lower lobe and left upper lobe atelectasis. 4. 2.5 cm x 1.6 cm heterogeneous right adrenal mass. Further evaluation with adrenal protocol CT or MRI is recommended. 5. Noninflamed sigmoid diverticulosis. Aortic Atherosclerosis (ICD10-I70.0). Electronically Signed   By: Virgina Norfolk M.D.   On: 08/22/2019 20:48   CT CERVICAL SPINE WO CONTRAST  Result Date: 08/22/2019 CLINICAL DATA:  Status post trauma. EXAM: CT CERVICAL SPINE WITHOUT CONTRAST TECHNIQUE: Multidetector CT imaging of the cervical spine was performed without intravenous contrast. Multiplanar CT image reconstructions were also generated. COMPARISON:  April 08, 2019 FINDINGS: Alignment: Normal. Skull base and vertebrae: No acute cervical spine fracture. Acute fracture of the posterior aspect of the T2 vertebral body is seen. There is mild subsequent mass effect on the adjacent portion of the anterior spinal canal and spinal cord (axial CT image 35, CT series number 8). Soft tissues and spinal canal: Mild mass effect on the anterior aspect of the spinal canal and spinal cord at the level of the T2 vertebral body, as described above. Disc levels: Mild multilevel endplate sclerosis is seen with mild to moderate severity multilevel intervertebral disc space narrowing. This is predominant stable in severity when compared to the prior study. Moderate  severity bilateral multilevel facet joint hypertrophy is noted. Upper chest: Negative. Other: None. IMPRESSION: Acute fracture of the posterior aspect of the T2 vertebral body with mild subsequent mass effect on the adjacent portion of the anterior spinal canal and spinal cord. MRI correlation is recommended. Electronically Signed   By: Virgina Norfolk M.D.   On: 08/22/2019 20:54   CT ABDOMEN PELVIS W CONTRAST  Result Date: 08/22/2019 CLINICAL DATA:  Status post trauma. EXAM: CT CHEST, ABDOMEN, AND PELVIS WITH CONTRAST TECHNIQUE: Multidetector CT imaging of the chest, abdomen and pelvis was performed following the standard protocol during bolus administration of intravenous contrast. CONTRAST:  123mL OMNIPAQUE IOHEXOL 300 MG/ML  SOLN COMPARISON:  None. FINDINGS: CT CHEST FINDINGS Cardiovascular: There is moderate severity calcification of the aortic arch. Normal heart size. No pericardial effusion. Moderate severity coronary artery calcification is seen. Mediastinum/Nodes: No enlarged mediastinal, hilar, or axillary lymph nodes. Thyroid gland, trachea, and esophagus demonstrate no significant findings. Lungs/Pleura: Very mild atelectasis is seen within the bilateral lower lobes and left upper lobe. There is no evidence of a pleural effusion or pneumothorax. Musculoskeletal: Multiple sternal wires are present. A chronic fracture deformity is seen involving the proximal aspect of the right clavicle. Acute fracture of the proximal left clavicle is seen. Acute anterolateral third and fourth left rib fractures are seen. CT ABDOMEN PELVIS FINDINGS Hepatobiliary: No focal liver abnormality is seen. Status post cholecystectomy. No biliary dilatation. Pancreas: Unremarkable. No pancreatic ductal dilatation or surrounding inflammatory changes. Spleen: Normal in size without focal abnormality. Adrenals/Urinary Tract: There is a 2.5 cm x 1.6 cm heterogeneous right adrenal mass. The left adrenal gland is normal in  appearance. Kidneys are normal in size, without renal calculi or focal lesions. There is mild bilateral hydronephrosis. Bladder is unremarkable. Stomach/Bowel: Stomach is within normal limits. Appendix appears normal. No evidence of bowel wall  thickening, distention, or inflammatory changes. Noninflamed diverticula are seen throughout the sigmoid colon. Vascular/Lymphatic: Moderate severity aortic calcification. No enlarged abdominal or pelvic lymph nodes. Reproductive: Uterus and bilateral adnexa are unremarkable. Other: No abdominal wall hernia or abnormality. No abdominopelvic ascites. Musculoskeletal: Multilevel degenerative changes seen throughout the lumbar spine IMPRESSION: 1. Acute anterolateral third and fourth left rib fractures. 2. Acute fracture of the proximal left clavicle. 3. Very mild bilateral lower lobe and left upper lobe atelectasis. 4. 2.5 cm x 1.6 cm heterogeneous right adrenal mass. Further evaluation with adrenal protocol CT or MRI is recommended. 5. Noninflamed sigmoid diverticulosis. Aortic Atherosclerosis (ICD10-I70.0). Electronically Signed   By: Virgina Norfolk M.D.   On: 08/22/2019 20:48   DG Pelvis Portable  Result Date: 08/22/2019 CLINICAL DATA:  [place] down stairs EXAM: PORTABLE PELVIS 1-2 VIEWS COMPARISON:  None. FINDINGS: There is no evidence of pelvic fracture or diastasis. No pelvic bone lesions are seen. IMPRESSION: Negative. Electronically Signed   By: Nelson Chimes M.D.   On: 08/22/2019 18:09   DG Chest Port 1 View  Result Date: 08/22/2019 CLINICAL DATA:  [place] down stairs. EXAM: PORTABLE CHEST 1 VIEW COMPARISON:  None. FINDINGS: Previous median sternotomy and CABG. Mild cardiomegaly. The lungs are clear. No pneumothorax or hemothorax. No acute finding seen in the region. IMPRESSION: Previous CABG. Cardiomegaly. No active disease or traumatic finding. Electronically Signed   By: Nelson Chimes M.D.   On: 08/22/2019 18:08   CT MAXILLOFACIAL WO CONTRAST  Result Date:  08/22/2019 CLINICAL DATA:  Status post trauma. EXAM: CT HEAD WITHOUT CONTRAST CT MAXILLOFACIAL WITHOUT CONTRAST TECHNIQUE: Multidetector CT imaging of the head and maxillofacial structures were performed using the standard protocol without intravenous contrast. Multiplanar CT image reconstructions of the maxillofacial structures were also generated. COMPARISON:  None. FINDINGS: CT HEAD FINDINGS Brain: There is mild cerebral atrophy with widening of the extra-axial spaces and ventricular dilatation. There are areas of decreased attenuation within the white matter tracts of the supratentorial brain, consistent with microvascular disease changes. A 4 mm thick acute on chronic right posterior parietal subdural hemorrhage is seen. This is decreased in size when compared to the prior study. A 1.6 mm thick linear hyperdense area is seen within the extra-axial space of the right frontal lobe (axial CT image 18, CT series number 3). There is no evidence of associated mass effect or midline shift. A small area of cortical encephalomalacia, with adjacent chronic white matter low attenuation, is seen within the right frontal lobe. Stable areas of parenchymal calcification are seen along the posteromedial aspect of the right occipital lobe. Vascular: No hyperdense vessel is identified. Skull: A small burr hole is seen along the posterior aspect of the vertex on the right and lateral aspect of the vertex on the left. Other: Mild left posterior parietal scalp soft tissue swelling is seen with moderate to marked severity frontal scalp soft tissue swelling noted along the midline. An associated diffuse frontal scalp soft tissue hematoma is present. Additional mild focal areas of scalp soft tissue swelling are seen along the anterolateral and posterolateral aspect of the vertex on the left. CT MAXILLOFACIAL FINDINGS Osseous: No fracture or mandibular dislocation. No destructive process. Orbits: Negative. No traumatic or inflammatory  finding. Sinuses: Clear. Soft tissues: Mild bilateral superior paranasal soft tissue swelling is seen with moderate to marked severity frontal scalp soft tissue swelling noted along the midline. IMPRESSION: 1. 4 mm thick acute on chronic right posterior parietal subdural hemorrhage, decreased in size when compared to  the prior study. 2. 1.6 mm thick linear hyperdense area is seen within the extra-axial space of the right frontal lobe which may represent a small amount of acute subdural hemorrhage. 3. Mild cerebral atrophy with widening of the extra-axial spaces and ventricular dilatation. 4. Mild left posterior parietal scalp soft tissue swelling with moderate to marked severity frontal scalp soft tissue swelling and hematoma noted along the midline. 5. Mild bilateral superior paranasal soft tissue swelling. 6. No acute facial bone fracture. Electronically Signed   By: Virgina Norfolk M.D.   On: 08/22/2019 20:37   Assessment and Plan:   Sinus Bradycardia - Cardiology consulted for intermittent bradycardia and irregular heart rhythm.  - EKG showed normal sinus rhythm with bigeminy PACs. Rates in the 60's.  - Telemetry shows sinus rhythm with rates ranging from mid 40's to 70's with PACs and PVCs.  - Patient asymptomatic with this.  - OK to continue to hold Coreg due to baseline bradycardia at times.  - Will order Echo.  - I do not think sinus bradycardia in the mid 40's would have caused her fall. Continue to monitor on telemetry. If no significant arrhythmias noted on telemetry, may consider outpatient monitor at discharge.  Fall with Multiple Injuries - Patient admitted after falling down the steps and suffering multiple injuries.  - Unclear whether there was any LOC. Patient does not remember what caused her to fall. No symptoms prior to fall.  - Possible mechanical fall; whoever, patient does not remember much about the event. Therefore, cannot rule out cardiac cause. - Will check Echo.  -  Continue to monitor on telemetry.  Atypical Chest Pain with Known CAD s/p CABG - History of CAD s/p CABG in 2004. Most recent Myoview in 2016 was low risk with no ischemia.  - Patient reports intermittent left sided sharp chest pain a couple of weeks ago. Pain would last for a few seconds and then resolve independently. Worse with leaning forward and quick movements of upper body.  - EKG shows no acute ischemic changes.  - Sounds very atypical. Possible musculoskeletal.  - Will check Echo.   Hypertension - BP mostly well controlled. - Continue Amlodipine 10mg  daily and Irbesartan 300mg  daily for now.  - Will defer management to primary team and Neuro given SDH.   Hyperlipidemia - Continue home statin.  Anemia - Hemoglobin 10.6 today, down from 11.9 yesterday.  - Monitor closely with known SDH. - Management per primary team.   Otherwise, per primary team: - Acute on chronic right posterior parietal SDH - Right frontal acute SDH - T2 vertebral fracture - Left anterior 3-4 rib fractures - Left clavicle fracture - Left wrist Colles fracture - Acute on chronic anemia - History of breast cancer   For questions or updates, please contact [facility] Please consult [email or web address] for contact info under     Signed, Darreld Mclean, PA-C  08/24/2019 4:44 PM  Attending Note:   The patient was seen and examined.  Agree with assessment and plan as noted above.  Changes made to the above note as needed.  Patient seen and independently examined with  Sande Rives, PA .   We discussed all aspects of the encounter. I agree with the assessment and plan as stated above.  1.   Syncope :  She has had several falls.  She has a history of coronary artery disease and coronary artery bypass grafting.  She has a history of hypertension and has been on to carvedilol,  amlodipine, valsartan.  She has been on telemetry for a couple of days and we have recorded some heart rates in the 40s.   While I do not think that these are low enough to have caused an episode of syncope.  I do think it is right to discontinue the carvedilol for now continue to observe.  She is fairly frail with her multiple fracture so we have not done orthostatic hypotension tension measurements. I would have a low threshold to lower the dose of amlodipine as well.  We will continue to follow.  We will try to get an echocardiogram.  She has multiple rib fractures on her left side so an echo might be difficult to obtain but we will see what we can do.  It will probably be worthwhile to get an event monitor as an outpatient.  2.  CAD :   No angina .    3.  HTN:   BP is on the low side.   Carvedilol has been held.  I would have a low threshold to reduce the dose or even hold her amlodipine if her blood pressure remains borderline low.    I have spent a total of 40 minutes with patient reviewing hospital  notes , telemetry, EKGs, labs and examining patient as well as establishing an assessment and plan that was discussed with the patient. > 50% of time was spent in direct patient care.    Thayer Headings, Brooke Bonito., MD, [facility] 08/24/2019, 5:23 PM [address]  [place] Pager [phone]

## 2019-08-24 NOTE — Progress Notes (Signed)
[facility] Progress Note     Subjective: CC-  States that she does not feel well, cannot elaborate on her symptoms. Denies pain, specifically denies headache, chest pain, abdominal pain. Denies n/v. Denies n/t or weakness of BUE/BLE.  Objective: Vital signs in last 24 hours: Temp:  [97.4 F (36.3 C)-98 F (36.7 C)] 97.8 F (36.6 C) (04/29 0811) Pulse Rate:  [20-144] 20 (04/29 0811) Resp:  [14-31] 17 (04/29 0811) BP: (103-150)/(43-85) 104/85 (04/29 0811) SpO2:  [95 %-99 %] 98 % (04/29 0811)    Intake/Output from previous day: 04/28 0701 - 04/29 0700 In: 406.7 [P.O.:240; I.V.:166.7] Out: 550 [Urine:550] Intake/Output this shift: Total I/O In: 200 [P.O.:200] Out: 200 [Urine:200]  PE: Gen:  Alert, NAD, pleasant HEENT: pupils equal and round Card:  Intermittent bradycardia, 2+ DP pulses Pulm:  CTAB, no W/R/R, rate and effort normal Abd: Soft, NT/ND, +BS Ext:  no BLE edema, calves soft and nontender, splint and sling to LUE/ fingers WWP without edema Psych: A&Ox3 Neuro: f/c, moving all 4 extremities Skin: no rashes noted, warm and dry  Lab Results:  Recent Labs    08/22/19 1808 08/23/19 0726  WBC 9.1 4.7  HGB 11.6* 11.9*  HCT 36.5 36.3  PLT 159 159   BMET Recent Labs    08/22/19 1808 08/23/19 0726  NA 141 140  K 4.5 3.9  CL 110 107  CO2 24 22  GLUCOSE 121* 117*  BUN 21 11  CREATININE 0.92 0.70  CALCIUM 9.4 9.1   PT/INR Recent Labs    08/22/19 1808  LABPROT 13.4  INR 1.1   CMP     Component Value Date/Time   NA 140 08/23/2019 0726   K 3.9 08/23/2019 0726   CL 107 08/23/2019 0726   CO2 22 08/23/2019 0726   GLUCOSE 117 (H) 08/23/2019 0726   BUN 11 08/23/2019 0726   CREATININE 0.70 08/23/2019 0726   CALCIUM 9.1 08/23/2019 0726   PROT 7.1 08/22/2019 1808   ALBUMIN 3.7 08/22/2019 1808   AST 24 08/22/2019 1808   ALT 20 08/22/2019 1808   ALKPHOS 78 08/22/2019 1808   BILITOT 1.3 (H) 08/22/2019 1808   GFRNONAA >60 08/23/2019 0726    GFRAA >60 08/23/2019 0726   Lipase  No results found for: LIPASE     Studies/Results: DG Clavicle Left  Result Date: 08/23/2019 CLINICAL DATA:  Injury EXAM: LEFT CLAVICLE - 2+ VIEWS COMPARISON:  Chest CT August 22, 2019 FINDINGS: Frontal and angled frontal views obtained. The fracture of the proximal left clavicle noted on CT is less well appreciable by radiography. The medial left clavicle is not well seen but appears to be displaced somewhat superiorly with respect to the remainder of the left clavicle. No other fracture evident. There is acromioclavicular separation on the left. Noah vert dislocation evident. There is osteoarthritic change in the left glenohumeral joint. Bones are osteoporotic. Visualized left lung clear. IMPRESSION: The fracture of the medial left clavicle is not well visualized by radiography. It is better seen on CT. There appears to be superior displacement of the medial left clavicle with respect to the remainder of the bone. No other fracture evident. There is acromioclavicular separation. No frank dislocation evident. Osteoarthritic change noted in the left glenohumeral joint. Underlying osteoporosis noted. Electronically Signed   By: Lowella Grip III M.D.   On: 08/23/2019 10:45   DG Wrist Complete Left  Result Date: 08/22/2019 CLINICAL DATA:  [address] [address] down stairs.  Wrist pain and deformity. EXAM: LEFT  WRIST - COMPLETE 3+ VIEW COMPARISON:  None. FINDINGS: Colles type fractures of the ulnar styloid and distal radial metaphysis, with slight ventral angulation and dorsal tilt of the distal radial articular surface. Chronic degenerative changes affect the first carpometacarpal joint. IMPRESSION: Colles fractures of the distal radius and ulnar styloid. Electronically Signed   By: Nelson Chimes M.D.   On: 08/22/2019 18:10   CT HEAD WO CONTRAST  Result Date: 08/23/2019 CLINICAL DATA:  Follow-up subdural hemorrhage EXAM: CT HEAD WITHOUT CONTRAST TECHNIQUE: Contiguous axial  images were obtained from the base of the skull through the vertex without intravenous contrast. COMPARISON:  Yesterday FINDINGS: Brain: Encephalomalacia at the anterior right insula and frontal operculum. Mixed density subdural collection on the right . Maximal thickness is at the low-density component and is 4 mm, mildly decreased. No left-sided collection is seen. Small volume subarachnoid hemorrhage at the right ambient cistern. No acute infarct or hydrocephalus. Vascular: No hyperdense vessel or unexpected calcification. Skull: More generalized scalp swelling. Negative for fracture. Bilateral parietal burr hole. Sinuses/Orbits: Negative IMPRESSION: 1. Slight decrease in the thin subdural hematoma on the right. 2. Minimal subarachnoid hemorrhage at the right ambient cistern, non progressed Electronically Signed   By: Monte Fantasia M.D.   On: 08/23/2019 10:20   CT HEAD WO CONTRAST  Result Date: 08/22/2019 CLINICAL DATA:  Status post trauma. EXAM: CT HEAD WITHOUT CONTRAST CT MAXILLOFACIAL WITHOUT CONTRAST TECHNIQUE: Multidetector CT imaging of the head and maxillofacial structures were performed using the standard protocol without intravenous contrast. Multiplanar CT image reconstructions of the maxillofacial structures were also generated. COMPARISON:  April 11, 2019 FINDINGS: CT HEAD FINDINGS Brain: There is mild cerebral atrophy with widening of the extra-axial spaces and ventricular dilatation. There are areas of decreased attenuation within the white matter tracts of the supratentorial brain, consistent with microvascular disease changes. A 4 mm thick acute on chronic right posterior parietal subdural hemorrhage is seen. This is decreased in size when compared to the prior study. A 1.6 mm thick linear hyperdense area is seen within the extra-axial space of the right frontal lobe (axial CT image 18, CT series number 3). There is no evidence of associated mass effect or midline shift. A small area of  cortical encephalomalacia, with adjacent chronic white matter low attenuation, is seen within the right frontal lobe. Stable areas of parenchymal calcification are seen along the posteromedial aspect of the right occipital lobe. Vascular: No hyperdense vessel is identified. Skull: A small burr hole is seen along the posterior aspect of the vertex on the right and lateral aspect of the vertex on the left. Other: Mild left posterior parietal scalp soft tissue swelling is seen with moderate to marked severity frontal scalp soft tissue swelling noted along the midline. An associated diffuse frontal scalp soft tissue hematoma is present. Additional mild focal areas of scalp soft tissue swelling are seen along the anterolateral and posterolateral aspect of the vertex on the left. CT MAXILLOFACIAL FINDINGS Osseous: No fracture or mandibular dislocation. No destructive process. Orbits: Negative. No traumatic or inflammatory finding. Sinuses: Clear. Soft tissues: Mild bilateral superior paranasal soft tissue swelling is seen with moderate to marked severity frontal scalp soft tissue swelling noted along the midline. IMPRESSION: 1. 4 mm thick acute on chronic right posterior parietal subdural hemorrhage, decreased in size when compared to the prior study. 2. 1.6 mm thick linear hyperdense area is seen within the extra-axial space of the right frontal lobe which may represent a small amount of acute subdural  hemorrhage. 3. Mild cerebral atrophy with widening of the extra-axial spaces and ventricular dilatation. 4. Mild left posterior parietal scalp soft tissue swelling with moderate to marked severity frontal scalp soft tissue swelling and hematoma noted along the midline. 5. Mild bilateral superior paranasal soft tissue swelling. 6. No acute facial bone fracture. Electronically Signed   By: Virgina Norfolk M.D.   On: 08/22/2019 20:35   CT CHEST W CONTRAST  Result Date: 08/22/2019 CLINICAL DATA:  Status post trauma.  EXAM: CT CHEST, ABDOMEN, AND PELVIS WITH CONTRAST TECHNIQUE: Multidetector CT imaging of the chest, abdomen and pelvis was performed following the standard protocol during bolus administration of intravenous contrast. CONTRAST:  182mL OMNIPAQUE IOHEXOL 300 MG/ML  SOLN COMPARISON:  None. FINDINGS: CT CHEST FINDINGS Cardiovascular: There is moderate severity calcification of the aortic arch. Normal heart size. No pericardial effusion. Moderate severity coronary artery calcification is seen. Mediastinum/Nodes: No enlarged mediastinal, hilar, or axillary lymph nodes. Thyroid gland, trachea, and esophagus demonstrate no significant findings. Lungs/Pleura: Very mild atelectasis is seen within the bilateral lower lobes and left upper lobe. There is no evidence of a pleural effusion or pneumothorax. Musculoskeletal: Multiple sternal wires are present. A chronic fracture deformity is seen involving the proximal aspect of the right clavicle. Acute fracture of the proximal left clavicle is seen. Acute anterolateral third and fourth left rib fractures are seen. CT ABDOMEN PELVIS FINDINGS Hepatobiliary: No focal liver abnormality is seen. Status post cholecystectomy. No biliary dilatation. Pancreas: Unremarkable. No pancreatic ductal dilatation or surrounding inflammatory changes. Spleen: Normal in size without focal abnormality. Adrenals/Urinary Tract: There is a 2.5 cm x 1.6 cm heterogeneous right adrenal mass. The left adrenal gland is normal in appearance. Kidneys are normal in size, without renal calculi or focal lesions. There is mild bilateral hydronephrosis. Bladder is unremarkable. Stomach/Bowel: Stomach is within normal limits. Appendix appears normal. No evidence of bowel wall thickening, distention, or inflammatory changes. Noninflamed diverticula are seen throughout the sigmoid colon. Vascular/Lymphatic: Moderate severity aortic calcification. No enlarged abdominal or pelvic lymph nodes. Reproductive: Uterus and  bilateral adnexa are unremarkable. Other: No abdominal wall hernia or abnormality. No abdominopelvic ascites. Musculoskeletal: Multilevel degenerative changes seen throughout the lumbar spine IMPRESSION: 1. Acute anterolateral third and fourth left rib fractures. 2. Acute fracture of the proximal left clavicle. 3. Very mild bilateral lower lobe and left upper lobe atelectasis. 4. 2.5 cm x 1.6 cm heterogeneous right adrenal mass. Further evaluation with adrenal protocol CT or MRI is recommended. 5. Noninflamed sigmoid diverticulosis. Aortic Atherosclerosis (ICD10-I70.0). Electronically Signed   By: Virgina Norfolk M.D.   On: 08/22/2019 20:48   CT CERVICAL SPINE WO CONTRAST  Result Date: 08/22/2019 CLINICAL DATA:  Status post trauma. EXAM: CT CERVICAL SPINE WITHOUT CONTRAST TECHNIQUE: Multidetector CT imaging of the cervical spine was performed without intravenous contrast. Multiplanar CT image reconstructions were also generated. COMPARISON:  April 08, 2019 FINDINGS: Alignment: Normal. Skull base and vertebrae: No acute cervical spine fracture. Acute fracture of the posterior aspect of the T2 vertebral body is seen. There is mild subsequent mass effect on the adjacent portion of the anterior spinal canal and spinal cord (axial CT image 35, CT series number 8). Soft tissues and spinal canal: Mild mass effect on the anterior aspect of the spinal canal and spinal cord at the level of the T2 vertebral body, as described above. Disc levels: Mild multilevel endplate sclerosis is seen with mild to moderate severity multilevel intervertebral disc space narrowing. This is predominant stable in severity when  compared to the prior study. Moderate severity bilateral multilevel facet joint hypertrophy is noted. Upper chest: Negative. Other: None. IMPRESSION: Acute fracture of the posterior aspect of the T2 vertebral body with mild subsequent mass effect on the adjacent portion of the anterior spinal canal and spinal  cord. MRI correlation is recommended. Electronically Signed   By: Virgina Norfolk M.D.   On: 08/22/2019 20:54   CT ABDOMEN PELVIS W CONTRAST  Result Date: 08/22/2019 CLINICAL DATA:  Status post trauma. EXAM: CT CHEST, ABDOMEN, AND PELVIS WITH CONTRAST TECHNIQUE: Multidetector CT imaging of the chest, abdomen and pelvis was performed following the standard protocol during bolus administration of intravenous contrast. CONTRAST:  171mL OMNIPAQUE IOHEXOL 300 MG/ML  SOLN COMPARISON:  None. FINDINGS: CT CHEST FINDINGS Cardiovascular: There is moderate severity calcification of the aortic arch. Normal heart size. No pericardial effusion. Moderate severity coronary artery calcification is seen. Mediastinum/Nodes: No enlarged mediastinal, hilar, or axillary lymph nodes. Thyroid gland, trachea, and esophagus demonstrate no significant findings. Lungs/Pleura: Very mild atelectasis is seen within the bilateral lower lobes and left upper lobe. There is no evidence of a pleural effusion or pneumothorax. Musculoskeletal: Multiple sternal wires are present. A chronic fracture deformity is seen involving the proximal aspect of the right clavicle. Acute fracture of the proximal left clavicle is seen. Acute anterolateral third and fourth left rib fractures are seen. CT ABDOMEN PELVIS FINDINGS Hepatobiliary: No focal liver abnormality is seen. Status post cholecystectomy. No biliary dilatation. Pancreas: Unremarkable. No pancreatic ductal dilatation or surrounding inflammatory changes. Spleen: Normal in size without focal abnormality. Adrenals/Urinary Tract: There is a 2.5 cm x 1.6 cm heterogeneous right adrenal mass. The left adrenal gland is normal in appearance. Kidneys are normal in size, without renal calculi or focal lesions. There is mild bilateral hydronephrosis. Bladder is unremarkable. Stomach/Bowel: Stomach is within normal limits. Appendix appears normal. No evidence of bowel wall thickening, distention, or  inflammatory changes. Noninflamed diverticula are seen throughout the sigmoid colon. Vascular/Lymphatic: Moderate severity aortic calcification. No enlarged abdominal or pelvic lymph nodes. Reproductive: Uterus and bilateral adnexa are unremarkable. Other: No abdominal wall hernia or abnormality. No abdominopelvic ascites. Musculoskeletal: Multilevel degenerative changes seen throughout the lumbar spine IMPRESSION: 1. Acute anterolateral third and fourth left rib fractures. 2. Acute fracture of the proximal left clavicle. 3. Very mild bilateral lower lobe and left upper lobe atelectasis. 4. 2.5 cm x 1.6 cm heterogeneous right adrenal mass. Further evaluation with adrenal protocol CT or MRI is recommended. 5. Noninflamed sigmoid diverticulosis. Aortic Atherosclerosis (ICD10-I70.0). Electronically Signed   By: Virgina Norfolk M.D.   On: 08/22/2019 20:48   DG Pelvis Portable  Result Date: 08/22/2019 CLINICAL DATA:  [place] [address] down stairs EXAM: PORTABLE PELVIS 1-2 VIEWS COMPARISON:  None. FINDINGS: There is no evidence of pelvic fracture or diastasis. No pelvic bone lesions are seen. IMPRESSION: Negative. Electronically Signed   By: Nelson Chimes M.D.   On: 08/22/2019 18:09   DG Chest Port 1 View  Result Date: 08/22/2019 CLINICAL DATA:  [place] [address] down stairs. EXAM: PORTABLE CHEST 1 VIEW COMPARISON:  None. FINDINGS: Previous median sternotomy and CABG. Mild cardiomegaly. The lungs are clear. No pneumothorax or hemothorax. No acute finding seen in the region. IMPRESSION: Previous CABG. Cardiomegaly. No active disease or traumatic finding. Electronically Signed   By: Nelson Chimes M.D.   On: 08/22/2019 18:08   CT MAXILLOFACIAL WO CONTRAST  Result Date: 08/22/2019 CLINICAL DATA:  Status post trauma. EXAM: CT HEAD WITHOUT CONTRAST CT MAXILLOFACIAL WITHOUT CONTRAST TECHNIQUE: Multidetector  CT imaging of the head and maxillofacial structures were performed using the standard protocol without intravenous contrast. Multiplanar  CT image reconstructions of the maxillofacial structures were also generated. COMPARISON:  None. FINDINGS: CT HEAD FINDINGS Brain: There is mild cerebral atrophy with widening of the extra-axial spaces and ventricular dilatation. There are areas of decreased attenuation within the white matter tracts of the supratentorial brain, consistent with microvascular disease changes. A 4 mm thick acute on chronic right posterior parietal subdural hemorrhage is seen. This is decreased in size when compared to the prior study. A 1.6 mm thick linear hyperdense area is seen within the extra-axial space of the right frontal lobe (axial CT image 18, CT series number 3). There is no evidence of associated mass effect or midline shift. A small area of cortical encephalomalacia, with adjacent chronic white matter low attenuation, is seen within the right frontal lobe. Stable areas of parenchymal calcification are seen along the posteromedial aspect of the right occipital lobe. Vascular: No hyperdense vessel is identified. Skull: A small burr hole is seen along the posterior aspect of the vertex on the right and lateral aspect of the vertex on the left. Other: Mild left posterior parietal scalp soft tissue swelling is seen with moderate to marked severity frontal scalp soft tissue swelling noted along the midline. An associated diffuse frontal scalp soft tissue hematoma is present. Additional mild focal areas of scalp soft tissue swelling are seen along the anterolateral and posterolateral aspect of the vertex on the left. CT MAXILLOFACIAL FINDINGS Osseous: No fracture or mandibular dislocation. No destructive process. Orbits: Negative. No traumatic or inflammatory finding. Sinuses: Clear. Soft tissues: Mild bilateral superior paranasal soft tissue swelling is seen with moderate to marked severity frontal scalp soft tissue swelling noted along the midline. IMPRESSION: 1. 4 mm thick acute on chronic right posterior parietal subdural  hemorrhage, decreased in size when compared to the prior study. 2. 1.6 mm thick linear hyperdense area is seen within the extra-axial space of the right frontal lobe which may represent a small amount of acute subdural hemorrhage. 3. Mild cerebral atrophy with widening of the extra-axial spaces and ventricular dilatation. 4. Mild left posterior parietal scalp soft tissue swelling with moderate to marked severity frontal scalp soft tissue swelling and hematoma noted along the midline. 5. Mild bilateral superior paranasal soft tissue swelling. 6. No acute facial bone fracture. Electronically Signed   By: Virgina Norfolk M.D.   On: 08/22/2019 20:37    Anti-infectives: Anti-infectives (From admission, onward)   None       Assessment/Plan Fall down stairs  Acute on chronic right posterior parietal SDH, right frontal acute SDH - NSGY c/s (Dr. Zada Finders), keppra x7d for sz ppx, okay for DVT ppx 4/29, hold [id number]  x1 week Scalp hematoma - monitor clinically T2 vertebral body fracture - NSGY c/s as above, okay to d/c c-collar Left anterior 3-4 rib fractures - pain control, pulm toilet/IS Left clavicle fracture - per Dr. Mardelle Matte, nonop, NWB LUE in sling Left wrist Colles fracture - per Dr. Amedeo Plenty, nonop, NWB LUE in splint Acute on chronic anemia - Hgb 11.9 (4/28), recheck today Bradycardia - intermittent, irregular rhythm on tele. Check EKG, labs S/p CABG 2004, CAD - on coreg, norvasc, irbesartan, zocor HTN HLD Hx breast cancer s/p lumpectomy and chemo/radiation - on letrozole Blindness  ID - none FEN - regular diet, MIVF off VTE - SCDs, start LMWH 4/29 Foley - none Follow up - NS, Hilton Sinclair, PCP Dispo - Bradycardia work  up as above. Continue scheduled tylenol. Therapies.   LOS: 2 days    Wellington Hampshire, [facility] 08/24/2019, 9:21 AM Please see Amion for pager number during day hours 7:00am-4:30pm

## 2019-08-24 NOTE — Plan of Care (Signed)

## 2019-08-24 NOTE — Progress Notes (Signed)
Called nurse report to 4N staff nurse.

## 2019-08-25 ENCOUNTER — Inpatient Hospital Stay (HOSPITAL_COMMUNITY): Payer: Medicare Other

## 2019-08-25 DIAGNOSIS — I951 Orthostatic hypotension: Secondary | ICD-10-CM

## 2019-08-25 DIAGNOSIS — R9431 Abnormal electrocardiogram [ECG] [EKG]: Secondary | ICD-10-CM

## 2019-08-25 LAB — CBC
HCT: 33.1 % — ABNORMAL LOW (ref 36.0–46.0)
Hemoglobin: 10.8 g/dL — ABNORMAL LOW (ref 12.0–15.0)
MCH: 30 pg (ref 26.0–34.0)
MCHC: 32.6 g/dL (ref 30.0–36.0)
MCV: 91.9 fL (ref 80.0–100.0)
Platelets: 150 10*3/uL (ref 150–400)
RBC: 3.6 MIL/uL — ABNORMAL LOW (ref 3.87–5.11)
RDW: 13.4 % (ref 11.5–15.5)
WBC: 3.6 10*3/uL — ABNORMAL LOW (ref 4.0–10.5)
nRBC: 0 % (ref 0.0–0.2)

## 2019-08-25 LAB — BASIC METABOLIC PANEL
Anion gap: 6 (ref 5–15)
BUN: 14 mg/dL (ref 8–23)
CO2: 23 mmol/L (ref 22–32)
Calcium: 8.6 mg/dL — ABNORMAL LOW (ref 8.9–10.3)
Chloride: 110 mmol/L (ref 98–111)
Creatinine, Ser: 0.85 mg/dL (ref 0.44–1.00)
GFR calc Af Amer: >60 mL/min (ref >60)
GFR calc non Af Amer: >60 mL/min (ref >60)
Glucose, Bld: 105 mg/dL — ABNORMAL HIGH (ref 70–99)
Potassium: 4.1 mmol/L (ref 3.5–5.1)
Sodium: 139 mmol/L (ref 135–145)

## 2019-08-25 LAB — ECHOCARDIOGRAM COMPLETE
Height: 64 in
Weight: 2134.05 oz

## 2019-08-25 MED ORDER — POLYETHYLENE GLYCOL 3350 17 G PO PACK: 17.0000 g | PACK | Freq: Every day | ORAL | Status: DC | PRN

## 2019-08-25 MED ORDER — PERFLUTREN LIPID MICROSPHERE
1.0000 mL | INTRAVENOUS | Status: AC | PRN
  Administered 2019-08-25: 2 mL via INTRAVENOUS
  Filled 2019-08-25: qty 10

## 2019-08-25 MED ORDER — DOCUSATE SODIUM 100 MG PO CAPS
100.0000 mg | ORAL_CAPSULE | Freq: Two times a day (BID) | ORAL | Status: DC
  Administered 2019-08-25 – 2019-08-26 (×3): 100 mg via ORAL
  Filled 2019-08-25 (×3): qty 1

## 2019-08-25 MED ORDER — ENSURE ENLIVE PO LIQD
237.0000 mL | Freq: Three times a day (TID) | ORAL | Status: DC
  Administered 2019-08-25 – 2019-08-26 (×4): 237 mL via ORAL

## 2019-08-25 NOTE — Progress Notes (Signed)
Patient ID: Olivia Gray, female   DOB: [birth year], 86 y.o.   MRN: [id number] Patient's chart is reviewed.  Patient has a left distal radius fracture.  We will continue immobilization and transition to a cast next week.  Dr. Griffin Basil has reviewed the left clavicle which will be treated conservatively.  All questions have been addressed.  Maverick Dieudonne MD

## 2019-08-25 NOTE — Evaluation (Signed)
Occupational Therapy Evaluation Patient Details Name: Olivia Gray MRN: [id number] DOB: 07-Jul-1932 Today's Date: 08/25/2019    History of Present Illness 86 yo female with baseline blindness who presents as a level 2 trauma after apparently falling down about a half-flight of stairs. Injuries include acute on chronic R posterior parietal SDH, R frontal acute SDH, T2 vertebral body fx w/ mass effect on spinal canal, L 3-4 rib fxs, L clavicle fx, L wrist colles fx.   Clinical Impression   Patient is s/p fall with multiple fx resulting in functional limitations due to the deficits listed below (see OT problem list). Pt currently requires min (A) for balance and requires (A) for adls due to single UE use.  Patient will benefit from skilled OT acutely to increase independence and safety with ADLS to allow discharge home with spouse. Spouse and patient need continued education on 24/7 and min (A) for all transfers for d/c home. Ot to further assess executive cognitive assessment.     Follow Up Recommendations  Home health OT    Equipment Recommendations  None recommended by OT    Recommendations for Other Services Speech consult(cognition)     Precautions / Restrictions Precautions Precautions: Fall Precaution Comments: blind Required Braces or Orthoses: Sling Restrictions Weight Bearing Restrictions: Yes LUE Weight Bearing: Non weight bearing      Mobility Bed Mobility               General bed mobility comments: in chair ona rrival  Transfers Overall transfer level: Needs assistance   Transfers: Sit to/from Stand Sit to Stand: Min guard              Balance Overall balance assessment: Needs assistance Sitting-balance support: No upper extremity supported;Feet supported Sitting balance-Leahy Scale: Good Sitting balance - Comments: supervision   Standing balance support: No upper extremity supported;During functional activity Standing balance-Leahy Scale:  Fair Standing balance comment: min Guard at sink brushing teeth                           ADL either performed or assessed with clinical judgement   ADL Overall ADL's : Needs assistance/impaired Eating/Feeding: Minimal assistance Eating/Feeding Details (indicate cue type and reason): due to visual deficits and one hand Grooming: Wash/dry face;Oral care;Min guard;Standing Grooming Details (indicate cue type and reason): pt with LOB several times to the LEft side Upper Body Bathing: Moderate assistance   Lower Body Bathing: Moderate assistance   Upper Body Dressing : Moderate assistance   Lower Body Dressing: Moderate assistance   Toilet Transfer: Minimal assistance Toilet Transfer Details (indicate cue type and reason): (A) also is due to visual deficits to navigate environment         Functional mobility during ADLs: Minimal assistance General ADL Comments: pt requires increased time to demonstrate gait velocity increase with ambulation. pt with increased time to feel comfortable to increase speed in wide open space with therapist on either side. Pt with LOB with head turns and change of position. pt high fall risk     Vision Baseline Vision/History: Legally blind Additional Comments: complete blindness no shadows since 2007 per patient     Perception     Praxis      Pertinent Vitals/Pain Pain Assessment: No/denies pain Faces Pain Scale: Hurts a little bit Pain Location: L arm Pain Descriptors / Indicators: Guarding Pain Intervention(s): Repositioned;Monitored during session     Hand Dominance Right   Extremity/Trunk Assessment Upper Extremity  Assessment Upper Extremity Assessment: LUE deficits/detail LUE Deficits / Details: arom of digits, splint in place for wrist to elbow. Sling education on position to 90 degrees and elevation for edema managment   Lower Extremity Assessment Lower Extremity Assessment: Defer to PT evaluation RLE Deficits / Details:  pt reporting intermittent tingling in BLE from toes all the way up LE LLE Deficits / Details: pt reporting intermittent tingling in BLE from toes all the way up LE   Cervical / Trunk Assessment Cervical / Trunk Assessment: Kyphotic   Communication Communication Communication: No difficulties   Cognition Arousal/Alertness: Awake/alert Behavior During Therapy: WFL for tasks assessed/performed Overall Cognitive Status: No family/caregiver present to determine baseline cognitive functioning Area of Impairment: Memory                     Memory: Decreased short-term memory         General Comments: pt reports not brushing her teeth since being hospitalized however tooth brush is wet when found in bathroom   General Comments  HR 78 on arrival Pt with BP 110/59 (76) pt reports feeling tired BP noted to be 87/51 (63) Hr increased 90 then decreased with sitting to HR 70. pt constantly wiping tongue and nose throughout session. pt provided oral care and continues task immediately following. pt does not appear to be producing and secretions to warrant this continuous need    Exercises Exercises: Other exercises Other Exercises Other Exercises: hand AROM digits 15 reps. education on edema management and digit activation   Shoulder Instructions      Home Living Family/patient expects to be discharged to:: Private residence Living Arrangements: Spouse/significant other Available Help at Discharge: Family;Available 24 hours/day Type of Home: House Home Access: Stairs to enter [employer] of Steps: 1 Entrance Stairs-Rails: None Home Layout: Two level Alternate Level Stairs-Number of Steps: flight Alternate Level Stairs-Rails: Right;Left Bathroom Shower/Tub: Teacher, early years/pre: Standard     Home Equipment: None   Additional Comments: has a daughter and son that live in [place]  Lives With: Spouse    Prior Functioning/Environment Level of Independence:  Independent        Comments: pt does report multiple falls, last one 3 months ago        OT Problem List: Decreased strength;Decreased activity tolerance;Decreased range of motion;Impaired balance (sitting and/or standing);Impaired vision/perception;Decreased coordination;Decreased cognition;Decreased safety awareness;Decreased knowledge of use of DME or AE;Impaired UE functional use      OT Treatment/Interventions: Self-care/ADL training;Therapeutic exercise;Energy conservation;DME and/or AE instruction;Modalities;Manual therapy;Therapeutic activities;Cognitive remediation/compensation;Patient/family education;Balance training    OT Goals(Current goals can be found in the care plan section) Acute Rehab OT Goals Patient Stated Goal: To go home OT Goal Formulation: With patient Time For Goal Achievement: 09/08/19 Potential to Achieve Goals: Good  OT Frequency: Min 3X/week   Barriers to D/C:    uncertain of spouse (A) level. Pt reports spouse is only local support       Co-evaluation PT/OT/SLP Co-Evaluation/Treatment: Yes Reason for Co-Treatment: Complexity of the patient's impairments (multi-system involvement);Necessary to address cognition/behavior during functional activity;For patient/therapist safety;To address functional/ADL transfers PT goals addressed during session: Mobility/safety with mobility;Balance;Strengthening/ROM OT goals addressed during session: ADL's and self-care;Proper use of Adaptive equipment and DME;Strengthening/ROM      AM-PAC OT "6 Clicks" Daily Activity     Outcome Measure Help from another person eating meals?: A Lot Help from another person taking care of personal grooming?: A Lot Help from another person toileting, which  includes using toliet, bedpan, or urinal?: A Lot Help from another person bathing (including washing, rinsing, drying)?: A Lot Help from another person to put on and taking off regular upper body clothing?: A Lot Help from  another person to put on and taking off regular lower body clothing?: A Lot 6 Click Score: 12   End of Session Equipment Utilized During Treatment: Other (comment)(sling) Nurse Communication: Mobility status;Precautions  Activity Tolerance: Patient tolerated treatment well Patient left: in chair;with call bell/phone within reach;with chair alarm set  OT Visit Diagnosis: Unsteadiness on feet (R26.81);Muscle weakness (generalized) (M62.81)                Time: [id number] OT Time Calculation (min): 34 min Charges:  OT General Charges $OT Visit: 1 Visit OT Evaluation $OT Eval Moderate Complexity: 1 Mod   Brynn, OTR/L  Acute Rehabilitation Services Pager: [phone] Office: [phone] .   Jeri Modena 08/25/2019, 1:41 PM

## 2019-08-25 NOTE — Evaluation (Signed)
Speech Language Pathology Evaluation Patient Details Name: Olivia Gray MRN: [id number] DOB: [birth year] Today's Date: 08/25/2019 Time: [id number] SLP Time Calculation (min) (ACUTE ONLY): 19 min  Problem List:  Patient Active Problem List   Diagnosis Date Noted  . Thoracic spine fracture ([place]) 08/22/2019   Past Medical History:  Past Medical History:  Diagnosis Date  . Cancer [facility])    Past Surgical History:  Past Surgical History:  Procedure Laterality Date  . BRAIN SURGERY    . CARDIAC SURGERY     HPI:  86 yo female with baseline blindness who presents as a level 2 trauma after apparently falling down about a half-flight of stairs. Injuries include acute on chronic R posterior parietal SDH, R frontal acute SDH, T2 vertebral body fx w/ mass effect on spinal canal, L 3-4 rib fxs, L clavicle fx, L wrist colles fx.   Assessment / Plan / Recommendation Clinical Impression  Pt reports feeling like her processing is slower this admission compared to her baseline, although no family is present to confirm. Per chart review, her husband described cognitive changes s/p SDH in December 2020 but a cognitive evaluation was not ordered inpatient for comparison. Today she is oriented and communicates fluently and clearly, but she has difficulty with some of her other processes, such as problem solving, emergent awareness, and short-term recall. Her executive functions, like more abstract thinking, reasoning, and thought organization, are also limited. She would benefit from additional SLP f/u to maximize cognition, anticipating 24/7 supervision and [facility] SLP upon discharge.     SLP Assessment  SLP Recommendation/Assessment: Patient needs continued Speech Lanaguage Pathology Services SLP Visit Diagnosis: Cognitive communication deficit (R41.841)    Follow Up Recommendations  Home health SLP;24 hour supervision/assistance    Frequency and Duration min 2x/week  2 weeks      SLP Evaluation Cognition  Overall Cognitive Status: No family/caregiver present to determine baseline cognitive functioning Arousal/Alertness: Awake/alert Orientation Level: Oriented X4 Attention: Sustained;Selective Sustained Attention: Appears intact Selective Attention: Impaired Selective Attention Impairment: Verbal basic Memory: Impaired Memory Impairment: Storage deficit;Retrieval deficit;Decreased recall of new information Awareness: Impaired Awareness Impairment: Emergent impairment Problem Solving: Impaired Problem Solving Impairment: Verbal basic Executive Function: Reasoning Reasoning: Impaired Reasoning Impairment: Verbal basic Safety/Judgment: Appears intact       Comprehension  Auditory Comprehension Overall Auditory Comprehension: Appears within functional limits for tasks assessed    Expression Expression Primary Mode of Expression: Verbal Verbal Expression Overall Verbal Expression: Appears within functional limits for tasks assessed   Oral / Motor  Motor Speech Overall Motor Speech: Appears within functional limits for tasks assessed   GO                    Osie Bond., M.A. [place] Acute Rehabilitation Services Pager [phone] Office [phone]  08/25/2019, 1:31 PM

## 2019-08-25 NOTE — Progress Notes (Addendum)
Progress Note  Patient Name: Olivia Gray Date of Encounter: 08/25/2019  Primary Cardiologist: Glenetta Hew, MD  Subjective   Denies recurrent chest pain. HR improved off BB>>although having very frequent PACs and some PVCs  Inpatient Medications    Scheduled Meds: . acetaminophen  650 mg Oral Q6H  . amLODipine  10 mg Oral Daily  . Chlorhexidine Gluconate Cloth  6 each Topical Q0600  . docusate sodium  100 mg Oral BID  . enoxaparin (LOVENOX) injection  30 mg Subcutaneous Q12H  . escitalopram  10 mg Oral Daily  . feeding supplement (ENSURE ENLIVE)  237 mL Oral TID BM  . ipratropium  2 spray Each Nare TID  . irbesartan  300 mg Oral Daily  . letrozole  2.5 mg Oral Daily  . levETIRAcetam  500 mg Oral BID  . simvastatin  10 mg Oral Daily   Continuous Infusions:  PRN Meds: methocarbamol, morphine injection, ondansetron **OR** ondansetron (ZOFRAN) IV, oxyCODONE, perflutren lipid microspheres (DEFINITY) IV suspension, polyethylene glycol   Vital Signs    Vitals:   08/24/19 2156 08/24/19 2354 08/25/19 0338 08/25/19 0831  BP:  (!) 147/63 (!) 142/58 130/66  Pulse:  (!) 40 64 69  Resp: (!) 21 18 18 20   Temp:  97.9 F (36.6 C) 98 F (36.7 C) 98.6 F (37 C)  TempSrc:  Oral Oral Oral  SpO2:  98% 99% 98%  Weight:      Height:        Intake/Output Summary (Last 24 hours) at 08/25/2019 1124 Last data filed at 08/25/2019 0845 Gross per 24 hour  Intake 660 ml  Output -  Net 660 ml   Filed Weights   08/22/19 1738 08/23/19 0600  Weight: 60.8 kg 60.5 kg    Physical Exam   General: Elderly, NAD Neck: Negative for carotid bruits. No JVD Lungs:Clear to ausculation bilaterally. No wheezes Breathing is unlabored. Cardiovascular: RRR with S1 S2. No murmurs Abdomen: Soft, non-tender, non-distended. No obvious abdominal masses. Extremities: No edema. Radial pulses 2+ bilaterally Neuro: Alert and oriented. No focal deficits. No facial asymmetry. MAE spontaneously. Psych:  Responds to questions appropriately with normal affect.    Labs    Chemistry Recent Labs  Lab 08/22/19 1808 08/22/19 1808 08/23/19 0726 08/24/19 0908 08/25/19 0654  NA 141   < > 140 140 139  K 4.5   < > 3.9 3.7 4.1  CL 110   < > 107 108 110  CO2 24   < > 22 23 23   GLUCOSE 121*   < > 117* 106* 105*  BUN 21   < > 11 15 14   CREATININE 0.92   < > 0.70 0.92 0.85  CALCIUM 9.4   < > 9.1 8.6* 8.6*  PROT 7.1  --   --   --   --   ALBUMIN 3.7  --   --   --   --   AST 24  --   --   --   --   ALT 20  --   --   --   --   ALKPHOS 78  --   --   --   --   BILITOT 1.3*  --   --   --   --   GFRNONAA NOT CALCULATED   < > >60 57* >60  GFRAA NOT CALCULATED   < > >60 >60 >60  ANIONGAP 7   < > 11 9 6    < > =  values in this interval not displayed.     Hematology Recent Labs  Lab 08/23/19 0726 08/24/19 0908 08/25/19 0654  WBC 4.7 4.1 3.6*  RBC 3.99 3.57* 3.60*  HGB 11.9* 10.6* 10.8*  HCT 36.3 32.4* 33.1*  MCV 91.0 90.8 91.9  MCH 29.8 29.7 30.0  MCHC 32.8 32.7 32.6  RDW 13.2 13.4 13.4  PLT 159 149* 150    Cardiac EnzymesNo results for input(s): TROPONINI in the last 168 hours. No results for input(s): TROPIPOC in the last 168 hours.   BNPNo results for input(s): BNP, PROBNP in the last 168 hours.   DDimer No results for input(s): DDIMER in the last 168 hours.   Radiology    No results found.  Telemetry    08/25/19 NSR with frequent PACs HR 70's - Personally Reviewed  ECG    No new tracing as of 08/25/19- Personally Reviewed  Cardiac Studies   Echocardiogram: Pending   Patient Profile     86 y.o. female with a history of CAD s/p CABG x4 in 2004, subdural hematoma in 03/2019 s/p burr hole evaluation, hypertension, hyperlipidemia, pre-diabetes, blindness, and breast cancer who is being seen today for the evaluation of bradycardia at the request of [employer], PA-C.  Assessment & Plan    1. Sinus Bradycardia: -Cardiology consulted for intermittent bradycardia and  irregular heart rhythm.  -EKG showed normal sinus rhythm with bigeminy PACs. Rates in the 60's.  -Telemetry shows sinus rhythm with rates ranging from mid 40's to 70's with PACs and PVCs.  -Echocardiogram performed however results pending -Bradycardia not felt to be etiology of her fall. -Continue to monitor on telemetry. If no significant arrhythmias noted on telemetry, may consider outpatient monitor at discharge.  2. Fall with Multiple Injuries -Patient admitted after falling down the steps and suffering multiple injuries.  -Echo pending  -Continue to monitor on telemetry.  3. Atypical Chest Pain with Known CAD s/p CABG: -History of CAD s/p CABG in 2004. Most recent Myoview in 2016 was low risk with no ischemia.  -Patient reported intermittent left sided sharp chest pain a couple of weeks ago>>no recurrence  -EKG with no acute ischemic changes.  -Pending echo  -Denies recurrence   4. Hypertension: -Stable, 130/66>142/58>147/63 -Continue Amlodipine, Irbesartan for now.  -Will defer management to primary team and Neuro given SDH.   5. Hyperlipidemia: -Continue home statin  Otherwise, per primary team: - Acute on chronic right posterior parietal SDH - Right frontal acute SDH - T2 vertebral fracture - Left anterior 3-4 rib fractures - Left clavicle fracture - Left wrist Colles fracture - Acute on chronic anemia - History of breast cancer   Signed, Kathyrn Drown NP-C [place] Pager: [phone] 08/25/2019, 11:24 AM     For questions or updates, please contact   Please consult [email or web address] for contact info under Cardiology/STEMI.  Attending Note:   The patient was seen and examined.  Agree with assessment and plan as noted above.  Changes made to the above note as needed.  Patient seen and independently examined with  Kathyrn Drown, NP .   We discussed all aspects of the encounter. I agree with the assessment and plan as stated above.  1.   Bradycardia :   Better after stopping the coreg.   HR is now 70s  2.  Possible orthostatic hypotension:   Should improved now that she is off the coreg.   She has been eating lots of salty foods at home and is on amlodipine 10 mg  a day  Will reduce her dose to 5 mg a day  I would have a low threshold to DC the amlodipine if her BP remains low while she is here in the hospital    I have spent a total of 40 minutes with patient reviewing hospital  notes , telemetry, EKGs, labs and examining patient as well as establishing an assessment and plan that was discussed with the patient. > 50% of time was spent in direct patient care.    Thayer Headings, Brooke Bonito., MD, [facility] 08/25/2019, 12:41 PM [address],  [place] Pager [phone]

## 2019-08-25 NOTE — Evaluation (Signed)
Physical Therapy Evaluation Patient Details Name: Olivia Gray MRN: [id number] DOB: [birth year] Today's Date: 08/25/2019   History of Present Illness  86 yo female with baseline blindness who presents as a level 2 trauma after apparently falling down about a half-flight of stairs. Injuries include acute on chronic R posterior parietal SDH, R frontal acute SDH, T2 vertebral body fx w/ mass effect on spinal canal, L 3-4 rib fxs, L clavicle fx, L wrist colles fx.  Clinical Impression  Pt presents to PT with deficits in functional mobility, gait, balance, cognition, safety awareness. Pt with increased drift during ambulation, possibly due to foreign environment and visual deficits. No family present during session making difficult to compare to baseline. When ambulating in a straight line pt's lateral drift decreases, however pt does require guidance for direction due to visual deficits for all turns this session. Pt will continue to benefit from aggressive mobilization and PT POC to aide in a return to independence. Pt will benefit from assessment of gait with cane, stair negotiation training, and reinforcement of need of 24/7 supervision for a return home. Pt also currently recommending home health PT at time of discharge. If patient is unable to have 24/7 supervision PT would then recommend SNF rehab due to falls risk.    Follow Up Recommendations Home health PT;Supervision/Assistance - 24 hour    Equipment Recommendations  Cane(pt may benefit from use of cane for balance)    Recommendations for Other Services       Precautions / Restrictions Precautions Precautions: Fall Precaution Comments: blind Required Braces or Orthoses: Sling Restrictions Weight Bearing Restrictions: Yes LUE Weight Bearing: Non weight bearing      Mobility  Bed Mobility                  Transfers Overall transfer level: Needs assistance   Transfers: Sit to/from Stand Sit to Stand: Min guard             Ambulation/Gait Ambulation/Gait assistance: Min assist Gait Distance (Feet): 150 Feet Assistive device: 1 person hand held assist(intermittent PRN hand hold) Gait Pattern/deviations: Step-through pattern;Drifts right/left Gait velocity: functional Gait velocity interpretation: 1.31 - 2.62 ft/sec, indicative of limited community ambulator General Gait Details: OT providing verbal cues for direction as well as intermittent hand hold intiailly and at end of ambulation. Pt with increased drift left to right during session, 2-3 minor LOB requiring mina to correct  Stairs            Wheelchair Mobility    Modified Rankin (Stroke Patients Only)       Balance Overall balance assessment: Needs assistance Sitting-balance support: No upper extremity supported;Feet supported Sitting balance-Leahy Scale: Good Sitting balance - Comments: supervision   Standing balance support: No upper extremity supported;During functional activity Standing balance-Leahy Scale: Fair Standing balance comment: minG at sink brushing teeth                             Pertinent Vitals/Pain Pain Assessment: Faces Faces Pain Scale: Hurts a little bit Pain Location: L arm Pain Descriptors / Indicators: Guarding Pain Intervention(s): Monitored during session    Home Living Family/patient expects to be discharged to:: Private residence Living Arrangements: Spouse/significant other Available Help at Discharge: Family;Available 24 hours/day Type of Home: House Home Access: Stairs to enter Entrance Stairs-Rails: None Entrance Stairs-Number of Steps: 1 Home Layout: Two level Home Equipment: None      Prior Function Level of Independence:  Independent         Comments: pt does report multiple falls, last one 3 months ago     Hand Dominance        Extremity/Trunk Assessment   Upper Extremity Assessment Upper Extremity Assessment: Defer to OT evaluation    Lower Extremity  Assessment Lower Extremity Assessment: RLE deficits/detail;LLE deficits/detail RLE Deficits / Details: pt reporting intermittent tingling in BLE from toes all the way up LE LLE Deficits / Details: pt reporting intermittent tingling in BLE from toes all the way up LE    Cervical / Trunk Assessment Cervical / Trunk Assessment: Kyphotic  Communication   Communication: No difficulties  Cognition Arousal/Alertness: Awake/alert Behavior During Therapy: WFL for tasks assessed/performed Overall Cognitive Status: No family/caregiver present to determine baseline cognitive functioning Area of Impairment: Memory                     Memory: Decreased short-term memory         General Comments: pt reports not brushing her teeth since being hospitalized however tooth brush is wet when found in bathroom      General Comments General comments (skin integrity, edema, etc.): HR steady, 80s at rest and 90s with ambulation and activity. BP of 87/51 with patient reporting fatigue and lightheadedness during ambulation, recovers to 110/59 with seated rest    Exercises     Assessment/Plan    PT Assessment Patient needs continued PT services  PT Problem List Decreased balance;Decreased activity tolerance;Decreased mobility;Decreased cognition;Decreased safety awareness;Decreased knowledge of precautions;Impaired sensation       PT Treatment Interventions DME instruction;Gait training;Stair training;Functional mobility training;Therapeutic activities;Therapeutic exercise;Balance training;Neuromuscular re-education;Patient/family education    PT Goals (Current goals can be found in the Care Plan section)  Acute Rehab PT Goals Patient Stated Goal: To go home PT Goal Formulation: With patient Time For Goal Achievement: 09/08/19 Potential to Achieve Goals: Good Additional Goals Additional Goal #1: Pt will maintain dynamic standing balance within 10 inches of her base of support without UE  support and with supervision    Frequency Min 4X/week   Barriers to discharge        Co-evaluation PT/OT/SLP Co-Evaluation/Treatment: Yes Reason for Co-Treatment: Complexity of the patient's impairments (multi-system involvement);For patient/therapist safety;Necessary to address cognition/behavior during functional activity;To address functional/ADL transfers PT goals addressed during session: Mobility/safety with mobility;Balance;Strengthening/ROM OT goals addressed during session: ADL's and self-care;Strengthening/ROM;Proper use of Adaptive equipment and DME       AM-PAC PT "6 Clicks" Mobility  Outcome Measure Help needed turning from your back to your side while in a flat bed without using bedrails?: A Little Help needed moving from lying on your back to sitting on the side of a flat bed without using bedrails?: A Lot Help needed moving to and from a bed to a chair (including a wheelchair)?: A Little Help needed standing up from a chair using your arms (e.g., wheelchair or bedside chair)?: A Little Help needed to walk in hospital room?: A Little Help needed climbing 3-5 steps with a railing? : A Lot 6 Click Score: 16    End of Session   Activity Tolerance: Treatment limited secondary to medical complications (Comment)(orthostatic) Patient left: in chair;with call bell/phone within reach;with chair alarm set Nurse Communication: Mobility status PT Visit Diagnosis: Unsteadiness on feet (R26.81);History of falling (Z91.81)    Time: [id number] PT Time Calculation (min) (ACUTE ONLY): 23 min   Charges:   PT Evaluation $PT Eval Moderate Complexity: 1 Mod  Zenaida Niece, PT, DPT Acute Rehabilitation Pager: [phone]   Zenaida Niece 08/25/2019, 1:07 PM

## 2019-08-25 NOTE — Progress Notes (Signed)
[facility] Progress Note     Subjective: CC-  States that she is feeling much better today. HR 40's-80's. Seen by cardiology yesterday, planning for ECHO today. Did not eat much yesterday but states that her appetite is back today. Has a mild headache, otherwise no complaints.  Objective: Vital signs in last 24 hours: Temp:  [97.6 F (36.4 C)-98.6 F (37 C)] 98.6 F (37 C) (04/30 0831) Pulse Rate:  [40-105] 69 (04/30 0831) Resp:  [17-21] 20 (04/30 0831) BP: (113-147)/(43-66) 130/66 (04/30 0831) SpO2:  [98 %-100 %] 98 % (04/30 0831)    Intake/Output from previous day: 04/29 0701 - 04/30 0700 In: 500 [P.O.:500] Out: 200 [Urine:200] Intake/Output this shift: No intake/output data recorded.  PE: Gen:  Alert, NAD, pleasant HEENT: pupils equal and round, periorbital ecchymosis improving Card:  Intermittent bradycardia, 2+ DP pulses Pulm:  CTAB, no W/R/R, rate and effort normal Abd: Soft, NT/ND, +BS Ext:  no BLE edema, calves soft and nontender, splint and sling to LUE/ fingers WWP without edema Psych: A&Ox3 Neuro: f/c, moving all 4 extremities Skin: no rashes noted, warm and dry   Lab Results:  Recent Labs    08/24/19 0908 08/25/19 0654  WBC 4.1 3.6*  HGB 10.6* 10.8*  HCT 32.4* 33.1*  PLT 149* 150   BMET Recent Labs    08/24/19 0908 08/25/19 0654  NA 140 139  K 3.7 4.1  CL 108 110  CO2 23 23  GLUCOSE 106* 105*  BUN 15 14  CREATININE 0.92 0.85  CALCIUM 8.6* 8.6*   PT/INR Recent Labs    08/22/19 1808  LABPROT 13.4  INR 1.1   CMP     Component Value Date/Time   NA 139 08/25/2019 0654   K 4.1 08/25/2019 0654   CL 110 08/25/2019 0654   CO2 23 08/25/2019 0654   GLUCOSE 105 (H) 08/25/2019 0654   BUN 14 08/25/2019 0654   CREATININE 0.85 08/25/2019 0654   CALCIUM 8.6 (L) 08/25/2019 0654   PROT 7.1 08/22/2019 1808   ALBUMIN 3.7 08/22/2019 1808   AST 24 08/22/2019 1808   ALT 20 08/22/2019 1808   ALKPHOS 78 08/22/2019 1808   BILITOT  1.3 (H) 08/22/2019 1808   GFRNONAA >60 08/25/2019 0654   GFRAA >60 08/25/2019 0654   Lipase  No results found for: LIPASE     Studies/Results: DG Clavicle Left  Result Date: 08/23/2019 CLINICAL DATA:  Injury EXAM: LEFT CLAVICLE - 2+ VIEWS COMPARISON:  Chest CT August 22, 2019 FINDINGS: Frontal and angled frontal views obtained. The fracture of the proximal left clavicle noted on CT is less well appreciable by radiography. The medial left clavicle is not well seen but appears to be displaced somewhat superiorly with respect to the remainder of the left clavicle. No other fracture evident. There is acromioclavicular separation on the left. Noah vert dislocation evident. There is osteoarthritic change in the left glenohumeral joint. Bones are osteoporotic. Visualized left lung clear. IMPRESSION: The fracture of the medial left clavicle is not well visualized by radiography. It is better seen on CT. There appears to be superior displacement of the medial left clavicle with respect to the remainder of the bone. No other fracture evident. There is acromioclavicular separation. No frank dislocation evident. Osteoarthritic change noted in the left glenohumeral joint. Underlying osteoporosis noted. Electronically Signed   By: Lowella Grip III M.D.   On: 08/23/2019 10:45   CT HEAD WO CONTRAST  Result Date: 08/23/2019 CLINICAL DATA:  Follow-up  subdural hemorrhage EXAM: CT HEAD WITHOUT CONTRAST TECHNIQUE: Contiguous axial images were obtained from the base of the skull through the vertex without intravenous contrast. COMPARISON:  Yesterday FINDINGS: Brain: Encephalomalacia at the anterior right insula and frontal operculum. Mixed density subdural collection on the right . Maximal thickness is at the low-density component and is 4 mm, mildly decreased. No left-sided collection is seen. Small volume subarachnoid hemorrhage at the right ambient cistern. No acute infarct or hydrocephalus. Vascular: No  hyperdense vessel or unexpected calcification. Skull: More generalized scalp swelling. Negative for fracture. Bilateral parietal burr hole. Sinuses/Orbits: Negative IMPRESSION: 1. Slight decrease in the thin subdural hematoma on the right. 2. Minimal subarachnoid hemorrhage at the right ambient cistern, non progressed Electronically Signed   By: Monte Fantasia M.D.   On: 08/23/2019 10:20    Anti-infectives: Anti-infectives (From admission, onward)   None       Assessment/Plan Fall down stairs  Acute on chronic right posterior parietal SDH, right frontal acute SDH- NSGY c/s (Dr. Zada Finders), keppra x7d for sz ppx, okay for DVT ppx 4/29, hold ASA81mg  x1 week Scalp hematoma- monitor clinically T2 vertebral body fracture- NSGY c/s as above, okay to d/c c-collar Left anterior 3-4 rib fractures- pain control, pulm toilet/IS Left clavicle fracture- per Dr. Mardelle Matte, nonop, NWB LUE in sling Left wrist Colles fracture- per Dr. Amedeo Plenty, nonop, NWB LUE in splint, plan to transition into cast next week Acute on chronic anemia - Hgb 10.8 from 10.6, stable Bradycardia - EKG shows normal sinus rhythm with bigeminy PACs. Cardiology consult, ECHO today. Hold coreg S/p CABG 2004, CAD - on coreg (held), norvasc, irbesartan, zocor HTN HLD Hx breast cancer s/p lumpectomy and chemo/radiation - on letrozole Blindness  ID - none FEN- regular diet, MIVF off, bowel regimen VTE- SCDs, start LMWH 4/29 Foley - none Follow up - NS, Hilton Sinclair, PCP  Dispo- ECHO today. PT/OT.    LOS: 3 days    [facility] 08/25/2019, 8:35 AM Please see Amion for pager number during day hours 7:00am-4:30pm

## 2019-08-25 NOTE — Progress Notes (Signed)
Echocardiogram 2D Echocardiogram has been performed.  Oneal Deputy Tyrone Pautsch 08/25/2019, 10:12 AM

## 2019-08-26 DIAGNOSIS — I499 Cardiac arrhythmia, unspecified: Secondary | ICD-10-CM

## 2019-08-26 LAB — CBC
HCT: 31.9 % — ABNORMAL LOW (ref 36.0–46.0)
Hemoglobin: 10.4 g/dL — ABNORMAL LOW (ref 12.0–15.0)
MCH: 29.3 pg (ref 26.0–34.0)
MCHC: 32.6 g/dL (ref 30.0–36.0)
MCV: 89.9 fL (ref 80.0–100.0)
Platelets: 160 10*3/uL (ref 150–400)
RBC: 3.55 MIL/uL — ABNORMAL LOW (ref 3.87–5.11)
RDW: 13.2 % (ref 11.5–15.5)
WBC: 4.4 10*3/uL (ref 4.0–10.5)
nRBC: 0 % (ref 0.0–0.2)

## 2019-08-26 LAB — BASIC METABOLIC PANEL
Anion gap: 9 (ref 5–15)
BUN: 17 mg/dL (ref 8–23)
CO2: 22 mmol/L (ref 22–32)
Calcium: 8.7 mg/dL — ABNORMAL LOW (ref 8.9–10.3)
Chloride: 109 mmol/L (ref 98–111)
Creatinine, Ser: 0.76 mg/dL (ref 0.44–1.00)
GFR calc Af Amer: >60 mL/min (ref >60)
GFR calc non Af Amer: >60 mL/min (ref >60)
Glucose, Bld: 99 mg/dL (ref 70–99)
Potassium: 3.9 mmol/L (ref 3.5–5.1)
Sodium: 140 mmol/L (ref 135–145)

## 2019-08-26 MED ORDER — LEVETIRACETAM 500 MG PO TABS: 500.0000 mg | ORAL_TABLET | Freq: Two times a day (BID) | ORAL | 0 refills | Status: DC

## 2019-08-26 MED ORDER — ACETAMINOPHEN 325 MG PO TABS: 650.0000 mg | ORAL_TABLET | Freq: Four times a day (QID) | ORAL | Status: DC

## 2019-08-26 MED ORDER — DOCUSATE SODIUM 100 MG PO CAPS: 100.0000 mg | ORAL_CAPSULE | Freq: Two times a day (BID) | ORAL | 0 refills | Status: DC

## 2019-08-26 MED ORDER — ASPIRIN 81 MG PO CHEW: 81.0000 mg | CHEWABLE_TABLET | Freq: Every day | ORAL | Status: AC

## 2019-08-26 MED ORDER — AMLODIPINE BESYLATE 10 MG PO TABS: 5.0000 mg | ORAL_TABLET | Freq: Every day | ORAL | Status: DC

## 2019-08-26 MED ORDER — OXYCODONE HCL 5 MG PO TABS: 2.5000 mg | ORAL_TABLET | Freq: Four times a day (QID) | ORAL | 0 refills | Status: DC | PRN

## 2019-08-26 MED ORDER — POLYETHYLENE GLYCOL 3350 17 G PO PACK: 17.0000 g | PACK | Freq: Every day | ORAL | 0 refills | Status: DC | PRN

## 2019-08-26 NOTE — Progress Notes (Signed)
Progress Note  Patient Name: Olivia Gray Date of Encounter: 08/26/2019  Primary Cardiologist:   Glenetta Hew, MD   Subjective   No chest pain. No palpitations.   Inpatient Medications    Scheduled Meds: . acetaminophen  650 mg Oral Q6H  . amLODipine  10 mg Oral Daily  . Chlorhexidine Gluconate Cloth  6 each Topical Q0600  . docusate sodium  100 mg Oral BID  . enoxaparin (LOVENOX) injection  30 mg Subcutaneous Q12H  . escitalopram  10 mg Oral Daily  . feeding supplement (ENSURE ENLIVE)  237 mL Oral TID BM  . ipratropium  2 spray Each Nare TID  . irbesartan  300 mg Oral Daily  . letrozole  2.5 mg Oral Daily  . levETIRAcetam  500 mg Oral BID  . simvastatin  10 mg Oral Daily   Continuous Infusions:  PRN Meds: methocarbamol, morphine injection, ondansetron **OR** ondansetron (ZOFRAN) IV, oxyCODONE, polyethylene glycol   Vital Signs    Vitals:   08/25/19 1955 08/26/19 0000 08/26/19 0400 08/26/19 0830  BP: 134/72 (!) 133/50 (!) 155/49 (!) 123/54  Pulse: 62 65 (!) 52 (!) 52  Resp: 19 16 15 16   Temp: 98 F (36.7 C) 98.2 F (36.8 C) 98 F (36.7 C) 98.4 F (36.9 C)  TempSrc: Oral Oral Oral Oral  SpO2: 99% 98% 98% 98%  Weight:      Height:        Intake/Output Summary (Last 24 hours) at 08/26/2019 1204 Last data filed at 08/25/2019 2000 Gross per 24 hour  Intake 75 ml  Output --  Net 75 ml   Filed Weights   08/22/19 1738 08/23/19 0600  Weight: 60.8 kg 60.5 kg    Telemetry    NSR, SB, brief run of SVT, PACs- Personally Reviewed  ECG    NA - Personally Reviewed  Physical Exam   GEN: No acute distress.   Neck: No JVD Cardiac: RRR, no murmurs, rubs, or gallops.  Respiratory: Clear  to auscultation bilaterally. GI: Soft, nontender, non-distended  MS: No  edema; No deformity. Neuro:  Nonfocal  Psych: Normal affect   Labs    Chemistry Recent Labs  Lab 08/22/19 1808 08/23/19 0726 08/24/19 0908 08/25/19 0654 08/26/19 0304  NA 141   < > 140 139  140  K 4.5   < > 3.7 4.1 3.9  CL 110   < > 108 110 109  CO2 24   < > 23 23 22   GLUCOSE 121*   < > 106* 105* 99  BUN 21   < > 15 14 17   CREATININE 0.92   < > 0.92 0.85 0.76  CALCIUM 9.4   < > 8.6* 8.6* 8.7*  PROT 7.1  --   --   --   --   ALBUMIN 3.7  --   --   --   --   AST 24  --   --   --   --   ALT 20  --   --   --   --   ALKPHOS 78  --   --   --   --   BILITOT 1.3*  --   --   --   --   GFRNONAA NOT CALCULATED   < > 57* >60 >60  GFRAA NOT CALCULATED   < > >60 >60 >60  ANIONGAP 7   < > 9 6 9    < > = values in this interval not displayed.  Hematology Recent Labs  Lab 08/24/19 0908 08/25/19 0654 08/26/19 0304  WBC 4.1 3.6* 4.4  RBC 3.57* 3.60* 3.55*  HGB 10.6* 10.8* 10.4*  HCT 32.4* 33.1* 31.9*  MCV 90.8 91.9 89.9  MCH 29.7 30.0 29.3  MCHC 32.7 32.6 32.6  RDW 13.4 13.4 13.2  PLT 149* 150 160    Cardiac EnzymesNo results for input(s): TROPONINI in the last 168 hours. No results for input(s): TROPIPOC in the last 168 hours.   BNPNo results for input(s): BNP, PROBNP in the last 168 hours.   DDimer No results for input(s): DDIMER in the last 168 hours.   Radiology    ECHOCARDIOGRAM COMPLETE  Result Date: 08/25/2019    ECHOCARDIOGRAM REPORT   Patient Name:   Olivia Gray Date of Exam: 08/25/2019 Medical Rec #:  [id number]   Height:       64.0 in Accession #:    [id number]  Weight:       133.4 lb Date of Birth:  [birth year]    BSA:          1.647 m Patient Age:    86 years    BP:           130/66 mmHg Patient Gender: F           HR:           68 bpm. Exam Location:  Inpatient Procedure: 2D Echo, Color Doppler, Cardiac Doppler and Intracardiac            Opacification Agent Indications:    R94.31 Abnormal EKG  History:        Patient has prior history of Echocardiogram examinations, most                 recent 07/31/2013. Prior CABG; Risk Factors:Hypertension and                 Dyslipidemia.  Sonographer:    Raquel Sarna Senior RDCS Referring Phys: [id number] Darreld Gray  Sonographer  Comments: Technically difficult due to patient body habitus and multiple spine and rib fractures. IMPRESSIONS  1. Left ventricular ejection fraction, by estimation, is 55 to 60%. The left ventricle has normal function. The left ventricle has no regional wall motion abnormalities. Left ventricular diastolic parameters were normal.  2. Right ventricular systolic function is normal. The right ventricular size is normal.  3. Left atrial size was moderately dilated.  4. The mitral valve is normal in structure. Trivial mitral valve regurgitation. No evidence of mitral stenosis.  5. The aortic valve is tricuspid. Aortic valve regurgitation is mild. Mild to moderate aortic valve sclerosis/calcification is present, without any evidence of aortic stenosis.  6. The inferior vena cava is normal in size with greater than 50% respiratory variability, suggesting right atrial pressure of 3 mmHg. FINDINGS  Left Ventricle: Left ventricular ejection fraction, by estimation, is 55 to 60%. The left ventricle has normal function. The left ventricle has no regional wall motion abnormalities. Definity contrast agent was given IV to delineate the left ventricular  endocardial borders. The left ventricular internal cavity size was normal in size. There is no left ventricular hypertrophy. Left ventricular diastolic parameters were normal. Right Ventricle: The right ventricular size is normal. No increase in right ventricular wall thickness. Right ventricular systolic function is normal. Left Atrium: Left atrial size was moderately dilated. Right Atrium: Right atrial size was normal in size. Pericardium: There is no evidence of pericardial effusion. Mitral Valve: The mitral valve is normal in structure.  There is mild thickening of the mitral valve leaflet(s). There is mild calcification of the mitral valve leaflet(s). Normal mobility of the mitral valve leaflets. Moderate mitral annular calcification. Trivial mitral valve regurgitation. No  evidence of mitral valve stenosis. Tricuspid Valve: The tricuspid valve is normal in structure. Tricuspid valve regurgitation is mild . No evidence of tricuspid stenosis. Aortic Valve: The aortic valve is tricuspid. Aortic valve regurgitation is mild. Mild to moderate aortic valve sclerosis/calcification is present, without any evidence of aortic stenosis. Pulmonic Valve: The pulmonic valve was normal in structure. Pulmonic valve regurgitation is not visualized. No evidence of pulmonic stenosis. Aorta: The aortic root is normal in size and structure. Venous: The inferior vena cava is normal in size with greater than 50% respiratory variability, suggesting right atrial pressure of 3 mmHg. IAS/Shunts: No atrial level shunt detected by color flow Doppler.  LEFT VENTRICLE PLAX 2D LVIDd:         3.50 cm LVIDs:         2.80 cm LV PW:         1.10 cm LV IVS:        1.00 cm LVOT diam:     1.90 cm LV SV:         44 LV SV Index:   27 LVOT Area:     2.84 cm  RIGHT VENTRICLE RV S prime:     7.83 cm/s TAPSE (M-mode): 1.3 cm LEFT ATRIUM           Index       RIGHT ATRIUM           Index LA diam:      4.30 cm 2.61 cm/m  RA Area:     20.30 cm LA Vol (A4C): 73.0 ml 44.32 ml/m RA Volume:   59.60 ml  36.19 ml/m  AORTIC VALVE LVOT Vmax:   82.25 cm/s LVOT Vmean:  54.050 cm/s LVOT VTI:    0.156 m  AORTA Ao Root diam: 3.00 cm  SHUNTS Systemic VTI:  0.16 m Systemic Diam: 1.90 cm Jenkins Rouge MD Electronically signed by Jenkins Rouge MD Signature Date/Time: 08/25/2019/11:37:25 AM    Final     Cardiac Studies   ECHO:  1. Left ventricular ejection fraction, by estimation, is 55 to 60%. The  left ventricle has normal function. The left ventricle has no regional  wall motion abnormalities. Left ventricular diastolic parameters were  normal.  2. Right ventricular systolic function is normal. The right ventricular  size is normal.  3. Left atrial size was moderately dilated.  4. The mitral valve is normal in structure.  Trivial mitral valve  regurgitation. No evidence of mitral stenosis.  5. The aortic valve is tricuspid. Aortic valve regurgitation is mild.  Mild to moderate aortic valve sclerosis/calcification is present, without  any evidence of aortic stenosis.  6. The inferior vena cava is normal in size with greater than 50%  respiratory variability, suggesting right atrial pressure of 3 mmHg.    Patient Profile     86 y.o. female with a historyof CAD s/p CABG x4 in 2004,subdural hematoma in 03/2019 s/p burr hole evaluation,hypertension, hyperlipidemia, pre-diabetes, blindness, and breast cancerwho is being seen for the evaluation ofbradycardiaat the request of [employer], PA-C.  Assessment & Plan    Bradycardia:   Coreg stopped.  Tele with findings as above but no sustained or symptomatic arrhythmias.   Ok to discharge on meds as listed.   I discussed with the patient and the  husband.  I will ask our team to send her a Zio patch and follow up will be scheduled in our office.  We will have to call her with these details.     Atypical chest pain:    No further work up.    For questions or updates, please contact [place] Please consult [email or web address] for contact info under Cardiology/STEMI.   Signed, Minus Breeding, MD  08/26/2019, 12:04 PM

## 2019-08-26 NOTE — Discharge Instructions (Signed)
Concussion, Adult  A concussion is a brain injury from a hard, direct hit (trauma) to your head or body. This direct hit causes the brain to quickly shake back and forth inside the skull. A concussion may also be called a mild traumatic brain injury (TBI). Healing from this injury can take time. What are the causes? This condition is caused by:  A direct hit to your head, such as: ? Running into a player during a game. ? Being hit in a fight. ? Hitting your head on a hard surface.  A quick and sudden movement (jolt) of the head or neck, such as in a car crash. What are the signs or symptoms? The signs of a concussion can be hard to notice. They may be missed by you, family members, and doctors. You may look fine on the outside but may not act or feel normal. Physical symptoms  Headaches.  Being tired (fatigued).  Being dizzy.  Problems with body balance.  Problems seeing or hearing.  Being sensitive to light or noise.  Feeling sick to your stomach (nausea) or throwing up (vomiting).  Not sleeping or eating as you used to.  Loss of feeling (numbness) or tingling in the body.  Seizure. Mental and emotional symptoms  Problems remembering things.  Trouble focusing your mind (concentrating), organizing, or making decisions.  Being slow to think, act, react, speak, or read.  Feeling grouchy (irritable).  Having mood changes.  Feeling worried or nervous (anxious).  Feeling sad (depressed). How is this treated? This condition may be treated by:  Stopping sports or activity if you are injured. If you hit your head or have signs of concussion: ? Do not return to sports or activities the same day. ? Get checked by a doctor before you return to your activities.  Resting your body and your mind.  Being watched carefully, often at home.  Medicines to help with symptoms such as: ? Feeling sick to your stomach. ? Headaches. ? Problems with sleep.  Avoid taking strong  pain medicines (opioids) for a concussion.  Avoiding alcohol and drugs.  Being asked to go to a concussion clinic or a place to help you recover (rehabilitation center). Recovery from a concussion can take time. Return to activities only:  When you are fully healed.  When your doctor says it is safe. Follow these instructions at home: Activity  Limit activities that need a lot of thought or focus, such as: ? Homework or work for your job. ? Watching TV. ? Using the computer or phone. ? Playing memory games and puzzles.  Rest. Rest helps your brain heal. Make sure you: ? Get plenty of sleep. Most adults should get 7-9 hours of sleep each night. ? Rest during the day. Take naps or breaks when you feel tired.  Avoid activity like exercise until your doctor says its safe. Stop any activity that makes symptoms worse.  Do not do activities that could cause a second concussion, such as riding a bike or playing sports.  Ask your doctor when you can return to your normal activities, such as school, work, sports, and driving. Your ability to react may be slower. Do not do these activities if you are dizzy. General instructions   Take over-the-counter and prescription medicines only as told by your doctor.  Do not drink alcohol until your doctor says you can.  Watch your symptoms and tell other people to do the same. Other problems can occur after a concussion. Older   adults have a higher risk of serious problems.  Tell your work Freight forwarder, teachers, Government social research officer, school counselor, coach, or Product/process development scientist about your injury and symptoms. Tell them about what you can or cannot do.  Keep all follow-up visits as told by your doctor. This is important. How is this prevented?  It is very important that you do not get another brain injury. In rare cases, another injury can cause brain damage that will not go away, brain swelling, or death. The risk of this is greatest in the first 7-10 days  after a head injury. To avoid injuries: ? Stop activities that could lead to a second concussion, such as contact sports, until your doctor says it is okay. ? When you return to sports or activities:  Do not crash into other players. This is how most concussions happen.  Follow the rules.  Respect other players. ? Get regular exercise. Do strength and balance training. ? Wear a helmet that fits you well during sports, biking, or other activities.  Helmets can help protect you from serious skull and brain injuries, but they do not protect you from a concussion. Even when wearing a helmet, you should avoid being hit in the head. Contact a doctor if:  Your symptoms get worse or they do not get better.  You have new symptoms.  You have another injury. Get help right away if:  You have bad headaches or your headaches get worse.  You feel weak or numb in any part of your body.  You are mixed up (confused).  Your balance gets worse.  You keep throwing up.  You feel more sleepy than normal.  Your speech is not clear (is slurred).  You cannot recognize people or places.  You have a seizure.  Others have trouble waking you up.  You have behavior changes.  You have changes in how you see (vision).  You pass out (lose consciousness). Summary  A concussion is a brain injury from a hard, direct hit (trauma) to your head or body.  This condition is treated with rest and careful watching of symptoms.  If you keep having symptoms, call your doctor. This information is not intended to replace advice given to you by your health care provider. Make sure you discuss any questions you have with your health care provider. Document Revised: 12/02/2017 Document Reviewed: 12/02/2017 Elsevier Patient Education  2020 [place] or Splint Care, Adult Casts and splints are supports that are worn to protect broken bones and other injuries. A cast or splint may hold a bone  still and in the correct position while it heals. Casts and splints may also help to ease pain, swelling, and muscle spasms. How to care for your cast   Do not stick anything inside the cast to scratch your skin.  Check the skin around the cast every day. Tell your doctor about any concerns.  You may put lotion on dry skin around the edges of the cast. Do not put lotion on the skin under the cast.  Keep the cast clean.  If the cast is not waterproof: ? Do not let it get wet. ? Cover it with a watertight covering when you take a bath or a shower. How to care for your splint   Wear it as told by your doctor. Take it off only as told by your doctor.  Loosen the splint if your fingers or toes tingle, get numb, or turn cold and  blue.  Keep the splint clean.  If the splint is not waterproof: ? Do not let it get wet. ? Cover it with a watertight covering when you take a bath or a shower. Follow these instructions at home: Bathing  Do not take baths or swim until your doctor says it is okay. Ask your doctor if you can take showers. You may only be allowed to take sponge baths for bathing.  If your cast or splint is not waterproof, cover it with a watertight covering when you take a bath or shower. Managing pain, stiffness, and swelling  Move your fingers or toes often to avoid stiffness and to lessen swelling.  Raise (elevate) the injured area above the level of your heart while sitting or lying down. Safety  Do not use the injured limb to support your body weight until your doctor says that it is okay.  Use crutches or other assistive devices as told by your doctor. General instructions  Do not put pressure on any part of the cast or splint until it is fully hardened. This may take many hours.  Return to your normal activities as told by your doctor. Ask your doctor what activities are safe for you.  Keep all follow-up visits as told by your doctor. This is  important. Contact a doctor if:  Your cast or splint gets damaged.  The skin around the cast gets red or raw.  The skin under the cast is very itchy or painful.  Your cast or splint feels very uncomfortable.  Your cast or splint is too tight or too loose.  Your cast becomes wet or it starts to have a soft spot or area.  You get an object stuck under your cast. Get help right away if:  Your pain gets worse.  The injured area tingles, gets numb, or turns blue and cold.  The part of your body above or below the cast is swollen and it turns a different color (is discolored).  You cannot feel or move your fingers or toes.  There is fluid leaking through the cast.  You have very bad pain or pressure under the cast.  You have trouble breathing.  You have shortness of breath.  You have chest pain. This information is not intended to replace advice given to you by your health care provider. Make sure you discuss any questions you have with your health care provider. Document Revised: 08/03/2018 Document Reviewed: 04/03/2016 Elsevier Patient Education  [place].

## 2019-08-26 NOTE — Progress Notes (Signed)
Discharge instructions given. Patient and husband both verbalized understanding and all questions were answered.

## 2019-08-26 NOTE — TOC Transition Note (Signed)
Transition of Care [facility]) - CM/SW Discharge Note   Patient Details  Name: Kailia Devilla MRN: [id number] Date of Birth: [birth year]  Transition of Care [facility]) CM/SW Contact:  [name], RN 08/26/2019, 2:47 PM   Clinical Narrative:    Patient to dc home with [facility] PT/OT/SLP.  Discussed with patient and her husband.  [facility] chosen from [employer] rated list.  Referral accepted by [employer]    Patient not sure if she wants a cane.  DME order placed and printed so that husband can take to DME store to obtain if desired after discharge.    Final next level of care: [place] Barriers to Discharge: No Barriers Identified   Patient Goals and CMS Choice Patient states their goals for this hospitalization and ongoing recovery are:: to get home CMS Medicare.gov Compare Post Acute Care list provided to:: Patient Represenative (must comment)(spouse) Choice offered to / list presented to : Spouse   Discharge Plan and Services    HH Arranged: PT, OT, Speech Therapy HH Agency: [facility] Date [place]: 08/26/19 Time [place]: [id number] Representative spoke with at [place]: Tommi Rumps

## 2019-08-26 NOTE — Progress Notes (Signed)
[facility] Progress Note     Subjective: CC-  Feeling well today. Tolerating diet and had a BM yesterday. She reports mild headache. Denies n/v.  PT/OT/SLP recommending home health. HR improved off coreg, 47-82. Cardiology following and also decreased amlodipine to 5mg  daily yesterday.  Objective: Vital signs in last 24 hours: Temp:  [98 F (36.7 C)-98.6 F (37 C)] 98 F (36.7 C) (05/01 0400) Pulse Rate:  [47-82] 52 (05/01 0400) Resp:  [15-20] 15 (05/01 0400) BP: (110-155)/(47-72) 155/49 (05/01 0400) SpO2:  [98 %-100 %] 98 % (05/01 0400) Last BM Date: 08/25/19  Intake/Output from previous day: 04/30 0701 - 05/01 0700 In: 435 [P.O.:435] Out: -  Intake/Output this shift: No intake/output data recorded.  PE: Gen: Alert, NAD, pleasant HEENT: pupils equal and round, periorbital ecchymosis improving Card:regular rate/ irreg rhythm on tele, 2+ DP pulses Pulm: CTAB, no W/R/R, rate and effort normal Abd: Soft, NT/ND, +BS Ext: no BLE edema, calves soft and nontender, splint and sling to LUE/ fingers WWP without edema Psych: A&Ox3 Neuro: f/c, moving all 4 extremities Skin: no rashes noted, warm and dry   Lab Results:  Recent Labs    08/25/19 0654 08/26/19 0304  WBC 3.6* 4.4  HGB 10.8* 10.4*  HCT 33.1* 31.9*  PLT 150 160   BMET Recent Labs    08/25/19 0654 08/26/19 0304  NA 139 140  K 4.1 3.9  CL 110 109  CO2 23 22  GLUCOSE 105* 99  BUN 14 17  CREATININE 0.85 0.76  CALCIUM 8.6* 8.7*   PT/INR No results for input(s): LABPROT, INR in the last 72 hours. CMP     Component Value Date/Time   NA 140 08/26/2019 0304   K 3.9 08/26/2019 0304   CL 109 08/26/2019 0304   CO2 22 08/26/2019 0304   GLUCOSE 99 08/26/2019 0304   BUN 17 08/26/2019 0304   CREATININE 0.76 08/26/2019 0304   CALCIUM 8.7 (L) 08/26/2019 0304   PROT 7.1 08/22/2019 1808   ALBUMIN 3.7 08/22/2019 1808   AST 24 08/22/2019 1808   ALT 20 08/22/2019 1808   ALKPHOS 78 08/22/2019  1808   BILITOT 1.3 (H) 08/22/2019 1808   GFRNONAA >60 08/26/2019 0304   GFRAA >60 08/26/2019 0304   Lipase  No results found for: LIPASE     Studies/Results: ECHOCARDIOGRAM COMPLETE  Result Date: 08/25/2019    ECHOCARDIOGRAM REPORT   Patient Name:   Olivia Gray Date of Exam: 08/25/2019 Medical Rec #:  [id number]   Height:       64.0 in Accession #:    [id number]  Weight:       133.4 lb Date of Birth:  [birth year]    BSA:          1.647 m Patient Age:    86 years    BP:           130/66 mmHg Patient Gender: F           HR:           68 bpm. Exam Location:  Inpatient Procedure: 2D Echo, Color Doppler, Cardiac Doppler and Intracardiac            Opacification Agent Indications:    R94.31 Abnormal EKG  History:        Patient has prior history of Echocardiogram examinations, most                 recent 07/31/2013. Prior CABG; Risk Factors:Hypertension and  Dyslipidemia.  Sonographer:    Raquel Sarna Senior RDCS Referring Phys: [id number] Darreld Mclean  Sonographer Comments: Technically difficult due to patient body habitus and multiple spine and rib fractures. IMPRESSIONS  1. Left ventricular ejection fraction, by estimation, is 55 to 60%. The left ventricle has normal function. The left ventricle has no regional wall motion abnormalities. Left ventricular diastolic parameters were normal.  2. Right ventricular systolic function is normal. The right ventricular size is normal.  3. Left atrial size was moderately dilated.  4. The mitral valve is normal in structure. Trivial mitral valve regurgitation. No evidence of mitral stenosis.  5. The aortic valve is tricuspid. Aortic valve regurgitation is mild. Mild to moderate aortic valve sclerosis/calcification is present, without any evidence of aortic stenosis.  6. The inferior vena cava is normal in size with greater than 50% respiratory variability, suggesting right atrial pressure of 3 mmHg. FINDINGS  Left Ventricle: Left ventricular ejection fraction, by  estimation, is 55 to 60%. The left ventricle has normal function. The left ventricle has no regional wall motion abnormalities. Definity contrast agent was given IV to delineate the left ventricular  endocardial borders. The left ventricular internal cavity size was normal in size. There is no left ventricular hypertrophy. Left ventricular diastolic parameters were normal. Right Ventricle: The right ventricular size is normal. No increase in right ventricular wall thickness. Right ventricular systolic function is normal. Left Atrium: Left atrial size was moderately dilated. Right Atrium: Right atrial size was normal in size. Pericardium: There is no evidence of pericardial effusion. Mitral Valve: The mitral valve is normal in structure. There is mild thickening of the mitral valve leaflet(s). There is mild calcification of the mitral valve leaflet(s). Normal mobility of the mitral valve leaflets. Moderate mitral annular calcification. Trivial mitral valve regurgitation. No evidence of mitral valve stenosis. Tricuspid Valve: The tricuspid valve is normal in structure. Tricuspid valve regurgitation is mild . No evidence of tricuspid stenosis. Aortic Valve: The aortic valve is tricuspid. Aortic valve regurgitation is mild. Mild to moderate aortic valve sclerosis/calcification is present, without any evidence of aortic stenosis. Pulmonic Valve: The pulmonic valve was normal in structure. Pulmonic valve regurgitation is not visualized. No evidence of pulmonic stenosis. Aorta: The aortic root is normal in size and structure. Venous: The inferior vena cava is normal in size with greater than 50% respiratory variability, suggesting right atrial pressure of 3 mmHg. IAS/Shunts: No atrial level shunt detected by color flow Doppler.  LEFT VENTRICLE PLAX 2D LVIDd:         3.50 cm LVIDs:         2.80 cm LV PW:         1.10 cm LV IVS:        1.00 cm LVOT diam:     1.90 cm LV SV:         44 LV SV Index:   27 LVOT Area:     2.84 cm   RIGHT VENTRICLE RV S prime:     7.83 cm/s TAPSE (M-mode): 1.3 cm LEFT ATRIUM           Index       RIGHT ATRIUM           Index LA diam:      4.30 cm 2.61 cm/m  RA Area:     20.30 cm LA Vol (A4C): 73.0 ml 44.32 ml/m RA Volume:   59.60 ml  36.19 ml/m  AORTIC VALVE LVOT Vmax:   82.25 cm/s LVOT  Vmean:  54.050 cm/s LVOT VTI:    0.156 m  AORTA Ao Root diam: 3.00 cm  SHUNTS Systemic VTI:  0.16 m Systemic Diam: 1.90 cm Jenkins Rouge MD Electronically signed by Jenkins Rouge MD Signature Date/Time: 08/25/2019/11:37:25 AM    Final     Anti-infectives: Anti-infectives (From admission, onward)   None       Assessment/Plan Fall down stairs  Acute on chronic right posterior parietal SDH, right frontal acute SDH- NSGY c/s (Dr. Zada Finders), keppra x7d for sz ppx, okay for DVT ppx 4/29, hold ASA81mg  x1 week Scalp hematoma- monitor clinically T2 vertebral body fracture- NSGY c/s as above, okay to d/c c-collar Left anterior 3-4 rib fractures- pain control, pulm toilet/IS Left clavicle fracture-per Dr. Mardelle Matte, nonop, NWB LUE in sling Left wrist Colles fracture-per Dr. Amedeo Plenty, nonop, NWB LUE in splint, plan to transition into cast next week Acute on chronic anemia - Hgb 10.4, stable Bradycardia - EKG shows normal sinus rhythm with bigeminy PACs. Cardiology consult, ECHO ok. Holding coreg, decreased norvasc to 5mg  >> may consider stopping this medication if BP remains low during hospitalization S/p CABG 2004, CAD - on coreg (held), norvasc, irbesartan, zocor HTN HLD Hx breast cancer s/p lumpectomy and chemo/radiation - on letrozole Blindness  ID - none FEN- regular diet, MIVF off, bowel regimen VTE- SCDs, start LMWH 4/29 Foley - none Follow up - NS, Hilton Sinclair, PCP  Dispo-Continue therapies. Follow up cardiology recommendations today. HH PT/OT/SLP and DME have been ordered. May be ready for discharge later today vs tomorrow. I will send rx's to pharmacy. I called and spoke with the  patient's husband.   LOS: 4 days    Wellington Hampshire, [facility] 08/26/2019, 8:27 AM Please see Amion for pager number during day hours 7:00am-4:30pm

## 2019-08-26 NOTE — Plan of Care (Signed)
  Problem: Safety: Goal: Ability to remain free from injury will improve Outcome: Progressing   

## 2019-08-26 NOTE — Progress Notes (Signed)
Physical Therapy Treatment Patient Details Name: Olivia Gray MRN: [id number] DOB: [birth year] Today's Date: 08/26/2019    History of Present Illness 86 yo female with baseline blindness who presents as a level 2 trauma after apparently falling down about a half-flight of stairs. Injuries include acute on chronic R posterior parietal SDH, R frontal acute SDH, T2 vertebral body fx w/ mass effect on spinal canal, L 3-4 rib fxs, L clavicle fx, L wrist colles fx.    PT Comments    Patient seen for activity progression. Improving activity tolerance but with some instability. Hand held assist for guidance and safety due to visual impairments. Current POC remains appropriate.    Follow Up Recommendations  Home health PT;Supervision/Assistance - 24 hour     Equipment Recommendations  Cane(pt may benefit from use of cane for balance)    Recommendations for Other Services       Precautions / Restrictions Precautions Precautions: Fall Precaution Comments: blind Required Braces or Orthoses: Sling Restrictions Weight Bearing Restrictions: Yes LUE Weight Bearing: Non weight bearing    Mobility  Bed Mobility Overal bed mobility: Needs Assistance Bed Mobility: Supine to Sit     Supine to sit: Min assist     General bed mobility comments: min assist for support of LUE during transition to EOB  Transfers Overall transfer level: Needs assistance Equipment used: 1 person hand held assist(for guidance only) Transfers: Sit to/from Stand Sit to Stand: Min guard         General transfer comment: Min guard for safety and stability, initial posterior list  Ambulation/Gait Ambulation/Gait assistance: Min assist   Assistive device: 1 person hand held assist(intermittent PRN hand hold) Gait Pattern/deviations: Step-through pattern;Drifts right/left Gait velocity: functional   General Gait Details: Verbal and tactile guidance cues due to visual impariment   Stairs              Wheelchair Mobility    Modified Rankin (Stroke Patients Only)       Balance Overall balance assessment: Needs assistance Sitting-balance support: No upper extremity supported;Feet supported Sitting balance-Leahy Scale: Good Sitting balance - Comments: supervision   Standing balance support: No upper extremity supported;During functional activity Standing balance-Leahy Scale: Fair Standing balance comment: Min guard at sink for activities                            Cognition Arousal/Alertness: Awake/alert Behavior During Therapy: WFL for tasks assessed/performed Overall Cognitive Status: No family/caregiver present to determine baseline cognitive functioning Area of Impairment: Memory                     Memory: Decreased short-term memory         General Comments: pt reports not brushing her teeth since being hospitalized however tooth brush is wet when found in bathroom      Exercises      General Comments        Pertinent Vitals/Pain Pain Assessment: Faces Faces Pain Scale: Hurts a little bit Pain Location: L arm Pain Descriptors / Indicators: Guarding Pain Intervention(s): Monitored during session    Home Living                      Prior Function            PT Goals (current goals can now be found in the care plan section) Acute Rehab PT Goals Patient Stated Goal: To go home PT  Goal Formulation: With patient Time For Goal Achievement: 09/08/19 Potential to Achieve Goals: Good Progress towards PT goals: Progressing toward goals    Frequency    Min 4X/week      PT Plan Current plan remains appropriate    Co-evaluation              AM-PAC PT "6 Clicks" Mobility   Outcome Measure  Help needed turning from your back to your side while in a flat bed without using bedrails?: A Little Help needed moving from lying on your back to sitting on the side of a flat bed without using bedrails?: A Lot Help  needed moving to and from a bed to a chair (including a wheelchair)?: A Little Help needed standing up from a chair using your arms (e.g., wheelchair or bedside chair)?: A Little Help needed to walk in hospital room?: A Little Help needed climbing 3-5 steps with a railing? : A Lot 6 Click Score: 16    End of Session Equipment Utilized During Treatment: Gait belt Activity Tolerance: Patient tolerated treatment well Patient left: in chair;with call bell/phone within reach;with chair alarm set Nurse Communication: Mobility status PT Visit Diagnosis: Unsteadiness on feet (R26.81);History of falling (Z91.81)     Time: [id number] PT Time Calculation (min) (ACUTE ONLY): 22 min  Charges:  $Gait Training: 8-22 mins                     Alben Deeds, PT DPT  Board Certified Neurologic Specialist [place] Office [place] 08/26/2019, 9:50 AM

## 2019-08-26 NOTE — Discharge Summary (Signed)
[facility] Discharge Summary   Patient ID: Olivia Gray MRN: [id number] DOB/AGE: October 25, 1932 86 y.o.  Admit date: 08/22/2019 Discharge date: 08/26/2019  Admitting Diagnosis: Fall down stairs 1.  Acute on chronic right posterior parietal SDH 2.  Right frontal acute SDH 3.  Scalp hematoma 4.  T2 vertebral body fracture with some mass effect on spinal canal 5.  Left anterior 3-4 rib fractures 6.  Left clavicle fracture 7.  Left wrist Colles fracture  Discharge Diagnosis Fall down stairs Acute on chronic right posterior parietal SDH, right frontal acute SDH Scalp hematoma T2 vertebral body fracture Left anterior 3-4 rib fractures Left clavicle fracture Left wrist Colles fracture Acute on chronic anemia  Bradycardia S/p CABG 2004, CAD HTN HLD Hx breast cancer s/p lumpectomy and chemo/radiation - on letrozole Blindness  Consultants Cardiology Neurosurgery Orthopedics Hand  Imaging: ECHOCARDIOGRAM COMPLETE  Result Date: 08/25/2019    ECHOCARDIOGRAM REPORT   Patient Name:   JAYN CHRISTOPHE Date of Exam: 08/25/2019 Medical Rec #:  [id number]   Height:       64.0 in Accession #:    [id number]  Weight:       133.4 lb Date of Birth:  Jan 09, 1933    BSA:          1.647 m Patient Age:    53 years    BP:           130/66 mmHg Patient Gender: F           HR:           68 bpm. Exam Location:  Inpatient Procedure: 2D Echo, Color Doppler, Cardiac Doppler and Intracardiac            Opacification Agent Indications:    R94.31 Abnormal EKG  History:        Patient has prior history of Echocardiogram examinations, most                 recent 07/31/2013. Prior CABG; Risk Factors:Hypertension and                 Dyslipidemia.  Sonographer:    Raquel Sarna Senior RDCS Referring Phys: [id number] Darreld Mclean  Sonographer Comments: Technically difficult due to patient body habitus and multiple spine and rib fractures. IMPRESSIONS  1. Left ventricular ejection fraction, by estimation, is 55 to 60%. The  left ventricle has normal function. The left ventricle has no regional wall motion abnormalities. Left ventricular diastolic parameters were normal.  2. Right ventricular systolic function is normal. The right ventricular size is normal.  3. Left atrial size was moderately dilated.  4. The mitral valve is normal in structure. Trivial mitral valve regurgitation. No evidence of mitral stenosis.  5. The aortic valve is tricuspid. Aortic valve regurgitation is mild. Mild to moderate aortic valve sclerosis/calcification is present, without any evidence of aortic stenosis.  6. The inferior vena cava is normal in size with greater than 50% respiratory variability, suggesting right atrial pressure of 3 mmHg. FINDINGS  Left Ventricle: Left ventricular ejection fraction, by estimation, is 55 to 60%. The left ventricle has normal function. The left ventricle has no regional wall motion abnormalities. Definity contrast agent was given IV to delineate the left ventricular  endocardial borders. The left ventricular internal cavity size was normal in size. There is no left ventricular hypertrophy. Left ventricular diastolic parameters were normal. Right Ventricle: The right ventricular size is normal. No increase in right ventricular wall thickness. Right ventricular systolic function is normal.  Left Atrium: Left atrial size was moderately dilated. Right Atrium: Right atrial size was normal in size. Pericardium: There is no evidence of pericardial effusion. Mitral Valve: The mitral valve is normal in structure. There is mild thickening of the mitral valve leaflet(s). There is mild calcification of the mitral valve leaflet(s). Normal mobility of the mitral valve leaflets. Moderate mitral annular calcification. Trivial mitral valve regurgitation. No evidence of mitral valve stenosis. Tricuspid Valve: The tricuspid valve is normal in structure. Tricuspid valve regurgitation is mild . No evidence of tricuspid stenosis. Aortic Valve:  The aortic valve is tricuspid. Aortic valve regurgitation is mild. Mild to moderate aortic valve sclerosis/calcification is present, without any evidence of aortic stenosis. Pulmonic Valve: The pulmonic valve was normal in structure. Pulmonic valve regurgitation is not visualized. No evidence of pulmonic stenosis. Aorta: The aortic root is normal in size and structure. Venous: The inferior vena cava is normal in size with greater than 50% respiratory variability, suggesting right atrial pressure of 3 mmHg. IAS/Shunts: No atrial level shunt detected by color flow Doppler.  LEFT VENTRICLE PLAX 2D LVIDd:         3.50 cm LVIDs:         2.80 cm LV PW:         1.10 cm LV IVS:        1.00 cm LVOT diam:     1.90 cm LV SV:         44 LV SV Index:   27 LVOT Area:     2.84 cm  RIGHT VENTRICLE RV S prime:     7.83 cm/s TAPSE (M-mode): 1.3 cm LEFT ATRIUM           Index       RIGHT ATRIUM           Index LA diam:      4.30 cm 2.61 cm/m  RA Area:     20.30 cm LA Vol (A4C): 73.0 ml 44.32 ml/m RA Volume:   59.60 ml  36.19 ml/m  AORTIC VALVE LVOT Vmax:   82.25 cm/s LVOT Vmean:  54.050 cm/s LVOT VTI:    0.156 m  AORTA Ao Root diam: 3.00 cm  SHUNTS Systemic VTI:  0.16 m Systemic Diam: 1.90 cm Jenkins Rouge MD Electronically signed by Jenkins Rouge MD Signature Date/Time: 08/25/2019/11:37:25 AM    Final     Procedures None  Hospital Course:  Hubert Pietropaolo is an 86yo female with MMP including CAD s/p CABG 2004, HTN, HLD, and blindness who presented to [facility] as a level 2 trauma activation 4/27 after falling down about a half-flight of stairs.  Repetitive questioning.  Complaining of mid-back and left wrist pain with deformity.  No chest pain or SOB.  No abdominal pain.  Worked up by EDP and found to have Acute on chronic right posterior parietal SDH, right frontal acute SDH, Scalp hematoma, T2 vertebral body fracture, Left anterior 3-4 rib fractures, Left clavicle fracture, and Left wrist Colles fracture. Patient was admitted to  the trauma service. Orthopedics was consulted and recommended nonoperative management of her LUE fractures; she was placed in a wrist splint and sling and recommended NWB LUE. Neurosurgery was consulted for T-spine fracture and closed head injury and recommended 7 days of keppra for seizure prophylaxis, hold aspirin x1 week. Does not need c-collar. Patient was noted to be persistently bradycardic during this admission. EKG revealed normal sinus rhythm with bigeminy PACs. Cardiology was consulted and ordered an ECHO. Her coreg was  held and norvasc decreased to 5mg  daily.  Patient worked with therapies during this admission who recommended home health therapies when medically stable for discharge. On 5/1, the patient was tolerating diet, working well with therapies, pain well controlled, vital signs stable and felt stable for discharge home with home health.  Patient will follow up as below and knows to call with questions or concerns.    I have personally reviewed the patients medication history on the [place] controlled substance database.    Allergies as of 08/26/2019   No Known Allergies     Medication List    STOP taking these medications   carvedilol 3.125 MG tablet Commonly known as: COREG     TAKE these medications   acetaminophen 325 MG tablet Commonly known as: TYLENOL Take 2 tablets (650 mg total) by mouth every 6 (six) hours.   amLODipine 10 MG tablet Commonly known as: NORVASC Take 0.5 tablets (5 mg total) by mouth daily. What changed: how much to take   aspirin 81 MG chewable tablet Chew 1 tablet (81 mg total) by mouth daily. Start taking on: Aug 29, 2019 What changed: These instructions start on Aug 29, 2019. If you are unsure what to do until then, ask your doctor or other care provider.   cholecalciferol 25 MCG (1000 UNIT) tablet Commonly known as: VITAMIN D3 Take 1,000 Units by mouth daily.   docusate sodium 100 MG capsule Commonly known as: COLACE Take 1 capsule (100 mg  total) by mouth 2 (two) times daily.   escitalopram 10 MG tablet Commonly known as: LEXAPRO Take 10 mg by mouth daily.   ipratropium 0.03 % nasal spray Commonly known as: ATROVENT Place 2 sprays into both nostrils 3 (three) times daily.   letrozole 2.5 MG tablet Commonly known as: FEMARA Take 2.5 mg by mouth daily.   levETIRAcetam 500 MG tablet Commonly known as: KEPPRA Take 1 tablet (500 mg total) by mouth 2 (two) times daily for 4 days.   oxyCODONE 5 MG immediate release tablet Commonly known as: Oxy IR/ROXICODONE Take 0.5-1 tablets (2.5-5 mg total) by mouth every 6 (six) hours as needed for severe pain.   polyethylene glycol 17 g packet Commonly known as: MIRALAX / GLYCOLAX Take 17 g by mouth daily as needed for mild constipation.   PRESCRIPTION MEDICATION Place 1 drop into both eyes daily. Medication for eye pressure. ( Patient don't know the name)   simvastatin 10 MG tablet Commonly known as: ZOCOR Take 10 mg by mouth daily.   valsartan 320 MG tablet Commonly known as: DIOVAN Take 320 mg by mouth daily.   VITAMIN B 12 PO Take 100 mcg by mouth daily.            Durable Medical Equipment  (From admission, onward)         Start     Ordered   08/25/19 1602  For home use only DME Cane  Once     08/25/19 1601          Follow-up Information    Marchia Bond, MD. Schedule an appointment as soon as possible for a visit in 2 week(s).   Specialty: Orthopedic Surgery Why: regarding clavicle fracture Contact information: [place] [address] [phone]        Roseanne Kaufman, MD. Schedule an appointment as soon as possible for a visit in 2 week(s).   Specialty: Orthopedic Surgery Why: regarding wrist fracture Contact information: [address]  [place] [address] [id number]        Judith Part, MD. Schedule an appointment as soon as possible for a visit.   Specialty:  Neurosurgery Why: regarding head injury Contact information: [place] [address] [phone]        Jani Gravel, MD. Call.   Specialty: Internal Medicine Why: call to arrange post-hospitalization follow up appointment with your primary care physician Contact information: [place] [address] [phone]        Almyra Deforest, [place] Follow up on 09/20/2019.   Specialties: Cardiology, Radiology Why: Cardiology Hospital Follow-up on 09/20/2019 at 3:45 PM.  Contact information: [address] [place] [address] [phone]           Signed: Wellington Hampshire, [facility] Surgery 08/26/2019, 12:48 PM Please see Amion for pager number during day hours 7:00am-4:30pm

## 2019-08-28 ENCOUNTER — Encounter: Payer: Self-pay | Admitting: *Deleted

## 2019-08-28 ENCOUNTER — Telehealth: Payer: Self-pay | Admitting: Cardiology

## 2019-08-28 DIAGNOSIS — R001 Bradycardia, unspecified: Secondary | ICD-10-CM

## 2019-08-28 NOTE — Telephone Encounter (Signed)
Zio monitor ordered Routed to [place]

## 2019-08-28 NOTE — Progress Notes (Signed)
Patient ID: Olivia Gray, female   DOB: [birth year], 86 y.o.   MRN: [id number] Patient enrolled for [employer] to ship a 3 day ZIO XT long term holter monitor to her home.  Instructions mailed to patient and will also be included in her monitor kit.

## 2019-08-28 NOTE — Telephone Encounter (Signed)
-----   Message from Erma Heritage, [place] sent at 08/26/2019 12:49 PM EDT ----- Regarding: Zio Patch Good afternoon,   This is a patient of Dr. Ellyn Hack and she needs a 3-day Zio patch per Dr. Percival Spanish for bradycardia. Being discharged from [facility] today and I already arranged a hospital follow-up appointment. She does have a merged chart so her past Cardiology office visits show up in the old chart.   Thanks,  [place]

## 2019-08-31 ENCOUNTER — Ambulatory Visit (INDEPENDENT_AMBULATORY_CARE_PROVIDER_SITE_OTHER): Payer: Medicare Other

## 2019-08-31 DIAGNOSIS — R001 Bradycardia, unspecified: Secondary | ICD-10-CM | POA: Diagnosis not present

## 2019-09-11 DIAGNOSIS — S065X0D Traumatic subdural hemorrhage without loss of consciousness, subsequent encounter: Secondary | ICD-10-CM | POA: Diagnosis not present

## 2019-09-11 DIAGNOSIS — S52532D Colles' fracture of left radius, subsequent encounter for closed fracture with routine healing: Secondary | ICD-10-CM | POA: Diagnosis not present

## 2019-09-11 DIAGNOSIS — S2242XD Multiple fractures of ribs, left side, subsequent encounter for fracture with routine healing: Secondary | ICD-10-CM | POA: Diagnosis not present

## 2019-09-11 DIAGNOSIS — S42002D Fracture of unspecified part of left clavicle, subsequent encounter for fracture with routine healing: Secondary | ICD-10-CM | POA: Diagnosis not present

## 2019-09-11 DIAGNOSIS — S22029D Unspecified fracture of second thoracic vertebra, subsequent encounter for fracture with routine healing: Secondary | ICD-10-CM | POA: Diagnosis not present

## 2019-09-13 DIAGNOSIS — S52502A Unspecified fracture of the lower end of left radius, initial encounter for closed fracture: Secondary | ICD-10-CM | POA: Diagnosis not present

## 2019-09-13 DIAGNOSIS — S42025A Nondisplaced fracture of shaft of left clavicle, initial encounter for closed fracture: Secondary | ICD-10-CM | POA: Diagnosis not present

## 2019-09-13 DIAGNOSIS — S43102A Unspecified dislocation of left acromioclavicular joint, initial encounter: Secondary | ICD-10-CM | POA: Diagnosis not present

## 2019-09-13 DIAGNOSIS — S065X9A Traumatic subdural hemorrhage with loss of consciousness of unspecified duration, initial encounter: Secondary | ICD-10-CM | POA: Diagnosis not present

## 2019-09-15 DIAGNOSIS — S42002D Fracture of unspecified part of left clavicle, subsequent encounter for fracture with routine healing: Secondary | ICD-10-CM | POA: Diagnosis not present

## 2019-09-15 DIAGNOSIS — S2242XD Multiple fractures of ribs, left side, subsequent encounter for fracture with routine healing: Secondary | ICD-10-CM | POA: Diagnosis not present

## 2019-09-15 DIAGNOSIS — S52532D Colles' fracture of left radius, subsequent encounter for closed fracture with routine healing: Secondary | ICD-10-CM | POA: Diagnosis not present

## 2019-09-15 DIAGNOSIS — S065X0D Traumatic subdural hemorrhage without loss of consciousness, subsequent encounter: Secondary | ICD-10-CM | POA: Diagnosis not present

## 2019-09-15 DIAGNOSIS — S22029D Unspecified fracture of second thoracic vertebra, subsequent encounter for fracture with routine healing: Secondary | ICD-10-CM | POA: Diagnosis not present

## 2019-09-18 DIAGNOSIS — S2242XD Multiple fractures of ribs, left side, subsequent encounter for fracture with routine healing: Secondary | ICD-10-CM | POA: Diagnosis not present

## 2019-09-18 DIAGNOSIS — S065X0D Traumatic subdural hemorrhage without loss of consciousness, subsequent encounter: Secondary | ICD-10-CM | POA: Diagnosis not present

## 2019-09-18 DIAGNOSIS — S52532D Colles' fracture of left radius, subsequent encounter for closed fracture with routine healing: Secondary | ICD-10-CM | POA: Diagnosis not present

## 2019-09-18 DIAGNOSIS — S42002D Fracture of unspecified part of left clavicle, subsequent encounter for fracture with routine healing: Secondary | ICD-10-CM | POA: Diagnosis not present

## 2019-09-18 DIAGNOSIS — S22029D Unspecified fracture of second thoracic vertebra, subsequent encounter for fracture with routine healing: Secondary | ICD-10-CM | POA: Diagnosis not present

## 2019-09-18 DIAGNOSIS — R001 Bradycardia, unspecified: Secondary | ICD-10-CM | POA: Diagnosis not present

## 2019-09-19 ENCOUNTER — Other Ambulatory Visit: Payer: Self-pay | Admitting: *Deleted

## 2019-09-19 DIAGNOSIS — S22029D Unspecified fracture of second thoracic vertebra, subsequent encounter for fracture with routine healing: Secondary | ICD-10-CM | POA: Diagnosis not present

## 2019-09-19 DIAGNOSIS — R001 Bradycardia, unspecified: Secondary | ICD-10-CM

## 2019-09-19 DIAGNOSIS — S42002D Fracture of unspecified part of left clavicle, subsequent encounter for fracture with routine healing: Secondary | ICD-10-CM | POA: Diagnosis not present

## 2019-09-19 DIAGNOSIS — S065X0D Traumatic subdural hemorrhage without loss of consciousness, subsequent encounter: Secondary | ICD-10-CM | POA: Diagnosis not present

## 2019-09-19 DIAGNOSIS — S2242XD Multiple fractures of ribs, left side, subsequent encounter for fracture with routine healing: Secondary | ICD-10-CM | POA: Diagnosis not present

## 2019-09-20 ENCOUNTER — Other Ambulatory Visit: Payer: Self-pay

## 2019-09-20 ENCOUNTER — Encounter: Payer: Self-pay | Admitting: Physician Assistant

## 2019-09-20 ENCOUNTER — Ambulatory Visit (INDEPENDENT_AMBULATORY_CARE_PROVIDER_SITE_OTHER): Payer: Medicare Other | Admitting: Physician Assistant

## 2019-09-20 VITALS — BP 128/80 | HR 62 | Temp 97.5°F | Ht 64.0 in | Wt 125.8 lb

## 2019-09-20 DIAGNOSIS — R7303 Prediabetes: Secondary | ICD-10-CM | POA: Diagnosis not present

## 2019-09-20 DIAGNOSIS — E785 Hyperlipidemia, unspecified: Secondary | ICD-10-CM | POA: Diagnosis not present

## 2019-09-20 DIAGNOSIS — I341 Nonrheumatic mitral (valve) prolapse: Secondary | ICD-10-CM

## 2019-09-20 DIAGNOSIS — R001 Bradycardia, unspecified: Secondary | ICD-10-CM

## 2019-09-20 DIAGNOSIS — S065XAA Traumatic subdural hemorrhage with loss of consciousness status unknown, initial encounter: Secondary | ICD-10-CM

## 2019-09-20 DIAGNOSIS — I25810 Atherosclerosis of coronary artery bypass graft(s) without angina pectoris: Secondary | ICD-10-CM

## 2019-09-20 DIAGNOSIS — I10 Essential (primary) hypertension: Secondary | ICD-10-CM

## 2019-09-20 DIAGNOSIS — S065X9S Traumatic subdural hemorrhage with loss of consciousness of unspecified duration, sequela: Secondary | ICD-10-CM

## 2019-09-20 NOTE — Patient Instructions (Signed)
Medication Instructions:  Your physician recommends that you continue on your current medications as directed. Please refer to the Current Medication list given to you today.  *If you need a refill on your cardiac medications before your next appointment, please call your pharmacy*  Lab Work: NONE ordered at this time of appointment   If you have labs (blood work) drawn today and your tests are completely normal, you will receive your results only by: Marland Kitchen MyChart Message (if you have MyChart) OR . A paper copy in the mail If you have any lab test that is abnormal or we need to change your treatment, we will call you to review the results.  Testing/Procedures: NONE ordered at this time of appointment   Follow-Up: At [facility], you and your health needs are our priority.  As part of our continuing mission to provide you with exceptional heart care, we have created designated Provider Care Teams.  These Care Teams include your primary Cardiologist (physician) and Advanced Practice Providers (APPs -  Physician Assistants and Nurse Practitioners) who all work together to provide you with the care you need, when you need it.  We recommend signing up for the patient portal called "MyChart".  Sign up information is provided on this After Visit Summary.  MyChart is used to connect with patients for Virtual Visits (Telemedicine).  Patients are able to view lab/test results, encounter notes, upcoming appointments, etc.  Non-urgent messages can be sent to your provider as well.   To learn more about what you can do with MyChart, go to [email or web address].    Your next appointment:   As Scheduled-11/20/2019 @ 2:40 PM  The format for your next appointment:   In Person  Provider:   Glenetta Hew, MD  Other Instructions

## 2019-09-20 NOTE — Progress Notes (Signed)
Cardiology Office Note:    Date:  09/22/2019   ID:  Olivia Gray, DOB 1932/07/29, MRN [id number]  PCP:  Jani Gravel, MD  Cardiologist:  Glenetta Hew, MD  Electrophysiologist:  None   Referring MD: Jani Gravel, MD   Chief Complaint  Patient presents with  . Follow-up    seen for Dr. Ellyn Hack.     History of Present Illness:    Olivia Gray is a 86 y.o. female with a hx of CAD s/p CABG x 4 in 2004 (SVG-LAD, SVG-D1, SVG-OM1, SVG-RPDA), breast CA s/p chemo/radiation therapy, HTN, HLD, prediabetes, legally blind and mitral valve prolapse.  Myoview in 2016 was negative for ischemia.  Patient was last seen virtually by Dr. Ellyn Hack on 11/11/2018 at which time she was doing well. She was admitted for seizure in December 2020 after being found down on the floor by her husband obtunded with some clonic movement and right gaze.  CT of the head at the time showed bilateral subdural hematomas.  She was taken to the OR for bilateral bur holes and drainage of SDH with drain placement.  More recently, patient was admitted in April 2021 after falling down half of a flight of stairs and suffered multiple injuries including left anterior 3-4 rib fractures, left clavicle fractures, left wrist Colles' fracture, T2 vertebral body fracture with mass-effect on the spinal canal, right frontal acute subdural hematoma, acute on chronic right posterior parietal subdural hematoma. During the hospitalization, she was noted to be bradycardic with irregular heart rhythm.  EKG demonstrated sinus rhythm with bigeminy and PACs.  Heart rate in the 60s, however there are some reported history down to the 40s.  Carvedilol has stopped, heart rate improved to the 70s.  Amlodipine was reduced to 5 mg daily.  Echocardiogram performed on 08/25/2019 showed EF 55 to 60%, moderate LAE.  She was discharged later on Zio patch.  Patient presents today along with her husband.  I reviewed her Zio patch monitor.  Heart rate ranged between 45-170s.  Heart  rate of 45 occurred around 10 AM.  However she denies any feeling of dizziness or feeling of passing out.  She had multiple episode of short bursts of atrial tachycardia versus SVT, they were very transient and only last less than 7 beats long.  Heart rate of 170 occurred during 1 such episode.  Most of the time, her heart rate was in the 60s.  Blood pressure was stable after she discontinued carvedilol and reducing amlodipine by half.  I recommended continue on the current therapy.  She can see Dr. Ellyn Hack in 2 months.   Past Medical History:  Diagnosis Date  . Breast cancer ([place]) 01/29/2016   Left Breast Cancer  . Breast cancer of upper-outer quadrant of left female breast ([place]) 12/27/2015  . CAD, multiple vessel 2004   Myoview 2011: Mild Basal-mid Inferolateral "ischemia", EF ~76?% -- Anormal, but LOW RISK  . Cancer ([place])   . Essential hypertension   . History of radiation therapy 03/11/16-04/29/16   left breast/axilla 50.4 Gy in 28 fractions, boost of 10 Gy in 5 fractions  . Hyperlipidemia LDL goal <70    On statin.  . Legally blind   . Mitral valve prolapse 02/07/2008; April 2015   a) Mild Anterior MVP, with mild MR.;; b) Echo 07/2013: EF 55-60%, Gr 1 DD, no WMA, Mild central MR with late systolic prolapse   . Personal history of radiation therapy 2017   Left Breast Cancer  . Pre-diabetes   .  Retinitis pigmentosa of both eyes     now essentially legally blind.  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 S/P CABG x 4 2004   SVG-LAD, SVG-D1, SVG-OM1, SVG-RPDA. (Native LIMA was 100% occluded)    Past Surgical History:  Procedure Laterality Date  . BRAIN SURGERY    . BREAST LUMPECTOMY Left 01/29/2016  . BREAST LUMPECTOMY WITH RADIOACTIVE SEED LOCALIZATION Left 01/29/2016   Procedure: LEFT BREAST LUMPECTOMY WITH RADIOACTIVE SEED LOCALIZATION;  Surgeon: [name], MD;   Location: [facility];  Service: General;  Laterality: Left;  . BREAST SURGERY Bilateral    cyst removal  . CARDIAC CATHETERIZATION  05/22/2009   all grafts patent ,LV systolic fx normal,small -vessel diabetic disease in native vessels   . CARDIAC SURGERY    . CHOLECYSTECTOMY  02/05/2005  . CORONARY ARTERY BYPASS GRAFT  07/21/2002   in [place] ; SVG-LAD, SVG-D1, SVG-OM1, SVG RPDA.  Marland Kitchen CRANIOTOMY Bilateral 04/09/2019   Procedure: BILATERAL BURR HOLES FOR EVACUATION SUBDURAL HEMATOMA;  Surgeon: Judith Part, MD;  Location: [facility];  Service: Neurosurgery;  Laterality: Bilateral;  . DOPPLER ECHOCARDIOGRAPHY  02/07/2008; April 2015   EF=>55%; LV normal; mild anterior MVP with mild MR.; b) 07/2013: EF 55-60%. Normal wall motion. Gr 1 DD, mild MR with late systolic prolapse. Normal central venous and pulmonary arterial pressures   . NM MYOCAR PERF WALL MOTION  04/2009   EF 76%; mild ischeia basal inferolateral,mid inferoinlateral regions(s), abn pharmacologic nuc test, deffect indicte new ischemia,LV syst.fx normal --> cath showing no obstructive targets.  Marland Kitchen NM MYOVIEW LTD  June 2016   Normal perfusion. Normal study. LOW RISK. Normal EF (55%). Compared to prior study, no ischemic changes present.    Current Medications: Current Meds  Medication Sig  . acetaminophen (TYLENOL) 325 MG tablet Take 2 tablets (650 mg total) by mouth every 6 (six) hours.  Marland Kitchen amLODipine (NORVASC) 10 MG tablet Take 0.5 tablets (5 mg total) by mouth daily.  Marland Kitchen aspirin 81 MG chewable tablet Chew 1 tablet (81 mg total) by mouth daily.  . D 1000 25 MCG (1000 UT) capsule Take 1,000 Units by mouth daily.  Marland Kitchen docusate sodium (COLACE) 100 MG capsule Take 1 capsule (100 mg total) by mouth 2 (two) times daily.  Marland Kitchen escitalopram (LEXAPRO) 10 MG tablet Take 10 mg by mouth daily.  Marland Kitchen latanoprost (XALATAN) 0.005 % ophthalmic solution   . letrozole (FEMARA) 2.5 MG tablet TAKE 1 TABLET BY MOUTH EVERY DAY (Patient taking  differently: Take 2.5 mg by mouth daily. )  . levETIRAcetam (KEPPRA) 500 MG tablet Take 1 tablet (500 mg total) by mouth 2 (two) times daily.  . nitroGLYCERIN (NITROSTAT) 0.4 MG SL tablet Place 1 tablet (0.4 mg total) under the tongue every 5 (five) minutes as needed for chest pain.  . polyethylene glycol (MIRALAX / GLYCOLAX) 17 g packet Take 17 g by mouth daily as needed for mild constipation.  Marland Kitchen PRESCRIPTION MEDICATION Place 1 drop into both eyes daily. Medication for eye pressure. ( Patient don't know the name)  . simvastatin (ZOCOR) 10 MG tablet Take 10 mg by mouth daily.  . traMADol (ULTRAM) 50 MG tablet   . valsartan (DIOVAN) 320 MG tablet Take 320 mg by mouth daily.  . vitamin B-12 (CYANOCOBALAMIN) 100 MCG tablet Take 100 mcg by mouth daily.  . [DISCONTINUED] amLODipine (NORVASC) 10 MG  tablet TAKE 1 TABLET (10 MG TOTAL) BY MOUTH DAILY. (Patient taking differently: Take 10 mg by mouth daily. )  . [DISCONTINUED] carvedilol (COREG) 3.125 MG tablet TAKE 1 TABLET (3.125 MG TOTAL) BY MOUTH 2 (TWO) TIMES DAILY. (Patient taking differently: Take 3.125 mg by mouth 2 (two) times daily with a meal. )  . [DISCONTINUED] cholecalciferol (VITAMIN D) 1000 UNITS tablet Take 1,000 Units by mouth daily.  . [DISCONTINUED] cholecalciferol (VITAMIN D3) 25 MCG (1000 UNIT) tablet Take 1,000 Units by mouth daily.     Allergies:   Patient has no known allergies.   Social History   Socioeconomic History  . Marital status: Married    Spouse name: Not on file  . Number of children: 2  . Years of education: Not on file  . Highest education level: Not on file  Occupational History  . Not on file  Tobacco Use  . Smoking status: Never Smoker  . Smokeless tobacco: Never Used  Substance and Sexual Activity  . Alcohol use: Not Currently  . Drug use: Not Currently  . Sexual activity: Yes    Birth control/protection: Post-menopausal  Other Topics Concern  . Not on file  Social History Narrative   ** Merged  History Encounter **       She is married with 2 children. Does not smoke or drink alcohol. She does routine exercise roughly 3-4 days a week where she follows a   The video. Weights stomach exercises jumping jacks and other aerobic type exercises.   Social Determinants of Health   Financial Resource Strain:   . Difficulty of Paying Living Expenses:   Food Insecurity:   . Worried About Charity fundraiser in the Last Year:   . Arboriculturist in the Last Year:   Transportation Needs:   . Film/video editor (Medical):   Marland Kitchen Lack of Transportation (Non-Medical):   Physical Activity:   . Days of Exercise per Week:   . Minutes of Exercise per Session:   Stress:   . Feeling of Stress :   Social Connections:   . Frequency of Communication with Friends and Family:   . Frequency of Social Gatherings with Friends and Family:   . Attends Religious Services:   . Active Member of Clubs or Organizations:   . Attends Archivist Meetings:   Marland Kitchen Marital Status:      Family History: The patient's family history includes Breast cancer in her sister, sister, sister, and sister.  ROS:   Please see the history of present illness.     All other systems reviewed and are negative.  EKGs/Labs/Other Studies Reviewed:    The following studies were reviewed today:  Echo 08/25/2019 1. Left ventricular ejection fraction, by estimation, is 55 to 60%. The  left ventricle has normal function. The left ventricle has no regional  wall motion abnormalities. Left ventricular diastolic parameters were  normal.  2. Right ventricular systolic function is normal. The right ventricular  size is normal.  3. Left atrial size was moderately dilated.  4. The mitral valve is normal in structure. Trivial mitral valve  regurgitation. No evidence of mitral stenosis.  5. The aortic valve is tricuspid. Aortic valve regurgitation is mild.  Mild to moderate aortic valve sclerosis/calcification is present,  without  any evidence of aortic stenosis.  6. The inferior vena cava is normal in size with greater than 50%  respiratory variability, suggesting right atrial pressure of 3 mmHg.  EKG:  EKG is not ordered today.   Recent Labs: 04/08/2019: TSH 0.697 08/22/2019: ALT 20 08/24/2019: Magnesium 2.0 08/26/2019: BUN 17; Creatinine, Ser 0.76; Hemoglobin 10.4; Platelets 160; Potassium 3.9; Sodium 140  Recent Lipid Panel No results found for: CHOL, TRIG, HDL, CHOLHDL, VLDL, LDLCALC, LDLDIRECT  Physical Exam:    VS:  BP 128/80   Pulse 62   Temp (!) 97.5 F (36.4 C)   Ht 5\' 4"  (1.626 m)   Wt 125 lb 12.5 oz (57.1 kg)   SpO2 97%   BMI 21.59 kg/m     Wt Readings from Last 3 Encounters:  09/20/19 125 lb 12.5 oz (57.1 kg)  08/23/19 133 lb 6.1 oz (60.5 kg)  12/27/18 141 lb 4.8 oz (64.1 kg)     GEN:  Well nourished, well developed in no acute distress HEENT: Normal NECK: No JVD; No carotid bruits LYMPHATICS: No lymphadenopathy CARDIAC: RRR, no murmurs, rubs, gallops RESPIRATORY:  Clear to auscultation without rales, wheezing or rhonchi  ABDOMEN: Soft, non-tender, non-distended MUSCULOSKELETAL:  No edema; No deformity  SKIN: Warm and dry NEUROLOGIC:  Alert and oriented x 3 PSYCHIATRIC:  Normal affect   ASSESSMENT:    1. Bradycardia   2. Coronary artery disease involving coronary bypass graft of native heart without angina pectoris   3. Essential hypertension   4. Hyperlipidemia LDL goal <70   5. Prediabetes   6. Mitral valve anterior leaflet prolapse   7. Subdural hematoma (HCC)    PLAN:    In order of problems listed above:  1. Bradycardia: Bradycardia significantly improved on the recent heart monitor after stopping down carvedilol.  Slowest heart rate on the heart monitor was 45 bpm, however patient was asymptomatic with this heart rate.  Average heart rate was normal  2. CAD s/p CABG: Denies any recent chest pain  3. Hypertension: Carvedilol has been discontinued,  amlodipine has been cut in half  4. Hyperlipidemia: Continue statin therapy  5. Prediabetes: Managed by primary care provider  6. Mitral valve prolapse: Recent heart monitor did not mention mitral valve prolapse.  She did have trivial mitral valve regurgitation.  7. Subdural hematoma: Related to trauma.  She fell on the floor in late 2020 and had bilateral enlarging hematoma require placement of bur hole by neurosurgery.  Recently she went to the hospital again, CT of the head showed a stable subdural hematoma.  I recommend she double check with neurosurgery to see how often she need to be followed up by the surgeon or at least repeat image.   Medication Adjustments/Labs and Tests Ordered: Current medicines are reviewed at length with the patient today.  Concerns regarding medicines are outlined above.  No orders of the defined types were placed in this encounter.  No orders of the defined types were placed in this encounter.   Patient Instructions  Medication Instructions:  Your physician recommends that you continue on your current medications as directed. Please refer to the Current Medication list given to you today.  *If you need a refill on your cardiac medications before your next appointment, please call your pharmacy*  Lab Work: NONE ordered at this time of appointment   If you have labs (blood work) drawn today and your tests are completely normal, you will receive your results only by: Marland Kitchen MyChart Message (if you have MyChart) OR . A paper copy in the mail If you have any lab test that is abnormal or we need to change your treatment, we will call you to  review the results.  Testing/Procedures: NONE ordered at this time of appointment   Follow-Up: At [facility], you and your health needs are our priority.  As part of our continuing mission to provide you with exceptional heart care, we have created designated Provider Care Teams.  These Care Teams include your primary  Cardiologist (physician) and Advanced Practice Providers (APPs -  Physician Assistants and Nurse Practitioners) who all work together to provide you with the care you need, when you need it.  We recommend signing up for the patient portal called "MyChart".  Sign up information is provided on this After Visit Summary.  MyChart is used to connect with patients for Virtual Visits (Telemedicine).  Patients are able to view lab/test results, encounter notes, upcoming appointments, etc.  Non-urgent messages can be sent to your provider as well.   To learn more about what you can do with MyChart, go to [email or web address].    Your next appointment:   As Scheduled-11/20/2019 @ 2:40 PM  The format for your next appointment:   In Person  Provider:   Glenetta Hew, MD  Other Instructions      Signed, Almyra Deforest, [place]  09/22/2019 11:58 PM    [place]

## 2019-09-22 ENCOUNTER — Encounter: Payer: Self-pay | Admitting: Physician Assistant

## 2019-09-27 DIAGNOSIS — S065X0D Traumatic subdural hemorrhage without loss of consciousness, subsequent encounter: Secondary | ICD-10-CM | POA: Diagnosis not present

## 2019-09-27 DIAGNOSIS — S2242XD Multiple fractures of ribs, left side, subsequent encounter for fracture with routine healing: Secondary | ICD-10-CM | POA: Diagnosis not present

## 2019-09-27 DIAGNOSIS — S22029D Unspecified fracture of second thoracic vertebra, subsequent encounter for fracture with routine healing: Secondary | ICD-10-CM | POA: Diagnosis not present

## 2019-09-27 DIAGNOSIS — S52532D Colles' fracture of left radius, subsequent encounter for closed fracture with routine healing: Secondary | ICD-10-CM | POA: Diagnosis not present

## 2019-09-27 DIAGNOSIS — S42002D Fracture of unspecified part of left clavicle, subsequent encounter for fracture with routine healing: Secondary | ICD-10-CM | POA: Diagnosis not present

## 2019-09-29 DIAGNOSIS — S065X0D Traumatic subdural hemorrhage without loss of consciousness, subsequent encounter: Secondary | ICD-10-CM | POA: Diagnosis not present

## 2019-09-29 DIAGNOSIS — S42002D Fracture of unspecified part of left clavicle, subsequent encounter for fracture with routine healing: Secondary | ICD-10-CM | POA: Diagnosis not present

## 2019-09-29 DIAGNOSIS — S52532D Colles' fracture of left radius, subsequent encounter for closed fracture with routine healing: Secondary | ICD-10-CM | POA: Diagnosis not present

## 2019-09-29 DIAGNOSIS — S2242XD Multiple fractures of ribs, left side, subsequent encounter for fracture with routine healing: Secondary | ICD-10-CM | POA: Diagnosis not present

## 2019-09-29 DIAGNOSIS — S22029D Unspecified fracture of second thoracic vertebra, subsequent encounter for fracture with routine healing: Secondary | ICD-10-CM | POA: Diagnosis not present

## 2019-10-06 DIAGNOSIS — S42002D Fracture of unspecified part of left clavicle, subsequent encounter for fracture with routine healing: Secondary | ICD-10-CM | POA: Diagnosis not present

## 2019-10-06 DIAGNOSIS — S22029D Unspecified fracture of second thoracic vertebra, subsequent encounter for fracture with routine healing: Secondary | ICD-10-CM | POA: Diagnosis not present

## 2019-10-06 DIAGNOSIS — S065X0D Traumatic subdural hemorrhage without loss of consciousness, subsequent encounter: Secondary | ICD-10-CM | POA: Diagnosis not present

## 2019-10-06 DIAGNOSIS — S52532D Colles' fracture of left radius, subsequent encounter for closed fracture with routine healing: Secondary | ICD-10-CM | POA: Diagnosis not present

## 2019-10-06 DIAGNOSIS — S2242XD Multiple fractures of ribs, left side, subsequent encounter for fracture with routine healing: Secondary | ICD-10-CM | POA: Diagnosis not present

## 2019-10-09 DIAGNOSIS — S2242XS Multiple fractures of ribs, left side, sequela: Secondary | ICD-10-CM | POA: Diagnosis not present

## 2019-10-09 DIAGNOSIS — S065X9A Traumatic subdural hemorrhage with loss of consciousness of unspecified duration, initial encounter: Secondary | ICD-10-CM | POA: Diagnosis not present

## 2019-10-09 DIAGNOSIS — S62102S Fracture of unspecified carpal bone, left wrist, sequela: Secondary | ICD-10-CM | POA: Diagnosis not present

## 2019-10-09 DIAGNOSIS — S42002S Fracture of unspecified part of left clavicle, sequela: Secondary | ICD-10-CM | POA: Diagnosis not present

## 2019-10-09 DIAGNOSIS — W19XXXS Unspecified fall, sequela: Secondary | ICD-10-CM | POA: Diagnosis not present

## 2019-10-11 DIAGNOSIS — S42025A Nondisplaced fracture of shaft of left clavicle, initial encounter for closed fracture: Secondary | ICD-10-CM | POA: Diagnosis not present

## 2019-10-11 DIAGNOSIS — S52502D Unspecified fracture of the lower end of left radius, subsequent encounter for closed fracture with routine healing: Secondary | ICD-10-CM | POA: Diagnosis not present

## 2019-10-13 DIAGNOSIS — S065X0D Traumatic subdural hemorrhage without loss of consciousness, subsequent encounter: Secondary | ICD-10-CM | POA: Diagnosis not present

## 2019-10-13 DIAGNOSIS — S2242XD Multiple fractures of ribs, left side, subsequent encounter for fracture with routine healing: Secondary | ICD-10-CM | POA: Diagnosis not present

## 2019-10-13 DIAGNOSIS — S22029D Unspecified fracture of second thoracic vertebra, subsequent encounter for fracture with routine healing: Secondary | ICD-10-CM | POA: Diagnosis not present

## 2019-10-13 DIAGNOSIS — S52532D Colles' fracture of left radius, subsequent encounter for closed fracture with routine healing: Secondary | ICD-10-CM | POA: Diagnosis not present

## 2019-10-13 DIAGNOSIS — S42002D Fracture of unspecified part of left clavicle, subsequent encounter for fracture with routine healing: Secondary | ICD-10-CM | POA: Diagnosis not present

## 2019-10-26 ENCOUNTER — Ambulatory Visit: Payer: Medicare Other | Attending: Internal Medicine

## 2019-10-26 ENCOUNTER — Other Ambulatory Visit: Payer: Self-pay | Admitting: Cardiology

## 2019-10-26 DIAGNOSIS — Z23 Encounter for immunization: Secondary | ICD-10-CM

## 2019-10-26 NOTE — Progress Notes (Signed)
   Covid-19 Vaccination Clinic  Name:  Olivia Gray    MRN: [id number] DOB: 07/19/1932  10/26/2019  Olivia Gray was observed post Covid-19 immunization for 15 minutes without incident. She was provided with Vaccine Information Sheet and instruction to access the V-Safe system.   Olivia Gray was instructed to call 911 with any severe reactions post vaccine: Marland Kitchen Difficulty breathing  . Swelling of face and throat  . A fast heartbeat  . A bad rash all over body  . Dizziness and weakness   Immunizations Administered    Name Date Dose VIS Date Route   Pfizer COVID-19 Vaccine 10/26/2019 12:03 PM 0.3 mL 06/21/2018 Intramuscular   Manufacturer: [place]   Lot: [id number]   [place]: [id number]

## 2019-11-08 DIAGNOSIS — S42025A Nondisplaced fracture of shaft of left clavicle, initial encounter for closed fracture: Secondary | ICD-10-CM | POA: Diagnosis not present

## 2019-11-08 DIAGNOSIS — S52502A Unspecified fracture of the lower end of left radius, initial encounter for closed fracture: Secondary | ICD-10-CM | POA: Diagnosis not present

## 2019-11-20 ENCOUNTER — Ambulatory Visit: Payer: Medicare Other | Admitting: Cardiology

## 2019-11-22 ENCOUNTER — Telehealth (INDEPENDENT_AMBULATORY_CARE_PROVIDER_SITE_OTHER): Payer: Medicare Other | Admitting: Cardiology

## 2019-11-22 ENCOUNTER — Encounter: Payer: Self-pay | Admitting: Cardiology

## 2019-11-22 DIAGNOSIS — R296 Repeated falls: Secondary | ICD-10-CM | POA: Diagnosis not present

## 2019-11-22 DIAGNOSIS — E785 Hyperlipidemia, unspecified: Secondary | ICD-10-CM

## 2019-11-22 DIAGNOSIS — I2510 Atherosclerotic heart disease of native coronary artery without angina pectoris: Secondary | ICD-10-CM

## 2019-11-22 DIAGNOSIS — I10 Essential (primary) hypertension: Secondary | ICD-10-CM

## 2019-11-22 NOTE — Patient Instructions (Signed)
Medication Instructions:  None *If you need a refill on your cardiac medications before your next appointment, please call your pharmacy*   Lab Work: Routine lab work from primary care doctor If you have labs (blood work) drawn today and your tests are completely normal, you will receive your results only by: Marland Kitchen MyChart Message (if you have MyChart) OR . A paper copy in the mail If you have any lab test that is abnormal or we need to change your treatment, we will call you to review the results.   Testing/Procedures:  None for now, but if you do have another fall or pass out spell, we may want to consider a more long-term monitor.   Follow-Up: At [facility], you and your health needs are our priority.  As part of our continuing mission to provide you with exceptional heart care, we have created designated Provider Care Teams.  These Care Teams include your primary Cardiologist (physician) and Advanced Practice Providers (APPs -  Physician Assistants and Nurse Practitioners) who all work together to provide you with the care you need, when you need it.  We recommend signing up for the patient portal called "MyChart".  Sign up information is provided on this After Visit Summary.  MyChart is used to connect with patients for Virtual Visits (Telemedicine).  Patients are able to view lab/test results, encounter notes, upcoming appointments, etc.  Non-urgent messages can be sent to your provider as well.   To learn more about what you can do with MyChart, go to [email or web address].    Your next appointment:   6 month(s)  The format for your next appointment:   In Person  Provider:   Glenetta Hew, MD   Other Instructions N/A

## 2019-11-22 NOTE — Progress Notes (Signed)
Virtual Visit via Telephone Note   This visit type was conducted due to national recommendations for restrictions regarding the COVID-19 Pandemic (e.g. social distancing) in an effort to limit this patient's exposure and mitigate transmission in our community.  Due to her co-morbid illnesses, this patient is at least at moderate risk for complications without adequate follow up.  This format is felt to be most appropriate for this patient at this time.  The patient did not have access to video technology/had technical difficulties with video requiring transitioning to audio format only (telephone).  All issues noted in this document were discussed and addressed.  No physical exam could be performed with this format.  Please refer to the patient's chart for her  consent to telehealth for [facility].   Patient has given verbal permission to conduct this visit via virtual appointment and to bill insurance 11/26/2019 5:18 AM     Evaluation Performed:  Follow-up visit  Date:  11/26/2019   ID:  Olivia Gray, DOB 1932-06-10, MRN [id number]  Patient Location: Home Provider Location: Office/Clinic  PCP:  Jani Gravel, MD  Cardiologist:  Glenetta Hew, MD  Electrophysiologist:  None   Chief Complaint:   Chief Complaint  Patient presents with   Follow-up    Study results   Bradycardia    History of Present Illness:    Olivia Gray is a 86 y.o. legally blind female with PMH notable for CAD-CABG x4 in 2004 (SVG-LAD, SVG-D1, SVG-OM1, SVG-PDA), breast cancer (s/p chemo/radiation), HTN, HLD, prediabetes, MVP who presents via audio/video conferencing for a telehealth visit today.  Hospitalizations:   April 08, 2019: Hospital admission found on the floor by her husband, obtunded with clonic movement and rightward gaze.  CT head showed bilateral subdural hematomas with bur hole drainage of SDH with drain placement. -- faall - broke R wrist  August 22, 2019: Admitted after fall (fell down a flight  of steps) multiple fractures including left anterior third and fourth rib fractures, left clavicle fracture, left wrist Colles' fracture T10 vertebral body fracture with mass-effect and spinal canal as well as right frontal acute subdural hematoma acute on chronic right posterior parietal subdural hematoma.   o While inpatien - noted "irregularly irregular rhythm & bradycardia on Telemetry. EKG - NSR w/ PACs in bigeminy & HR in 60s mostly with occasional drops to 40s.   o Cardiology C/s (Dr. Percival Spanish) --> carvedilol d/c - HR improved .  Amlodipine reduced 2/2 concern for orthostatic hypotension.   Olivia Gray was last seen on May 26 by Olivia Deforest, PA as a ER follow-up for evaluation of bradycardia.  I have not seen her since a virtual visit in July 2020.  This was in response to the recent hospitalization, Zio patch reviewed with no significant findings.  Short bursts of PAT and lowest heart rate 45.  Seems stable after stopping beta-blocker.    Recent - Interim CV studies:   The following studies were reviewed today:  TTE (08/25/2019: EF 50 and 55%.  No R WMA.  Moderate LAE.  Mild to moderate aortic sclerosis but no stenosis.  Zio patch (ordered on hospital discharge): Heart rate ranged between 45-170.  45 occurred at 10 AM.  Denied any dizziness or near syncope.  Multiple episodes of atrial tach versus SVT transient-lasting 7-10 beats.  Fastest heart rate 170 bpm.  Majority time average heart rate was in the 60s. o Mostly sinus rhythm with minimum heart rate 45 bpm, maximum 125 bpm.  Average 70  bpm. o SVT/PAT: 10 brief episodes.  Longest and fastest-7 beats, max rate 171 bpm.  Frequent isolated PACs.  (12.2%) rare couplets and triplets noted.  Occasional isolated PVCs (1.5%) with rare couplets and triplets.  Ventricular trigeminy noted lasting 6.5 seconds.  Inerval History   Olivia Gray is doing fairly well today recovering from her recent fall & injuries.  Doing better.  She indicated that she  "missed a step" when she fell.  She had gone downstairs to the fridge & "lost her balance".  Denied lightheadedness or dizziness. She really does not know how she fell or where she fell.  She does not remember anything after getting to the fridge. Husband found her in the bathroom downstairs - & noted that she was bleeding.  She was seated on the commode - but has not memory of how she got to the bathroom.   Noted that Coreg was stopped & Amlodipine dose was dropped.  -- BP levels have been "doing good" since d/c. No recurrent lightheaded or dizzy spells. Denies any Sx of rapid or irregular heartbeats.  No SSx of Syncope/near-syncope or TIA/amaurosis fugax.  Wrist brace off ~ 1 wk ago - still hurts a bit.  Now started getting back to doing rehab/PT --> no CP or DOE.  Cardiovascular ROS: no chest pain or dyspnea on exertion positive for - ? 2 unexplained falls (listed above) negative for - dyspnea on exertion, edema, irregular heartbeat, orthopnea, palpitations, paroxysmal nocturnal dyspnea, rapid heart rate or shortness of breath; NO further falls or syncope/near syncope, TIA/amaurosis fugax.   ROS:  Please see the history of present illness.    The patient does not have symptoms concerning for COVID-19 infection (fever, chills, cough, or new shortness of breath).  Review of Systems  Constitutional: Negative for malaise/fatigue (getting back on her feet - doing rehab/PT) and weight loss.  HENT: Negative for congestion and nosebleeds.   Respiratory: Negative for shortness of breath.   Cardiovascular: Positive for chest pain (chest wall pain from fractured ribs - getting better).  Gastrointestinal: Negative for blood in stool and melena.  Genitourinary: Positive for frequency (especially frequent Nocturia (is hoping to get Rx for PureWick @ home -- hoping to get insurance coverage)). Negative for dysuria and hematuria.  Musculoskeletal: Positive for falls (Per HPI) and joint pain (Recovering from  various injuries -> wrist still hurts some.   L ankle still swollen some from her injury.). Negative for back pain.  Neurological: Positive for tingling (Has a tingling sensation in her legs -- from long before her injury.).  Psychiatric/Behavioral: Negative for memory loss (with the exception of amnesia related to her fall / injuries). The patient is nervous/anxious (since her fall).     The patient is practicing social distancing.  Immunization History  Administered Date(s) Administered   PFIZER SARS-COV-2 Vaccination 10/26/2019, 11/23/2019   Pneumococcal-Unspecified 04/27/2013  -- due for 2nd Phizer injection this week.   Past Medical History:  Diagnosis Date   Breast cancer ([place]) 01/29/2016   Left Breast Cancer   Breast cancer of upper-outer quadrant of left female breast ([place]) 12/27/2015   CAD, multiple vessel 2004   Myoview 2011: Mild Basal-mid Inferolateral "ischemia", EF ~76?% -- Anormal, but LOW RISK   Cancer ([place])    Essential hypertension    History of radiation therapy 03/11/16-04/29/16   left breast/axilla 50.4 Gy in 28 fractions, boost of 10 Gy in 5 fractions   Hyperlipidemia LDL goal <70    On statin.  Legally blind    Mitral valve prolapse 02/07/2008; April 2015   a) Mild Anterior MVP, with mild MR.;; b) Echo 07/2013: EF 55-60%, Gr 1 DD, no WMA, Mild central MR with late systolic prolapse    Personal history of radiation therapy 2017   Left Breast Cancer   Pre-diabetes    Retinitis pigmentosa of both eyes     now essentially legally blind.  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 (Native LIMA was 100% occluded)   Past Surgical History:  Procedure Laterality Date   BRAIN SURGERY     BREAST LUMPECTOMY Left 01/29/2016   BREAST LUMPECTOMY WITH RADIOACTIVE SEED LOCALIZATION Left  01/29/2016   Procedure: LEFT BREAST LUMPECTOMY WITH RADIOACTIVE SEED LOCALIZATION;  Surgeon: [name], MD;  Location: [facility];  Service: General;  Laterality: Left;   BREAST SURGERY Bilateral    cyst removal   CARDIAC CATHETERIZATION  05/22/2009   all grafts patent ,LV systolic fx normal,small -vessel diabetic disease in native vessels    CARDIAC SURGERY     CHOLECYSTECTOMY  02/05/2005   CORONARY ARTERY BYPASS GRAFT  07/21/2002   in [place] ; SVG-LAD, SVG-D1, SVG-OM1, SVG RPDA.   CRANIOTOMY Bilateral 04/09/2019   Procedure: BILATERAL BURR HOLES FOR EVACUATION SUBDURAL HEMATOMA;  Surgeon: Judith Part, MD;  Location: [facility];  Service: Neurosurgery;  Laterality: Bilateral;   NM MYOCAR PERF WALL MOTION  04/2009   EF 76%; mild ischeia basal inferolateral,mid inferoinlateral regions(s), abn pharmacologic nuc test, deffect indicte new ischemia,LV syst.fx normal --> cath showing no obstructive targets.   NM MYOVIEW LTD  June 2016   Normal perfusion. Normal study. LOW RISK. Normal EF (55%). Compared to prior study, no ischemic changes present.   TRANSTHORACIC ECHOCARDIOGRAM  4/20221    EF 50 and 55%.  No R WMA.  Moderate LAE.  Mild to moderate aortic sclerosis but no stenosis.     Current Meds  Medication Sig   acetaminophen (TYLENOL) 325 MG tablet Take 2 tablets (650 mg total) by mouth every 6 (six) hours.   amLODipine (NORVASC) 10 MG tablet TAKE 1 TABLET BY MOUTH EVERY DAY   aspirin 81 MG chewable tablet Chew 1 tablet (81 mg total) by mouth daily.   D 1000 25 MCG (1000 UT) capsule Take 1,000 Units by mouth daily.   docusate sodium (COLACE) 100 MG capsule Take 1 capsule (100 mg total) by mouth 2 (two) times daily.   escitalopram (LEXAPRO) 10 MG tablet Take 10 mg by mouth daily.   latanoprost (XALATAN) 0.005 % ophthalmic solution    letrozole (FEMARA) 2.5 MG tablet TAKE 1 TABLET BY MOUTH EVERY DAY (Patient taking differently: Take 2.5 mg by mouth  daily. )   levETIRAcetam (KEPPRA) 500 MG tablet Take 1 tablet (500 mg total) by mouth 2 (two) times daily.   nitroGLYCERIN (NITROSTAT) 0.4 MG SL tablet Place 1 tablet (0.4 mg total) under the tongue every 5 (five) minutes as needed for chest pain.   polyethylene glycol (MIRALAX / GLYCOLAX) 17 g packet Take 17 g by mouth daily as needed for mild constipation.   PRESCRIPTION MEDICATION Place 1 drop into both eyes daily. Medication for eye pressure. ( Patient don't know the name)   simvastatin (ZOCOR) 10 MG tablet Take 10 mg by mouth daily.   traMADol (  ULTRAM) 50 MG tablet    valsartan (DIOVAN) 320 MG tablet TAKE 1 TABLET DAILY. TO REPLACE IRBESARTAN.   vitamin B-12 (CYANOCOBALAMIN) 100 MCG tablet Take 100 mcg by mouth daily.   --taking 1/2 tab amlodipine  Allergies:   Patient has no known allergies.   Social History   Tobacco Use   Smoking status: Never Smoker   Smokeless tobacco: Never Used  Substance Use Topics   Alcohol use: Not Currently   Drug use: Not Currently     Family Hx: The patient's family history includes Breast cancer in her sister, sister, sister, and sister.   Labs/Other Tests and Data Reviewed:    EKG:  No ECG reviewed.  Recent Labs: 04/08/2019: TSH 0.697 08/22/2019: ALT 20 08/24/2019: Magnesium 2.0 08/26/2019: BUN 17; Creatinine, Ser 0.76; Hemoglobin 10.4; Platelets 160; Potassium 3.9; Sodium 140   Recent Lipid Panel No results found for: CHOL, TRIG, HDL, CHOLHDL, LDLCALC, LDLDIRECT  Wt Readings from Last 3 Encounters:  09/20/19 125 lb 12.5 oz (57.1 kg)  08/23/19 133 lb 6.1 oz (60.5 kg)  12/27/18 141 lb 4.8 oz (64.1 kg)     Objective:    Vital Signs:  Ht 5\' 4"  (1.626 m)    BMI 21.59 kg/m   VITAL SIGNS:  reviewed Pleasant elderlyl female in No acute distress. A&O x 3.  normal Mood & Affect Non-labored respirations  ASSESSMENT & PLAN:    Problem List Items Addressed This Visit    Coronary artery disease involving native heart without  angina pectoris (Chronic)    Overall doing well from a cardiac standpoint with no active angina or heart failure symptoms.  Remains on aspirin and statin along with amlodipine at reduced dose.  Also on stable ARB dose.  Beta-blocker was stopped during last hospitalization due to concerns of bradycardia.  Amlodipine dose was reduced due to hypotension.  Normal echo during her last hospitalization.  In the absence of any active symptoms, would probably avoid further testing unless symptoms warrant.        Essential hypertension (Chronic)    Medications were dropped-carvedilol stopped and amlodipine dose cut in half during last hospitalization.  There is concern for possible orthostatic symptoms and bradycardia.  Unfortunately do not have a blood pressure recorded for today.  For now we will continue with medicines as they are.      Hyperlipidemia LDL goal <70 (Chronic)    No labs available.  Has been followed by PCP.  Unfortunately, labs not available.  She is on reduced dose of simvastatin because of amlodipine.  Now that amlodipine dose is reduced, we can either increase simvastatin or change to a different statin altogether if lipids are not at goal.  LDL goal less than 70.      Unwitnessed fall    I am a little bit concerned about her having 2 significant falls in roughly 5 months between December and April.  The episode back in December was more concerning because of the head bleeds.  Each episode she did not recall what happened.  It is hard to tell what happened and whether it was syncope or loss of balance and then mild amnesia.  With this most recent episode she somehow ended up from the refrigerator into the bathroom with no recollection of what happened.  Her monitor really show any significant.  Echocardiogram was also normal.  If she is to have another episode similar to this, I think a loop recorder would probably be warranted is concern for  either bradycardia and/or  ventricular arrhythmias.  With no documented loss of consciousness, I think were okay, and I recommended that she limit her driving.         COVID-19 Education: The signs and symptoms of COVID-19 were discussed with the patient and how to seek care for testing (follow up with PCP or arrange E-visit).   The importance of social distancing was discussed today.  Time:   Today, I have spent 18 minutes with the patient with telehealth technology discussing the above problems.  8 min charting -- 26 min   Medication Adjustments/Labs and Tests Ordered: Current medicines are reviewed at length with the patient today.  Concerns regarding medicines are outlined above.   Patient Instructions  Medication Instructions:  None *If you need a refill on your cardiac medications before your next appointment, please call your pharmacy*   Lab Work: Routine lab work from primary care doctor If you have labs (blood work) drawn today and your tests are completely normal, you will receive your results only by:  [place] (if you have MyChart) OR  A paper copy in the mail If you have any lab test that is abnormal or we need to change your treatment, we will call you to review the results.   Testing/Procedures:  None for now, but if you do have another fall or pass out spell, we may want to consider a more long-term monitor.   Follow-Up: At [facility], you and your health needs are our priority.  As part of our continuing mission to provide you with exceptional heart care, we have created designated Provider Care Teams.  These Care Teams include your primary Cardiologist (physician) and Advanced Practice Providers (APPs -  Physician Assistants and Nurse Practitioners) who all work together to provide you with the care you need, when you need it.  We recommend signing up for the patient portal called "MyChart".  Sign up information is provided on this After Visit Summary.  MyChart is used to  connect with patients for Virtual Visits (Telemedicine).  Patients are able to view lab/test results, encounter notes, upcoming appointments, etc.  Non-urgent messages can be sent to your provider as well.   To learn more about what you can do with MyChart, go to [email or web address].    Your next appointment:   6 month(s)  The format for your next appointment:   In Person  Provider:   Glenetta Hew, MD   Other Instructions N/A     Signed, Glenetta Hew, MD  11/26/2019 5:18 AM    [place]

## 2019-11-23 ENCOUNTER — Ambulatory Visit: Payer: Medicare Other | Attending: Internal Medicine

## 2019-11-23 DIAGNOSIS — Z23 Encounter for immunization: Secondary | ICD-10-CM

## 2019-11-23 NOTE — Progress Notes (Signed)
   Covid-19 Vaccination Clinic  Name:  Telisha Zawadzki    MRN: [id number] DOB: 04/30/1932  11/23/2019  Ms. Behrendt was observed post Covid-19 immunization for 15 minutes without incident. She was provided with Vaccine Information Sheet and instruction to access the V-Safe system.   Ms. Roeder was instructed to call 911 with any severe reactions post vaccine: Marland Kitchen Difficulty breathing  . Swelling of face and throat  . A fast heartbeat  . A bad rash all over body  . Dizziness and weakness   Immunizations Administered    Name Date Dose VIS Date Route   Pfizer COVID-19 Vaccine 11/23/2019 11:29 AM 0.3 mL 06/21/2018 Intramuscular   Manufacturer: [place]   Lot: [id number]   [place]: [id number]

## 2019-11-26 ENCOUNTER — Encounter: Payer: Self-pay | Admitting: Cardiology

## 2019-11-26 DIAGNOSIS — R296 Repeated falls: Secondary | ICD-10-CM | POA: Insufficient documentation

## 2019-11-26 NOTE — Assessment & Plan Note (Signed)
Overall doing well from a cardiac standpoint with no active angina or heart failure symptoms.  Remains on aspirin and statin along with amlodipine at reduced dose.  Also on stable ARB dose.  Beta-blocker was stopped during last hospitalization due to concerns of bradycardia.  Amlodipine dose was reduced due to hypotension.  Normal echo during her last hospitalization.  In the absence of any active symptoms, would probably avoid further testing unless symptoms warrant.

## 2019-11-26 NOTE — Assessment & Plan Note (Signed)
No labs available.  Has been followed by PCP.  Unfortunately, labs not available.  She is on reduced dose of simvastatin because of amlodipine.  Now that amlodipine dose is reduced, we can either increase simvastatin or change to a different statin altogether if lipids are not at goal.  LDL goal less than 70.

## 2019-11-26 NOTE — Assessment & Plan Note (Signed)
Medications were dropped-carvedilol stopped and amlodipine dose cut in half during last hospitalization.  There is concern for possible orthostatic symptoms and bradycardia.  Unfortunately do not have a blood pressure recorded for today.  For now we will continue with medicines as they are.

## 2019-11-26 NOTE — Assessment & Plan Note (Signed)
I am a little bit concerned about her having 2 significant falls in roughly 5 months between December and April.  The episode back in December was more concerning because of the head bleeds.  Each episode she did not recall what happened.  It is hard to tell what happened and whether it was syncope or loss of balance and then mild amnesia.  With this most recent episode she somehow ended up from the refrigerator into the bathroom with no recollection of what happened.  Her monitor really show any significant.  Echocardiogram was also normal.  If she is to have another episode similar to this, I think a loop recorder would probably be warranted is concern for either bradycardia and/or ventricular arrhythmias.  With no documented loss of consciousness, I think were okay, and I recommended that she limit her driving.

## 2019-11-28 ENCOUNTER — Other Ambulatory Visit: Payer: Self-pay | Admitting: Hematology and Oncology

## 2019-12-28 ENCOUNTER — Inpatient Hospital Stay: Payer: Medicare Other | Attending: Hematology and Oncology | Admitting: Hematology and Oncology

## 2019-12-28 ENCOUNTER — Ambulatory Visit: Payer: Medicare Other | Admitting: Hematology and Oncology

## 2019-12-28 NOTE — Assessment & Plan Note (Deleted)
Left lumpectomy 01/29/2016: IDC with DCIS, 1 cm, margins negative, fibrocystic changes,1/2 lymph nodes positive ( intramammary nodes)T1bN1 stage II a  Adjuvant radiation therapy completed 04/29/16  Treatment Plan: adjuvant antiestrogen therapy with Letrozole 2.5 mg daily started 05/07/16 Osteoporosis:On Prolia started 02/04/2017 along with calcium and vitamin D.  Patient has not received any further injections of Prolia since then. She does not wish to receive any further Prolia.  Letrozole toxicities: Occasional hot flashes but denies any problems  Breast cancer surveillance: 1.Breast exam 12/28/2019: Benign 2.mammograms 09/06/2017: Benign breast density category B Patient needs a new mammogram.  We will request it at the breast center.  Return to clinic in 1 year for follow-up

## 2020-02-06 DIAGNOSIS — E785 Hyperlipidemia, unspecified: Secondary | ICD-10-CM | POA: Diagnosis not present

## 2020-02-06 DIAGNOSIS — E039 Hypothyroidism, unspecified: Secondary | ICD-10-CM | POA: Diagnosis not present

## 2020-02-06 DIAGNOSIS — R7309 Other abnormal glucose: Secondary | ICD-10-CM | POA: Diagnosis not present

## 2020-02-06 DIAGNOSIS — E538 Deficiency of other specified B group vitamins: Secondary | ICD-10-CM | POA: Diagnosis not present

## 2020-02-06 DIAGNOSIS — I10 Essential (primary) hypertension: Secondary | ICD-10-CM | POA: Diagnosis not present

## 2020-02-06 DIAGNOSIS — E559 Vitamin D deficiency, unspecified: Secondary | ICD-10-CM | POA: Diagnosis not present

## 2020-02-13 DIAGNOSIS — E785 Hyperlipidemia, unspecified: Secondary | ICD-10-CM | POA: Diagnosis not present

## 2020-02-13 DIAGNOSIS — I209 Angina pectoris, unspecified: Secondary | ICD-10-CM | POA: Diagnosis not present

## 2020-02-13 DIAGNOSIS — Z23 Encounter for immunization: Secondary | ICD-10-CM | POA: Diagnosis not present

## 2020-02-13 DIAGNOSIS — R7309 Other abnormal glucose: Secondary | ICD-10-CM | POA: Diagnosis not present

## 2020-02-13 DIAGNOSIS — E538 Deficiency of other specified B group vitamins: Secondary | ICD-10-CM | POA: Diagnosis not present

## 2020-02-13 DIAGNOSIS — I129 Hypertensive chronic kidney disease with stage 1 through stage 4 chronic kidney disease, or unspecified chronic kidney disease: Secondary | ICD-10-CM | POA: Diagnosis not present

## 2020-02-23 ENCOUNTER — Other Ambulatory Visit: Payer: Self-pay | Admitting: Oncology

## 2020-03-18 DIAGNOSIS — R0789 Other chest pain: Secondary | ICD-10-CM | POA: Diagnosis not present

## 2020-03-18 DIAGNOSIS — R079 Chest pain, unspecified: Secondary | ICD-10-CM | POA: Diagnosis not present

## 2020-03-18 DIAGNOSIS — R634 Abnormal weight loss: Secondary | ICD-10-CM | POA: Diagnosis not present

## 2020-03-18 DIAGNOSIS — R739 Hyperglycemia, unspecified: Secondary | ICD-10-CM | POA: Diagnosis not present

## 2020-03-18 DIAGNOSIS — J3489 Other specified disorders of nose and nasal sinuses: Secondary | ICD-10-CM | POA: Diagnosis not present

## 2020-04-03 DIAGNOSIS — J310 Chronic rhinitis: Secondary | ICD-10-CM | POA: Diagnosis not present

## 2020-04-03 DIAGNOSIS — J343 Hypertrophy of nasal turbinates: Secondary | ICD-10-CM | POA: Diagnosis not present

## 2020-04-03 DIAGNOSIS — R0982 Postnasal drip: Secondary | ICD-10-CM | POA: Diagnosis not present

## 2020-05-07 DIAGNOSIS — I129 Hypertensive chronic kidney disease with stage 1 through stage 4 chronic kidney disease, or unspecified chronic kidney disease: Secondary | ICD-10-CM | POA: Diagnosis not present

## 2020-05-07 DIAGNOSIS — E559 Vitamin D deficiency, unspecified: Secondary | ICD-10-CM | POA: Diagnosis not present

## 2020-05-07 DIAGNOSIS — E785 Hyperlipidemia, unspecified: Secondary | ICD-10-CM | POA: Diagnosis not present

## 2020-05-22 ENCOUNTER — Other Ambulatory Visit: Payer: Self-pay | Admitting: Hematology and Oncology

## 2020-05-29 DIAGNOSIS — I129 Hypertensive chronic kidney disease with stage 1 through stage 4 chronic kidney disease, or unspecified chronic kidney disease: Secondary | ICD-10-CM | POA: Diagnosis not present

## 2020-05-29 DIAGNOSIS — J300 Vasomotor rhinitis: Secondary | ICD-10-CM | POA: Diagnosis not present

## 2020-05-29 DIAGNOSIS — R7309 Other abnormal glucose: Secondary | ICD-10-CM | POA: Diagnosis not present

## 2020-05-29 DIAGNOSIS — E785 Hyperlipidemia, unspecified: Secondary | ICD-10-CM | POA: Diagnosis not present

## 2020-05-29 DIAGNOSIS — D709 Neutropenia, unspecified: Secondary | ICD-10-CM | POA: Diagnosis not present

## 2020-05-29 DIAGNOSIS — I2510 Atherosclerotic heart disease of native coronary artery without angina pectoris: Secondary | ICD-10-CM | POA: Diagnosis not present

## 2020-05-29 DIAGNOSIS — H3552 Pigmentary retinal dystrophy: Secondary | ICD-10-CM | POA: Diagnosis not present

## 2020-05-29 DIAGNOSIS — Z0000 Encounter for general adult medical examination without abnormal findings: Secondary | ICD-10-CM | POA: Diagnosis not present

## 2020-06-04 ENCOUNTER — Other Ambulatory Visit: Payer: Self-pay

## 2020-06-04 ENCOUNTER — Ambulatory Visit: Payer: Medicare Other | Attending: Internal Medicine

## 2020-06-04 DIAGNOSIS — Z23 Encounter for immunization: Secondary | ICD-10-CM

## 2020-06-04 NOTE — Progress Notes (Signed)
   Covid-19 Vaccination Clinic  Name:  Naileah Karg    MRN: [id number] DOB: 1933/02/28  06/04/2020  Ms. Wetherell was observed post Covid-19 immunization for 15 minutes without incident. She was provided with Vaccine Information Sheet and instruction to access the V-Safe system.   Ms. Ebey was instructed to call 911 with any severe reactions post vaccine: Marland Kitchen Difficulty breathing  . Swelling of face and throat  . A fast heartbeat  . A bad rash all over body  . Dizziness and weakness   Immunizations Administered    Name Date Dose VIS Date Route   PFIZER Comrnaty([place] TOP) Covid-19 Vaccine 06/04/2020  1:55 PM 0.3 mL 04/04/2020 Intramuscular   Manufacturer: [place]   Lot: [id number]   [place]: [phone]

## 2020-06-21 ENCOUNTER — Other Ambulatory Visit: Payer: Self-pay | Admitting: Hematology and Oncology

## 2020-07-02 ENCOUNTER — Telehealth: Payer: Self-pay

## 2020-07-02 NOTE — Telephone Encounter (Signed)
Patient's husband called with concerns about patient continuing on medication, Letrozole.   Last office visit for patient was back in 2020.  RN encouraged patient's husband that best practice would be to have patient be evaluated by MD in office for follow up.   Husband was very agreeable and appreciated the recommendation.   Scheduling message sent to have patient scheduled.

## 2020-07-03 ENCOUNTER — Telehealth: Payer: Self-pay | Admitting: Hematology and Oncology

## 2020-07-03 NOTE — Telephone Encounter (Signed)
Scheduled appointment per 03/08 schedule message. Contacted patients husband, patient is aware.

## 2020-07-09 NOTE — Assessment & Plan Note (Signed)
Left lumpectomy 01/29/2016: IDC with DCIS, 1 cm, margins negative, fibrocystic changes,1/2 lymph nodes positive ( intramammary nodes)T1bN1 stage II a  Adjuvant radiation therapy completed 04/29/16  Treatment Plan: adjuvant antiestrogen therapy with Letrozole 2.5 mg daily started 05/07/16 Osteoporosis:On Prolia started 02/04/2017 along with calcium and vitamin D.  Patient has not received any further injections of Prolia since then. She does not wish to receive any further Prolia.  Letrozole toxicities: Occasional hot flashes but denies any problems  Breast cancer surveillance: 1.Breast exam 07/10/2020: Benign 2.mammograms 09/06/2017: Benign breast density category B Patient needs a new mammogram.  Return to clinic in 1 year for follow-up

## 2020-07-09 NOTE — Progress Notes (Signed)
Patient Care Team: Jani Gravel, MD as PCP - General (Internal Medicine) Leonie Man, MD as PCP - Cardiology (Cardiology) [name], MD as Consulting Physician (General Surgery) Nicholas Lose, MD as Consulting Physician (Hematology and Oncology) Gery Pray, MD as Consulting Physician (Radiation Oncology) Gardenia Phlegm, NP as Nurse Practitioner (Hematology and Oncology) Jani Gravel, MD (Internal Medicine)  DIAGNOSIS:    ICD-10-CM   1. Malignant neoplasm of upper-outer quadrant of left breast in female, estrogen receptor positive (Olivia Gray)  C50.412    Z17.0     SUMMARY OF ONCOLOGIC HISTORY: Oncology History  Breast cancer of upper-outer quadrant of left female breast (Olivia Gray)  12/26/2015 Initial Diagnosis   Left breast biopsy 1:00: Invasive ductal carcinoma grade 1, DCIS,ER 95%, PR 95%, Ki-67 10%, HER-2 negative ratio 1.12; left breast mass 1:00 position 7 mm size axilla ultrasound negative, T1 BN 0 stage IA clinical stage   01/29/2016 Surgery   Left lumpectomy: IDC with DCIS, 1 cm, margins negative, fibrocystic changes,1/2 lymph nodes positive ( intramammary nodes)T1bN1 stage II a   03/11/2016 - 04/29/2016 Radiation Therapy   Adj XRT ([facility]): 1)Left breast/axilla / 50.4 Gy in 28 fractions  2) Left breast boost / 10 Gy in 5 fractions.     05/07/2016 -  Anti-estrogen oral therapy   Letrozole 2.5 mg daily along with Prolia every 6 months     CHIEF COMPLIANT: Follow-up of left breast cancer on letrozole therapy  INTERVAL HISTORY: Olivia Gray is a 87 y.o. with above-mentioned history of left breast cancer treated with lumpectomy, radiation, and who is currently on letrozole therapy. She presents to the clinic today for annual follow-up.     ALLERGIES:  has No Known Allergies.  MEDICATIONS:  Current Outpatient Medications  Medication Sig Dispense Refill   fexofenadine (ALLEGRA) 180 MG tablet Take 1 tablet (180 mg total) by mouth daily.     montelukast  (SINGULAIR) 10 MG tablet Take 1 tablet (10 mg total) by mouth at bedtime.     temazepam (RESTORIL) 15 MG capsule Take 1 capsule (15 mg total) by mouth at bedtime as needed for sleep. 30 capsule 0   amLODipine (NORVASC) 10 MG tablet TAKE 1 TABLET BY MOUTH EVERY DAY 90 tablet 3   aspirin 81 MG chewable tablet Chew 1 tablet (81 mg total) by mouth daily.     D 1000 25 MCG (1000 UT) capsule Take 1,000 Units by mouth daily.     escitalopram (LEXAPRO) 10 MG tablet Take 10 mg by mouth daily.     latanoprost (XALATAN) 0.005 % ophthalmic solution      letrozole (FEMARA) 2.5 MG tablet Take 1 tablet (2.5 mg total) by mouth daily. 90 tablet 3   nitroGLYCERIN (NITROSTAT) 0.4 MG SL tablet Place 1 tablet (0.4 mg total) under the tongue every 5 (five) minutes as needed for chest pain. 25 tablet 6   PRESCRIPTION MEDICATION Place 1 drop into both eyes daily. Medication for eye pressure. ( Patient don't know the name)     simvastatin (ZOCOR) 10 MG tablet Take 10 mg by mouth daily.     valsartan (DIOVAN) 320 MG tablet TAKE 1 TABLET DAILY. TO REPLACE IRBESARTAN. 90 tablet 3   vitamin B-12 (CYANOCOBALAMIN) 100 MCG tablet Take 100 mcg by mouth daily.     No current facility-administered medications for this visit.    PHYSICAL EXAMINATION: ECOG PERFORMANCE STATUS: 1 - Symptomatic but completely ambulatory  Vitals:   07/10/20 1322  BP: (!) 184/55  Pulse: 68  Resp: 17  Temp: 97.7 F (36.5 C)  SpO2: 100%   Filed Weights   07/10/20 1322  Weight: 122 lb 3.2 oz (55.4 kg)    BREAST: No palpable masses or nodules in either right or left breasts. No palpable axillary supraclavicular or infraclavicular adenopathy no breast tenderness or nipple discharge. (exam performed in the presence of a chaperone)  LABORATORY DATA:  I have reviewed the data as listed CMP Latest Ref Rng & Units 08/26/2019 08/25/2019 08/24/2019  Glucose 70 - 99 mg/dL 99 105(H) 106(H)  BUN 8 - 23 mg/dL _0 Creatinine 0.44 - 1.00  mg/dL 0.76 0.85 0.92  Sodium 135 - 145 mmol/L 140 139 140  Potassium 3.5 - 5.1 mmol/L 3.9 4.1 3.7  Chloride 98 - 111 mmol/L 109 110 108  CO2 22 - 32 mmol/L _1 Calcium 8.9 - 10.3 mg/dL 8.7(L) 8.6(L) 8.6(L)  Total Protein 6.5 - 8.1 g/dL - - -  Total Bilirubin 0.3 - 1.2 mg/dL - - -  Alkaline Phos 38 - 126 U/L - - -  AST 15 - 41 U/L - - -  ALT 0 - 44 U/L - - -    Lab Results  Component Value Date   WBC 4.4 08/26/2019   HGB 10.4 (L) 08/26/2019   HCT 31.9 (L) 08/26/2019   MCV 89.9 08/26/2019   PLT 160 08/26/2019   NEUTROABS 2.2 12/27/2018    ASSESSMENT & PLAN:  Breast cancer of upper-outer quadrant of left female breast ([place]) Left lumpectomy 01/29/2016: IDC with DCIS, 1 cm, margins negative, fibrocystic changes,1/2 lymph nodes positive ( intramammary nodes)T1bN1 stage II a  Adjuvant radiation therapy completed 04/29/16  Treatment Plan: adjuvant antiestrogen therapy with Letrozole 2.5 mg daily started 05/07/16 Osteoporosis:On Prolia started 02/04/2017 along with calcium and vitamin D.  Patient has not received any further injections of Prolia since then. She does not wish to receive any further Prolia.  Letrozole toxicities: Occasional hot flashes but denies any problems  Breast cancer surveillance: 1.Breast exam 07/10/2020: Benign 2.mammograms 09/06/2017: Benign breast density category B Patient needs a new mammogram.  Return to clinic in 1 year for follow-up    No orders of the defined types were placed in this encounter.  The patient has a good understanding of the overall plan. she agrees with it. she will call with any problems that may develop before the next visit here.  Total time spent: 20 mins including face to face time and time spent for planning, charting and coordination of care  Olivia Eisenmenger, MD, MPH 07/10/2020  I, Olivia Gray, am acting as scribe for Dr. Nicholas Lose.  I have reviewed the above documentation for accuracy and  completeness, and I agree with the above.

## 2020-07-10 ENCOUNTER — Other Ambulatory Visit: Payer: Self-pay

## 2020-07-10 ENCOUNTER — Inpatient Hospital Stay: Payer: Medicare Other | Attending: Hematology and Oncology | Admitting: Hematology and Oncology

## 2020-07-10 ENCOUNTER — Telehealth: Payer: Self-pay | Admitting: Hematology and Oncology

## 2020-07-10 DIAGNOSIS — Z79811 Long term (current) use of aromatase inhibitors: Secondary | ICD-10-CM | POA: Diagnosis not present

## 2020-07-10 DIAGNOSIS — C50412 Malignant neoplasm of upper-outer quadrant of left female breast: Secondary | ICD-10-CM | POA: Diagnosis not present

## 2020-07-10 DIAGNOSIS — Z170 Estrogen receptor positive status [ER+]: Secondary | ICD-10-CM | POA: Insufficient documentation

## 2020-07-10 DIAGNOSIS — Z923 Personal history of irradiation: Secondary | ICD-10-CM | POA: Diagnosis not present

## 2020-07-10 MED ORDER — MONTELUKAST SODIUM 10 MG PO TABS: 10.0000 mg | ORAL_TABLET | Freq: Every day | ORAL | Status: DC

## 2020-07-10 MED ORDER — FEXOFENADINE HCL 180 MG PO TABS: 180.0000 mg | ORAL_TABLET | Freq: Every day | ORAL | Status: DC

## 2020-07-10 MED ORDER — TEMAZEPAM 15 MG PO CAPS: 15.0000 mg | ORAL_CAPSULE | Freq: Every evening | ORAL | 0 refills | Status: DC | PRN

## 2020-07-10 MED ORDER — LETROZOLE 2.5 MG PO TABS: 2.5000 mg | ORAL_TABLET | Freq: Every day | ORAL | 3 refills | Status: DC

## 2020-07-10 NOTE — Telephone Encounter (Signed)
Scheduled appointment per 3/16 los. Spoke to patient who is aware of appointment date and time.

## 2020-07-23 DIAGNOSIS — H6123 Impacted cerumen, bilateral: Secondary | ICD-10-CM | POA: Diagnosis not present

## 2020-09-29 IMAGING — CT CT HEAD W/O CM
4 series · 16 of 47 positions shown, 18 images · non-contrast
Comparison: Head CT 04/09/2019

CLINICAL DATA: Confusion, follow-up subdural hematoma; subdural
hemorrhage.

EXAM:
CT HEAD WITHOUT CONTRAST
TECHNIQUE: Contiguous axial images were obtained from the base of the skull
through the vertex without intravenous contrast.

[Series 3: head without · axial · non-contrast · 0.43mm/px · z∈[-107,+28]mm · 7 of 37 slices shown, 9 images]
[im 5/37  brain]
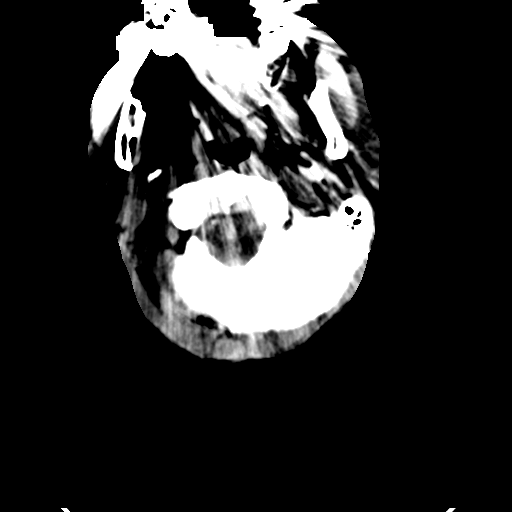
[im 5/37  bone]
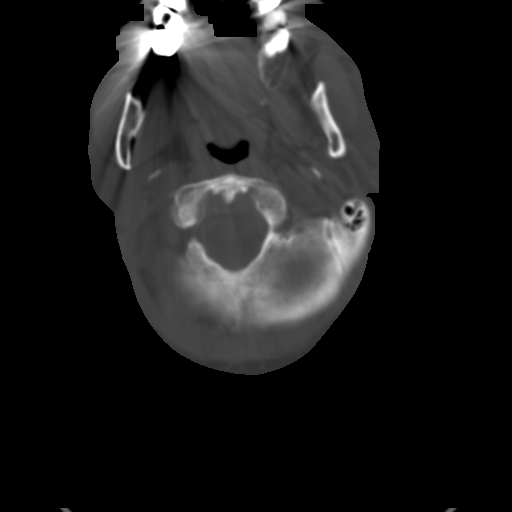
[im 10/37  brain]
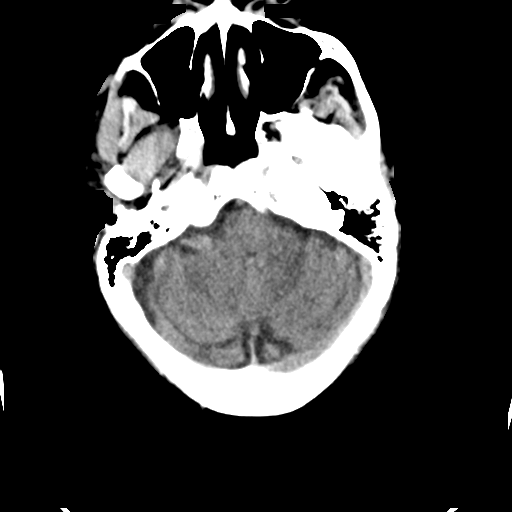
[im 14/37  brain]
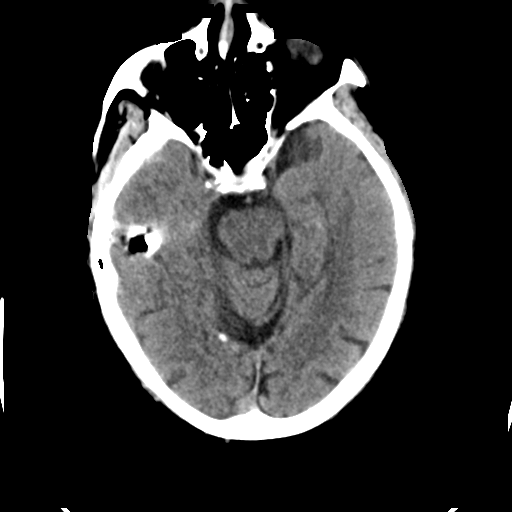
[im 19/37  brain]
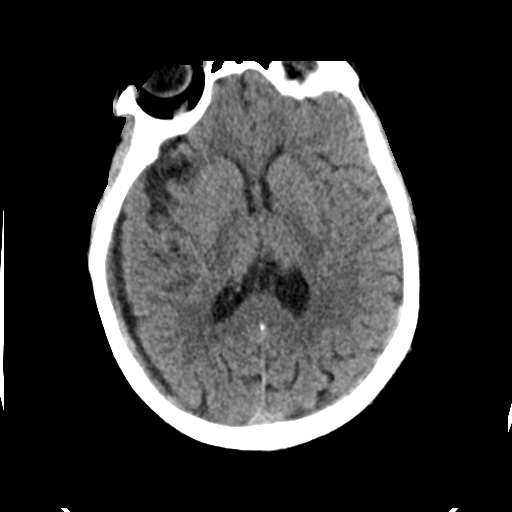
[im 23/37  brain]
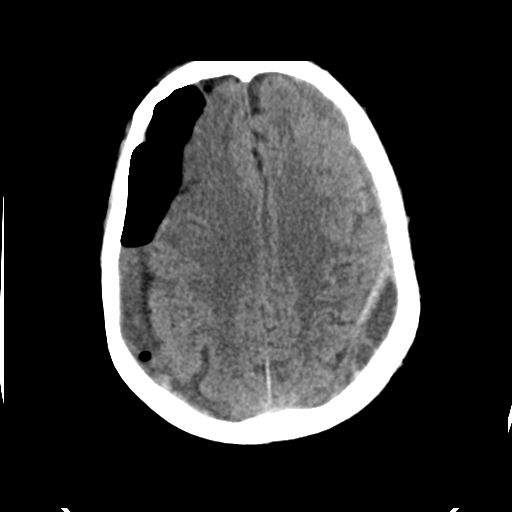
[im 23/37  bone]
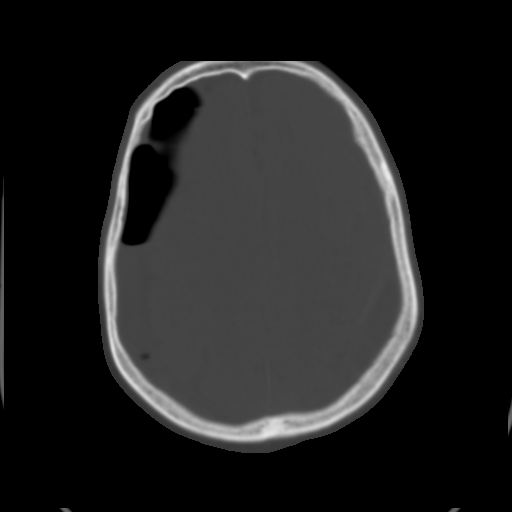
[im 28/37  brain]
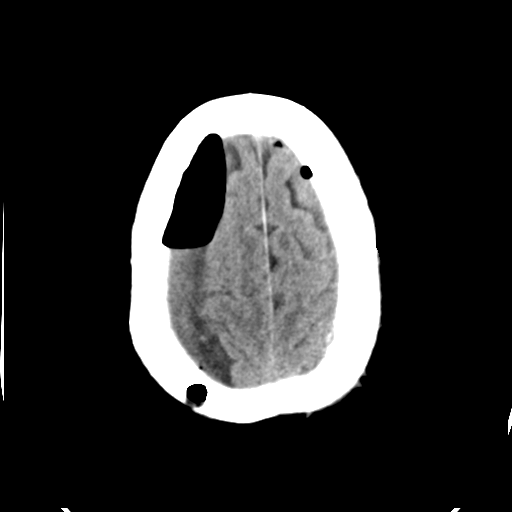
[im 32/37  brain]
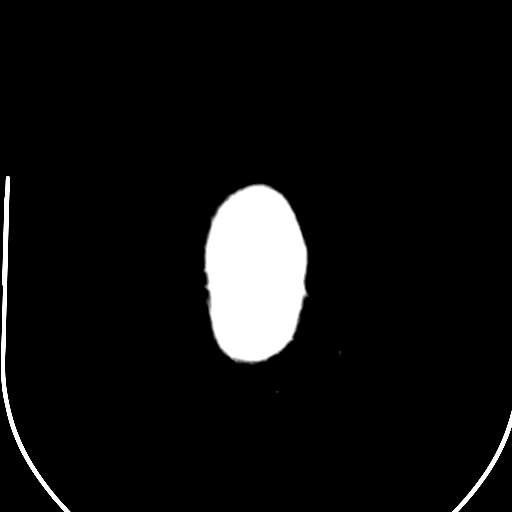

[Series 4: head bone · axial · 0.43mm/px · z∈[-109,-73]mm · 3 of 91 slices shown]
[im 10/91  bone]
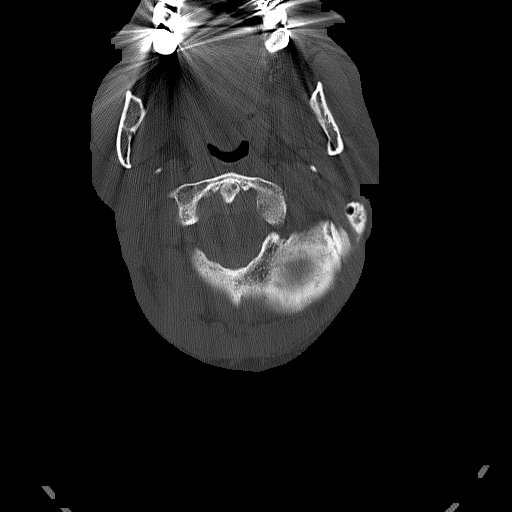
[im 19/91  bone]
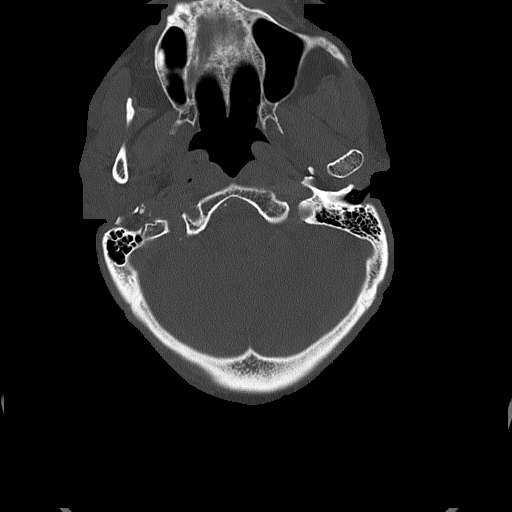
[im 28/91  bone]
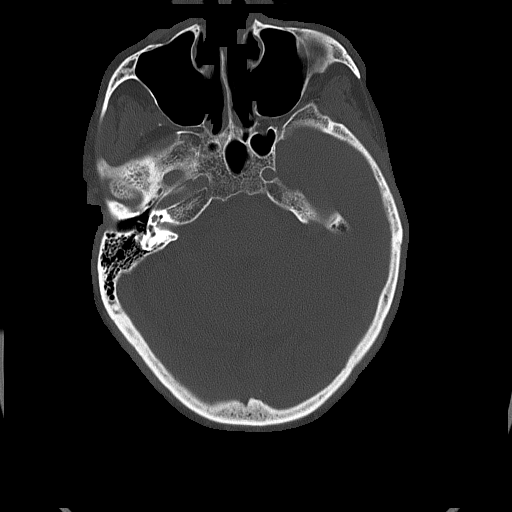

[Series 5: head without cor · coronal · non-contrast · 0.33mm/px · 3 of 69 slices shown]
[im 23/69  brain]
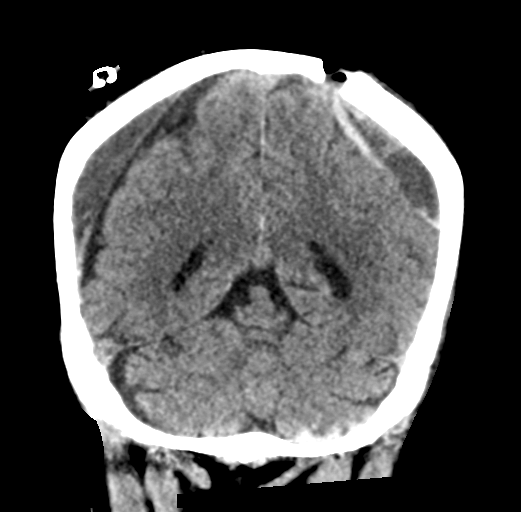
[im 31/69  brain]
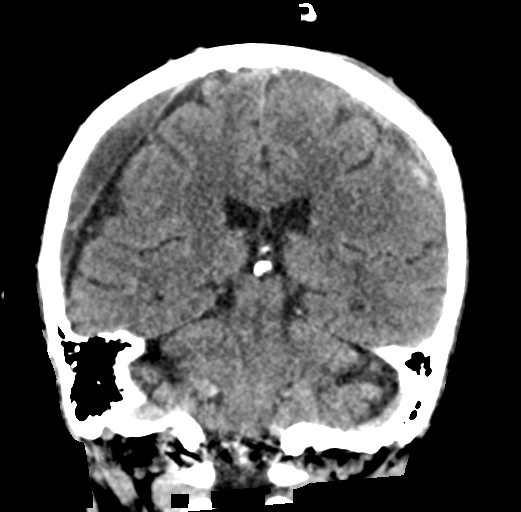
[im 38/69  brain]
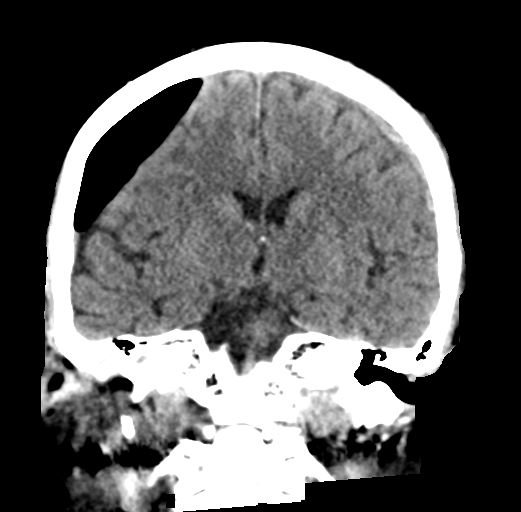

[Series 6: head without sag · sagittal · non-contrast · 0.35mm/px · 3 of 62 slices shown]
[im 21/62  brain]
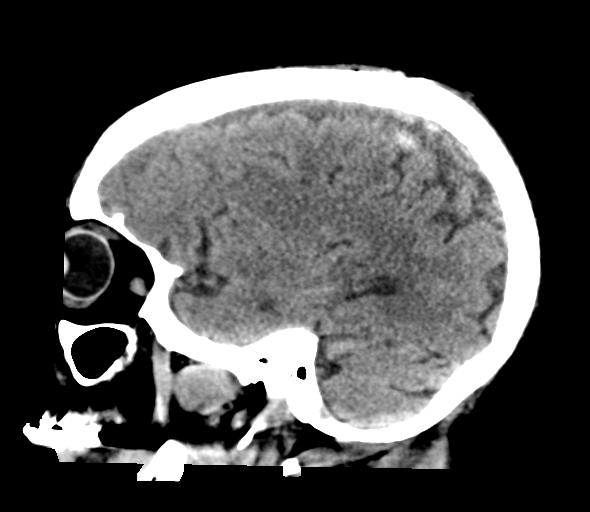
[im 31/62  brain]
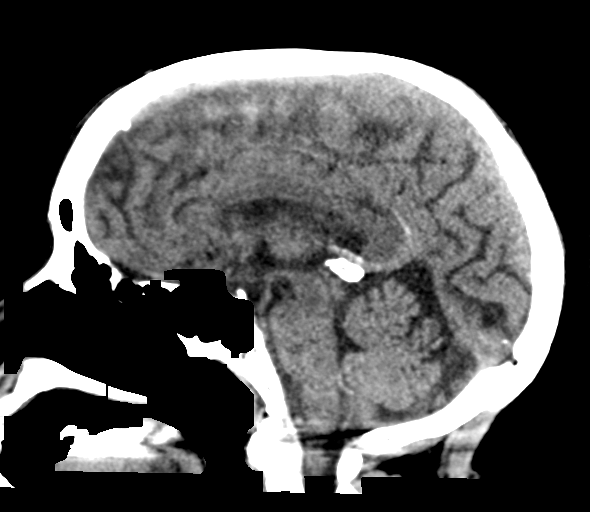
[im 41/62  brain]
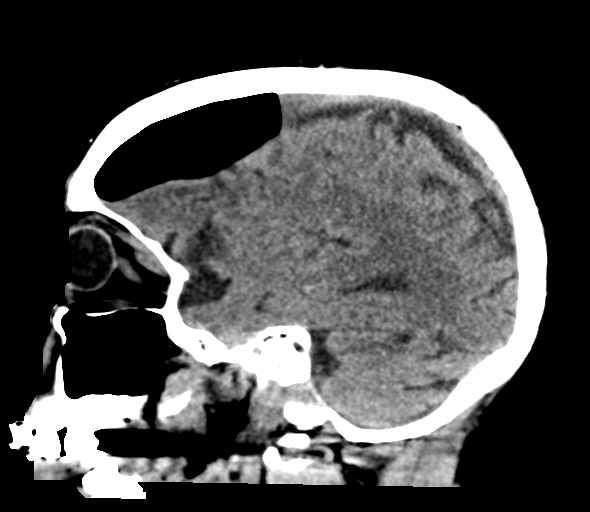

[16 of 47 positions shown; findings below may reference images not displayed]

FINDINGS: Brain:

Again demonstrated are bilateral parietal burr holes. Interval
removal of previously demonstrated bilateral subdural drains.

There is persistent moderate pneumocephalus overlying the right
frontal lobe with mass effect upon the underlying right cerebral
hemisphere. Fluid components of a predominantly hypodense
right-sided subdural collection more posteriorly have not
significantly changed, again measuring up to 11 mm in thickness.
Persistent small foci of acute hemorrhage within this collection.

Unchanged size of a mixed density left holo hemispheric subdural
hematoma, again measuring up to 14 mm in greatest thickness
posteriorly (series 5, image 48) (remeasured on prior). Persistent
mass effect upon the underlying left cerebral hemisphere.

Partial effacement of the lateral ventricles with unchanged leftward
midline shift measuring 4 mm at the level of the septum pellucidum.

No evidence of interval intracranial hemorrhage.

Vascular: No hyperdense vessel.  Atherosclerotic calcifications.

Skull: No calvarial fracture.  Bilateral burr holes as described.

Sinuses/Orbits: Visualized orbits demonstrate no acute abnormality.
No significant paranasal sinus disease or mastoid effusion at the
imaged levels.
IMPRESSION: Interval removal of previously demonstrated bilateral subdural
drains.

Unchanged moderate pneumocephalus overlying the right frontal lobe.
Associated predominantly low-density right subdural collection
containing some acute blood products more posteriorly. Fluid
components of the subdural collection are unchanged, again measuring
11 mm in greatest thickness. Unchanged mass effect upon the right
cerebral hemisphere with 4 mm leftward midline shift.

Unchanged mixed density left holohemispheric subdural hematoma again
14 mm in greatest thickness, with mass effect upon the underlying
left cerebral hemisphere.

No significant interval intracranial hemorrhage.

## 2020-10-17 DIAGNOSIS — J310 Chronic rhinitis: Secondary | ICD-10-CM | POA: Diagnosis not present

## 2020-11-12 ENCOUNTER — Other Ambulatory Visit: Payer: Self-pay | Admitting: Physician Assistant

## 2020-12-11 ENCOUNTER — Telehealth: Payer: Self-pay | Admitting: *Deleted

## 2020-12-11 NOTE — Telephone Encounter (Signed)
Received vm call from pt's spouse stating they have some questions about purchasing some equipment.   Returned call & he reports that pt had surgery couple of years ago & hasn't had need for new bras since not getting out but now she wants to get out some & wants to know where she can get bras.  Informed of [facility] address & phone #.  He expressed understanding.

## 2020-12-24 ENCOUNTER — Other Ambulatory Visit: Payer: Self-pay | Admitting: Hematology and Oncology

## 2021-01-14 DIAGNOSIS — I129 Hypertensive chronic kidney disease with stage 1 through stage 4 chronic kidney disease, or unspecified chronic kidney disease: Secondary | ICD-10-CM | POA: Diagnosis not present

## 2021-01-14 DIAGNOSIS — E039 Hypothyroidism, unspecified: Secondary | ICD-10-CM | POA: Diagnosis not present

## 2021-01-14 DIAGNOSIS — E785 Hyperlipidemia, unspecified: Secondary | ICD-10-CM | POA: Diagnosis not present

## 2021-01-14 DIAGNOSIS — R7309 Other abnormal glucose: Secondary | ICD-10-CM | POA: Diagnosis not present

## 2021-01-15 DIAGNOSIS — I129 Hypertensive chronic kidney disease with stage 1 through stage 4 chronic kidney disease, or unspecified chronic kidney disease: Secondary | ICD-10-CM | POA: Diagnosis not present

## 2021-01-20 DIAGNOSIS — J310 Chronic rhinitis: Secondary | ICD-10-CM | POA: Diagnosis not present

## 2021-01-20 DIAGNOSIS — J329 Chronic sinusitis, unspecified: Secondary | ICD-10-CM | POA: Diagnosis not present

## 2021-01-21 DIAGNOSIS — I129 Hypertensive chronic kidney disease with stage 1 through stage 4 chronic kidney disease, or unspecified chronic kidney disease: Secondary | ICD-10-CM | POA: Diagnosis not present

## 2021-01-21 DIAGNOSIS — R7309 Other abnormal glucose: Secondary | ICD-10-CM | POA: Diagnosis not present

## 2021-01-21 DIAGNOSIS — N1831 Chronic kidney disease, stage 3a: Secondary | ICD-10-CM | POA: Diagnosis not present

## 2021-01-21 DIAGNOSIS — H6123 Impacted cerumen, bilateral: Secondary | ICD-10-CM | POA: Diagnosis not present

## 2021-01-21 DIAGNOSIS — Z951 Presence of aortocoronary bypass graft: Secondary | ICD-10-CM | POA: Diagnosis not present

## 2021-01-21 DIAGNOSIS — H548 Legal blindness, as defined in USA: Secondary | ICD-10-CM | POA: Diagnosis not present

## 2021-01-21 DIAGNOSIS — E785 Hyperlipidemia, unspecified: Secondary | ICD-10-CM | POA: Diagnosis not present

## 2021-01-21 DIAGNOSIS — R634 Abnormal weight loss: Secondary | ICD-10-CM | POA: Diagnosis not present

## 2021-01-21 DIAGNOSIS — I2510 Atherosclerotic heart disease of native coronary artery without angina pectoris: Secondary | ICD-10-CM | POA: Diagnosis not present

## 2021-04-07 DIAGNOSIS — J310 Chronic rhinitis: Secondary | ICD-10-CM | POA: Diagnosis not present

## 2021-04-07 DIAGNOSIS — J329 Chronic sinusitis, unspecified: Secondary | ICD-10-CM | POA: Diagnosis not present

## 2021-04-16 ENCOUNTER — Telehealth: Payer: Self-pay

## 2021-04-16 NOTE — Telephone Encounter (Signed)
Primary Cardiologist:David Ellyn Hack, MD  Chart reviewed as part of pre-operative protocol coverage. Because of Olivia Gray's past medical history and time since last visit, he/she will require a follow-up visit in order to better assess preoperative cardiovascular risk.  Pre-op covering staff: - Please schedule appointment and call patient to inform them. - Please contact requesting surgeon's office via preferred method (i.e, phone, fax) to inform them of need for appointment prior to surgery.  If applicable, this message will also be routed to pharmacy pool and/or primary cardiologist for input on holding anticoagulant/antiplatelet agent as requested below so that this information is available at time of patient's appointment.   Deberah Pelton, NP  04/16/2021, 10:17 AM

## 2021-04-16 NOTE — Telephone Encounter (Signed)
° °  Pre-operative Risk Assessment    Patient Name: Olivia Gray  DOB: [birth year] MRN: [id number]      Request for Surgical Clearance    Procedure:   ABLATION OF POSTERIOR NASL NERVE-BOTH SIDES  Date of Surgery:  Clearance TBD                                 Surgeon:  DR Lawson Fiscal Surgeon's Group or Practice Name:  [facility] Phone number:  [phone] Fax number:  [phone]   Type of Clearance Requested:   - Medical  - Pharmacy:  Hold Aspirin  TBD   Type of Anesthesia:  Not Indicated   Additional requests/questions:   MRN [id number]

## 2021-04-22 NOTE — Telephone Encounter (Signed)
Pt is scheduled to see Laurann Montana, NP on 05/12/21 for preop clearance.

## 2021-05-11 NOTE — Progress Notes (Addendum)
Office Visit    Patient Name: Olivia Gray Date of Encounter: 05/12/2021  PCP:  Janie Morning, [facility]  Cardiologist:  Glenetta Hew, MD  Advanced Practice Provider:  No care team member to display Electrophysiologist:  None    Chief Complaint    Olivia Gray is a 88 y.o. female with a hx of CAD status post CABG x4 in 2004 (SVG to LAD, SVG to D1, SVG to OM1, SVG to R PDA), breast CA status post chemo/radiation therapy, HTN, HDL, prediabetes, legally blind and mitral valve prolapse presents today for preop clearance.   Past Medical History    Past Medical History:  Diagnosis Date   Breast cancer ([place]) 01/29/2016   Left Breast Cancer   Breast cancer of upper-outer quadrant of left female breast ([place]) 12/27/2015   CAD, multiple vessel 2004   Myoview 2011: Mild Basal-mid Inferolateral "ischemia", EF ~76?% -- Anormal, but LOW RISK   Cancer ([place])    Essential hypertension    History of radiation therapy 03/11/16-04/29/16   left breast/axilla 50.4 Gy in 28 fractions, boost of 10 Gy in 5 fractions   Hyperlipidemia LDL goal <70    On statin.   Legally blind    Mitral valve prolapse 02/07/2008; April 2015   a) Mild Anterior MVP, with mild MR.;; b) Echo 07/2013: EF 55-60%, Gr 1 DD, no WMA, Mild central MR with late systolic prolapse    Personal history of radiation therapy 2017   Left Breast Cancer   Pre-diabetes    Retinitis pigmentosa of both eyes     now essentially legally blind.  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 (Native LIMA was 100% occluded)   Past Surgical History:  Procedure Laterality Date   BRAIN SURGERY     BREAST LUMPECTOMY Left 01/29/2016   BREAST LUMPECTOMY WITH RADIOACTIVE SEED LOCALIZATION Left 01/29/2016   Procedure: LEFT BREAST LUMPECTOMY WITH RADIOACTIVE  SEED LOCALIZATION;  Surgeon: [name], MD;  Location: [facility];  Service: General;  Laterality: Left;   BREAST SURGERY Bilateral    cyst removal   CARDIAC CATHETERIZATION  05/22/2009   all grafts patent ,LV systolic fx normal,small -vessel diabetic disease in native vessels    CARDIAC SURGERY     CHOLECYSTECTOMY  02/05/2005   CORONARY ARTERY BYPASS GRAFT  07/21/2002   in [place] ; SVG-LAD, SVG-D1, SVG-OM1, SVG RPDA.   CRANIOTOMY Bilateral 04/09/2019   Procedure: BILATERAL BURR HOLES FOR EVACUATION SUBDURAL HEMATOMA;  Surgeon: Judith Part, MD;  Location: [facility];  Service: Neurosurgery;  Laterality: Bilateral;   NM MYOCAR PERF WALL MOTION  04/2009   EF 76%; mild ischeia basal inferolateral,mid inferoinlateral regions(s), abn pharmacologic nuc test, deffect indicte new ischemia,LV syst.fx normal --> cath showing no obstructive targets.   NM MYOVIEW LTD  June 2016   Normal perfusion. Normal study. LOW RISK. Normal EF (55%). Compared to prior study, no ischemic changes present.   TRANSTHORACIC ECHOCARDIOGRAM  4/20221    EF 50 and 55%.  No R WMA.  Moderate LAE.  Mild to moderate aortic sclerosis but no stenosis.    Allergies  No Known Allergies  History of Present Illness    Olivia Gray is a 88 y.o. female with a hx of CAD status post CABG x4  in 2004 (SVG to LAD, SVG to D1, SVG to OM1, SVG to R PDA), breast CA status post chemo/radiation therapy, HTN, HDL, prediabetes, legally blind and mitral valve prolapse presents today for preop clearance last seen 09/20/2019.  She had a Myoview in 2016 which was negative for ischemia.  The patient was seen virtually by Dr. Ellyn Hack on 11/11/2018 at which time she was doing well.  She was admitted for seizures in December 2020 after being found down on the floor by her husband obtunded with some clonic movement and right gaze.  CT of the head showed bilateral subdural hematomas.  She was taken to the OR for bilateral bur holes  and drainage of SDH with drain placement.  More recently, the patient was admitted in April 2021 after falling down a flight of stairs and suffering multiple injuries.  During her hospitalization she was noted to be bradycardic with irregular heart rhythm.  EKG demonstrated sinus rhythm with bigeminy and PACs.  Her heart rate was in the 60s however some reported history down to the 40s.  Carvedilol was discontinued.  Amlodipine was reduced to 5 mg daily.  Echocardiogram performed 08/25/2019 showed EF 55 to 60%, moderate LAE.  She was discharged on a Zio patch.   When she was seen back in the clinic on 09/20/2019 her Zio patch was reviewed.  Her heart rate ranged from 45-1 70s.  She denied feeling dizzy or lightheaded.  She had multiple episodes of short bursts of atrial tachycardia versus SVT.  These episodes were very transient and only lasted 7 beats or less.  Blood pressure had been stable.  She presents today for preop clearance for her ablation of posterior nasal nerve bilaterally.  They are requesting holding aspirin for this procedure.  She has not had any palpitations.  She denies any shortness of breath but does have chest pain occasionally with coughing.  She does not have any lower extremity edema but does endorse some tingling in her legs which is most likely neuropathy.  She denies any symptoms of claudication.  She does not usually take her blood pressure at home but she is well controlled today in the clinic 118/48.  She denies any lightheadedness or dizziness.  She has not had any headaches.  She is here today for cardiac clearance for her posterior nasal nerve ablation.  We reviewed her EKG which showed normal sinus rhythm in the 60s with occasional PACs.  This is not new for her.  She also has a 2/6 systolic murmur which has been reported back to 2019.  We reviewed the RCRI and Duke activity index and she does meet the required METS.  We discussed the possible need for head CT surveillance for  previous subdural hematoma back in 2020.  Since she is asymptomatic from a cardiac standpoint it should be fine to go forward with her nasal surgery with the acceptable risk.  Reports no shortness of breath nor dyspnea on exertion. Reports no chest pain, pressure, or tightness. No edema, orthopnea, PND. Reports no palpitations.     EKGs/Labs/Other Studies Reviewed:   The following studies were reviewed today:  Echocardiogram 08/25/2019  IMPRESSIONS     1. Left ventricular ejection fraction, by estimation, is 55 to 60%. The  left ventricle has normal function. The left ventricle has no regional  wall motion abnormalities. Left ventricular diastolic parameters were  normal.   2. Right ventricular systolic function is normal. The right ventricular  size is normal.   3.  Left atrial size was moderately dilated.   4. The mitral valve is normal in structure. Trivial mitral valve  regurgitation. No evidence of mitral stenosis.   5. The aortic valve is tricuspid. Aortic valve regurgitation is mild.  Mild to moderate aortic valve sclerosis/calcification is present, without  any evidence of aortic stenosis.   6. The inferior vena cava is normal in size with greater than 50%  respiratory variability, suggesting right atrial pressure of 3 mmHg.   09/19/19 Long-term monitor  Mostly sinus rhythm with minimum heart rate 45 bpm, maximum 125 bpm. Average 70 bpm 10 brief episodes of SVT-PAT: Longest and fastest was 7 beats with a max rate of 1 7 1  bpm. Frequent isolated PACs. (12.2%) rare couplets and triplets noted. Occasional isolated PVCs (1.5%) with rare couplets and triplets. Ventricular trigeminy noted lasting 6.5 seconds.   Heart rate 45 bpm occurred at 10 AM     No symptoms.   EKG:  EKG is  ordered today.  The ekg ordered today demonstrates NSR with PACs  Recent Labs: No results found for requested labs within last 8760 hours.  Recent Lipid Panel No results found for: CHOL, TRIG,  HDL, CHOLHDL, VLDL, LDLCALC, LDLDIRECT   Home Medications   Current Meds  Medication Sig   amLODipine (NORVASC) 10 MG tablet TAKE 1 TABLET BY MOUTH EVERY DAY   aspirin 81 MG chewable tablet Chew 1 tablet (81 mg total) by mouth daily.   D 1000 25 MCG (1000 UT) capsule Take 1,000 Units by mouth daily.   escitalopram (LEXAPRO) 10 MG tablet Take 10 mg by mouth daily.   fexofenadine (ALLEGRA) 180 MG tablet Take 1 tablet (180 mg total) by mouth daily.   ipratropium (ATROVENT) 0.06 % nasal spray 2 sprays into each nostril four (4) times a day.   latanoprost (XALATAN) 0.005 % ophthalmic solution    letrozole (FEMARA) 2.5 MG tablet TAKE 1 TABLET BY MOUTH EVERY DAY   montelukast (SINGULAIR) 10 MG tablet Take 1 tablet (10 mg total) by mouth at bedtime.   nitroGLYCERIN (NITROSTAT) 0.4 MG SL tablet Place 1 tablet (0.4 mg total) under the tongue every 5 (five) minutes as needed for chest pain.   PRESCRIPTION MEDICATION Place 1 drop into both eyes daily. Medication for eye pressure. ( Patient don't know the name)   simvastatin (ZOCOR) 10 MG tablet Take 10 mg by mouth daily.   temazepam (RESTORIL) 15 MG capsule Take 1 capsule (15 mg total) by mouth at bedtime as needed for sleep.   valsartan (DIOVAN) 320 MG tablet TAKE 1 TABLET DAILY. TO REPLACE IRBESARTAN.   vitamin B-12 (CYANOCOBALAMIN) 100 MCG tablet Take 100 mcg by mouth daily.     Review of Systems      All other systems reviewed and are otherwise negative except as noted above.  Physical Exam    VS:  BP (!) 118/48    Pulse 65    Ht 5\' 4"  (1.626 m)    Wt 119 lb (54 kg)    SpO2 99%    BMI 20.43 kg/m  , BMI Body mass index is 20.43 kg/m.  Wt Readings from Last 3 Encounters:  05/12/21 119 lb (54 kg)  07/10/20 122 lb 3.2 oz (55.4 kg)  09/20/19 125 lb 12.5 oz (57.1 kg)     GEN: Well nourished, well developed, in no acute distress. HEENT: normal. Neck: Supple, no JVD, carotid bruits, or masses. Cardiac: RRR, 2/6 soft systolic murmur, rubs,  or gallops. No clubbing,  cyanosis, edema.  Radials/PT 2+ and equal bilaterally.  Respiratory:  Respirations regular and unlabored, clear to auscultation bilaterally. GI: Soft, nontender, nondistended. MS: No deformity or atrophy. Skin: Warm and dry, no rash. Neuro:  Strength and sensation are intact. Psych: Normal affect.  Assessment & Plan    Preop Clearance:  Olivia Gray perioperative risk of a major cardiac event is 0.9% according to the Revised Cardiac Risk Index (RCRI).  Therefore, she is at low risk for perioperative complications.   Her functional capacity is good at 5.07 METs according to the Duke Activity Status Index (DASI). Recommendations: According to ACC/AHA guidelines, no further cardiovascular testing needed.  The patient may proceed to surgery at acceptable risk.   Antiplatelet and/or Anticoagulation Recommendations: Would prefer for the patient not to hold ASA 81mg  but can hold if the surgeon deems necessary.  2. Bradycardia -Asymptomatic and does not seem like any recent episodes  3. CAD s/p CABG -No chest pain or SOB  4. Hypertension -Well controlled today -Continue to take your BP at home -Continue low-sodium diet  5. Hyperlipidemia -Continue statin therapy -continue annual Lipid panels -no recent labs for me to review  6. Prediabetes -per primary care  7. Mitral valve prolapse -Trivial mitral valve regurgitation noted -would recommend f/u echocardiogram which does not need to be completed before her surgery -Asymptomatic at this time  8. Subdural hematoma -Related to trauma when she fell back in late 2020 -Last year she went to the hospital and a CT of the head showed a stable subdural hematoma -Per Dr. Zada Finders, does not need a follow-up head CT if remains symptom free.   Disposition: Follow up 6 months with Glenetta Hew, MD or APP.  Signed, Elgie Collard, PA-C 05/12/2021, 4:40 PM [facility]

## 2021-05-12 ENCOUNTER — Other Ambulatory Visit: Payer: Self-pay

## 2021-05-12 ENCOUNTER — Encounter (HOSPITAL_BASED_OUTPATIENT_CLINIC_OR_DEPARTMENT_OTHER): Payer: Self-pay | Admitting: Physician Assistant

## 2021-05-12 ENCOUNTER — Ambulatory Visit (HOSPITAL_BASED_OUTPATIENT_CLINIC_OR_DEPARTMENT_OTHER): Payer: Medicare Other | Admitting: Physician Assistant

## 2021-05-12 VITALS — BP 118/48 | HR 65 | Ht 64.0 in | Wt 119.0 lb

## 2021-05-12 DIAGNOSIS — R296 Repeated falls: Secondary | ICD-10-CM

## 2021-05-12 DIAGNOSIS — I25810 Atherosclerosis of coronary artery bypass graft(s) without angina pectoris: Secondary | ICD-10-CM

## 2021-05-12 DIAGNOSIS — S065XAA Traumatic subdural hemorrhage with loss of consciousness status unknown, initial encounter: Secondary | ICD-10-CM

## 2021-05-12 DIAGNOSIS — E785 Hyperlipidemia, unspecified: Secondary | ICD-10-CM

## 2021-05-12 DIAGNOSIS — I341 Nonrheumatic mitral (valve) prolapse: Secondary | ICD-10-CM

## 2021-05-12 DIAGNOSIS — R001 Bradycardia, unspecified: Secondary | ICD-10-CM | POA: Diagnosis not present

## 2021-05-12 DIAGNOSIS — R7303 Prediabetes: Secondary | ICD-10-CM

## 2021-05-12 NOTE — Patient Instructions (Addendum)
Medication Instructions:  Your Physician recommend you continue on your current medication as directed.    *If you need a refill on your cardiac medications before your next appointment, please call your pharmacy*   Lab Work: None ordered today    Testing/Procedures: You are cleared for your Procedure. Raeford Razor will send her note to Dr. Ellyn Hack.   Your physician has requested that you have an echocardiogram. Echocardiography is a painless test that uses sound waves to create images of your heart. It provides your doctor with information about the size and shape of your heart and how well your hearts chambers and valves are working. This procedure takes approximately one hour. There are no restrictions for this procedure. Outlook, you and your health needs are our priority.  As part of our continuing mission to provide you with exceptional heart care, we have created designated Provider Care Teams.  These Care Teams include your primary Cardiologist (physician) and Advanced Practice Providers (APPs -  Physician Assistants and Nurse Practitioners) who all work together to provide you with the care you need, when you need it.  We recommend signing up for the patient portal called "MyChart".  Sign up information is provided on this After Visit Summary.  MyChart is used to connect with patients for Virtual Visits (Telemedicine).  Patients are able to view lab/test results, encounter notes, upcoming appointments, etc.  Non-urgent messages can be sent to your provider as well.   To learn more about what you can do with MyChart, go to [email or web address].    Your next appointment:   6 month(s)  The format for your next appointment:   In Person  Provider:   Glenetta Hew, MD or APP

## 2021-05-14 DIAGNOSIS — M25532 Pain in left wrist: Secondary | ICD-10-CM | POA: Diagnosis not present

## 2021-06-02 DIAGNOSIS — I10 Essential (primary) hypertension: Secondary | ICD-10-CM | POA: Diagnosis not present

## 2021-06-06 DIAGNOSIS — E039 Hypothyroidism, unspecified: Secondary | ICD-10-CM | POA: Diagnosis not present

## 2021-06-06 DIAGNOSIS — E785 Hyperlipidemia, unspecified: Secondary | ICD-10-CM | POA: Diagnosis not present

## 2021-06-06 DIAGNOSIS — I129 Hypertensive chronic kidney disease with stage 1 through stage 4 chronic kidney disease, or unspecified chronic kidney disease: Secondary | ICD-10-CM | POA: Diagnosis not present

## 2021-06-06 DIAGNOSIS — J342 Deviated nasal septum: Secondary | ICD-10-CM | POA: Diagnosis not present

## 2021-06-06 DIAGNOSIS — E1122 Type 2 diabetes mellitus with diabetic chronic kidney disease: Secondary | ICD-10-CM | POA: Diagnosis not present

## 2021-06-06 DIAGNOSIS — I2510 Atherosclerotic heart disease of native coronary artery without angina pectoris: Secondary | ICD-10-CM | POA: Diagnosis not present

## 2021-06-06 DIAGNOSIS — J343 Hypertrophy of nasal turbinates: Secondary | ICD-10-CM | POA: Diagnosis not present

## 2021-06-06 DIAGNOSIS — N189 Chronic kidney disease, unspecified: Secondary | ICD-10-CM | POA: Diagnosis not present

## 2021-06-06 DIAGNOSIS — J310 Chronic rhinitis: Secondary | ICD-10-CM | POA: Diagnosis not present

## 2021-06-22 ENCOUNTER — Other Ambulatory Visit: Payer: Self-pay | Admitting: Hematology and Oncology

## 2021-07-08 DIAGNOSIS — N1831 Chronic kidney disease, stage 3a: Secondary | ICD-10-CM | POA: Diagnosis not present

## 2021-07-08 DIAGNOSIS — R7309 Other abnormal glucose: Secondary | ICD-10-CM | POA: Diagnosis not present

## 2021-07-08 DIAGNOSIS — I129 Hypertensive chronic kidney disease with stage 1 through stage 4 chronic kidney disease, or unspecified chronic kidney disease: Secondary | ICD-10-CM | POA: Diagnosis not present

## 2021-07-08 DIAGNOSIS — R634 Abnormal weight loss: Secondary | ICD-10-CM | POA: Diagnosis not present

## 2021-07-15 ENCOUNTER — Inpatient Hospital Stay: Payer: Medicare Other | Attending: Hematology and Oncology | Admitting: Hematology and Oncology

## 2021-07-15 NOTE — Progress Notes (Incomplete)
? ?Patient Care Team: ?Janie Morning, DO as PCP - General (Family Medicine) ?Leonie Man, MD as PCP - Cardiology (Cardiology) ?[name], MD as Consulting Physician (General Surgery) ?Nicholas Lose, MD as Consulting Physician (Hematology and Oncology) ?Gery Pray, MD as Consulting Physician (Radiation Oncology) ?Gardenia Phlegm, NP as Nurse Practitioner (Hematology and Oncology) ?Jani Gravel, MD (Internal Medicine) ? ?DIAGNOSIS:  ?Encounter Diagnosis  ?Name Primary?  ? Malignant neoplasm of upper-outer quadrant of left breast in female, estrogen receptor positive ([place])   ? ? ?SUMMARY OF ONCOLOGIC HISTORY: ?Oncology History  ?Breast cancer of upper-outer quadrant of left female breast ([place])  ?12/26/2015 Initial Diagnosis  ? Left breast biopsy 1:00: Invasive ductal carcinoma grade 1, DCIS,ER 95%, PR 95%, Ki-67 10%, HER-2 negative ratio 1.12; left breast mass 1:00 position 7 mm size axilla ultrasound negative, T1 BN 0 stage IA clinical stage ?  ?01/29/2016 Surgery  ? Left lumpectomy: IDC with DCIS, 1 cm, margins negative, fibrocystic changes,1/2 lymph nodes positive ( intramammary nodes)T1bN1 stage II a ?  ?03/11/2016 - 04/29/2016 Radiation Therapy  ? Adj XRT (kinard): 1)Left breast/axilla / 50.4 Gy in 28 fractions  2) Left breast boost / 10 Gy in 5 fractions.   ?  ?05/07/2016 -  Anti-estrogen oral therapy  ? Letrozole 2.5 mg daily along with Prolia every 6 months ?  ? ? ?CHIEF COMPLIANT: Follow-up of left breast cancer on letrozole therapy ? ?INTERVAL HISTORY: Olivia Gray is a 88 y.o. with above-mentioned history of left breast cancer treated with lumpectomy, radiation, and who is currently on letrozole therapy. She presents to the clinic today for annual follow-up.    ? ? ?ALLERGIES:  has No Known Allergies. ? ?MEDICATIONS:  ?Current Outpatient Medications  ?Medication Sig Dispense Refill  ? amLODipine (NORVASC) 10 MG tablet TAKE 1 TABLET BY MOUTH EVERY DAY 30 tablet 11  ? aspirin 81 MG chewable  tablet Chew 1 tablet (81 mg total) by mouth daily.    ? D 1000 25 MCG (1000 UT) capsule Take 1,000 Units by mouth daily.    ? escitalopram (LEXAPRO) 10 MG tablet Take 10 mg by mouth daily.    ? fexofenadine (ALLEGRA) 180 MG tablet Take 1 tablet (180 mg total) by mouth daily.    ? ipratropium (ATROVENT) 0.06 % nasal spray 2 sprays into each nostril four (4) times a day.    ? latanoprost (XALATAN) 0.005 % ophthalmic solution     ? letrozole (FEMARA) 2.5 MG tablet TAKE 1 TABLET BY MOUTH EVERY DAY 30 tablet 5  ? montelukast (SINGULAIR) 10 MG tablet Take 1 tablet (10 mg total) by mouth at bedtime.    ? nitroGLYCERIN (NITROSTAT) 0.4 MG SL tablet Place 1 tablet (0.4 mg total) under the tongue every 5 (five) minutes as needed for chest pain. 25 tablet 6  ? PRESCRIPTION MEDICATION Place 1 drop into both eyes daily. Medication for eye pressure. ( Patient don't know the name)    ? simvastatin (ZOCOR) 10 MG tablet Take 10 mg by mouth daily.    ? temazepam (RESTORIL) 15 MG capsule Take 1 capsule (15 mg total) by mouth at bedtime as needed for sleep. 30 capsule 0  ? valsartan (DIOVAN) 320 MG tablet TAKE 1 TABLET DAILY. TO REPLACE IRBESARTAN. 30 tablet 11  ? vitamin B-12 (CYANOCOBALAMIN) 100 MCG tablet Take 100 mcg by mouth daily.    ? ?No current facility-administered medications for this visit.  ? ? ?PHYSICAL EXAMINATION: ?ECOG PERFORMANCE STATUS: {CHL ONC ECOG TA:[id number]} ? ?  There were no vitals filed for this visit. ?There were no vitals filed for this visit. ? ?BREAST:*** No palpable masses or nodules in either right or left breasts. No palpable axillary supraclavicular or infraclavicular adenopathy no breast tenderness or nipple discharge. (exam performed in the presence of a chaperone) ? ?LABORATORY DATA:  ?I have reviewed the data as listed ?CMP Latest Ref Rng & Units 08/26/2019 08/25/2019 08/24/2019  ?Glucose 70 - 99 mg/dL 99 105(H) 106(H)  ?BUN 8 - 23 mg/dL _0 ?Creatinine 0.44 - 1.00 mg/dL 0.76 0.85 0.92  ?Sodium  135 - 145 mmol/L 140 139 140  ?Potassium 3.5 - 5.1 mmol/L 3.9 4.1 3.7  ?Chloride 98 - 111 mmol/L 109 110 108  ?CO2 22 - 32 mmol/L _1 ?Calcium 8.9 - 10.3 mg/dL 8.7(L) 8.6(L) 8.6(L)  ?Total Protein 6.5 - 8.1 g/dL - - -  ?Total Bilirubin 0.3 - 1.2 mg/dL - - -  ?Alkaline Phos 38 - 126 U/L - - -  ?AST 15 - 41 U/L - - -  ?ALT 0 - 44 U/L - - -  ? ? ?Lab Results  ?Component Value Date  ? WBC 4.4 08/26/2019  ? HGB 10.4 (L) 08/26/2019  ? HCT 31.9 (L) 08/26/2019  ? MCV 89.9 08/26/2019  ? PLT 160 08/26/2019  ? NEUTROABS 2.2 12/27/2018  ? ? ?ASSESSMENT & PLAN:  ?No problem-specific Assessment & Plan notes found for this encounter. ? ? ? ?No orders of the defined types were placed in this encounter. ? ?The patient has a good understanding of the overall plan. she agrees with it. she will call with any problems that may develop before the next visit here. ?Total time spent: 30 mins including face to face time and time spent for planning, charting and co-ordination of care ? ? Suzzette Righter, CMA ?07/15/21 ? ? ? I Kirtis Challis, Shelanda Duvall am scribing for Dr. Lindi Adie ? ?***  ?

## 2021-07-15 NOTE — Assessment & Plan Note (Deleted)
Left lumpectomy 01/29/2016: IDC with DCIS, 1 cm, margins negative, fibrocystic changes,1/2 lymph nodes positive ( intramammary nodes)T1bN1 stage II a ??? ?Adjuvant radiation therapy completed 04/29/16 ?? ?Treatment Plan: adjuvant antiestrogen therapy with Letrozole 2.5 mg daily started 05/07/16 ?Osteoporosis:?On Prolia started 02/04/2017 along with calcium and vitamin D. ?Patient has not received any further injections of Prolia since then. ?She does not wish to receive any further Prolia. ?? ?Letrozole toxicities: ?Occasional hot flashes but denies any problems ?? ?Breast cancer surveillance: ?1.??Breast exam 07/15/2021: Benign ?2.?mammograms??09/06/2017: Benign breast density category B ?Patient needs a new mammogram. ?? ?Return to clinic in 1 year for follow-up ?? ?

## 2021-07-17 DIAGNOSIS — G4701 Insomnia due to medical condition: Secondary | ICD-10-CM | POA: Diagnosis not present

## 2021-07-17 DIAGNOSIS — K5900 Constipation, unspecified: Secondary | ICD-10-CM | POA: Diagnosis not present

## 2021-07-17 DIAGNOSIS — H3552 Pigmentary retinal dystrophy: Secondary | ICD-10-CM | POA: Diagnosis not present

## 2021-07-17 DIAGNOSIS — Z0000 Encounter for general adult medical examination without abnormal findings: Secondary | ICD-10-CM | POA: Diagnosis not present

## 2021-07-17 DIAGNOSIS — I129 Hypertensive chronic kidney disease with stage 1 through stage 4 chronic kidney disease, or unspecified chronic kidney disease: Secondary | ICD-10-CM | POA: Diagnosis not present

## 2021-07-17 DIAGNOSIS — N1831 Chronic kidney disease, stage 3a: Secondary | ICD-10-CM | POA: Diagnosis not present

## 2021-07-17 DIAGNOSIS — R7309 Other abnormal glucose: Secondary | ICD-10-CM | POA: Diagnosis not present

## 2021-07-17 DIAGNOSIS — Z951 Presence of aortocoronary bypass graft: Secondary | ICD-10-CM | POA: Diagnosis not present

## 2021-07-17 DIAGNOSIS — D709 Neutropenia, unspecified: Secondary | ICD-10-CM | POA: Diagnosis not present

## 2021-07-18 ENCOUNTER — Other Ambulatory Visit: Payer: Self-pay | Admitting: Family Medicine

## 2021-07-18 DIAGNOSIS — R194 Change in bowel habit: Secondary | ICD-10-CM

## 2021-07-18 DIAGNOSIS — R634 Abnormal weight loss: Secondary | ICD-10-CM

## 2021-07-22 ENCOUNTER — Other Ambulatory Visit: Payer: Self-pay

## 2021-07-22 ENCOUNTER — Ambulatory Visit
Admission: RE | Admit: 2021-07-22 | Discharge: 2021-07-22 | Disposition: A | Discharge status: Home / Self Care | Payer: Medicare Other | Source: Ambulatory Visit | Attending: Family Medicine | Admitting: Family Medicine

## 2021-07-22 DIAGNOSIS — K5730 Diverticulosis of large intestine without perforation or abscess without bleeding: Secondary | ICD-10-CM | POA: Diagnosis not present

## 2021-07-22 DIAGNOSIS — R194 Change in bowel habit: Secondary | ICD-10-CM | POA: Diagnosis not present

## 2021-07-22 DIAGNOSIS — R634 Abnormal weight loss: Secondary | ICD-10-CM

## 2021-07-22 DIAGNOSIS — D3501 Benign neoplasm of right adrenal gland: Secondary | ICD-10-CM | POA: Diagnosis not present

## 2021-07-22 MED ORDER — IOPAMIDOL (ISOVUE-300) INJECTION 61%
100.0000 mL | Freq: Once | INTRAVENOUS | Status: AC | PRN
  Administered 2021-07-22: 100 mL via INTRAVENOUS

## 2021-07-26 HISTORY — PX: TRANSTHORACIC ECHOCARDIOGRAM: SHX275

## 2021-08-06 ENCOUNTER — Ambulatory Visit (HOSPITAL_COMMUNITY): Payer: Medicare Other | Attending: Cardiovascular Disease

## 2021-08-06 DIAGNOSIS — I25810 Atherosclerosis of coronary artery bypass graft(s) without angina pectoris: Secondary | ICD-10-CM | POA: Insufficient documentation

## 2021-08-06 LAB — ECHOCARDIOGRAM COMPLETE
Area-P 1/2: 2.82 cm2
P 1/2 time: 394 msec
S' Lateral: 2.7 cm

## 2021-08-07 ENCOUNTER — Telehealth: Payer: Self-pay | Admitting: Cardiology

## 2021-08-07 NOTE — Telephone Encounter (Signed)
[name] needing clarification on pt's test results. Please advise ?

## 2021-08-07 NOTE — Telephone Encounter (Signed)
Olivia Campbell, LPN  ?7/42/5525  8:94 AM EDT   ?  ?Pt aware of echo results ./cy  ? Loel Dubonnet, NP  ?08/07/2021  7:51 AM EDT   ?  ?Echo shows normal heart pumping function. Trivial leaking and mild prolapse of mitral valve. Mild leaking of aortic valve. Stable compared to previous. Repeat echo 1 year for monitoring.   ?Discussed ECHO with husband (DPR), verbalized understanding. He will discuss next steps at f/u appt. F/u appt scheduled in October, he states that they will arrive early.  ? ? ?

## 2021-08-13 ENCOUNTER — Telehealth: Payer: Self-pay | Admitting: Cardiology

## 2021-08-13 NOTE — Telephone Encounter (Signed)
I have not seen [name] in 2 years.  Echocardiogram did not have any findings that that should make her concerned about exercising.  Mild leaky valve is not of concern.  Not likely something that will cause any problems. ? ?In fact the echocardiogram is pretty much read as stable compared to previous echo. ? ?Olivia Hew, MD ?

## 2021-08-13 NOTE — Telephone Encounter (Signed)
Patient wanted to know how intense she could exercise. She was recently informed she had a leaky valve. ? ?She was doing moderate bodyweight exercises and occasionally use some dumbbells.  ? ?Please advise ?

## 2021-08-13 NOTE — Telephone Encounter (Signed)
Routed to MD to advise if any exercise restrictions  ?

## 2021-08-14 NOTE — Telephone Encounter (Signed)
Patient informed of Dr. Allison Quarry note.Marland KitchenMarland KitchenI have not seen Ms. Monrroy in 2 years.  Echocardiogram did not have any findings that that should make her concerned about exercising.  Mild leaky valve is not of concern.  Not likely something that will cause any problems. ?In fact the echocardiogram is pretty much read as stable compared to previous echo. Glenetta Hew, MD." ?Patient put husband on the phone. He wanted Dr. Ellyn Hack to know of patient's history of subdural hematoma from 2020 and 2021, and if patient should be exercising. I recommended that he call the neurosurgeon for exercise clearance. I explained that from a cardiac standpoint, the patient can exercise, but patient should wait to exercise until she gets neuro clearance. ?

## 2021-08-14 NOTE — Telephone Encounter (Signed)
Patient returning call.

## 2021-08-14 NOTE — Telephone Encounter (Signed)
Left message to call back on  ?[phone] [facility]) ?

## 2021-08-14 NOTE — Telephone Encounter (Signed)
Indeed - I cannot speak for the NeuroSgx.  But Heart Is OK. ? ?DH ?

## 2021-08-18 DIAGNOSIS — J300 Vasomotor rhinitis: Secondary | ICD-10-CM | POA: Diagnosis not present

## 2021-09-02 ENCOUNTER — Telehealth: Payer: Self-pay | Admitting: Hematology and Oncology

## 2021-09-02 NOTE — Telephone Encounter (Signed)
.  Called patient to schedule appointment per 5/8 inbasket, patient is aware of date and time.   ?

## 2021-09-08 DIAGNOSIS — K5909 Other constipation: Secondary | ICD-10-CM | POA: Diagnosis not present

## 2021-09-08 DIAGNOSIS — H6120 Impacted cerumen, unspecified ear: Secondary | ICD-10-CM | POA: Diagnosis not present

## 2021-09-11 NOTE — Progress Notes (Signed)
Patient Care Team: Janie Morning, DO as PCP - General (Family Medicine) Leonie Man, MD as PCP - Cardiology (Cardiology) [name], MD as Consulting Physician (General Surgery) Nicholas Lose, MD as Consulting Physician (Hematology and Oncology) Gery Pray, MD as Consulting Physician (Radiation Oncology) Delice Bison Charlestine Massed, NP as Nurse Practitioner (Hematology and Oncology) Jani Gravel, MD (Internal Medicine)  DIAGNOSIS:  Encounter Diagnosis  Name Primary?   Malignant neoplasm of upper-outer quadrant of left breast in female, estrogen receptor positive ([place])     SUMMARY OF ONCOLOGIC HISTORY: Oncology History  Breast cancer of upper-outer quadrant of left female breast ([place])  12/26/2015 Initial Diagnosis   Left breast biopsy 1:00: Invasive ductal carcinoma grade 1, DCIS,ER 95%, PR 95%, Ki-67 10%, HER-2 negative ratio 1.12; left breast mass 1:00 position 7 mm size axilla ultrasound negative, T1 BN 0 stage IA clinical stage    01/29/2016 Surgery   Left lumpectomy: IDC with DCIS, 1 cm, margins negative, fibrocystic changes,1/2 lymph nodes positive ( intramammary nodes)T1bN1 stage II a    03/11/2016 - 04/29/2016 Radiation Therapy   Adj XRT (kinard): 1)Left breast/axilla / 50.4 Gy in 28 fractions  2) Left breast boost / 10 Gy in 5 fractions.      05/07/2016 -  Anti-estrogen oral therapy   Letrozole 2.5 mg daily along with Prolia every 6 months      CHIEF COMPLIANT: Follow-up of left breast cancer on letrozole therapy    INTERVAL HISTORY: Olivia Gray is a 88 y.o. with above-mentioned history of left breast cancer treated with lumpectomy, radiation, and who is currently on letrozole therapy. She presents to the clinic today for follow-up.  She has been on letrozole therapy for over 5 years and appears to be tolerating it fairly well.  She denies any lumps or nodules in the breast.  She is fairly paranoid thinking that she has recurrence of breast  cancer.  ALLERGIES:  has No Known Allergies.  MEDICATIONS:  Current Outpatient Medications  Medication Sig Dispense Refill   amLODipine (NORVASC) 10 MG tablet TAKE 1 TABLET BY MOUTH EVERY DAY 30 tablet 11   aspirin 81 MG chewable tablet Chew 1 tablet (81 mg total) by mouth daily.     D 1000 25 MCG (1000 UT) capsule Take 1,000 Units by mouth daily.     escitalopram (LEXAPRO) 10 MG tablet Take 10 mg by mouth daily.     fexofenadine (ALLEGRA) 180 MG tablet Take 1 tablet (180 mg total) by mouth daily.     ipratropium (ATROVENT) 0.06 % nasal spray 2 sprays into each nostril four (4) times a day.     latanoprost (XALATAN) 0.005 % ophthalmic solution      montelukast (SINGULAIR) 10 MG tablet Take 1 tablet (10 mg total) by mouth at bedtime.     nitroGLYCERIN (NITROSTAT) 0.4 MG SL tablet Place 1 tablet (0.4 mg total) under the tongue every 5 (five) minutes as needed for chest pain. 25 tablet 6   PRESCRIPTION MEDICATION Place 1 drop into both eyes daily. Medication for eye pressure. ( Patient don't know the name)     simvastatin (ZOCOR) 10 MG tablet Take 10 mg by mouth daily.     temazepam (RESTORIL) 15 MG capsule Take 1 capsule (15 mg total) by mouth at bedtime as needed for sleep. 30 capsule 0   valsartan (DIOVAN) 320 MG tablet TAKE 1 TABLET DAILY. TO REPLACE IRBESARTAN. 30 tablet 11   vitamin B-12 (CYANOCOBALAMIN) 100 MCG tablet Take 100 mcg by  mouth daily.     No current facility-administered medications for this visit.    PHYSICAL EXAMINATION: ECOG PERFORMANCE STATUS: 3 - Symptomatic, >50% confined to bed  Vitals:   09/18/21 1134  BP: 134/60  Pulse: 72  Resp: 18  Temp: (!) 97.3 F (36.3 C)  SpO2: 100%   Filed Weights   09/18/21 1134  Weight: 113 lb 3.2 oz (51.3 kg)    BREAST: Nice to meet you n palpable masses or nodules in either right or left breasts. No palpable axillary supraclavicular or infraclavicular adenopathy no breast tenderness or nipple discharge. (exam performed in  the presence of a chaperone)  LABORATORY DATA:  I have reviewed the data as listed    Latest Ref Rng & Units 08/26/2019    3:04 AM 08/25/2019    6:54 AM 08/24/2019    9:08 AM  CMP  Glucose 70 - 99 mg/dL 99   105   106    BUN 8 - 23 mg/dL '17   14   15    ' Creatinine 0.44 - 1.00 mg/dL 0.76   0.85   0.92    Sodium 135 - 145 mmol/L 140   139   140    Potassium 3.5 - 5.1 mmol/L 3.9   4.1   3.7    Chloride 98 - 111 mmol/L 109   110   108    CO2 22 - 32 mmol/L '22   23   23    ' Calcium 8.9 - 10.3 mg/dL 8.7   8.6   8.6      Lab Results  Component Value Date   WBC 4.4 08/26/2019   HGB 10.4 (L) 08/26/2019   HCT 31.9 (L) 08/26/2019   MCV 89.9 08/26/2019   PLT 160 08/26/2019   NEUTROABS 2.2 12/27/2018    ASSESSMENT & PLAN:  Breast cancer of upper-outer quadrant of left female breast ([place]) Left lumpectomy 01/29/2016: IDC with DCIS, 1 cm, margins negative, fibrocystic changes,1/2 lymph nodes positive ( intramammary nodes)T1bN1 stage II a    Adjuvant radiation therapy completed 04/29/16   Treatment Plan: adjuvant antiestrogen therapy with Letrozole 2.5 mg daily started 05/07/16 Osteoporosis: Because of her history of osteoporosis, I recommended that she stop letrozole at this time having completed 5 and half years of therapy.  Letrozole toxicities: Occasional hot flashes but denies any problems   Breast cancer surveillance: 1.  Breast exam 09/18/2021: Benign 2. mammograms  09/06/2017: Benign breast density category B, patient has not had any mammograms since then     Return to clinic in 1 year for follow-up      No orders of the defined types were placed in this encounter.  The patient has a good understanding of the overall plan. she agrees with it. she will call with any problems that may develop before the next visit here. Total time spent: 30 mins including face to face time and time spent for planning, charting and co-ordination of care   Harriette Ohara, MD 09/18/21    I  Gardiner Coins am scribing for Dr. Lindi Adie  I have reviewed the above documentation for accuracy and completeness, and I agree with the above.

## 2021-09-18 ENCOUNTER — Inpatient Hospital Stay: Payer: Medicare Other | Attending: Hematology and Oncology | Admitting: Hematology and Oncology

## 2021-09-18 ENCOUNTER — Other Ambulatory Visit: Payer: Self-pay

## 2021-09-18 DIAGNOSIS — M810 Age-related osteoporosis without current pathological fracture: Secondary | ICD-10-CM | POA: Diagnosis not present

## 2021-09-18 DIAGNOSIS — Z79899 Other long term (current) drug therapy: Secondary | ICD-10-CM | POA: Diagnosis not present

## 2021-09-18 DIAGNOSIS — Z79811 Long term (current) use of aromatase inhibitors: Secondary | ICD-10-CM | POA: Diagnosis not present

## 2021-09-18 DIAGNOSIS — Z170 Estrogen receptor positive status [ER+]: Secondary | ICD-10-CM | POA: Insufficient documentation

## 2021-09-18 DIAGNOSIS — C50412 Malignant neoplasm of upper-outer quadrant of left female breast: Secondary | ICD-10-CM | POA: Insufficient documentation

## 2021-09-18 NOTE — Assessment & Plan Note (Addendum)
Left lumpectomy 01/29/2016: IDC with DCIS, 1 cm, margins negative, fibrocystic changes,1/2 lymph nodes positive ( intramammary nodes)T1bN1 stage II a  Adjuvant radiation therapy completed 04/29/16  Treatment Plan: adjuvant antiestrogen therapy with Letrozole 2.5 mg daily started 05/07/16 Osteoporosis:On Prolia started 02/04/2017 along with calcium and vitamin D. Patient has not received any further injections of Prolia since then. She does not wish to receive any further Prolia.  Letrozole toxicities: Occasional hot flashes but denies any problems  Breast cancer surveillance: 1.Breast exam 09/18/2021: Benign 2.mammograms5/13/2019: Benign breast density category B, patient has not had any mammograms since then    Return to clinic on an as-needed basis

## 2021-10-02 ENCOUNTER — Telehealth: Payer: Self-pay | Admitting: Cardiology

## 2021-10-02 NOTE — Telephone Encounter (Signed)
Spoke with pt husband, he called to report that the patient has been taking amlodipine 5 mg since her discharge 08/26/19 and at the last office visit he was told she has a leaking heart valve and he is concerned that her only taking 1/2 of the 10 mg amlodipine has caused that problem. Reassurance given to the husband that the leaking valve was not caused by the medication. He is upset because he thinks that her doctor should know what medications she takes and should know the medication was changed at discharge. Advised patient to make sure to bring an updated medication list when he comes to the appointment to make sure our records are correct. They are unable to check the patients blood pressure at home. He does not know of any problems she is having. Aware will forward to dr harding to review,

## 2021-10-02 NOTE — Telephone Encounter (Signed)
Pt c/o medication issue:  1. Name of Medication:   amLODipine (NORVASC) 10 MG tablet    2. How are you currently taking this medication (dosage and times per day)? TAKE 1 TABLET BY MOUTH EVERY DAY  3. Are you having a reaction (difficulty breathing--STAT)? No  4. What is your medication issue?  [name] states that he needs clarification on mediation. Due to be told different when pt had a visit to the hospital. Please advise

## 2021-10-03 NOTE — Telephone Encounter (Signed)
I have not seen this patient in almost 2 years.  I saw her for virtual visit in July 2021 for hospital follow-up (from the 08/2019 d/c). I was aware that the medications are dropped as it was indicated in my note.   The reason why her medications because her blood pressures were low in the hospital.  Someone may have been back to 10 mg in the interim since I have seen her..  As for the finding on the echocardiogram.  The mild aortic regurgitation is simply wear-and-tear of the aortic valve moderate sclerosis.  This is no change since 2021.  I 100% agree with Olivia Gray's recommendation to bring medications with him to visits.  This is the only way we know for certain what medications are being taken.  Per my NOTE:   Inerval History    "Olivia Gray is doing fairly well today recovering from her recent fall & injuries.  Doing better.  She indicated that she "missed a step" when she fell.  She had gone downstairs to the fridge & "lost her balance".  Denied lightheadedness or dizziness. She really does not know how she fell or where she fell.  She does not remember anything after getting to the fridge. Husband found her in the bathroom downstairs - & noted that she was bleeding.  She was seated on the commode - but has not memory of how she got to the bathroom.    Noted that Coreg was stopped & Amlodipine dose was dropped (-taking 1/2 tablet of 10 mg = 5 mg).  -- BP levels have been "doing good" since d/c. No recurrent lightheaded or dizzy spells. Denies any Sx of rapid or irregular heartbeats.  No SSx of Syncope/near-syncope or TIA/amaurosis fugax."   Glenetta Hew, MD

## 2021-10-06 MED ORDER — AMLODIPINE BESYLATE 5 MG PO TABS: 5.0000 mg | ORAL_TABLET | Freq: Every day | ORAL | 3 refills | Status: DC

## 2021-10-06 MED ORDER — AMLODIPINE BESYLATE 10 MG PO TABS: 5.0000 mg | ORAL_TABLET | Freq: Every day | ORAL | 11 refills | Status: DC

## 2021-10-06 NOTE — Telephone Encounter (Signed)
Patient aware of dr harding's recommendations.

## 2021-10-17 DIAGNOSIS — J310 Chronic rhinitis: Secondary | ICD-10-CM | POA: Diagnosis not present

## 2021-10-17 DIAGNOSIS — H6123 Impacted cerumen, bilateral: Secondary | ICD-10-CM | POA: Diagnosis not present

## 2021-11-12 ENCOUNTER — Other Ambulatory Visit: Payer: Self-pay | Admitting: Cardiology

## 2021-12-05 DIAGNOSIS — C50912 Malignant neoplasm of unspecified site of left female breast: Secondary | ICD-10-CM | POA: Diagnosis not present

## 2022-01-05 ENCOUNTER — Other Ambulatory Visit: Payer: Self-pay | Admitting: Hematology and Oncology

## 2022-01-05 NOTE — Telephone Encounter (Signed)
Per last OV, continue femara. Gardiner Rhyme, RN

## 2022-01-08 DIAGNOSIS — H93299 Other abnormal auditory perceptions, unspecified ear: Secondary | ICD-10-CM | POA: Diagnosis not present

## 2022-01-08 DIAGNOSIS — J310 Chronic rhinitis: Secondary | ICD-10-CM | POA: Diagnosis not present

## 2022-01-19 ENCOUNTER — Other Ambulatory Visit: Payer: Self-pay | Admitting: Cardiology

## 2022-01-19 DIAGNOSIS — R1312 Dysphagia, oropharyngeal phase: Secondary | ICD-10-CM | POA: Diagnosis not present

## 2022-01-19 DIAGNOSIS — K219 Gastro-esophageal reflux disease without esophagitis: Secondary | ICD-10-CM | POA: Diagnosis not present

## 2022-01-19 DIAGNOSIS — H903 Sensorineural hearing loss, bilateral: Secondary | ICD-10-CM | POA: Diagnosis not present

## 2022-01-19 DIAGNOSIS — R0982 Postnasal drip: Secondary | ICD-10-CM | POA: Diagnosis not present

## 2022-01-23 ENCOUNTER — Other Ambulatory Visit (HOSPITAL_COMMUNITY): Payer: Self-pay | Admitting: *Deleted

## 2022-01-23 DIAGNOSIS — R1310 Dysphagia, unspecified: Secondary | ICD-10-CM

## 2022-01-28 ENCOUNTER — Ambulatory Visit (HOSPITAL_COMMUNITY)
Admission: RE | Admit: 2022-01-28 | Discharge: 2022-01-28 | Disposition: A | Discharge status: Home / Self Care | Payer: Medicare Other | Source: Ambulatory Visit

## 2022-01-28 ENCOUNTER — Ambulatory Visit (HOSPITAL_COMMUNITY)
Admission: RE | Admit: 2022-01-28 | Discharge: 2022-01-28 | Disposition: A | Discharge status: Home / Self Care | Payer: Medicare Other | Source: Ambulatory Visit | Attending: Family Medicine | Admitting: Family Medicine

## 2022-01-28 DIAGNOSIS — J392 Other diseases of pharynx: Secondary | ICD-10-CM | POA: Diagnosis not present

## 2022-01-28 DIAGNOSIS — R1310 Dysphagia, unspecified: Secondary | ICD-10-CM

## 2022-01-28 DIAGNOSIS — Z8679 Personal history of other diseases of the circulatory system: Secondary | ICD-10-CM | POA: Diagnosis not present

## 2022-01-29 ENCOUNTER — Other Ambulatory Visit: Payer: Self-pay | Admitting: Cardiology

## 2022-01-30 ENCOUNTER — Ambulatory Visit (HOSPITAL_COMMUNITY): Payer: Medicare Other

## 2022-01-30 ENCOUNTER — Encounter (HOSPITAL_COMMUNITY): Payer: Self-pay

## 2022-01-30 ENCOUNTER — Encounter (HOSPITAL_COMMUNITY): Payer: Medicare Other

## 2022-02-02 ENCOUNTER — Ambulatory Visit: Payer: Medicare Other | Attending: Cardiology | Admitting: Cardiology

## 2022-02-02 ENCOUNTER — Encounter: Payer: Self-pay | Admitting: Cardiology

## 2022-02-02 VITALS — BP 148/74 | HR 64 | Ht 64.0 in | Wt 121.4 lb

## 2022-02-02 DIAGNOSIS — I10 Essential (primary) hypertension: Secondary | ICD-10-CM | POA: Diagnosis not present

## 2022-02-02 DIAGNOSIS — I2510 Atherosclerotic heart disease of native coronary artery without angina pectoris: Secondary | ICD-10-CM | POA: Diagnosis not present

## 2022-02-02 DIAGNOSIS — E785 Hyperlipidemia, unspecified: Secondary | ICD-10-CM | POA: Diagnosis not present

## 2022-02-02 DIAGNOSIS — I341 Nonrheumatic mitral (valve) prolapse: Secondary | ICD-10-CM

## 2022-02-02 DIAGNOSIS — Z01810 Encounter for preprocedural cardiovascular examination: Secondary | ICD-10-CM | POA: Diagnosis not present

## 2022-02-02 DIAGNOSIS — Z951 Presence of aortocoronary bypass graft: Secondary | ICD-10-CM | POA: Diagnosis not present

## 2022-02-02 NOTE — Progress Notes (Signed)
Primary Care Provider: Janie Morning, [place] Cardiologist: Glenetta Hew, MD Electrophysiologist: None  Clinic Note: Chief Complaint  Patient presents with   Follow-up    Delayed.  9 months Preop   Coronary Artery Disease    No angina   ===================================  ASSESSMENT/PLAN   Problem List Items Addressed This Visit       Cardiology Problems   CAD, multiple vessel - Primary (Chronic)    Stable from a cardiac standpoint.  No active angina or heart failure symptoms.  Stable echo with no wall motion abnormalities.  She is on 81 mg aspirin along with statin although this theoretically could be increased to 20 mg simvastatin versus 20 mg rosuvastatin for better control. Not on beta-blocker because of bradycardia On ARB for afterload reduction and calcium channel blocker BP/NT angina In the absence of symptoms, no need for ischemic evaluation.      Mitral valve prolapse    Pretty much stable mild MVP on echo.  Would not recheck a follow-up for another couple years unless symptoms warrant.  No need for SBE prophylaxis.      Essential hypertension (Chronic)    BP stable on current dose of losartan and amlodipine.  Again, looking for mild permissive hypertension.  Would tolerate blood pressures into the 160 mm range as long as she has no symptoms.  Would not want to titrate medications further.  Avoiding diuretic to avoid orthostasis.  No change for now.  No beta-blocker because of bradycardia issues.      Hyperlipidemia LDL goal <70 (Chronic)    Labs were checked last year.  I do not see recent labs.  LDL at the time was 82.  She is on 10 mg simvastatin but has not tolerated other statins.  Based on her cardiac history I would like to see her lipids better controlled but I do not have recent labs.  I had previously recommended switching from simvastatin 10 mg to at least rosuvastatin 10 or 20 mg.  That would potentially get [place] to goal.         Other   Preoperative cardiovascular examination    Bite metabolic illness, she certainly would not meet criteria to adequately assess symptoms, but planned surgery is relatively low risk.  She has normal EF, has not had a stroke, normal renal function, nondiabetic.  As such, despite her other comorbidities from a cardiovascular standpoint would be relatively low risk for any surgeries or procedures unless high risk surgeries.  Would recommend proceeding to the OR without any further cardiovascular evaluation. Okay to hold aspirin 5 to 7 days preop for surgeries or procedures.  Please restart when safe postop.      S/P CABG x 4 (Chronic)    No active angina symptoms.  She is not very active and therefore probably is challenging herself very much.  Last ischemic evaluation was in 2016.  Nonischemic Myoview at that time. Given her advanced age and comorbidities, would not recommend further ischemic evaluation unless she has significant symptoms. EF remains stable/normal with no RWMA.  This would suggest graft patency.      ===================================  HPI:    Olivia Gray is a 88 y.o. female with a PMH below who presents today for delayed 17-monthfollow-up & potential Pre-op Evaluation.  2004: CAD s/p post CABG x4 in (SVG to LAD, SVG to D1, SVG to OM1, SVG to R PDA),  Myoview in 2016-nonischemic MVP - trivial by Echo Echo 08/06/2021: EF 55  to 60%.  No RWMA. Gr1 DD-mild LAE.  Myxomatous MV- Mild late bileaflet MVP with trivial MR, SoV Sclerosis w/o Stenosis, mild AI.  Normal RVP/PAP and RAP H/o Br Ca s/o Chemo.XRT HTN, HDL, prediabetes,  Retinitis pigmentosa of both eyes-Legally Blind Subdural Hematoma - 2/2 fall in December 2020 (stable by Head CT in 2022. Husband found her lying on the floor with clonic movement and right-sided gaze => ? Sz Disorder Head CT showed bilateral subdural hematomas => NSgx OR for bilateral Burr Holes/bilateral ventricular catheters subdural space  for SDH drainage  Bradycardia - noted during 07/2019 admission for fall with multiple injuries. SR with PACs - Bigeminy - rates in 40s => Carvedilol d/c'd & Amlodipine decreased to 5 mg S/p Nasal Sgx -> 20/01/2022 => significant postnasal drip drainage with rhinorrhea and dysphagia.  Cricopharyngeus muscle dysfunction.  (Laryngeal pharyngeal reflux)  Olivia Gray was last seen by Otelia Santee, PA on May 12, 2021 for preop evaluation for posterior nasal nerve ablation bilaterally.  Denies any palpitations.  No dyspnea except because of nasal congestion.  Only chest pain with coughing.  Mild leg tingling for neuropathy, but no real edema.  No claudication.  BP usually controlled at home in the SBP 110-130 range.  No additional cardiovascular evaluation required.  2D echo ordered to just to reassess murmur and MVP.  Heart rate seems stable..  Recent Hospitalizations:  None recently  Reviewed  CV studies:    The following studies were reviewed today: (if available, images/films reviewed: From Epic Chart or Care Everywhere) TTE 07/2019:  EF 55 to 60%, no RWMA ? DD w/ Mod LAE. Mild MVP with Trivial MR. AoV Sclerosis, No Stenosis. Normal RVP/RAP.  ZIO Patch 09/19/2019: Predominantly sinus rhythm: HR range 45-125 bpm, avg 70 bpm.  10 atrial runs: Longest and fastest 7 beats, max HR 171 bpm.  Frequent isolated PACs (12.2%) and occasional isolated PVCs (1.5%).  Rare couplets and triplets of both.  Ventricular trigeminy noted lasting 6.5 sec..  TTE 08/06/21 - reviewed above.  Interval History:   Olivia Gray presents here today for delayed follow-up and extensively very delayed follow-up seeing me.  I have not seen her in years.  She seems to be doing fine from a cardiac standpoint.  She does seem to have aged quite a bit since I last saw her and is quite ill-appearing.  She is now completely blind and also hard of hearing.  Thankfully, from a cardiac standpoint she is pretty stable.  Although she has ectopy  on exam and PACs on EKG, she has no symptoms.  Heart rate seems to control.  Has not had any rapid irregular heartbeats since stopping beta-blocker. Her blood pressure is borderline elevated today but she denies any headache or dizziness.  Has had normal pressures on similar medication regimen of amlodipine 5 mg and valsartan 320 mg.  Avoiding aggressive treatment to avoid orthostasis.  Not on diuretic for the same reason. She has not had any PND, orthopnea or edema.  No chest pain or pressure at rest or exertion.  No tachycardia spells.  No syncope/near syncope or TIA/amaurosis fugax.  No claudication.  She is not very active, mostly because she is blind and has not gotten great attempted walking with a walking stick.  Partly because she is also somewhat unsteady gait and would probably be better off of the walker.  As such, she is pretty sedentary and therefore does not walk enough to really noticed any claudication.  REVIEWED OF SYSTEMS  Review of Systems  Constitutional:  Positive for malaise/fatigue (Sedentary, deconditioned).  HENT:  Positive for congestion (With rhinorrhea), sinus pain (Off-and-on, not currently) and sore throat (Sometimes when the drainage is bad.). Negative for nosebleeds.   Respiratory:  Positive for cough. Negative for sputum production, shortness of breath, wheezing and stridor.   Cardiovascular:        Per HPI  Gastrointestinal:  Negative for blood in stool, constipation, diarrhea and melena.  Genitourinary:  Negative for dysuria and hematuria.  Musculoskeletal:  Positive for falls (Not in several months.) and joint pain (Knees).  Neurological:  Positive for dizziness, weakness (Generalized) and headaches. Negative for focal weakness.  Psychiatric/Behavioral:  Positive for memory loss. Negative for depression. The patient is not nervous/anxious (She is little more anxious than she had been.) and does not have insomnia.     I have reviewed and (if needed) personally  updated the patient's problem list, medications, allergies, past medical and surgical history, social and family history.   PAST MEDICAL HISTORY   Past Medical History:  Diagnosis Date   Bilateral subdural hematomas ([place]) 03/2019   s/p bilateral bur hole and ventricular drains placed in the subdural space. ->  Stable on head CT.   Breast cancer of upper-outer quadrant of left female breast ([place]) 12/27/2015   CAD, multiple vessel 2004   Myoview 2011: Mild Basal-mid Inferolateral "ischemia", EF ~76?% -- Anormal, but LOW RISK   Cancer ([place])    Essential hypertension    History of radiation therapy 03/11/16-04/29/16   left breast/axilla 50.4 Gy in 28 fractions, boost of 10 Gy in 5 fractions   Hyperlipidemia LDL goal <70    On statin.   Legally blind    Mitral valve prolapse 2015   Echo April 2023: Myxomatous MV- Mild late bileaflet MVP with trivial MR   Personal history of radiation therapy 2017   Left Breast Cancer   Pre-diabetes    Retinitis pigmentosa of both eyes     now essentially legally blind.  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 (Native LIMA was 100% occluded)    PAST SURGICAL HISTORY   Past Surgical History:  Procedure Laterality Date   BRAIN SURGERY     BREAST LUMPECTOMY Left 01/29/2016   BREAST LUMPECTOMY WITH RADIOACTIVE SEED LOCALIZATION Left 01/29/2016   Procedure: LEFT BREAST LUMPECTOMY WITH RADIOACTIVE SEED LOCALIZATION;  Surgeon: [name], MD;  Location: [facility];  Service: General;  Laterality: Left;   BREAST SURGERY Bilateral    cyst removal   CARDIAC CATHETERIZATION  05/22/2009   all grafts patent ,LV systolic fx normal,small -vessel diabetic disease in native vessels    CARDIAC SURGERY     CHOLECYSTECTOMY  02/05/2005   CORONARY ARTERY BYPASS GRAFT  07/21/2002    in [place] ; SVG-LAD, SVG-D1, SVG-OM1, SVG RPDA.   CRANIOTOMY Bilateral 04/09/2019   Procedure: BILATERAL BURR HOLES FOR EVACUATION SUBDURAL HEMATOMA;  Surgeon: Judith Part, MD;  Location: [facility];  Service: Neurosurgery;  Laterality: Bilateral;   NM MYOCAR PERF WALL MOTION  04/2009   EF 76%; mild ischeia basal inferolateral,mid inferoinlateral regions(s), abn pharmacologic nuc test, deffect indicte new ischemia,LV syst.fx normal --> cath showing no obstructive targets.   NM MYOVIEW LTD  09/2014   Normal perfusion. Normal study. LOW RISK. Normal EF (55%).  Compared to prior study, no ischemic changes present.   TRANSTHORACIC ECHOCARDIOGRAM  07/2019    EF 50 and 55%.  No R WMA.  Moderate LAE.  Mild to moderate aortic sclerosis but no stenosis.   TRANSTHORACIC ECHOCARDIOGRAM  07/2021   EF 55 to 60%.  No RWMA. Gr1 DD-mild LAE.  Myxomatous MV- Mild late bileaflet MVP with trivial MR, SoV Sclerosis w/o Stenosis, mild AI.  Normal RVP/PAP and RAP    Immunization History  Administered Date(s) Administered   PFIZER Comirnaty(Gray Top)Covid-19 Tri-Sucrose Vaccine 06/04/2020   PFIZER(Purple Top)SARS-COV-2 Vaccination 10/26/2019, 11/23/2019   Pneumococcal-Unspecified 04/27/2013    MEDICATIONS/ALLERGIES   Current Meds  Medication Sig   amLODipine (NORVASC) 5 MG tablet Take 1 tablet (5 mg total) by mouth daily.   aspirin 81 MG chewable tablet Chew 1 tablet (81 mg total) by mouth daily.   D 1000 25 MCG (1000 UT) capsule Take 1,000 Units by mouth daily.   escitalopram (LEXAPRO) 10 MG tablet Take 10 mg by mouth daily.   fexofenadine (ALLEGRA) 180 MG tablet Take 1 tablet (180 mg total) by mouth daily.   ipratropium (ATROVENT) 0.06 % nasal spray 2 sprays into each nostril four (4) times a day.   latanoprost (XALATAN) 0.005 % ophthalmic solution    montelukast (SINGULAIR) 10 MG tablet Take 1 tablet (10 mg total) by mouth at bedtime.   nitroGLYCERIN (NITROSTAT) 0.4 MG SL tablet Place 1 tablet  (0.4 mg total) under the tongue every 5 (five) minutes as needed for chest pain.   PRESCRIPTION MEDICATION Place 1 drop into both eyes daily. Medication for eye pressure. ( Patient don't know the name)   simvastatin (ZOCOR) 10 MG tablet Take 10 mg by mouth daily.   valsartan (DIOVAN) 320 MG tablet TAKE 1 TABLET BY MOUTH DAILY TO REPLACE IRBESARTAN   vitamin B-12 (CYANOCOBALAMIN) 100 MCG tablet Take 100 mcg by mouth daily.    No Known Allergies  SOCIAL HISTORY/FAMILY HISTORY   Reviewed in Epic:  Pertinent findings:  Social History   Tobacco Use   Smoking status: Never   Smokeless tobacco: Never  Substance Use Topics   Alcohol use: Not Currently   Drug use: Not Currently   Social History   Social History Narrative   ** Merged History Encounter **       She is married with 2 children. Does not smoke or drink alcohol. She does routine exercise roughly 3-4 days a week where she follows a   The video. Weights stomach exercises jumping jacks and other aerobic type exercises.    OBJCTIVE -PE, EKG, labs   Wt Readings from Last 3 Encounters:  02/02/22 121 lb 6.4 oz (55.1 kg)  09/18/21 113 lb 3.2 oz (51.3 kg)  05/12/21 119 lb (54 kg)    Physical Exam: BP (!) 148/74   Pulse 64   Ht '5\' 4"'$  (1.626 m)   Wt 121 lb 6.4 oz (55.1 kg)   SpO2 99%   BMI 20.84 kg/m  Physical Exam Vitals reviewed.  Constitutional:      General: She is not in acute distress.    Appearance: She is normal weight. She is not toxic-appearing.     Comments: Mildly ill-appearing.  Legally blind and hard of hearing.  Well-groomed.  HENT:     Head: Normocephalic and atraumatic.  Eyes:     Comments: Blind with EOM suggesting disconjugate gaze  Cardiovascular:     Rate and Rhythm: Normal rate and regular rhythm. Occasional Extrasystoles are present.  Chest Wall: PMI is not displaced.     Pulses: Intact distal pulses.     Heart sounds: S1 normal and S2 normal. Heart sounds are distant. No midsystolic click.  Murmur (1-2/6 SEM at RUSB.  No HSM.) heard.     No gallop.  Pulmonary:     Effort: Pulmonary effort is normal. No respiratory distress.     Breath sounds: Normal breath sounds. No wheezing, rhonchi or rales.  Chest:     Chest wall: No tenderness.  Abdominal:     General: Abdomen is flat. Bowel sounds are normal. There is no distension.     Palpations: Abdomen is soft.     Tenderness: There is no abdominal tenderness.  Musculoskeletal:        General: Swelling (Trivial ankle) present.  Skin:    General: Skin is warm and dry.     Findings: No bruising.  Neurological:     Mental Status: She is alert and oriented to person, place, and time.     Motor: Weakness (Generalized) present.     Gait: Gait abnormal.     Comments: Blind in both eyes for macular degeneration.  Sees light.  Also hard of hearing.  Psychiatric:        Mood and Affect: Mood normal.        Judgment: Judgment normal.     Comments: Very poor understanding.  Asked a lot of questions over and over.     Adult ECG Report Not checked  Recent Labs:   01/14/2021: TC 169, TG 48, HDL 97, LDL 82. 06/28/2021: A1c 6.0. No results found for: "CHOL", "HDL", "LDLCALC", "LDLDIRECT", "TRIG", "CHOLHDL" Lab Results  Component Value Date   CREATININE 0.76 08/26/2019   BUN 17 08/26/2019   NA 140 08/26/2019   K 3.9 08/26/2019   CL 109 08/26/2019   CO2 22 08/26/2019      Latest Ref Rng & Units 08/26/2019    3:04 AM 08/25/2019    6:54 AM 08/24/2019    9:08 AM  CBC  WBC 4.0 - 10.5 K/uL 4.4  3.6  4.1   Hemoglobin 12.0 - 15.0 g/dL 10.4  10.8  10.6   Hematocrit 36.0 - 46.0 % 31.9  33.1  32.4   Platelets 150 - 400 K/uL 160  150  149     No results found for: "HGBA1C" Lab Results  Component Value Date   TSH 0.697 04/08/2019    ================================================== I spent a total of 43 minutes with the patient spent in direct patient consultation.  Additional time spent with chart review  / charting (studies,  outside notes, etc): 38 min Total Time: 81 min  Current medicines are reviewed at length with the patient today.  (+/- concerns) multiple different medications reviewed.  Notice: This dictation was prepared with Dragon dictation along with smart phrase technology. Any transcriptional errors that result from this process are unintentional and may not be corrected upon review.  Studies Ordered:   No orders of the defined types were placed in this encounter.  No orders of the defined types were placed in this encounter.   Patient Instructions / Medication Changes & Studies & Tests Ordered   Patient Instructions  Medication Instructions:    No changes  *If you need a refill on your cardiac medications before your next appointment, please call your pharmacy*   Lab Work:  Not needed  If you have labs (blood work) drawn today and your tests are completely normal, you will  receive your results only by: [place] (if you have MyChart) OR A paper copy in the mail If you have any lab test that is abnormal or we need to change your treatment, we will call you to review the results.   Testing/Procedures:  Not needed  Follow-Up: At [facility], you and your health needs are our priority.  As part of our continuing mission to provide you with exceptional heart care, we have created designated Provider Care Teams.  These Care Teams include your primary Cardiologist (physician) and Advanced Practice Providers (APPs -  Physician Assistants and Nurse Practitioners) who all work together to provide you with the care you need, when you need it.  We recommend signing up for the patient portal called "MyChart".  Sign up information is provided on this After Visit Summary.  MyChart is used to connect with patients for Virtual Visits (Telemedicine).  Patients are able to view lab/test results, encounter notes, upcoming appointments, etc.  Non-urgent messages can be sent to your provider as  well.   To learn more about what you can do with MyChart, go to [email or web address].    Your next appointment:   12 month(s)  The format for your next appointment:   In Person  Provider:   Glenetta Hew, MD    Other Instructions    You are cleared for surgery no cardiac work up is needed     Leonie Man, MD, MS Glenetta Hew, M.D., M.S. Interventional Cardiologist  [place]  Pager # [phone] Phone # [phone] [address] [place], [place] [address]   Thank you for choosing [facility]!!

## 2022-02-02 NOTE — Patient Instructions (Signed)
Medication Instructions:    No changes  *If you need a refill on your cardiac medications before your next appointment, please call your pharmacy*   Lab Work:  Not needed  If you have labs (blood work) drawn today and your tests are completely normal, you will receive your results only by: [place] (if you have MyChart) OR A paper copy in the mail If you have any lab test that is abnormal or we need to change your treatment, we will call you to review the results.   Testing/Procedures:  Not needed  Follow-Up: At [facility], you and your health needs are our priority.  As part of our continuing mission to provide you with exceptional heart care, we have created designated Provider Care Teams.  These Care Teams include your primary Cardiologist (physician) and Advanced Practice Providers (APPs -  Physician Assistants and Nurse Practitioners) who all work together to provide you with the care you need, when you need it.  We recommend signing up for the patient portal called "MyChart".  Sign up information is provided on this After Visit Summary.  MyChart is used to connect with patients for Virtual Visits (Telemedicine).  Patients are able to view lab/test results, encounter notes, upcoming appointments, etc.  Non-urgent messages can be sent to your provider as well.   To learn more about what you can do with MyChart, go to [email or web address].    Your next appointment:   12 month(s)  The format for your next appointment:   In Person  Provider:   Glenetta Hew, MD    Other Instructions    You are cleared for surgery no cardiac work up is needed

## 2022-02-05 ENCOUNTER — Encounter: Payer: Self-pay | Admitting: Internal Medicine

## 2022-02-08 NOTE — Progress Notes (Incomplete)
Primary Care Provider: Janie Gray, [place] Cardiologist: Glenetta Hew, MD Electrophysiologist: None  Clinic Note: No chief complaint on file.  ===================================  ASSESSMENT/PLAN   Problem List Items Addressed This Visit   None  ===================================  HPI:    Olivia Gray is a 88 y.o. female with a PMH below who presents today for delayed 39-monthfollow-up.  2004: CAD s/p post CABG x4 in (SVG to LAD, SVG to D1, SVG to OM1, SVG to R PDA),  Myoview in 2016-nonischemic MVP - trivial by Echo Echo 08/25/19: EF 55 to 60%.  No RWMA. Gr1 DD.  Moderate LAE. Mild MVP with trivial MR, SoV Sclerosis w/o Stenosis. H/o Br Ca s/o Chemo.XRT HTN, HDL, prediabetes,  Legally Blind Subdural Hematoma - 2/2 fall in Late 2020 (stable by Head CT in 2022. Husband found her lying on the floor with clonic movement and right-sided gaze => ? Sz Disorder Head CT showed bilateral subdural hematomas => NSgx OR forBurr Holes/ drainage  Bradycardia - noted during 07/2019 admission for fall with multiple injuries. SR with PACs - Bigeminy - rates in 40s => Carvedilol d/c'd & Amlodipine decreased to 5 mg  Olivia Levelwas last seen by Olivia Santee PA on May 12, 2021 for preop evaluation.  Recent Hospitalizations: ***  Reviewed  CV studies:    The following studies were reviewed today: (if available, images/films reviewed: From Epic Chart or Care Everywhere) Echocardiogram performed 08/25/2019 showed EF 55 to 60%, moderate LAE.  She was discharged on a Zio patch. : 09/20/2019 her Zio patch was reviewed.  Her heart rate ranged from 45-1 70s.  She denied feeling dizzy or lightheaded.  She had multiple episodes of short bursts of atrial tachycardia versus SVT.  These episodes were very transient and only lasted 7 beats or less  08/06/21 1. Left ventricular ejection fraction, by estimation, is 55 to 60%. The  left ventricle has normal function. The left  ventricle has no regional  wall motion abnormalities. Left ventricular diastolic parameters are  consistent with Grade I diastolic  dysfunction (impaired relaxation).   2. Right ventricular systolic function is normal. The right ventricular  size is normal. There is normal pulmonary artery systolic pressure.   3. Left atrial size was mildly dilated.   4. The mitral valve is myxomatous. Trivial mitral valve regurgitation. No  evidence of mitral stenosis. There is mild late systolic prolapse of both  leaflets of the mitral valve.   5. The aortic valve is tricuspid. There is mild calcification of the  aortic valve. There is mild thickening of the aortic valve. Aortic valve  regurgitation is mild. Aortic valve sclerosis/calcification is present,  without any evidence of aortic  stenosis.   6. The inferior vena cava is normal in size with greater than 50%  respiratory variability, suggesting right atrial pressure of 3 mmHg.   Interval History:   Olivia Gray  CV Review of Symptoms (Summary) Cardiovascular ROS: {roscv:310661}  REVIEWED OF SYSTEMS   ROS  I have reviewed and (if needed) personally updated the patient's problem list, medications, allergies, past medical and surgical history, social and family history.   PAST MEDICAL HISTORY   Past Medical History:  Diagnosis Date  . Breast cancer ([facility] 01/29/[place]   Left Breast Cancer  . Breast [place] of upper-outer quadrant of [place] 12[place]  . CAD, multiple vessel 2004   Myoview 2011: Mild Basal-mid Inferolateral "ischemia", EF ~76?% -- Anormal, but LOW RISK  .  Cancer ([place])   . Essential hypertension   . History of radiation therapy 03/11/16-04/29/16   left breast/axilla 50.4 Gy in 28 fractions, boost of 10 Gy in 5 fractions  . Hyperlipidemia LDL goal <70    On statin.  . Legally blind   . Mitral valve prolapse 02/07/2008; April 2015   a) Mild Anterior MVP, with mild MR.;; b) Echo 07/2013: EF 55-60%, Gr 1 DD,  no WMA, Mild central MR with late systolic prolapse   . Personal history of radiation therapy 2017   Left Breast Cancer  . Pre-diabetes   . Retinitis pigmentosa of both eyes     now essentially legally blind.  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 S/P CABG x 4 2004   SVG-LAD, SVG-D1, SVG-OM1, SVG-RPDA. (Native LIMA was 100% occluded)    PAST SURGICAL HISTORY   Past Surgical History:  Procedure Laterality Date  . BRAIN SURGERY    . BREAST LUMPECTOMY Left 01/29/2016  . BREAST LUMPECTOMY WITH RADIOACTIVE SEED LOCALIZATION Left 01/29/2016   Procedure: LEFT BREAST LUMPECTOMY WITH RADIOACTIVE SEED LOCALIZATION;  Surgeon: [name], MD;  Location: [facility];  Service: General;  Laterality: Left;  . BREAST SURGERY Bilateral    cyst removal  . CARDIAC CATHETERIZATION  05/22/2009   all grafts patent ,LV systolic fx normal,small -vessel diabetic disease in native vessels   . CARDIAC SURGERY    . CHOLECYSTECTOMY  02/05/2005  . CORONARY ARTERY BYPASS GRAFT  07/21/2002   in [place] ; SVG-LAD, SVG-D1, SVG-OM1, SVG RPDA.  Marland Kitchen CRANIOTOMY Bilateral 04/09/2019   Procedure: BILATERAL BURR HOLES FOR EVACUATION SUBDURAL HEMATOMA;  Surgeon: Judith Part, MD;  Location: [place];  Service: Neurosurgery;  Laterality: Bilateral;  . NM MYOCAR PERF WALL MOTION  04/2009   EF 76%; mild ischeia basal inferolateral,mid inferoinlateral regions(s), abn pharmacologic nuc test, deffect indicte new ischemia,LV syst.fx normal --> cath showing no obstructive targets.  Marland Kitchen NM MYOVIEW LTD  June 2016   Normal perfusion. Normal study. LOW RISK. Normal EF (55%). Compared to prior study, no ischemic changes present.  . TRANSTHORACIC ECHOCARDIOGRAM  4/20221    EF 50 and 55%.  No R WMA.  Moderate LAE.  Mild to moderate aortic sclerosis but no stenosis.    Immunization History   Administered Date(s) Administered  . PFIZER Comirnaty(Gray Top)Covid-19 Tri-Sucrose Vaccine 06/04/2020  . PFIZER(Purple Top)SARS-COV-2 Vaccination 10/26/2019, 11/23/2019  . Pneumococcal-Unspecified 04/27/2013    MEDICATIONS/ALLERGIES   Current Meds  Medication Sig  . amLODipine (NORVASC) 5 MG tablet Take 1 tablet (5 mg total) by mouth daily.  Marland Kitchen aspirin 81 MG chewable tablet Chew 1 tablet (81 mg total) by mouth daily.  . D 1000 25 MCG (1000 UT) capsule Take 1,000 Units by mouth daily.  Marland Kitchen escitalopram (LEXAPRO) 10 MG tablet Take 10 mg by mouth daily.  . fexofenadine (ALLEGRA) 180 MG tablet Take 1 tablet (180 mg total) by mouth daily.  Marland Kitchen ipratropium (ATROVENT) 0.06 % nasal spray 2 sprays into each nostril four (4) times a day.  . latanoprost (XALATAN) 0.005 % ophthalmic solution   . montelukast (SINGULAIR) 10 MG tablet Take 1 tablet (10 mg total) by mouth at bedtime.  . nitroGLYCERIN (NITROSTAT) 0.4 MG SL tablet Place 1 tablet (0.4 mg total) under the tongue every 5 (five) minutes as needed for chest pain.  Marland Kitchen PRESCRIPTION MEDICATION Place 1 drop  into both eyes daily. Medication for eye pressure. ( Patient don't know the name)  . simvastatin (ZOCOR) 10 MG tablet Take 10 mg by mouth daily.  . valsartan (DIOVAN) 320 MG tablet TAKE 1 TABLET BY MOUTH DAILY TO REPLACE IRBESARTAN  . vitamin B-12 (CYANOCOBALAMIN) 100 MCG tablet Take 100 mcg by mouth daily.  . [DISCONTINUED] letrozole ([place]) 2.5 MG tablet TAKE 1 TABLET BY MOUTH EVERY DAY  . [DISCONTINUED] temazepam (RESTORIL) 15 MG capsule Take 1 capsule (15 mg total) by mouth at bedtime as needed for sleep.    No Known Allergies  SOCIAL HISTORY/FAMILY HISTORY   Reviewed in Epic:  Pertinent findings:  Social History   Tobacco Use  . Smoking status: Never  . Smokeless tobacco: Never  Substance Use Topics  . Alcohol use: Not Currently  . Drug use: Not Currently   Social History   Social History Narrative   ** Merged History  Encounter **       She is married with 2 children. Does not smoke or drink alcohol. She does routine exercise roughly 3-4 days a week where she follows a   The video. Weights stomach exercises jumping jacks and other aerobic type exercises.    OBJCTIVE -PE, EKG, labs   Wt Readings from Last 3 Encounters:  02/02/22 121 lb 6.4 oz (55.1 kg)  09/18/21 113 lb 3.2 oz (51.3 kg)  05/12/21 119 lb (54 kg)    Physical Exam: BP (!) 148/74   Pulse 64   Ht '5\' 4"'$  (1.626 m)   Wt 121 lb 6.4 oz (55.1 kg)   SpO2 99%   BMI 20.84 kg/m  Physical Exam   Adult ECG Report  Rate: *** ;  Rhythm: {rhythm:17366};   Narrative Interpretation: ***  Recent Labs:  ***  No results found for: "CHOL", "HDL", "LDLCALC", "LDLDIRECT", "TRIG", "CHOLHDL" Lab Results  Component Value Date   CREATININE 0.76 08/26/2019   BUN 17 08/26/2019   NA 140 08/26/2019   K 3.9 08/26/2019   CL 109 08/26/2019   CO2 22 08/26/2019      Latest Ref Rng & Units 08/26/2019    3:04 AM 08/25/2019    6:54 AM 08/24/2019    9:08 AM  CBC  WBC 4.0 - 10.5 K/uL 4.4  3.6  4.1   Hemoglobin 12.0 - 15.0 g/dL 10.4  10.8  10.6   Hematocrit 36.0 - 46.0 % 31.9  33.1  32.4   Platelets 150 - 400 K/uL 160  150  149     No results found for: "HGBA1C" Lab Results  Component Value Date   TSH 0.697 04/08/2019    ================================================== I spent a total of ***minutes with the patient spent in direct patient consultation.  Additional time spent with chart review  / charting (studies, outside notes, etc): *** min Total Time: *** min  Current medicines are reviewed at length with the patient today.  (+/- concerns) ***  Notice: This dictation was prepared with Dragon dictation along with smart phrase technology. Any transcriptional errors that result from this process are unintentional and may not be corrected upon review.  Studies Ordered:   No orders of the defined types were placed in this encounter.  No orders of  the defined types were placed in this encounter.   Patient Instructions / Medication Changes & Studies & Tests Ordered   There are no Patient Instructions on file for this visit.     Leonie Man, MD, MS Glenetta Hew, M.D., M.S.  Interventional Cardiologist  [facility]  Pager # [phone] Phone # [phone] [address] [place], [place] [address]   Thank you for choosing [facility]!!

## 2022-02-09 ENCOUNTER — Encounter: Payer: Self-pay | Admitting: Cardiology

## 2022-02-09 DIAGNOSIS — Z01810 Encounter for preprocedural cardiovascular examination: Secondary | ICD-10-CM | POA: Insufficient documentation

## 2022-02-09 NOTE — Assessment & Plan Note (Signed)
Labs were checked last year.  I do not see recent labs.  LDL at the time was 82.  She is on 10 mg simvastatin but has not tolerated other statins.  Based on her cardiac history I would like to see her lipids better controlled but I do not have recent labs.  I had previously recommended switching from simvastatin 10 mg to at least rosuvastatin 10 or 20 mg.  That would potentially get [place] to goal.

## 2022-02-09 NOTE — Assessment & Plan Note (Signed)
Bite metabolic illness, she certainly would not meet criteria to adequately assess symptoms, but planned surgery is relatively low risk.  She has normal EF, has not had a stroke, normal renal function, nondiabetic.  As such, despite her other comorbidities from a cardiovascular standpoint would be relatively low risk for any surgeries or procedures unless high risk surgeries.  Would recommend proceeding to the OR without any further cardiovascular evaluation. Okay to hold aspirin 5 to 7 days preop for surgeries or procedures.  Please restart when safe postop.

## 2022-02-09 NOTE — Assessment & Plan Note (Signed)
No active angina symptoms.  She is not very active and therefore probably is challenging herself very much.  Last ischemic evaluation was in 2016.  Nonischemic Myoview at that time. Given her advanced age and comorbidities, would not recommend further ischemic evaluation unless she has significant symptoms. EF remains stable/normal with no RWMA.  This would suggest graft patency.

## 2022-02-09 NOTE — Assessment & Plan Note (Signed)
Pretty much stable mild MVP on echo.  Would not recheck a follow-up for another couple years unless symptoms warrant.  No need for SBE prophylaxis.

## 2022-02-09 NOTE — Assessment & Plan Note (Signed)
Stable from a cardiac standpoint.  No active angina or heart failure symptoms.  Stable echo with no wall motion abnormalities.   She is on 81 mg aspirin along with statin although this theoretically could be increased to 20 mg simvastatin versus 20 mg rosuvastatin for better control.  Not on beta-blocker because of bradycardia  On ARB for afterload reduction and calcium channel blocker BP/NT angina  In the absence of symptoms, no need for ischemic evaluation.

## 2022-02-09 NOTE — Assessment & Plan Note (Signed)
BP stable on current dose of losartan and amlodipine.  Again, looking for mild permissive hypertension.  Would tolerate blood pressures into the 160 mm range as long as she has no symptoms.  Would not want to titrate medications further.  Avoiding diuretic to avoid orthostasis.  No change for now.  No beta-blocker because of bradycardia issues.

## 2022-02-18 ENCOUNTER — Other Ambulatory Visit: Payer: Self-pay | Admitting: Cardiology

## 2022-02-19 ENCOUNTER — Ambulatory Visit: Payer: Medicare Other | Admitting: Internal Medicine

## 2022-02-26 DIAGNOSIS — C50912 Malignant neoplasm of unspecified site of left female breast: Secondary | ICD-10-CM | POA: Diagnosis not present

## 2022-03-03 ENCOUNTER — Other Ambulatory Visit (HOSPITAL_COMMUNITY): Payer: Self-pay

## 2022-03-03 ENCOUNTER — Ambulatory Visit (HOSPITAL_COMMUNITY)
Admission: RE | Admit: 2022-03-03 | Discharge: 2022-03-03 | Disposition: A | Discharge status: Home / Self Care | Payer: Medicare Other | Source: Ambulatory Visit

## 2022-03-03 DIAGNOSIS — K644 Residual hemorrhoidal skin tags: Secondary | ICD-10-CM

## 2022-03-03 DIAGNOSIS — K6289 Other specified diseases of anus and rectum: Secondary | ICD-10-CM

## 2022-03-03 DIAGNOSIS — K6389 Other specified diseases of intestine: Secondary | ICD-10-CM | POA: Diagnosis not present

## 2022-03-03 DIAGNOSIS — Z23 Encounter for immunization: Secondary | ICD-10-CM | POA: Diagnosis not present

## 2022-03-03 DIAGNOSIS — K5909 Other constipation: Secondary | ICD-10-CM | POA: Diagnosis not present

## 2022-03-03 MED ORDER — GADOBUTROL 1 MMOL/ML IV SOLN
5.5000 mL | Freq: Once | INTRAVENOUS | Status: AC | PRN
  Administered 2022-03-03: 5.5 mL via INTRAVENOUS

## 2022-03-11 DIAGNOSIS — C50912 Malignant neoplasm of unspecified site of left female breast: Secondary | ICD-10-CM | POA: Diagnosis not present

## 2022-03-25 DIAGNOSIS — K224 Dyskinesia of esophagus: Secondary | ICD-10-CM | POA: Diagnosis not present

## 2022-03-25 DIAGNOSIS — K219 Gastro-esophageal reflux disease without esophagitis: Secondary | ICD-10-CM | POA: Diagnosis not present

## 2022-03-31 DIAGNOSIS — K5904 Chronic idiopathic constipation: Secondary | ICD-10-CM | POA: Diagnosis not present

## 2022-03-31 DIAGNOSIS — E782 Mixed hyperlipidemia: Secondary | ICD-10-CM | POA: Diagnosis not present

## 2022-03-31 DIAGNOSIS — I10 Essential (primary) hypertension: Secondary | ICD-10-CM | POA: Diagnosis not present

## 2022-03-31 DIAGNOSIS — R634 Abnormal weight loss: Secondary | ICD-10-CM | POA: Diagnosis not present

## 2022-04-17 DIAGNOSIS — R0982 Postnasal drip: Secondary | ICD-10-CM | POA: Diagnosis not present

## 2022-04-17 DIAGNOSIS — J3489 Other specified disorders of nose and nasal sinuses: Secondary | ICD-10-CM | POA: Diagnosis not present

## 2022-04-29 DIAGNOSIS — J3489 Other specified disorders of nose and nasal sinuses: Secondary | ICD-10-CM | POA: Diagnosis not present

## 2022-05-15 ENCOUNTER — Telehealth: Payer: Self-pay | Admitting: *Deleted

## 2022-05-15 NOTE — Telephone Encounter (Signed)
Received call from pt with complaint of ongoing left sided breast pain.  Pt denies recent injury or trauma but states left breast is swollen.  Pt requesting office visit for breast exam and further evaluation.  [facility] visit scheduled, pt educated of appt date and time and verbalized understanding.

## 2022-05-18 ENCOUNTER — Other Ambulatory Visit: Payer: Self-pay

## 2022-05-18 ENCOUNTER — Inpatient Hospital Stay: Payer: Medicare Other | Attending: Adult Health | Admitting: Adult Health

## 2022-05-18 ENCOUNTER — Telehealth: Payer: Self-pay

## 2022-05-18 VITALS — BP 131/73 | HR 77 | Temp 97.7°F | Resp 18 | Ht 64.0 in | Wt 119.9 lb

## 2022-05-18 DIAGNOSIS — C50412 Malignant neoplasm of upper-outer quadrant of left female breast: Secondary | ICD-10-CM

## 2022-05-18 DIAGNOSIS — N644 Mastodynia: Secondary | ICD-10-CM

## 2022-05-18 DIAGNOSIS — M79622 Pain in left upper arm: Secondary | ICD-10-CM | POA: Diagnosis not present

## 2022-05-18 DIAGNOSIS — Z923 Personal history of irradiation: Secondary | ICD-10-CM | POA: Diagnosis not present

## 2022-05-18 DIAGNOSIS — Z170 Estrogen receptor positive status [ER+]: Secondary | ICD-10-CM

## 2022-05-18 DIAGNOSIS — Z853 Personal history of malignant neoplasm of breast: Secondary | ICD-10-CM | POA: Insufficient documentation

## 2022-05-18 NOTE — Assessment & Plan Note (Signed)
Olivia Gray is an 89 year old woman with stage IIa ER/PR positive left-sided breast cancer diagnosed in August 2017 status postlumpectomy, adjuvant radiation, and 5 years of antiestrogen therapy with letrozole which she completed in May 2023.  Patient has new onset left-sided breast and axillary pain.  She has not undergone mammography since 2019.  I placed orders for a bilateral breast diagnostic mammogram and left breast and axillary ultrasound to further evaluate the area of concern.  While it is unclear if I palpated anything abnormal the left breast is swollen with postradiation changes and I reviewed with her that the mammogram and ultrasound can identify things that I cannot feel.  The patient and her husband verbalized understanding of the above.  I sent a message to my nurse to get her scheduled for the above.  We will follow-up with her based on those results and she has follow-up with Dr. Lindi Adie in May 2024.

## 2022-05-18 NOTE — Telephone Encounter (Signed)
Called to give appt for mammogram/ ultrasound on 1/31 at 1:10 pm, arrive at 1250 to the breast center. Printed off a scheduled and take to husband in the lobby. He is aware of appt and appreciated the schedule.

## 2022-05-18 NOTE — Progress Notes (Signed)
Olivia Gray:    Olivia Morning, DO [address] [place] [address]   DIAGNOSIS:  Cancer Staging  Breast cancer of upper-outer quadrant of left female breast [facility]) Staging form: Breast, AJCC 7th Edition - Clinical: Stage IA (T1b, N0, M0) - Unsigned - Pathologic: Stage IIA (T1b, N1, cM0) - Unsigned   SUMMARY OF ONCOLOGIC HISTORY: Oncology History  Breast cancer of upper-outer quadrant of left female breast ([place])  12/26/2015 Initial Diagnosis   Left breast biopsy 1:00: Invasive ductal carcinoma grade 1, DCIS,ER 95%, PR 95%, Ki-67 10%, HER-2 negative ratio 1.12; left breast mass 1:00 position 7 mm size axilla ultrasound negative, T1 BN 0 stage IA clinical stage   01/29/2016 Surgery   Left lumpectomy: IDC with DCIS, 1 cm, margins negative, fibrocystic changes,1/2 lymph nodes positive ( intramammary nodes)T1bN1 stage II a   03/11/2016 - 04/29/2016 Radiation Therapy   Adj XRT (kinard): 1)Left breast/axilla / 50.4 Gy in 28 fractions  2) Left breast boost / 10 Gy in 5 fractions.     05/07/2016 - 08/2021 Anti-estrogen oral therapy   Letrozole 2.5 mg daily along with Prolia every 6 months     CURRENT THERAPY: Observation  INTERVAL HISTORY: Olivia Gray 89 y.o. female returns for urgent evaluation of ongoing left-sided breast pain and swelling.  This has been going on for a few weeks and she has become increasingly concerned about this discomfort.  There is a focal area underneath her left axilla where she is feeling the discomfort.   Patient Active Problem List   Diagnosis Date Noted   Preoperative cardiovascular examination 02/09/2022   Unwitnessed fall 11/26/2019   Thoracic spine fracture ([place]) 08/22/2019   Subdural hematoma ([facility]) 04/09/2019   Lumbar pain 06/01/2017   Separation of left acromioclavicular joint, type 5 07/13/2016   Chronic rhinitis 06/05/2016   Osteoporosis 05/07/2016   Chronic vasomotor rhinitis 03/03/2016   Breast  cancer of upper-outer quadrant of left female breast ([place]) 12/27/2015   Musculoskeletal chest pain 09/10/2014   S/P CABG x 4    Essential hypertension     Class: Chronic   Hyperlipidemia LDL goal <70    Mitral valve prolapse 2015   CAD, multiple vessel 2004    has No Known Allergies.  MEDICAL HISTORY: Past Medical History:  Diagnosis Date   Bilateral subdural hematomas ([place]) 03/2019   s/p bilateral bur hole and ventricular drains placed in the subdural space. ->  Stable on head CT.   Breast cancer of upper-outer quadrant of left female breast ([place]) 12/27/2015   CAD, multiple vessel 2004   Myoview 2011: Mild Basal-mid Inferolateral "ischemia", EF ~76?% -- Anormal, but LOW RISK   Cancer ([place])    Essential hypertension    History of radiation therapy 03/11/16-04/29/16   left breast/axilla 50.4 Gy in 28 fractions, boost of 10 Gy in 5 fractions   Hyperlipidemia LDL goal <70    On statin.   Legally blind    Mitral valve prolapse 2015   Echo April 2023: Myxomatous MV- Mild late bileaflet MVP with trivial MR   Personal history of radiation therapy 2017   Left Breast Cancer   Pre-diabetes    Retinitis pigmentosa of both eyes     now essentially legally blind.  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 (Native LIMA was 100% occluded)    SURGICAL HISTORY: Past Surgical History:  Procedure Laterality Date   BRAIN SURGERY     BREAST LUMPECTOMY Left 01/29/2016   BREAST LUMPECTOMY WITH RADIOACTIVE SEED LOCALIZATION Left 01/29/2016   Procedure: LEFT BREAST LUMPECTOMY WITH RADIOACTIVE SEED LOCALIZATION;  Surgeon: [name], MD;  Location: [facility];  Service: General;  Laterality: Left;   BREAST SURGERY Bilateral    cyst removal   CARDIAC CATHETERIZATION  05/22/2009   all grafts patent ,LV  systolic fx normal,small -vessel diabetic disease in native vessels    CARDIAC SURGERY     CHOLECYSTECTOMY  02/05/2005   CORONARY ARTERY BYPASS GRAFT  07/21/2002   in [place] ; SVG-LAD, SVG-D1, SVG-OM1, SVG RPDA.   CRANIOTOMY Bilateral 04/09/2019   Procedure: BILATERAL BURR HOLES FOR EVACUATION SUBDURAL HEMATOMA;  Surgeon: Judith Part, MD;  Location: [facility];  Service: Neurosurgery;  Laterality: Bilateral;   NM MYOCAR PERF WALL MOTION  04/2009   EF 76%; mild ischeia basal inferolateral,mid inferoinlateral regions(s), abn pharmacologic nuc test, deffect indicte new ischemia,LV syst.fx normal --> cath showing no obstructive targets.   NM MYOVIEW LTD  09/2014   Normal perfusion. Normal study. LOW RISK. Normal EF (55%). Compared to prior study, no ischemic changes present.   TRANSTHORACIC ECHOCARDIOGRAM  07/2019    EF 50 and 55%.  No R WMA.  Moderate LAE.  Mild to moderate aortic sclerosis but no stenosis.   TRANSTHORACIC ECHOCARDIOGRAM  07/2021   EF 55 to 60%.  No RWMA. Gr1 DD-mild LAE.  Myxomatous MV- Mild late bileaflet MVP with trivial MR, SoV Sclerosis w/o Stenosis, mild AI.  Normal RVP/PAP and RAP    SOCIAL HISTORY: Social History   Socioeconomic History   Marital status: Married    Spouse name: Not on file   Number of children: 2   Years of education: Not on file   Highest education level: Not on file  Occupational History   Not on file  Tobacco Use   Smoking status: Never   Smokeless tobacco: Never  Substance and Sexual Activity   Alcohol use: Not Currently   Drug use: Not Currently   Sexual activity: Yes    Birth control/protection: Post-menopausal  Other Topics Concern   Not on file  Social History Narrative   ** Merged History Encounter **       She is married with 2 children. Does not smoke or drink alcohol. She does routine exercise roughly 3-4 days a week where she follows a   The video. Weights stomach exercises jumping jacks and other aerobic type  exercises.   Social Determinants of Health   Financial Resource Strain: Not on file  Food Insecurity: Not on file  Transportation Needs: Not on file  Physical Activity: Not on file  Stress: Not on file  Social Connections: Not on file  Intimate Partner Violence: Not on file    FAMILY HISTORY: Family History  Problem Relation Age of Onset   Breast cancer Sister    Breast cancer Sister    Breast cancer Sister    Breast cancer Sister     Review of Systems  Constitutional:  Negative for appetite change, chills, fatigue, fever and unexpected weight change.  HENT:   Negative for hearing loss, lump/mass and trouble swallowing.   Eyes:  Negative for eye problems and icterus.  Respiratory:  Negative for chest tightness, cough and shortness of breath.   Cardiovascular:  Negative for chest  pain, leg swelling and palpitations.  Gastrointestinal:  Negative for abdominal distention, abdominal pain, constipation, diarrhea, nausea and vomiting.  Endocrine: Negative for hot flashes.  Genitourinary:  Negative for difficulty urinating.   Musculoskeletal:  Negative for arthralgias.  Skin:  Negative for itching and rash.  Neurological:  Negative for dizziness, extremity weakness, headaches and numbness.  Hematological:  Negative for adenopathy. Does not bruise/bleed easily.  Psychiatric/Behavioral:  Negative for depression. The patient is not nervous/anxious.       PHYSICAL EXAMINATION  ECOG PERFORMANCE STATUS: 1 - Symptomatic but completely ambulatory  Vitals:   05/18/22 1435  BP: 131/73  Pulse: 77  Resp: 18  Temp: 97.7 F (36.5 C)  SpO2: 100%    Physical Exam Constitutional:      General: She is not in acute distress.    Appearance: Normal appearance. She is not toxic-appearing.  HENT:     Head: Normocephalic and atraumatic.  Eyes:     General: No scleral icterus. Cardiovascular:     Rate and Rhythm: Normal rate and regular rhythm.     Pulses: Normal pulses.     Heart  sounds: Normal heart sounds.  Pulmonary:     Effort: Pulmonary effort is normal.     Breath sounds: Normal breath sounds.  Chest:     Comments: Right breast is benign, left breast status postlumpectomy and radiation.  There are radiation changes noted in the left breast with some mild swelling present questionable thickness in the upper breast.  I cannot palpate any lymphadenopathy in the left axilla however she does have centralized focal tenderness present. Abdominal:     General: Abdomen is flat. Bowel sounds are normal. There is no distension.     Palpations: Abdomen is soft.     Tenderness: There is no abdominal tenderness.  Musculoskeletal:        General: No swelling.     Cervical back: Neck supple.  Lymphadenopathy:     Cervical: No cervical adenopathy.  Skin:    General: Skin is warm and dry.     Findings: No rash.  Neurological:     General: No focal deficit present.     Mental Status: She is alert.  Psychiatric:        Mood and Affect: Mood normal.        Behavior: Behavior normal.     LABORATORY DATA: None for this visit   ASSESSMENT and THERAPY PLAN:   Breast cancer of upper-outer quadrant of left female breast [facility]) Myoma is an 89 year old woman with stage IIa ER/PR positive left-sided breast cancer diagnosed in August 2017 status postlumpectomy, adjuvant radiation, and 5 years of antiestrogen therapy with letrozole which she completed in May 2023.  Patient has new onset left-sided breast and axillary pain.  She has not undergone mammography since 2019.  I placed orders for a bilateral breast diagnostic mammogram and left breast and axillary ultrasound to further evaluate the area of concern.  While it is unclear if I palpated anything abnormal the left breast is swollen with postradiation changes and I reviewed with her that the mammogram and ultrasound can identify things that I cannot feel.  The patient and her husband verbalized understanding of the above.  I  sent a message to my nurse to get her scheduled for the above.  We will follow-Gray with her based on those results and she has follow-Gray with Dr. Lindi Adie in May 2024.  Total encounter time:20 minutes*in face-to-face visit time, chart review, lab review,  care coordination, order entry, and documentation of the encounter time.  Wilber Bihari, NP 05/18/22 3:02 PM Medical Oncology and Hematology [facility] [place], [place] [address] Tel. [phone]    Fax. [phone]  *Total Encounter Time as defined by the Centers for Medicare and Medicaid Services includes, in addition to the face-to-face time of a patient visit (documented in the note above) non-face-to-face time: obtaining and reviewing outside history, ordering and reviewing medications, tests or procedures, care coordination (communications with other health care professionals or caregivers) and documentation in the medical record.

## 2022-05-25 DIAGNOSIS — H6123 Impacted cerumen, bilateral: Secondary | ICD-10-CM | POA: Diagnosis not present

## 2022-05-27 ENCOUNTER — Ambulatory Visit
Admission: RE | Admit: 2022-05-27 | Discharge: 2022-05-27 | Disposition: A | Discharge status: Home / Self Care | Payer: Medicare Other | Source: Ambulatory Visit | Attending: Adult Health | Admitting: Adult Health

## 2022-05-27 ENCOUNTER — Ambulatory Visit: Payer: Medicare Other

## 2022-05-27 DIAGNOSIS — Z170 Estrogen receptor positive status [ER+]: Secondary | ICD-10-CM

## 2022-05-27 DIAGNOSIS — M79622 Pain in left upper arm: Secondary | ICD-10-CM

## 2022-05-27 DIAGNOSIS — Z853 Personal history of malignant neoplasm of breast: Secondary | ICD-10-CM | POA: Diagnosis not present

## 2022-05-27 DIAGNOSIS — R928 Other abnormal and inconclusive findings on diagnostic imaging of breast: Secondary | ICD-10-CM | POA: Diagnosis not present

## 2022-05-27 DIAGNOSIS — N644 Mastodynia: Secondary | ICD-10-CM

## 2022-07-09 ENCOUNTER — Other Ambulatory Visit: Payer: Self-pay | Admitting: Cardiology

## 2022-08-05 DIAGNOSIS — R0989 Other specified symptoms and signs involving the circulatory and respiratory systems: Secondary | ICD-10-CM | POA: Diagnosis not present

## 2022-08-05 DIAGNOSIS — R0982 Postnasal drip: Secondary | ICD-10-CM | POA: Diagnosis not present

## 2022-08-20 DIAGNOSIS — R195 Other fecal abnormalities: Secondary | ICD-10-CM | POA: Diagnosis not present

## 2022-08-20 DIAGNOSIS — R634 Abnormal weight loss: Secondary | ICD-10-CM | POA: Diagnosis not present

## 2022-08-20 DIAGNOSIS — R194 Change in bowel habit: Secondary | ICD-10-CM | POA: Diagnosis not present

## 2022-08-21 ENCOUNTER — Telehealth: Payer: Self-pay | Admitting: *Deleted

## 2022-08-21 ENCOUNTER — Telehealth: Payer: Self-pay | Admitting: Cardiology

## 2022-08-21 NOTE — Telephone Encounter (Signed)
  Patient Consent for Virtual Visit         Olivia Gray has provided verbal consent on 08/21/2022 for a virtual visit (video or telephone).   CONSENT FOR VIRTUAL VISIT FOR:  Olivia Gray  By participating in this virtual visit I agree to the following:  I hereby voluntarily request, consent and authorize [facility] and its employed or contracted physicians, physician assistants, nurse practitioners or other licensed health care professionals (the Practitioner), to provide me with telemedicine health care services (the "Services") as deemed necessary by the treating Practitioner. I acknowledge and consent to receive the Services by the Practitioner via telemedicine. I understand that the telemedicine visit will involve communicating with the Practitioner through live audiovisual communication technology and the disclosure of certain medical information by electronic transmission. I acknowledge that I have been given the opportunity to request an in-person assessment or other available alternative prior to the telemedicine visit and am voluntarily participating in the telemedicine visit.  I understand that I have the right to withhold or withdraw my consent to the use of telemedicine in the course of my care at any time, without affecting my right to future care or treatment, and that the Practitioner or I may terminate the telemedicine visit at any time. I understand that I have the right to inspect all information obtained and/or recorded in the course of the telemedicine visit and may receive copies of available information for a reasonable fee.  I understand that some of the potential risks of receiving the Services via telemedicine include:  Delay or interruption in medical evaluation due to technological equipment failure or disruption; Information transmitted may not be sufficient (e.g. poor resolution of images) to allow for appropriate medical decision making by the Practitioner;  and/or  In rare instances, security protocols could fail, causing a breach of personal health information.  Furthermore, I acknowledge that it is my responsibility to provide information about my medical history, conditions and care that is complete and accurate to the best of my ability. I acknowledge that Practitioner's advice, recommendations, and/or decision may be based on factors not within their control, such as incomplete or inaccurate data provided by me or distortions of diagnostic images or specimens that may result from electronic transmissions. I understand that the practice of medicine is not an exact science and that Practitioner makes no warranties or guarantees regarding treatment outcomes. I acknowledge that a copy of this consent can be made available to me via my patient portal [facility] MyChart), or I can request a printed copy by calling the office of [facility].    I understand that my insurance will be billed for this visit.   I have read or had this consent read to me. I understand the contents of this consent, which adequately explains the benefits and risks of the Services being provided via telemedicine.  I have been provided ample opportunity to ask questions regarding this consent and the Services and have had my questions answered to my satisfaction. I give my informed consent for the services to be provided through the use of telemedicine in my medical care

## 2022-08-21 NOTE — Telephone Encounter (Signed)
Spoke with patient's husband to confirm we have both of these medications on file. Verified dose as well, he confirms dosage

## 2022-08-21 NOTE — Telephone Encounter (Signed)
   Name: Olivia Gray  DOB: August 12, 1932  MRN: [id number]  Primary Cardiologist: Bryan Lemma, MD   Preoperative team, please contact this patient and set up a phone call appointment for further preoperative risk assessment. Please obtain consent and complete medication review. Thank you for your help.  I confirm that guidance regarding antiplatelet and oral anticoagulation therapy has been completed and, if necessary, noted below.  Per office protocol, if patient is without any new symptoms or concerns at the time of their virtual visit, she may hold Aspirin for 5-7 days prior to procedure. Please resume Aspirin as soon as possible postprocedure, at the discretion of the surgeon.    Joylene Grapes, NP 08/21/2022, 7:34 AM [facility]

## 2022-08-21 NOTE — Telephone Encounter (Signed)
   Pre-operative Risk Assessment    Patient Name: Olivia Gray  DOB: 11/29/1932 MRN: [id number]      Request for Surgical Clearance    Procedure:   COLONOSCOPY & EGD  Date of Surgery:  Clearance 08/26/22                                 Surgeon:  DR. [facility] Surgeon's Group or Practice Name:  [facility] Phone number:  [phone] Fax number:  [phone]   Type of Clearance Requested:   - Medical  - Pharmacy:  Hold Aspirin NOT INDICATED HOW LONG   Type of Anesthesia:   PROPOFOL   Additional requests/questions:    Wilhemina Cash   08/21/2022, 7:16 AM

## 2022-08-21 NOTE — Telephone Encounter (Signed)
Husband is calling back, states he left two medication off her list temazepam (RESTORIL) 15 MG capsule and methscopolamine (PAMINE) 2.5 MG TABS tablet. These two were already listed under medication. Please advise

## 2022-08-23 NOTE — Progress Notes (Unsigned)
Virtual Visit via Telephone Note   Because of Olivia Gray's co-morbid illnesses, she is at least at moderate risk for complications without adequate follow up.  This format is felt to be most appropriate for this patient at this time.  The patient did not have access to video technology/had technical difficulties with video requiring transitioning to audio format only (telephone).  All issues noted in this document were discussed and addressed.  No physical exam could be performed with this format.  Please refer to the patient's chart for her consent to telehealth for [facility].  Evaluation Performed:  Preoperative cardiovascular risk assessment _____________   Date:  08/23/2022   Patient ID:  Olivia Gray, DOB 1933/01/15, MRN [id number] Patient Location:  Home Provider location:   Office  Primary Care Provider:  Irena Reichmann, DO Primary Cardiologist:  Bryan Lemma, MD  Chief Complaint / Patient Profile   89 y.o. y/o female with a h/o CAD s/p CABG times 07/2002, HTN, subdural hematoma breast CA s/p chemo/radiation therapy, HTN, HLD, prediabetes , who is pending colonoscopy and EGD and presents today for telephonic preoperative cardiovascular risk assessment.  History of Present Illness    Olivia Gray is a 89 y.o. female who presents via audio/video conferencing for a telehealth visit today.  Pt was last seen in cardiology clinic on 06/05/2021 by Dr. Herbie Baltimore.  At that time Olivia Gray was doing well and stable from a cardiac perspective.  The patient is now pending procedure as outlined above. Since her last visit, she reports doing well from a cardiac perspective.  She was advised to reach out to her gastroenterologist regarding holding aspirin since she is within the 5 to 7-day window.  Her procedure is scheduled for 08/26/2022.   She denies chest pain, shortness of breath, lower extremity edema, fatigue, palpitations, melena, hematuria, hemoptysis, diaphoresis, weakness,  presyncope, syncope, orthopnea, and PND.    Past Medical History    Past Medical History:  Diagnosis Date   Bilateral subdural hematomas ([facility]) 03/2019   s/p bilateral bur hole and ventricular drains placed in the subdural space. ->  Stable on head CT.   Breast cancer of upper-outer quadrant of left female breast ([facility]) 12/27/2015   CAD, multiple vessel 2004   Myoview 2011: Mild Basal-mid Inferolateral "ischemia", EF ~76?% -- Anormal, but LOW RISK   Cancer ([facility])    Essential hypertension    History of radiation therapy 03/11/16-04/29/16   left breast/axilla 50.4 Gy in 28 fractions, boost of 10 Gy in 5 fractions   Hyperlipidemia LDL goal <70    On statin.   Legally blind    Mitral valve prolapse 2015   Echo April 2023: Myxomatous MV- Mild late bileaflet MVP with trivial MR   Personal history of radiation therapy 2017   Left Breast Cancer   Pre-diabetes    Retinitis pigmentosa of both eyes     now essentially legally blind.  Has Maureen Ralphs syndrome which involves visual hallucinations of seeing people there that are not there and slide show images of various items scenery    S/P CABG x 4 2004   SVG-LAD, SVG-D1, SVG-OM1, SVG-RPDA. (Native LIMA was 100% occluded)   Past Surgical History:  Procedure Laterality Date   BRAIN SURGERY     BREAST LUMPECTOMY Left 01/29/2016   BREAST LUMPECTOMY WITH RADIOACTIVE SEED LOCALIZATION Left 01/29/2016   Procedure: LEFT BREAST LUMPECTOMY WITH RADIOACTIVE SEED LOCALIZATION;  Surgeon: Emelia Loron, MD;  Location: [facility];  Service: General;  Laterality: Left;   BREAST SURGERY Bilateral    cyst removal   CARDIAC CATHETERIZATION  05/22/2009   all grafts patent ,LV systolic fx normal,small -vessel diabetic disease in native vessels    CARDIAC SURGERY     CHOLECYSTECTOMY  02/05/2005   CORONARY ARTERY BYPASS GRAFT  07/21/2002   in [place] ; SVG-LAD, SVG-D1, SVG-OM1, SVG RPDA.   CRANIOTOMY Bilateral 04/09/2019   Procedure:  BILATERAL BURR HOLES FOR EVACUATION SUBDURAL HEMATOMA;  Surgeon: Jadene Pierini, MD;  [facility];  Service: Neurosurgery;  Laterality: Bilateral;   NM MYOCAR PERF WALL MOTION  04/2009   EF 76%; mild ischeia basal inferolateral,mid inferoinlateral regions(s), abn pharmacologic nuc test, deffect indicte new ischemia,LV syst.fx normal --> cath showing no obstructive targets.   NM MYOVIEW LTD  09/2014   Normal perfusion. Normal study. LOW RISK. Normal EF (55%). Compared to prior study, no ischemic changes present.   TRANSTHORACIC ECHOCARDIOGRAM  07/2019    EF 50 and 55%.  No R WMA.  Moderate LAE.  Mild to moderate aortic sclerosis but no stenosis.   TRANSTHORACIC ECHOCARDIOGRAM  07/2021   EF 55 to 60%.  No RWMA. Gr1 DD-mild LAE.  Myxomatous MV- Mild late bileaflet MVP with trivial MR, SoV Sclerosis w/o Stenosis, mild AI.  Normal RVP/PAP and RAP    Allergies  No Known Allergies  Home Medications    Prior to Admission medications   Medication Sig Start Date End Date Taking? Authorizing Provider  amLODipine (NORVASC) 5 MG tablet TAKE 1 TABLET (5 MG TOTAL) BY MOUTH DAILY. 07/10/22   Marykay Lex, MD  aspirin 81 MG chewable tablet Chew 1 tablet (81 mg total) by mouth daily. 08/29/19   Meuth, Brooke A, PA-C  D 1000 25 MCG (1000 UT) capsule Take 1,000 Units by mouth daily. 09/13/19   [provider]  docusate sodium (COLACE) 250 MG capsule Take 250 mg by mouth daily.    [provider]  escitalopram (LEXAPRO) 10 MG tablet Take 10 mg by mouth daily.    [provider]  ipratropium (ATROVENT) 0.06 % nasal spray 2 sprays into each nostril four (4) times a day. 10/17/20   [provider]  latanoprost (XALATAN) 0.005 % ophthalmic solution  09/15/19   [provider]  methscopolamine (PAMINE) 2.5 MG TABS tablet Take 2.5 mg by mouth 2 (two) times daily as needed (nasal). 08/06/22   [provider]  polyethylene glycol (MIRALAX / GLYCOLAX) 17 g  packet Take 17 g by mouth daily.    [provider]  PRESCRIPTION MEDICATION Place 1 drop into both eyes daily. Medication for eye pressure. ( Patient don't know the name)    [provider]  simvastatin (ZOCOR) 10 MG tablet Take 10 mg by mouth daily.    [provider]  temazepam (RESTORIL) 15 MG capsule Take 15 mg by mouth daily. 09/22/21   [provider]  valsartan (DIOVAN) 320 MG tablet TAKE 1 TABLET BY MOUTH DAILY TO REPLACE IRBESARTAN 02/19/22   Marykay Lex, MD  vitamin B-12 (CYANOCOBALAMIN) 100 MCG tablet Take 100 mcg by mouth daily.    [provider]    Physical Exam    Vital Signs:  Olivia Gray does not have vital signs available for review today.  Given telephonic nature of communication, physical exam is limited. AAOx3. NAD. Normal affect.  Speech and respirations are unlabored.  Accessory Clinical Findings    None  Assessment & Plan    1.  Preoperative Cardiovascular Risk Assessment:  -Patient's RCRI score is 0.9%  The patient affirms she has been doing well without any new cardiac symptoms. They are able to achieve 4 METS without cardiac limitations. Therefore, based on ACC/AHA guidelines, the patient would be at acceptable risk for the planned procedure without further cardiovascular testing. The patient was advised that if she develops new symptoms prior to surgery to contact our office to arrange for a follow-up visit, and she verbalized understanding.   The patient was advised that if she develops new symptoms prior to surgery to contact our office to arrange for a follow-up visit, and she verbalized understanding.  Patient may hold Aspirin for 5-7 days prior to procedure.    A copy of this note will be routed to requesting surgeon.  Time:   Today, I have spent 8 minutes with the patient with telehealth technology discussing medical history, symptoms, and management plan.     Napoleon Form, Leodis Rains,  NP  08/23/2022, 6:21 PM

## 2022-08-24 ENCOUNTER — Ambulatory Visit: Payer: Medicare Other | Attending: Cardiovascular Disease

## 2022-08-24 DIAGNOSIS — Z01810 Encounter for preprocedural cardiovascular examination: Secondary | ICD-10-CM

## 2022-08-26 DIAGNOSIS — K319 Disease of stomach and duodenum, unspecified: Secondary | ICD-10-CM | POA: Diagnosis not present

## 2022-08-26 DIAGNOSIS — K2971 Gastritis, unspecified, with bleeding: Secondary | ICD-10-CM | POA: Diagnosis not present

## 2022-08-26 DIAGNOSIS — Z1211 Encounter for screening for malignant neoplasm of colon: Secondary | ICD-10-CM | POA: Diagnosis not present

## 2022-08-26 DIAGNOSIS — K5730 Diverticulosis of large intestine without perforation or abscess without bleeding: Secondary | ICD-10-CM | POA: Diagnosis not present

## 2022-08-26 DIAGNOSIS — R195 Other fecal abnormalities: Secondary | ICD-10-CM | POA: Diagnosis not present

## 2022-08-26 DIAGNOSIS — K922 Gastrointestinal hemorrhage, unspecified: Secondary | ICD-10-CM | POA: Diagnosis not present

## 2022-08-26 DIAGNOSIS — R194 Change in bowel habit: Secondary | ICD-10-CM | POA: Diagnosis not present

## 2022-08-26 DIAGNOSIS — K6389 Other specified diseases of intestine: Secondary | ICD-10-CM | POA: Diagnosis not present

## 2022-09-22 ENCOUNTER — Inpatient Hospital Stay: Payer: Medicare Other | Attending: Hematology and Oncology | Admitting: Hematology and Oncology

## 2022-09-22 NOTE — Assessment & Plan Note (Deleted)
Left lumpectomy 01/29/2016: IDC with DCIS, 1 cm, margins negative, fibrocystic changes,1/2 lymph nodes positive ( intramammary nodes)T1bN1 stage II a    Adjuvant radiation therapy completed 04/29/16   Treatment Plan: adjuvant antiestrogen therapy with Letrozole 2.5 mg daily started 05/07/16 Osteoporosis: Because of Olivia Gray history of osteoporosis, I recommended that she stop letrozole at this time having completed 5 and half years of therapy.   Letrozole toxicities: Occasional hot flashes but denies any problems   Breast cancer surveillance: 1.  Breast exam 09/21/2021: Benign 2. mammograms 05/27/2022: Benign breast density category B,      Return to clinic in 1 year for follow-up

## 2022-09-23 DIAGNOSIS — R0982 Postnasal drip: Secondary | ICD-10-CM | POA: Diagnosis not present

## 2022-12-27 ENCOUNTER — Other Ambulatory Visit: Payer: Self-pay | Admitting: Cardiology

## 2023-01-26 DIAGNOSIS — Z0000 Encounter for general adult medical examination without abnormal findings: Secondary | ICD-10-CM | POA: Diagnosis not present

## 2023-01-26 DIAGNOSIS — Z23 Encounter for immunization: Secondary | ICD-10-CM | POA: Diagnosis not present

## 2023-01-26 DIAGNOSIS — R7309 Other abnormal glucose: Secondary | ICD-10-CM | POA: Diagnosis not present

## 2023-01-26 DIAGNOSIS — E785 Hyperlipidemia, unspecified: Secondary | ICD-10-CM | POA: Diagnosis not present

## 2023-01-26 DIAGNOSIS — R946 Abnormal results of thyroid function studies: Secondary | ICD-10-CM | POA: Diagnosis not present

## 2023-01-26 DIAGNOSIS — I129 Hypertensive chronic kidney disease with stage 1 through stage 4 chronic kidney disease, or unspecified chronic kidney disease: Secondary | ICD-10-CM | POA: Diagnosis not present

## 2023-01-26 DIAGNOSIS — D709 Neutropenia, unspecified: Secondary | ICD-10-CM | POA: Diagnosis not present

## 2023-01-26 DIAGNOSIS — H3552 Pigmentary retinal dystrophy: Secondary | ICD-10-CM | POA: Diagnosis not present

## 2023-01-26 DIAGNOSIS — N1831 Chronic kidney disease, stage 3a: Secondary | ICD-10-CM | POA: Diagnosis not present

## 2023-01-26 DIAGNOSIS — G4701 Insomnia due to medical condition: Secondary | ICD-10-CM | POA: Diagnosis not present

## 2023-01-26 DIAGNOSIS — Z951 Presence of aortocoronary bypass graft: Secondary | ICD-10-CM | POA: Diagnosis not present

## 2023-02-22 DIAGNOSIS — H6123 Impacted cerumen, bilateral: Secondary | ICD-10-CM | POA: Diagnosis not present

## 2023-02-22 DIAGNOSIS — J3489 Other specified disorders of nose and nasal sinuses: Secondary | ICD-10-CM | POA: Diagnosis not present

## 2023-06-14 DIAGNOSIS — R195 Other fecal abnormalities: Secondary | ICD-10-CM | POA: Diagnosis not present

## 2023-06-14 DIAGNOSIS — G4700 Insomnia, unspecified: Secondary | ICD-10-CM | POA: Diagnosis not present

## 2023-06-18 ENCOUNTER — Other Ambulatory Visit: Payer: Self-pay

## 2023-06-18 MED ORDER — AMLODIPINE BESYLATE 5 MG PO TABS: 5.0000 mg | ORAL_TABLET | Freq: Every day | ORAL | 0 refills | Status: DC

## 2023-07-21 DIAGNOSIS — S52509A Unspecified fracture of the lower end of unspecified radius, initial encounter for closed fracture: Secondary | ICD-10-CM | POA: Diagnosis not present

## 2023-07-21 DIAGNOSIS — M25532 Pain in left wrist: Secondary | ICD-10-CM | POA: Diagnosis not present

## 2023-07-21 DIAGNOSIS — S52501A Unspecified fracture of the lower end of right radius, initial encounter for closed fracture: Secondary | ICD-10-CM | POA: Diagnosis not present

## 2023-07-24 ENCOUNTER — Other Ambulatory Visit: Payer: Self-pay | Admitting: Cardiology

## 2023-07-29 DIAGNOSIS — S52502D Unspecified fracture of the lower end of left radius, subsequent encounter for closed fracture with routine healing: Secondary | ICD-10-CM | POA: Diagnosis not present

## 2023-07-29 DIAGNOSIS — S52501D Unspecified fracture of the lower end of right radius, subsequent encounter for closed fracture with routine healing: Secondary | ICD-10-CM | POA: Diagnosis not present

## 2023-07-29 DIAGNOSIS — M545 Low back pain, unspecified: Secondary | ICD-10-CM | POA: Diagnosis not present

## 2023-07-29 DIAGNOSIS — M546 Pain in thoracic spine: Secondary | ICD-10-CM | POA: Diagnosis not present

## 2023-08-02 DIAGNOSIS — D709 Neutropenia, unspecified: Secondary | ICD-10-CM | POA: Diagnosis not present

## 2023-08-02 DIAGNOSIS — I129 Hypertensive chronic kidney disease with stage 1 through stage 4 chronic kidney disease, or unspecified chronic kidney disease: Secondary | ICD-10-CM | POA: Diagnosis not present

## 2023-08-02 DIAGNOSIS — R7309 Other abnormal glucose: Secondary | ICD-10-CM | POA: Diagnosis not present

## 2023-08-02 DIAGNOSIS — E785 Hyperlipidemia, unspecified: Secondary | ICD-10-CM | POA: Diagnosis not present

## 2023-08-17 ENCOUNTER — Telehealth: Payer: Self-pay | Admitting: Cardiology

## 2023-08-17 ENCOUNTER — Other Ambulatory Visit: Payer: Self-pay | Admitting: Cardiology

## 2023-08-17 MED ORDER — AMLODIPINE BESYLATE 5 MG PO TABS: 5.0000 mg | ORAL_TABLET | Freq: Every day | ORAL | 0 refills | Status: DC

## 2023-08-17 NOTE — Telephone Encounter (Signed)
*  STAT* If patient is at the pharmacy, call can be transferred to refill team.   1. Which medications need to be refilled? (please list name of each medication and dose if known)   amLODipine  (NORVASC ) 5 MG tablet   2. Would you like to learn more about the convenience, safety, & potential cost savings by using the [facility]?   3. Are you open to using the [facility] (Type [facility]. ).  4. Which pharmacy/location (including street and city if local pharmacy) is medication to be sent to?  [id number], [place] - [address]   5. Do they need a 30 day or 90 day supply?   Husband Autry Legions) stated patient only has 1 tablet left.  Patient has appointment scheduled with Dr. Addie Holstein on 5/6.

## 2023-08-17 NOTE — Telephone Encounter (Signed)
 Pt's medication was sent to pt's pharmacy as requested. Confirmation received.

## 2023-08-22 DIAGNOSIS — M546 Pain in thoracic spine: Secondary | ICD-10-CM | POA: Diagnosis not present

## 2023-08-25 DIAGNOSIS — S52502A Unspecified fracture of the lower end of left radius, initial encounter for closed fracture: Secondary | ICD-10-CM | POA: Diagnosis not present

## 2023-08-25 DIAGNOSIS — S52501A Unspecified fracture of the lower end of right radius, initial encounter for closed fracture: Secondary | ICD-10-CM | POA: Diagnosis not present

## 2023-08-27 DIAGNOSIS — M4854XA Collapsed vertebra, not elsewhere classified, thoracic region, initial encounter for fracture: Secondary | ICD-10-CM | POA: Diagnosis not present

## 2023-08-31 ENCOUNTER — Ambulatory Visit: Admitting: Cardiology

## 2023-09-10 ENCOUNTER — Other Ambulatory Visit: Payer: Self-pay | Admitting: Cardiology

## 2023-09-22 DIAGNOSIS — M4854XA Collapsed vertebra, not elsewhere classified, thoracic region, initial encounter for fracture: Secondary | ICD-10-CM | POA: Diagnosis not present

## 2023-09-27 DIAGNOSIS — M25511 Pain in right shoulder: Secondary | ICD-10-CM | POA: Diagnosis not present

## 2023-10-04 DIAGNOSIS — S52502D Unspecified fracture of the lower end of left radius, subsequent encounter for closed fracture with routine healing: Secondary | ICD-10-CM | POA: Diagnosis not present

## 2023-10-04 DIAGNOSIS — S52501D Unspecified fracture of the lower end of right radius, subsequent encounter for closed fracture with routine healing: Secondary | ICD-10-CM | POA: Diagnosis not present

## 2023-11-03 ENCOUNTER — Ambulatory Visit: Attending: Cardiovascular Disease | Admitting: Cardiology

## 2023-11-04 ENCOUNTER — Encounter: Payer: Self-pay | Admitting: Cardiology

## 2023-11-10 DIAGNOSIS — M4854XA Collapsed vertebra, not elsewhere classified, thoracic region, initial encounter for fracture: Secondary | ICD-10-CM | POA: Diagnosis not present

## 2023-11-14 ENCOUNTER — Other Ambulatory Visit: Payer: Self-pay | Admitting: Cardiology

## 2024-01-04 DIAGNOSIS — I10 Essential (primary) hypertension: Secondary | ICD-10-CM | POA: Diagnosis not present

## 2024-01-04 DIAGNOSIS — K5900 Constipation, unspecified: Secondary | ICD-10-CM | POA: Diagnosis not present

## 2024-01-04 DIAGNOSIS — E782 Mixed hyperlipidemia: Secondary | ICD-10-CM | POA: Diagnosis not present

## 2024-01-04 DIAGNOSIS — K5730 Diverticulosis of large intestine without perforation or abscess without bleeding: Secondary | ICD-10-CM | POA: Diagnosis not present

## 2024-01-10 ENCOUNTER — Encounter: Payer: Self-pay | Admitting: Cardiology

## 2024-01-10 ENCOUNTER — Ambulatory Visit: Attending: Cardiology | Admitting: Cardiology

## 2024-01-10 VITALS — BP 130/56 | HR 73 | Ht 64.0 in | Wt 104.6 lb

## 2024-01-10 DIAGNOSIS — E785 Hyperlipidemia, unspecified: Secondary | ICD-10-CM

## 2024-01-10 DIAGNOSIS — I2510 Atherosclerotic heart disease of native coronary artery without angina pectoris: Secondary | ICD-10-CM

## 2024-01-10 DIAGNOSIS — Z7189 Other specified counseling: Secondary | ICD-10-CM | POA: Diagnosis not present

## 2024-01-10 DIAGNOSIS — Z951 Presence of aortocoronary bypass graft: Secondary | ICD-10-CM

## 2024-01-10 DIAGNOSIS — I10 Essential (primary) hypertension: Secondary | ICD-10-CM | POA: Diagnosis not present

## 2024-01-10 DIAGNOSIS — I341 Nonrheumatic mitral (valve) prolapse: Secondary | ICD-10-CM | POA: Diagnosis not present

## 2024-01-10 NOTE — Patient Instructions (Signed)

## 2024-01-10 NOTE — Progress Notes (Signed)
 Cardiology Office Note:  .   Date:  01/17/2024  ID:  Olivia Gray, DOB [birth year], MRN [id number] PCP: Gerome Brunet, DO  [facility] Providers Cardiologist:  Alm Clay, MD     Chief Complaint  Patient presents with   Follow-up    ~2-year follow-up.  This notes worsening exercise intolerance and fatigue   Coronary Artery Disease    No active angina.  She may get short of breath but no chest pain   Cardiac Valve Problem    Echo  in 2023 showed mild MR and MVP.    Patient Profile: .     Olivia Gray is a  89 y.o. female with a PMH noted below who presents here for ~ 2 yr f/u  at the request of Gerome Brunet, [place]. [place] [id number]: CAD s/p post CABG x4 in (SVG to LAD, SVG to D1, SVG to OM1, SVG to R PDA),  Myoview  in 2016-nonischemic MVP - trivial by Echo Echo 08/06/2021: EF 55 to 60%.  No RWMA. Gr1 DD-mild LAE.  Myxomatous MV- Mild late bileaflet MVP with trivial MR, SoV Sclerosis w/o Stenosis, mild AI.  Normal RVP/PAP and RAP H/o Br Ca s/o Chemo.XRT HTN, HDL, prediabetes,  Retinitis pigmentosa of both eyes-Legally Blind Subdural Hematoma - 2/2 fall in December 2020 (stable by Head CT in 2022. Husband found her lying on the floor with clonic movement and right-sided gaze => ? Sz Disorder Head CT showed bilateral subdural hematomas => NSgx OR for bilateral Burr Holes/bilateral ventricular catheters subdural space for SDH drainage  Bradycardia - noted during 07/2019 admission for fall with multiple injuries. SR with PACs - Bigeminy - rates in 40s => Carvedilol  d/c'd & Amlodipine  decreased to 5 mg S/p Nasal Sgx -> 20/01/2022 => significant postnasal drip drainage with rhinorrhea and dysphagia.  Cricopharyngeus muscle dysfunction.  (Laryngeal pharyngeal reflux)      Olivia Gray was last seen on February 02, 2022 as a delayed follow-up.  Doing well from a cardiac standpoint.  She had aged quite a bit and was appearing more ill-appearing.  Completely blind and very hard of hearing.   Borderline elevated blood pressures but stable.  No active cardiac symptoms.  Noted fatigue and deconditioning.  Very sedentary.  No recent falls.  Generalized weakness and fatigue.  Memory loss. => No med changes made.  She was last seen Via Virtual Telephone visit on August 24, 2022 by Jackee Alberts, NP for preop evaluation for colonoscopy and EGD.  She was stable from cardiac perspective with no active anginal symptoms.  Recommended holding aspirin  5 to 7 days prior to the procedure.  Felt to be low risk because no active symptoms and able to achieve more than 4 METS.  Very low risk procedure that does not require preprocedural cardiac evaluation.  Subjective  Discussed the use of AI scribe software for clinical note transcription with the patient, who gave verbal consent to proceed. History was obtained via interview with the patient but also assisted by her husband and then later with speaker phone conversation with their daughter who lives in [place] . History of Present Illness Olivia Gray is an 89 year old female with mitral valve prolapse and coronary artery disease who presents for follow-up regarding her cardiac condition.  She has a history of mitral valve prolapse with mitral regurgitation, last evaluated in 2023. Her last stress test was in 2016, and she has not experienced angina symptoms since her bypass surgery 21 years ago.  Her back pain  is the primary reason for using a wheelchair, although she is capable of walking. No shortness of breath, chest pain, or swelling in the legs. Her right heel hurts but does not swell. No muscle pain in her legs or radiation of back pain down her legs.  She experienced a fall in April 2025, resulting in a broken wrist and a sprained wrist, for which she wore a cast and a brace.  She is dealing with nasal issues, including a runny nose, for which she has been prescribed a nasal mist. She has an upcoming appointment with an ear doctor for further  evaluation.  She reports a decrease in activity level since the COVID-19 pandemic, although she did not contract the virus. She mentions a change in dietary preferences, with a noted weight loss, but maintains an appetite and enjoys milkshakes.  Cardiovascular ROS: positive for - dyspnea on exertion and overall increased activity level/exercise tolerance but no active angina. negative for - chest pain, edema, irregular heartbeat, orthopnea, palpitations, paroxysmal nocturnal dyspnea, rapid heart rate, or syncope or near syncope TIA or amaurosis fugax, claudication.  ROS:  Review of Systems - Negative except noted above-weight loss, less energy.  Recent falls.  Essentially legally blind    Objective   Current Meds CV medications Sig   aspirin  81 MG chewable tablet Chew 1 tablet (81 mg total) by mouth daily.   simvastatin  (ZOCOR ) 10 MG tablet Take 10 mg by mouth daily.   valsartan  (DIOVAN ) 320 MG tablet TAKE 1 TABLET BY MOUTH DAILY TO REPLACE IRBESARTAN    []  amLODipine  (NORVASC ) 5 MG tablet Take 1 tablet (5 mg total) by mouth daily.    Non-CV Medications Sig   aspirin  81 MG chewable tablet Chew 1 tablet (81 mg total) by mouth daily.   D 1000 25 MCG (1000 UT) capsule Take 1,000 Units by mouth daily.   docusate sodium  (COLACE) 250 MG capsule Take 250 mg by mouth daily.   escitalopram  (LEXAPRO ) 10 MG tablet Take 10 mg by mouth daily.   latanoprost (XALATAN) 0.005 % ophthalmic solution    methscopolamine (PAMINE) 2.5 MG TABS tablet Take 2.5 mg by mouth 2 (two) times daily as needed (nasal).   PRESCRIPTION MEDICATION Place 1 drop into both eyes daily. Medication for eye pressure. ( Patient don't know the name)   simvastatin  (ZOCOR ) 10 MG tablet Take 10 mg by mouth daily.   temazepam  (RESTORIL ) 15 MG capsule Take 15 mg by mouth daily.   valsartan  (DIOVAN ) 320 MG tablet TAKE 1 TABLET BY MOUTH DAILY TO REPLACE IRBESARTAN    vitamin B-12 (CYANOCOBALAMIN ) 100 MCG tablet Take 100 mcg by mouth daily.    []  amLODipine  (NORVASC ) 5 MG tablet Take 1 tablet (5 mg total) by mouth daily.    Studies Reviewed: SABRA   EKG Interpretation Date/Time:  Monday January 10 2024 15:13:49 EDT Ventricular Rate:  73 PR Interval:  174 QRS Duration:  96 QT Interval:  404 QTC Calculation: 445 R Axis:   3  Text Interpretation: Sinus rhythm with marked sinus arrhythmia with occasional Premature ventricular complexes Nonspecific T wave abnormality When compared with ECG of 15-Jun-2016 21:59, Premature ventricular complexes NOW PRESENT Confirmed by Anner Lenis ([id number]) on 01/10/2024 3:31:45 PM    No results found for: CHOL, HDL, LDLCALC, LDLDIRECT, TRIG, CHOLHDL Labs from [facility]: TC 160, TG 70, HDL 67, LDL 88, A1c 5.6 (08/02/2023) Results DIAGNOSTIC Echocardiogram: Normal LV size and function with a EF of 55 to 60%.  GR 1 DD.  Mild  LA dilation.  Myxomatous mitral valve with trivial MR.  Mild late MVP of both leaflets.  Mild AoV calcification/sclerosis with no stenosis.  Normal RV size and function with normal RV SP and RAP.  (07/2021) Myoview : Normal study.  No ischemia or infarction.  EF estimated 55%.  (09/26/2014)   Risk Assessment/Calculations:          Physical Exam:   VS:  BP (!) 130/56 (BP Location: Right Arm, Patient Position: Sitting, Cuff Size: Normal)   Pulse 73   Ht 5' 4 (1.626 m)   Wt 104 lb 9.6 oz (47.4 kg)   SpO2 95%   BMI 17.95 kg/m    Wt Readings from Last 3 Encounters:  01/10/24 104 lb 9.6 oz (47.4 kg)  05/18/22 119 lb 14.4 oz (54.4 kg)  02/02/22 121 lb 6.4 oz (55.1 kg)      GEN: Chronically ill, mildly frail appearing elderly woman.  Legally blind.  Very hard of hearing.  Nontoxic.  No acute distress.  Very poor insight and understanding NECK: No JVD; No carotid bruits CARDIAC: RRR with occasional ectopy, Distant heart sounds mostly normal S1, S2; 2/6 SEM at RUSB, cannot exclude soft HSM but no midsystolic click. RESPIRATORY:  Clear to auscultation without rales, wheezing  or rhonchi ; nonlabored, good air movement. ABDOMEN: Soft, non-tender, non-distended EXTREMITIES: Trivial ankle edema; No deformity      ASSESSMENT AND PLAN: .    Problem List Items Addressed This Visit       Cardiology Problems   CAD, multiple vessel (Chronic)   Multivessel CAD with CABG x 4 in 2004.  Most recent Myoview  was in 2016. No active anginal symptoms at this point just some exercise intolerance and fatigue but she is relatively sedentary and therefore not likely to have symptoms. - No plans for ischemic evaluation in the absence of symptoms. - Continue amlodipine  5 daily for antianginal benefit along with valsartan  320 mg for blood pressure and afterload reduction. - Continue 81 mg aspirin  daily  (okay to hold 5 to 7 days preop for surgeries or procedures) - Continue current dose of simvastatin  10 mg daily.       Relevant Orders   EKG 12-Lead (Completed)   Essential hypertension (Chronic)   Well-controlled blood pressure on combination of amlodipine  5 mg daily and Diovan  320 mg daily.      Relevant Orders   EKG 12-Lead (Completed)   Hyperlipidemia LDL goal <70 (Chronic)   LDL 88 is not at goal, but given her issues.  Issues with memory loss and other comorbidities, acceptable.  I have previously recommended switching from simvastatin  to rosuvastatin but that did not happen.  She is tolerating simvastatin . If lipids continue to go up, would again recommend switching to rosuvastatin at same dose.      Mitral valve prolapse - Primary (Chronic)   Mitral valve prolapse with mild mitral regurgitation, asymptomatic, no heart failure or dyspnea. Surveillance echocardiogram deferred due to age and lack of symptoms. - Defer echocardiogram unless symptoms such as dyspnea or heart failure develop.       Relevant Orders   EKG 12-Lead (Completed)     Other   Goals of care, counseling/discussion (Chronic)   Very long discussion about the concept of follow-up surveillance  testing-echocardiogram and stress test --  that led to a telephone call with her daughter.  Based on the assumption that she would not be interested in surgical repair of mitral valve (or for that matter invasive procedures) in  the absence of symptoms, surveillance testing is not warranted based on her advanced age and comorbidities.  At least 15 minutes was spent discussing this-goals of care.      S/P CABG x 4 (Chronic)   Avoid further ischemic testing in the absence of symptoms based on advanced age.  See shared decision making discussion and goals of care section.              Follow-Up: Return in about 1 year (around 01/09/2025) for [employer].  I spent 57 minutes in the care of Olivia Gray today including reviewing outside labs from PCP via [facility] (1 minute), reviewing studies (most recent echo and Myoview  results reviewed-5 minutes), face to face time discussing treatment options (35 minutes), reviewing records from my last note 2 years ago and telephone note from APP  (5 minutes), 11 minutes dictating, and documenting in the encounter.      Signed, Alm MICAEL Clay, MD, MS Alm Clay, M.D., M.S. Interventional Cardiologist  [facility] Pager # [phone]

## 2024-01-12 ENCOUNTER — Other Ambulatory Visit: Payer: Self-pay | Admitting: Cardiology

## 2024-01-12 DIAGNOSIS — J310 Chronic rhinitis: Secondary | ICD-10-CM | POA: Diagnosis not present

## 2024-01-12 DIAGNOSIS — H6123 Impacted cerumen, bilateral: Secondary | ICD-10-CM | POA: Diagnosis not present

## 2024-01-17 ENCOUNTER — Encounter: Payer: Self-pay | Admitting: Cardiology

## 2024-01-17 DIAGNOSIS — Z7189 Other specified counseling: Secondary | ICD-10-CM | POA: Insufficient documentation

## 2024-01-17 NOTE — Assessment & Plan Note (Signed)
 Avoid further ischemic testing in the absence of symptoms based on advanced age.  See shared decision making discussion and goals of care section.

## 2024-01-17 NOTE — Assessment & Plan Note (Addendum)
 Multivessel CAD with CABG x 4 in 2004.  Most recent Myoview  was in 2016. No active anginal symptoms at this point just some exercise intolerance and fatigue but she is relatively sedentary and therefore not likely to have symptoms. - No plans for ischemic evaluation in the absence of symptoms. - Continue amlodipine  5 daily for antianginal benefit along with valsartan  320 mg for blood pressure and afterload reduction. - Continue 81 mg aspirin  daily  (okay to hold 5 to 7 days preop for surgeries or procedures) - Continue current dose of simvastatin  10 mg daily.

## 2024-01-17 NOTE — Assessment & Plan Note (Signed)
 Mitral valve prolapse with mild mitral regurgitation, asymptomatic, no heart failure or dyspnea. Surveillance echocardiogram deferred due to age and lack of symptoms. - Defer echocardiogram unless symptoms such as dyspnea or heart failure develop.

## 2024-01-17 NOTE — Assessment & Plan Note (Signed)
 LDL 88 is not at goal, but given her issues.  Issues with memory loss and other comorbidities, acceptable.  I have previously recommended switching from simvastatin  to rosuvastatin but that did not happen.  She is tolerating simvastatin . If lipids continue to go up, would again recommend switching to rosuvastatin at same dose.

## 2024-01-17 NOTE — Assessment & Plan Note (Signed)
 Well-controlled blood pressure on combination of amlodipine  5 mg daily and Diovan  320 mg daily.

## 2024-01-17 NOTE — Assessment & Plan Note (Signed)
 Very long discussion about the concept of follow-up surveillance testing-echocardiogram and stress test --  that led to a telephone call with her daughter.  Based on the assumption that she would not be interested in surgical repair of mitral valve (or for that matter invasive procedures) in the absence of symptoms, surveillance testing is not warranted based on her advanced age and comorbidities.  At least 15 minutes was spent discussing this-goals of care.

## 2024-01-24 ENCOUNTER — Other Ambulatory Visit: Payer: Self-pay | Admitting: Cardiology

## 2024-01-25 DIAGNOSIS — R7309 Other abnormal glucose: Secondary | ICD-10-CM | POA: Diagnosis not present

## 2024-01-25 DIAGNOSIS — E559 Vitamin D deficiency, unspecified: Secondary | ICD-10-CM | POA: Diagnosis not present

## 2024-01-25 DIAGNOSIS — E538 Deficiency of other specified B group vitamins: Secondary | ICD-10-CM | POA: Diagnosis not present

## 2024-01-25 DIAGNOSIS — M810 Age-related osteoporosis without current pathological fracture: Secondary | ICD-10-CM | POA: Diagnosis not present

## 2024-01-25 DIAGNOSIS — E785 Hyperlipidemia, unspecified: Secondary | ICD-10-CM | POA: Diagnosis not present

## 2024-02-01 DIAGNOSIS — H3552 Pigmentary retinal dystrophy: Secondary | ICD-10-CM | POA: Diagnosis not present

## 2024-02-01 DIAGNOSIS — Z23 Encounter for immunization: Secondary | ICD-10-CM | POA: Diagnosis not present

## 2024-02-01 DIAGNOSIS — Z0000 Encounter for general adult medical examination without abnormal findings: Secondary | ICD-10-CM | POA: Diagnosis not present

## 2024-02-01 DIAGNOSIS — Z951 Presence of aortocoronary bypass graft: Secondary | ICD-10-CM | POA: Diagnosis not present

## 2024-02-01 DIAGNOSIS — N1831 Chronic kidney disease, stage 3a: Secondary | ICD-10-CM | POA: Diagnosis not present

## 2024-02-01 DIAGNOSIS — I2510 Atherosclerotic heart disease of native coronary artery without angina pectoris: Secondary | ICD-10-CM | POA: Diagnosis not present

## 2024-02-01 DIAGNOSIS — I129 Hypertensive chronic kidney disease with stage 1 through stage 4 chronic kidney disease, or unspecified chronic kidney disease: Secondary | ICD-10-CM | POA: Diagnosis not present

## 2024-02-16 ENCOUNTER — Other Ambulatory Visit: Payer: Self-pay | Admitting: Family Medicine

## 2024-02-16 DIAGNOSIS — Z1231 Encounter for screening mammogram for malignant neoplasm of breast: Secondary | ICD-10-CM

## 2024-02-18 ENCOUNTER — Other Ambulatory Visit: Payer: Self-pay | Admitting: Family Medicine

## 2024-02-18 DIAGNOSIS — N63 Unspecified lump in unspecified breast: Secondary | ICD-10-CM

## 2024-02-23 ENCOUNTER — Ambulatory Visit
Admission: RE | Admit: 2024-02-23 | Discharge: 2024-02-23 | Disposition: A | Discharge status: Home / Self Care | Source: Ambulatory Visit | Attending: Family Medicine | Admitting: Family Medicine

## 2024-02-23 ENCOUNTER — Inpatient Hospital Stay: Admission: RE | Admit: 2024-02-23 | Discharge: 2024-02-23 | Attending: Family Medicine | Admitting: Family Medicine

## 2024-02-23 DIAGNOSIS — N63 Unspecified lump in unspecified breast: Secondary | ICD-10-CM

## 2024-02-23 DIAGNOSIS — N644 Mastodynia: Secondary | ICD-10-CM | POA: Diagnosis not present

## 2024-02-23 DIAGNOSIS — R928 Other abnormal and inconclusive findings on diagnostic imaging of breast: Secondary | ICD-10-CM | POA: Diagnosis not present
